# Patient Record
Sex: Male | Born: 1970 | Race: Black or African American | Hispanic: No | Smoking: Current every day smoker
Health system: Southern US, Community
[De-identification: ages and names within clinical notes are randomized; demographics above are authoritative.]

## PROBLEM LIST (undated history)

## (undated) DIAGNOSIS — K219 Gastro-esophageal reflux disease without esophagitis: Secondary | ICD-10-CM

## (undated) DIAGNOSIS — E7800 Pure hypercholesterolemia, unspecified: Secondary | ICD-10-CM

## (undated) DIAGNOSIS — Z7289 Other problems related to lifestyle: Secondary | ICD-10-CM

## (undated) DIAGNOSIS — I6529 Occlusion and stenosis of unspecified carotid artery: Secondary | ICD-10-CM

## (undated) DIAGNOSIS — I10 Essential (primary) hypertension: Secondary | ICD-10-CM

## (undated) DIAGNOSIS — I639 Cerebral infarction, unspecified: Secondary | ICD-10-CM

## (undated) DIAGNOSIS — N289 Disorder of kidney and ureter, unspecified: Secondary | ICD-10-CM

## (undated) DIAGNOSIS — Z789 Other specified health status: Secondary | ICD-10-CM

## (undated) DIAGNOSIS — Z720 Tobacco use: Secondary | ICD-10-CM

## (undated) DIAGNOSIS — R569 Unspecified convulsions: Secondary | ICD-10-CM

## (undated) DIAGNOSIS — F1090 Alcohol use, unspecified, uncomplicated: Secondary | ICD-10-CM

## (undated) HISTORY — DX: Gastro-esophageal reflux disease without esophagitis: K21.9

## (undated) HISTORY — PX: NO PAST SURGERIES: SHX2092

## (undated) HISTORY — DX: Occlusion and stenosis of unspecified carotid artery: I65.29

---

## 2017-06-29 ENCOUNTER — Emergency Department
Admission: EM | Admit: 2017-06-29 | Discharge: 2017-06-29 | Disposition: A | Discharge status: Home / Self Care | Payer: Self-pay | Attending: Emergency Medicine | Admitting: Emergency Medicine

## 2017-06-29 ENCOUNTER — Encounter: Payer: Self-pay | Admitting: Emergency Medicine

## 2017-06-29 ENCOUNTER — Emergency Department: Payer: Self-pay

## 2017-06-29 DIAGNOSIS — R519 Headache, unspecified: Secondary | ICD-10-CM

## 2017-06-29 DIAGNOSIS — R569 Unspecified convulsions: Secondary | ICD-10-CM | POA: Insufficient documentation

## 2017-06-29 DIAGNOSIS — R51 Headache: Secondary | ICD-10-CM | POA: Insufficient documentation

## 2017-06-29 HISTORY — DX: Pure hypercholesterolemia, unspecified: E78.00

## 2017-06-29 HISTORY — DX: Essential (primary) hypertension: I10

## 2017-06-29 HISTORY — DX: Unspecified convulsions: R56.9

## 2017-06-29 HISTORY — DX: Cerebral infarction, unspecified: I63.9

## 2017-06-29 LAB — BASIC METABOLIC PANEL
ANION GAP: 12 (ref 5–15)
BUN: 15 mg/dL (ref 6–20)
CHLORIDE: 102 mmol/L (ref 101–111)
CO2: 23 mmol/L (ref 22–32)
Calcium: 9.7 mg/dL (ref 8.9–10.3)
Creatinine, Ser: 1.34 mg/dL — ABNORMAL HIGH (ref 0.61–1.24)
GFR calc Af Amer: >60 mL/min (ref >60)
GFR calc non Af Amer: >60 mL/min (ref >60)
GLUCOSE: 186 mg/dL — AB (ref 65–99)
POTASSIUM: 3.7 mmol/L (ref 3.5–5.1)
Sodium: 137 mmol/L (ref 135–145)

## 2017-06-29 LAB — URINALYSIS, COMPLETE (UACMP) WITH MICROSCOPIC
BACTERIA UA: NONE SEEN
BILIRUBIN URINE: NEGATIVE
Glucose, UA: NEGATIVE mg/dL
HGB URINE DIPSTICK: NEGATIVE
Ketones, ur: NEGATIVE mg/dL
LEUKOCYTES UA: NEGATIVE
Nitrite: NEGATIVE
PROTEIN: NEGATIVE mg/dL
SPECIFIC GRAVITY, URINE: 1.019 (ref 1.005–1.030)
pH: 5 (ref 5.0–8.0)

## 2017-06-29 LAB — CBC
HEMATOCRIT: 36.7 % — AB (ref 40.0–52.0)
HEMOGLOBIN: 13.2 g/dL (ref 13.0–18.0)
MCH: 33.6 pg (ref 26.0–34.0)
MCHC: 36 g/dL (ref 32.0–36.0)
MCV: 93.3 fL (ref 80.0–100.0)
Platelets: 279 10*3/uL (ref 150–440)
RBC: 3.93 MIL/uL — ABNORMAL LOW (ref 4.40–5.90)
RDW: 14 % (ref 11.5–14.5)
WBC: 8 10*3/uL (ref 3.8–10.6)

## 2017-06-29 LAB — GLUCOSE, CAPILLARY: GLUCOSE-CAPILLARY: 186 mg/dL — AB (ref 65–99)

## 2017-06-29 LAB — TROPONIN I

## 2017-06-29 MED ORDER — SODIUM CHLORIDE 0.9 % IV BOLUS (SEPSIS)
1000.0000 mL | Freq: Once | INTRAVENOUS | Status: AC
  Administered 2017-06-29: 1000 mL via INTRAVENOUS

## 2017-06-29 MED ORDER — ASPIRIN 81 MG PO TABS: 81.0000 mg | ORAL_TABLET | Freq: Every day | ORAL | 0 refills | Status: DC

## 2017-06-29 MED ORDER — ASPIRIN 81 MG PO CHEW
81.0000 mg | CHEWABLE_TABLET | Freq: Once | ORAL | Status: AC
  Administered 2017-06-29: 81 mg via ORAL
  Filled 2017-06-29: qty 1

## 2017-06-29 MED ORDER — ASPIRIN 81 MG PO CHEW: 324.0000 mg | CHEWABLE_TABLET | Freq: Once | ORAL | Status: DC

## 2017-06-29 MED ORDER — ACETAMINOPHEN 500 MG PO TABS
1000.0000 mg | ORAL_TABLET | Freq: Once | ORAL | Status: AC
  Administered 2017-06-29: 1000 mg via ORAL
  Filled 2017-06-29: qty 2

## 2017-06-29 NOTE — ED Triage Notes (Signed)
Pt c/o increasing seizures over last 2 weeks.  having 4-5 per week, normally 1-2.  Seizures since has strokes back in October last year.  C/o intermittent headaches over last 2 weeks as well.  Has numbness to left hand since stroke.  Reports headaches as generalized. Has had syncope right after stroke but not recently. Ambulatory to triage. NAD. C/o severe fatigue and always being tired.

## 2017-06-29 NOTE — Discharge Instructions (Signed)
Increase you Keppra from 500 mg twice a day to 500mg  in the morning and  in the evening. Follow up with Neurology as soon as possible. Above there are two of our Neurologists for follow up. You must be on a baby aspirin daily for stroke prevention. Make sure to take it every day. Return to the ER for new or multiple seizures, or any signs of stroke including facial droop, slurred speech, difficulty finding words, or we kness or numbness of one side of your body.

## 2017-06-29 NOTE — ED Notes (Signed)
Pt in CT.

## 2017-06-29 NOTE — ED Provider Notes (Signed)
[facility] Emergency Department Provider Note  ____________________________________________  Time seen: Approximately 11:46 AM  I have reviewed the triage vital signs and the nursing notes.   HISTORY  Chief Complaint Headache   HPI Gary [name] is a 46 y.o. 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 Patient reports that he was diagnosed with a stroke back in 07/2016 in [place]. He has moved to [place] has not established care here. He reports that he has weekly episodes of partial seizure which he describes as drooling into the left side of the mouth and inability to control his left limbs. He reports 4-5 episodes a week however this has been ongoing since he had the stroke. No increase in the number of seizures. He has never followed with neurology. For the last 2 weeks he has been having headaches which he describes as pressure located diffusely in his head, currently 5 out of 10, constant, nonradiating. He denies new acute neurological symptoms such as facial droop, slurred speech, changes in vision, worsening LUE weakness or new unilateral weakness. Patient is not on aspirin or Plavix for stroke prevention. He does have a history of hypertension, and hyperlipidemia. He endorses compliance with his medications for those conditions.  Past Medical History:  Diagnosis Date  . Hypercholesteremia   . Hypertension   . Seizures (HCC)   . Stroke [facility])     There are no active problems to display for this patient.   History reviewed. No pertinent surgical history.  Prior to Admission medications   Medication Sig Start Date End Date Taking? Authorizing Provider  atenolol (TENORMIN) 25 MG tablet Take 25 mg by mouth daily.   Yes [provider]  levETIRAcetam (KEPPRA) 1000 MG tablet Take 1,000 mg by mouth 2 (two) times daily.   Yes [provider]    lisinopril-hydrochlorothiazide (PRINZIDE,ZESTORETIC) 20-12.5 MG tablet Take 1 tablet by mouth daily.   Yes [provider]  lovastatin (MEVACOR) 20 MG tablet Take 20 mg by mouth at bedtime.   Yes [provider]  aspirin 81 MG tablet Take 1 tablet (81 mg total) by mouth daily. 06/29/17   Nita Sickle, MD    Allergies Patient has no known allergies.  History reviewed. No pertinent family history.  Social History Social History  Substance Use Topics  . Smoking status: Current Some Day Smoker  . Smokeless tobacco: Never Used  . Alcohol use Yes    Review of Systems  Constitutional: Negative for fever. Eyes: Negative for visual changes. ENT: Negative for sore throat. Neck: No neck pain  Cardiovascular: Negative for chest pain. Respiratory: Negative for shortness of breath. Gastrointestinal: Negative for abdominal pain, vomiting or diarrhea. Genitourinary: Negative for dysuria. Musculoskeletal: Negative for back pain. Skin: Negative for rash. Neurological: Negative for weakness or numbness. + HA and seizures Psych: No SI or HI  ____________________________________________   PHYSICAL EXAM:  VITAL SIGNS: ED Triage Vitals  Enc Vitals Group     BP 06/29/17 1040 (!) 155/100     Pulse Rate 06/29/17 1040 98     Resp 06/29/17 1040 16     Temp 06/29/17 1040 98.1 F (36.7 C)     Temp Source 06/29/17 1040 Oral     SpO2 06/29/17 1040 100 %     Weight 06/29/17 1041 223 lb (101.2 kg)     Height 06/29/17 1041  (1.803 m)     Head Circumference --  Peak Flow --      Pain Score 06/29/17 1039 7     Pain Loc --      Pain Edu? --      Excl. in GC? --     Constitutional: Alert and oriented. Well appearing and in no apparent distress. HEENT:      Head: Normocephalic and atraumatic.         Eyes: Conjunctivae are normal. Sclera is non-icteric.       Mouth/Throat: Mucous membranes are moist.       Neck: Supple with no signs of  meningismus. Cardiovascular: Regular rate and rhythm. No murmurs, gallops, or rubs. 2+ symmetrical distal pulses are present in all extremities. No JVD. Respiratory: Normal respiratory effort. Lungs are clear to auscultation bilaterally. No wheezes, crackles, or rhonchi.  Gastrointestinal: Soft, non tender, and non distended with positive bowel sounds. No rebound or guarding. Genitourinary: No CVA tenderness. Musculoskeletal: Nontender with normal range of motion in all extremities. No edema, cyanosis, or erythema of extremities. Neurologic: Normal speech and language. A & O x3, PERRL, EOMI, no nystagmus, CN II-XII intact, motor testing reveals good tone and bulk throughout. There is no evidence of pronator drift or dysmetria. Muscle strength is 5/5 on the RUE, RLE and LLE, 4+/5 on LUE. Sensory examination is intact. Gait is normal. Skin: Skin is warm, dry and intact. No rash noted. Psychiatric: Mood and affect are normal. Speech and behavior are normal.  ____________________________________________   LABS (all labs ordered are listed, but only abnormal results are displayed)  Labs Reviewed  BASIC METABOLIC PANEL - Abnormal; Notable for the following:       Result Value   Glucose, Bld 186 (*)    Creatinine, Ser 1.34 (*)    All other components within normal limits  CBC - Abnormal; Notable for the following:    RBC 3.93 (*)    HCT 36.7 (*)    All other components within normal limits  URINALYSIS, COMPLETE (UACMP) WITH MICROSCOPIC - Abnormal; Notable for the following:    Color, Urine YELLOW (*)    APPearance CLEAR (*)    Squamous Epithelial / LPF 0-5 (*)    All other components within normal limits  GLUCOSE, CAPILLARY - Abnormal; Notable for the following:    Glucose-Capillary 186 (*)    All other components within normal limits  TROPONIN I  LEVETIRACETAM LEVEL  CBG MONITORING, ED   ____________________________________________  EKG  ED ECG REPORT I, Nita Sickle, the  attending physician, personally viewed and interpreted this ECG.  Normal sinus rhythm, rate of 98, normal intervals, normal axis, no ST elevations or depressions, T-wave flattening in inferior lateral leads.no prior for comparison. ____________________________________________  RADIOLOGY  Head CT:  No acute intracranial abnormality.  Old infarct. ____________________________________________   PROCEDURES  Procedure(s) performed: None Procedures Critical Care performed:  None ____________________________________________   INITIAL IMPRESSION / ASSESSMENT AND PLAN / ED COURSE  46 y.o. 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 HA have been increasing in frequency for 2 weeks. Not on blood thinners. Neuro intact other than mild weakness of the LUE which is chronic. No thunderclap HA. Will get head CT to rule out new subacute stroke since sx have been ongoing for 2 weeks. Will check Keppra levels and increase Keppra from  BID to  qAM and  qPM. Will refer to Neurology. Discuss with patient that he must be at least on a daily ASA  for stroke prevention.    _________________________ 1:28 PM on 06/29/2017 -----------------------------------------  patient remains extremely well appearing. Keppra level is pending. Patients can be referred to neurology. Discussed with patient the importance of taking a baby aspirin every day for stroke prevention. Remaining of labs and UA with no acute findings. Recommended he return to the emergency room if patient has new seizures, more frequent seizures, or any signs or symptoms of stroke. I'll increase patient's Keppra per above.  Pertinent labs & imaging results that were available during my care of the patient were reviewed by me and considered in my medical decision making (see chart for details).    ____________________________________________   FINAL CLINICAL  IMPRESSION(S) / ED DIAGNOSES  Final diagnoses:  Acute nonintractable headache, unspecified headache type  Partial seizures (HCC)      NEW MEDICATIONS STARTED DURING THIS VISIT:  New Prescriptions   ASPIRIN 81 MG TABLET    Take 1 tablet (81 mg total) by mouth daily.     Note:  This document was prepared using Dragon voice recognition software and may include unintentional dictation errors.    Don Perking, [place], MD 06/29/17 1330

## 2017-07-02 LAB — LEVETIRACETAM LEVEL: LEVETIRACETAM: NOT DETECTED ug/mL (ref 10.0–40.0)

## 2017-10-22 ENCOUNTER — Ambulatory Visit: Payer: Self-pay

## 2017-10-24 ENCOUNTER — Ambulatory Visit: Payer: Self-pay | Admitting: Family Medicine

## 2017-10-24 VITALS — BP 134/97 | HR 92 | Temp 97.8°F | Wt 238.8 lb

## 2017-10-24 DIAGNOSIS — Z87898 Personal history of other specified conditions: Secondary | ICD-10-CM

## 2017-10-24 DIAGNOSIS — K6289 Other specified diseases of anus and rectum: Secondary | ICD-10-CM

## 2017-10-24 DIAGNOSIS — R42 Dizziness and giddiness: Secondary | ICD-10-CM

## 2017-10-24 DIAGNOSIS — R531 Weakness: Secondary | ICD-10-CM

## 2017-10-24 DIAGNOSIS — R0602 Shortness of breath: Secondary | ICD-10-CM

## 2017-10-24 DIAGNOSIS — R6882 Decreased libido: Secondary | ICD-10-CM

## 2017-10-24 DIAGNOSIS — R51 Headache: Secondary | ICD-10-CM

## 2017-10-24 DIAGNOSIS — E785 Hyperlipidemia, unspecified: Secondary | ICD-10-CM

## 2017-10-24 DIAGNOSIS — R599 Enlarged lymph nodes, unspecified: Secondary | ICD-10-CM

## 2017-10-24 DIAGNOSIS — I10 Essential (primary) hypertension: Secondary | ICD-10-CM

## 2017-10-24 DIAGNOSIS — R55 Syncope and collapse: Secondary | ICD-10-CM

## 2017-10-24 DIAGNOSIS — Z09 Encounter for follow-up examination after completed treatment for conditions other than malignant neoplasm: Secondary | ICD-10-CM

## 2017-10-24 DIAGNOSIS — R519 Headache, unspecified: Secondary | ICD-10-CM

## 2017-10-24 DIAGNOSIS — K219 Gastro-esophageal reflux disease without esophagitis: Secondary | ICD-10-CM

## 2017-10-24 MED ORDER — OMEPRAZOLE 20 MG PO CPDR: 20.0000 mg | DELAYED_RELEASE_CAPSULE | Freq: Every day | ORAL | 0 refills | Status: DC

## 2017-10-24 NOTE — Progress Notes (Signed)
  Patient: Gary [name] Male    DOB: 1970/11/27   47 y.o.   MRN: [id number] Visit Date: 10/24/2017  Today's Provider: [facility]   Chief Complaint  Patient presents with  . Establish Care  . Pain    has stroke symptoms   Subjective:   HPI  Patient is here today to establish care with PCP.   No Known Allergies Previous Medications   ASPIRIN 81 MG TABLET    Take 1 tablet (81 mg total) by mouth daily.   ATENOLOL (TENORMIN) 25 MG TABLET    Take 25 mg by mouth daily.   LEVETIRACETAM (KEPPRA) 1000 MG TABLET    Take 1,000 mg by mouth 2 (two) times daily.   LISINOPRIL-HYDROCHLOROTHIAZIDE (PRINZIDE,ZESTORETIC) 20-12.5 MG TABLET    Take 1 tablet by mouth daily.   LOVASTATIN (MEVACOR) 20 MG TABLET    Take 20 mg by mouth at bedtime.    Review of Systems  Social History   Tobacco Use  . Smoking status: Current Some Day Smoker    Packs/day: 0.10  . Smokeless tobacco: Never Used  Substance Use Topics  . Alcohol use: Yes    Comment: occasionally   Objective:   BP (!) 134/97 (BP Location: Left Arm, Patient Position: Sitting, Cuff Size: Large)   Pulse 92   Temp 97.8 F (36.6 C)   Wt 238 lb 12.8 oz (108.3 kg)   BMI 33.31 kg/m   Physical Exam     Assessment & Plan:   1. History of seizures Stable. Continue Keppra as directed.  - CBC; Future - Comprehensive metabolic panel; Future - TSH - HgB A1c  2. Left-sided weakness Referral to Neurology. - CBC; Future  3. Dizziness Probable from history of stroke. Counseled to ambulate slowly. Neurology referral.  - CBC; Future  4. Shortness of breath - CBC; Future  5. Essential hypertension BP is 134/97 today. Continue Zestoretic and Atenolol as directed.  - CBC; Future  6. Hyperlipidemia, unspecified hyperlipidemia type Continue Mevacor as directed. - Lipid Profile  7. Bilateral headaches Probable secondary to seizures. Continue OTC meds for headache as needed. Referral to Neurology.  8.  Gastroesophageal reflux disease, esophagitis presence not specified Rx for Prilosec to pharmacy.  9. Syncope, unspecified syncope type See # 3.   10. Rectal discomfort Referral to GI. - PS/A  11. Decreased libido Monitor.  12. Swollen lymph nodes Will re-assess at next appointment. Monitor.  13. Follow up Follow up in 2 weeks for OV and to review labs.     [facility]   [facility]

## 2017-10-31 ENCOUNTER — Ambulatory Visit: Payer: Self-pay | Admitting: Ophthalmology

## 2017-11-01 ENCOUNTER — Ambulatory Visit: Payer: Self-pay | Admitting: Pharmacy Technician

## 2017-11-01 ENCOUNTER — Other Ambulatory Visit: Payer: Self-pay

## 2017-11-01 DIAGNOSIS — I152 Hypertension secondary to endocrine disorders: Secondary | ICD-10-CM

## 2017-11-01 DIAGNOSIS — R569 Unspecified convulsions: Secondary | ICD-10-CM

## 2017-11-01 DIAGNOSIS — I10 Essential (primary) hypertension: Secondary | ICD-10-CM

## 2017-11-01 DIAGNOSIS — Z79899 Other long term (current) drug therapy: Secondary | ICD-10-CM

## 2017-11-01 DIAGNOSIS — E1159 Type 2 diabetes mellitus with other circulatory complications: Secondary | ICD-10-CM

## 2017-11-01 MED ORDER — LOVASTATIN 20 MG PO TABS: 20.0000 mg | ORAL_TABLET | Freq: Every day | ORAL | 6 refills | Status: DC

## 2017-11-01 MED ORDER — LEVETIRACETAM 1000 MG PO TABS: 1000.0000 mg | ORAL_TABLET | Freq: Two times a day (BID) | ORAL | 6 refills | Status: DC

## 2017-11-01 MED ORDER — LISINOPRIL-HYDROCHLOROTHIAZIDE 20-12.5 MG PO TABS: 1.0000 | ORAL_TABLET | Freq: Every day | ORAL | 6 refills | Status: DC

## 2017-11-01 MED ORDER — ASPIRIN 81 MG PO TABS: 81.0000 mg | ORAL_TABLET | Freq: Every day | ORAL | 0 refills | Status: DC

## 2017-11-01 MED ORDER — ATENOLOL 25 MG PO TABS: 25.0000 mg | ORAL_TABLET | Freq: Every day | ORAL | 6 refills | Status: DC

## 2017-11-01 NOTE — Progress Notes (Signed)
Completed [facility] application and contract.  Patient agreed to all terms of the [facility] contract.    Patient approved to receive medication assistance at [facility] through 2019, as long as eligibility criteria continues to be met.    Provided patient with Civil engineer, contracting based on his particular needs.    Patient inquiring about [facility] filling medications for Aspirin, Atenolol, Keppra Lovastatin & Lisinopril-HCTZ.  Explained that we needed prescriptions.  Requested prescriptions from [facility].  Referred patient for MTM.  [facility]

## 2017-11-07 ENCOUNTER — Other Ambulatory Visit: Payer: Self-pay

## 2017-11-07 ENCOUNTER — Encounter: Payer: Self-pay | Admitting: Adult Health Nurse Practitioner

## 2017-11-07 ENCOUNTER — Ambulatory Visit: Payer: Medicaid Other | Admitting: Adult Health Nurse Practitioner

## 2017-11-07 VITALS — BP 155/94 | HR 82 | Temp 98.3°F | Wt 240.6 lb

## 2017-11-07 DIAGNOSIS — I10 Essential (primary) hypertension: Secondary | ICD-10-CM

## 2017-11-07 DIAGNOSIS — E1159 Type 2 diabetes mellitus with other circulatory complications: Secondary | ICD-10-CM

## 2017-11-07 DIAGNOSIS — K122 Cellulitis and abscess of mouth: Secondary | ICD-10-CM | POA: Insufficient documentation

## 2017-11-07 DIAGNOSIS — R569 Unspecified convulsions: Secondary | ICD-10-CM

## 2017-11-07 DIAGNOSIS — E785 Hyperlipidemia, unspecified: Secondary | ICD-10-CM | POA: Insufficient documentation

## 2017-11-07 DIAGNOSIS — R42 Dizziness and giddiness: Secondary | ICD-10-CM

## 2017-11-07 DIAGNOSIS — Z87898 Personal history of other specified conditions: Secondary | ICD-10-CM

## 2017-11-07 DIAGNOSIS — R0602 Shortness of breath: Secondary | ICD-10-CM

## 2017-11-07 DIAGNOSIS — R531 Weakness: Secondary | ICD-10-CM

## 2017-11-07 DIAGNOSIS — Z8673 Personal history of transient ischemic attack (TIA), and cerebral infarction without residual deficits: Secondary | ICD-10-CM

## 2017-11-07 MED ORDER — ATENOLOL 50 MG PO TABS: 50.0000 mg | ORAL_TABLET | Freq: Every day | ORAL | 3 refills | Status: DC

## 2017-11-07 MED ORDER — ATORVASTATIN CALCIUM 20 MG PO TABS
20.0000 mg | ORAL_TABLET | Freq: Every day | ORAL | 3 refills | Status: DC
Start: 2017-11-07 — End: 2018-11-19

## 2017-11-07 MED ORDER — OMEPRAZOLE 20 MG PO CPDR: 20.0000 mg | DELAYED_RELEASE_CAPSULE | Freq: Every day | ORAL | 0 refills | Status: DC

## 2017-11-07 MED ORDER — LISINOPRIL-HYDROCHLOROTHIAZIDE 20-12.5 MG PO TABS: 1.0000 | ORAL_TABLET | Freq: Every day | ORAL | 6 refills | Status: DC

## 2017-11-07 MED ORDER — LEVETIRACETAM 1000 MG PO TABS: 1000.0000 mg | ORAL_TABLET | Freq: Two times a day (BID) | ORAL | 6 refills | Status: DC

## 2017-11-07 NOTE — Addendum Note (Signed)
Addended by: Laray Corbit D on: 11/07/2017 06:18 PM   Modules accepted: Orders  

## 2017-11-07 NOTE — Addendum Note (Signed)
Addended by: Dustin FlockMAYNOR, Takeela Peil D on: 11/07/2017 06:18 PM   Modules accepted: Orders

## 2017-11-07 NOTE — Progress Notes (Signed)
  Patient: Gary [name] Male    DOB: Oct 18, 1970   47 y.o.   MRN: [id number] Visit Date: 11/07/2017  Today's Provider: Jacelyn Pieah Doles-Johnson, NP   No chief complaint on file.  Subjective:    HPI  Pt has hx of 3 stokes in Oct 2017. None since 2017.  Has residual numbness to his left hand.  Has chronic pain in his head, legs and back- unable to maintain one position for long periods of time.  Spends most of his time lying down.  No recent stroke symptoms.   Pt states that he has small seizures- last one a month ago.  Pt states that during his seizures he loses control of his mouth and L hand- feels as if he is going to pass out- he has passed out before.     No Known Allergies Previous Medications   ASPIRIN 81 MG TABLET    Take 1 tablet (81 mg total) by mouth daily.   ATENOLOL (TENORMIN) 25 MG TABLET    Take 1 tablet (25 mg total) by mouth daily.   LEVETIRACETAM (KEPPRA) 1000 MG TABLET    Take 1 tablet (1,000 mg total) by mouth 2 (two) times daily.   LISINOPRIL-HYDROCHLOROTHIAZIDE (PRINZIDE,ZESTORETIC) 20-12.5 MG TABLET    Take 1 tablet by mouth daily.   OMEPRAZOLE (PRILOSEC) 20 MG CAPSULE    Take 1 capsule (20 mg total) by mouth daily.    Review of Systems  All other systems reviewed and are negative.   Social History   Tobacco Use  . Smoking status: Current Some Day Smoker    Packs/day: 0.10  . Smokeless tobacco: Never Used  Substance Use Topics  . Alcohol use: Yes    Comment: occasionally   Objective:   BP (!) 155/94   Pulse 82   Temp 98.3 F (36.8 C)   Wt 240 lb 9.6 oz (109.1 kg)   BMI 33.56 kg/m   Physical Exam  Constitutional: He is oriented to person, place, and time. He appears well-developed and well-nourished.  HENT:  Head: Normocephalic and atraumatic.  Eyes: Pupils are equal, round, and reactive to light.  Neck: Normal range of motion. Neck supple.  Cardiovascular: Normal rate, regular rhythm and normal heart sounds.  Pulmonary/Chest: Effort normal  and breath sounds normal.  Abdominal: Soft. Bowel sounds are normal.  Lymphadenopathy:       Head (right side): Submandibular adenopathy present.  Neurological: He is alert and oriented to person, place, and time.  Skin: Skin is warm and dry.  Vitals reviewed.       Assessment & Plan:        Seizures/Hx CVA:  Pending Neurology referral. [facility].  Continue Keppra for seizures.  Reviewed s/s stroke.   HTN:  Not controlled.  Goal BP <140/90.  Increase atenolol to 50mg  daily.  Encourage low salt diet and exercise.  FU in 3 weeks for BP check.   Submandibular abcess:  Order ultrasound.   Plan to speak with Carollee HerterShannon in regards to ED.  May be able to develop a natural plan.   Information given on neck exercises.   Jacelyn Pieah Doles-Johnson, NP   [facility]

## 2017-11-07 NOTE — Patient Instructions (Signed)
Neck Exercises Neck exercises can be important for many reasons:  They can help you to improve and maintain flexibility in your neck. This can be especially important as you age.  They can help to make your neck stronger. This can make movement easier.  They can reduce or prevent neck pain.  They may help your upper back.  Ask your health care provider which neck exercises would be best for you. Exercises Neck Press Repeat this exercise 10 times. Do it first thing in the morning and right before bed or as told by your health care provider. 1. Lie on your back on a firm bed or on the floor with a pillow under your head. 2. Use your neck muscles to push your head down on the pillow and straighten your spine. 3. Hold the position as well as you can. Keep your head facing up and your chin tucked. 4. Slowly count to 5 while holding this position. 5. Relax for a few seconds. Then repeat.  Isometric Strengthening Do a full set of these exercises 2 times a day or as told by your health care provider. 1. Sit in a supportive chair and place your hand on your forehead. 2. Push forward with your head and neck while pushing back with your hand. Hold for 10 seconds. 3. Relax. Then repeat the exercise 3 times. 4. Next, do thesequence again, this time putting your hand against the back of your head. Use your head and neck to push backward against the hand pressure. 5. Finally, do the same exercise on either side of your head, pushing sideways against the pressure of your hand.  Prone Head Lifts Repeat this exercise 5 times. Do this 2 times a day or as told by your health care provider. 1. Lie face-down, resting on your elbows so that your chest and upper back are raised. 2. Start with your head facing downward, near your chest. Position your chin either on or near your chest. 3. Slowly lift your head upward. Lift until you are looking straight ahead. Then continue lifting your head as far back as  you can stretch. 4. Hold your head up for 5 seconds. Then slowly lower it to your starting position.  Supine Head Lifts Repeat this exercise 8-10 times. Do this 2 times a day or as told by your health care provider. 1. Lie on your back, bending your knees to point to the ceiling and keeping your feet flat on the floor. 2. Lift your head slowly off the floor, raising your chin toward your chest. 3. Hold for 5 seconds. 4. Relax and repeat.  Scapular Retraction Repeat this exercise 5 times. Do this 2 times a day or as told by your health care provider. 1. Stand with your arms at your sides. Look straight ahead. 2. Slowly pull both shoulders backward and downward until you feel a stretch between your shoulder blades in your upper back. 3. Hold for 10-30 seconds. 4. Relax and repeat.  Contact a health care provider if:  Your neck pain or discomfort gets much worse when you do an exercise.  Your neck pain or discomfort does not improve within 2 hours after you exercise. If you have any of these problems, stop exercising right away. Do not do the exercises again unless your health care provider says that you can. Get help right away if:  You develop sudden, severe neck pain. If this happens, stop exercising right away. Do not do the exercises again unless your   health care provider says that you can. Exercises Neck Stretch  Repeat this exercise 3-5 times. 1. Do this exercise while standing or while sitting in a chair. 2. Place your feet flat on the floor, shoulder-width apart. 3. Slowly turn your head to the right. Turn it all the way to the right so you can look over your right shoulder. Do not tilt or tip your head. 4. Hold this position for 10-30 seconds. 5. Slowly turn your head to the left, to look over your left shoulder. 6. Hold this position for 10-30 seconds.  Neck Retraction Repeat this exercise 8-10 times. Do this 3-4 times a day or as told by your health care  provider. 1. Do this exercise while standing or while sitting in a sturdy chair. 2. Look straight ahead. Do not bend your neck. 3. Use your fingers to push your chin backward. Do not bend your neck for this movement. Continue to face straight ahead. If you are doing the exercise properly, you will feel a slight sensation in your throat and a stretch at the back of your neck. 4. Hold the stretch for 1-2 seconds. Relax and repeat.  This information is not intended to replace advice given to you by your health care provider. Make sure you discuss any questions you have with your health care provider. Document Released: 09/05/2015 Document Revised: 03/01/2016 Document Reviewed: 04/04/2015 Elsevier Interactive Patient Education  2018 [employer]vier Inc[employer]  

## 2017-11-10 LAB — COMPREHENSIVE METABOLIC PANEL
ALT: 49 IU/L — AB (ref 0–44)
AST: 29 IU/L (ref 0–40)
Albumin/Globulin Ratio: 1.4 (ref 1.2–2.2)
Albumin: 4.6 g/dL (ref 3.5–5.5)
Alkaline Phosphatase: 97 IU/L (ref 39–117)
BILIRUBIN TOTAL: 0.2 mg/dL (ref 0.0–1.2)
BUN/Creatinine Ratio: 15 (ref 9–20)
BUN: 21 mg/dL (ref 6–24)
CALCIUM: 10 mg/dL (ref 8.7–10.2)
CHLORIDE: 100 mmol/L (ref 96–106)
CO2: 25 mmol/L (ref 20–29)
Creatinine, Ser: 1.38 mg/dL — ABNORMAL HIGH (ref 0.76–1.27)
GFR calc Af Amer: 70 mL/min/{1.73_m2} (ref >59)
GFR calc non Af Amer: 61 mL/min/{1.73_m2} (ref >59)
GLUCOSE: 116 mg/dL — AB (ref 65–99)
Globulin, Total: 3.3 g/dL (ref 1.5–4.5)
Potassium: 4.5 mmol/L (ref 3.5–5.2)
Sodium: 142 mmol/L (ref 134–144)
TOTAL PROTEIN: 7.9 g/dL (ref 6.0–8.5)

## 2017-11-10 LAB — CBC WITH DIFFERENTIAL
Basophils Absolute: 0 10*3/uL (ref 0.0–0.2)
Basos: 0 %
EOS (ABSOLUTE): 0.1 10*3/uL (ref 0.0–0.4)
EOS: 1 %
HEMATOCRIT: 41.6 % (ref 37.5–51.0)
HEMOGLOBIN: 13.5 g/dL (ref 13.0–17.7)
IMMATURE GRANULOCYTES: 0 %
Immature Grans (Abs): 0 10*3/uL (ref 0.0–0.1)
LYMPHS ABS: 4.3 10*3/uL — AB (ref 0.7–3.1)
Lymphs: 51 %
MCH: 31 pg (ref 26.6–33.0)
MCHC: 32.5 g/dL (ref 31.5–35.7)
MCV: 96 fL (ref 79–97)
MONOCYTES: 9 %
Monocytes Absolute: 0.8 10*3/uL (ref 0.1–0.9)
Neutrophils Absolute: 3.2 10*3/uL (ref 1.4–7.0)
Neutrophils: 39 %
RBC: 4.35 x10E6/uL (ref 4.14–5.80)
RDW: 14 % (ref 12.3–15.4)
WBC: 8.4 10*3/uL (ref 3.4–10.8)

## 2017-11-10 LAB — LEVETIRACETAM LEVEL: LEVETIRACETAM: 2.5 ug/mL — AB (ref 10.0–40.0)

## 2017-12-03 NOTE — Progress Notes (Signed)
Will review results with patient at next OV.

## 2017-12-03 NOTE — Progress Notes (Signed)
Will review with patient on next appointment on 2/272019

## 2017-12-03 NOTE — Progress Notes (Signed)
To reassess at next OV

## 2017-12-04 ENCOUNTER — Ambulatory Visit: Payer: Medicaid Other | Admitting: Internal Medicine

## 2017-12-04 ENCOUNTER — Telehealth: Payer: Self-pay

## 2017-12-04 ENCOUNTER — Encounter: Payer: Self-pay | Admitting: Internal Medicine

## 2017-12-04 VITALS — BP 115/79 | HR 90 | Temp 98.6°F | Wt 239.4 lb

## 2017-12-04 DIAGNOSIS — Z8673 Personal history of transient ischemic attack (TIA), and cerebral infarction without residual deficits: Secondary | ICD-10-CM

## 2017-12-04 NOTE — Progress Notes (Signed)
   Subjective:    Patient ID: Gary [name], male    DOB: 07-10-1971, 47 y.o.   MRN: [id number]  HPI  Pt is here for a f/u on labs. And responce to new BP Rx: atenolol  Pt's last stroke was in October 2017 due to a blood clot in his brain (no bleeding found).  Pt was having abdominal and lower back pain. He thinks its related to his medication.   Pt is still having erectile dysfunction that happened after had a stroke.  Allergies as of 12/04/2017   No Known Allergies     Medication List        Accurate as of 12/04/17 11:34 AM. Always use your most recent med list.          aspirin 81 MG tablet Take 1 tablet (81 mg total) by mouth daily.   atenolol 50 MG tablet Commonly known as:  TENORMIN Take 1 tablet (50 mg total) by mouth daily.   atorvastatin 20 MG tablet Commonly known as:  LIPITOR Take 1 tablet (20 mg total) by mouth daily.   levETIRAcetam 1000 MG tablet Commonly known as:  KEPPRA Take 1 tablet (1,000 mg total) by mouth 2 (two) times daily.   lisinopril-hydrochlorothiazide 20-12.5 MG tablet Commonly known as:  PRINZIDE,ZESTORETIC Take 1 tablet by mouth daily.   omeprazole 20 MG capsule Commonly known as:  PRILOSEC Take 1 capsule (20 mg total) by mouth daily.      Patient Active Problem List   Diagnosis Date Noted  . Hypertension 11/07/2017  . Hyperlipidemia 11/07/2017  . Seizures (HCC) 11/07/2017  . History of stroke 11/07/2017  . Submandibular abscess 11/07/2017     Review of Systems     Objective:   Physical Exam  Constitutional: He is oriented to person, place, and time.  Cardiovascular: Normal rate, regular rhythm and normal heart sounds.  Pulmonary/Chest: Effort normal and breath sounds normal.  Neurological: He is alert and oriented to person, place, and time.   BP 115/79   Pulse 90   Temp 98.6 F (37 C)   Wt 239 lb 6.4 oz (108.6 kg)   BMI 33.39 kg/m      Assessment & Plan:   Pt has a neurology appointment in April. An  ultrasound of his neck was ordered in Jan 2019.   F/u in 3 months with labs a week prior.  Refer pt for a consultation with urologist for erectile dysfunction.

## 2017-12-05 LAB — LIPID PANEL
Chol/HDL Ratio: 7.4 ratio — ABNORMAL HIGH (ref 0.0–5.0)
Cholesterol, Total: 215 mg/dL — ABNORMAL HIGH (ref 100–199)
HDL: 29 mg/dL — AB (ref >39)
LDL Calculated: 146 mg/dL — ABNORMAL HIGH (ref 0–99)
Triglycerides: 198 mg/dL — ABNORMAL HIGH (ref 0–149)
VLDL Cholesterol Cal: 40 mg/dL (ref 5–40)

## 2017-12-05 LAB — TSH: TSH: 0.532 u[IU]/mL (ref 0.450–4.500)

## 2017-12-05 LAB — HEMOGLOBIN A1C
ESTIMATED AVERAGE GLUCOSE: 126 mg/dL
HEMOGLOBIN A1C: 6 % — AB (ref 4.8–5.6)

## 2017-12-05 NOTE — Telephone Encounter (Signed)
Called to check on Gary [name] and got it scheduled for patient.

## 2017-12-10 ENCOUNTER — Ambulatory Visit: Payer: Self-pay

## 2017-12-11 ENCOUNTER — Ambulatory Visit
Admission: RE | Admit: 2017-12-11 | Discharge: 2017-12-11 | Disposition: A | Discharge status: Home / Self Care | Payer: Self-pay | Source: Ambulatory Visit | Attending: Adult Health Nurse Practitioner | Admitting: Adult Health Nurse Practitioner

## 2017-12-11 DIAGNOSIS — R590 Localized enlarged lymph nodes: Secondary | ICD-10-CM | POA: Insufficient documentation

## 2017-12-11 DIAGNOSIS — K122 Cellulitis and abscess of mouth: Secondary | ICD-10-CM | POA: Insufficient documentation

## 2017-12-12 ENCOUNTER — Telehealth: Payer: Self-pay

## 2017-12-12 NOTE — Telephone Encounter (Signed)
-----   Message from Lewie LoronMagadalene S Tukov, NP sent at 12/12/2017 10:59 AM EST ----- Please call patient and let him know his ultrasound was negative for an abscess.

## 2017-12-12 NOTE — Telephone Encounter (Signed)
Gave pt negative results of U/S. He will call back with any changes and f/u at next appt

## 2017-12-24 ENCOUNTER — Other Ambulatory Visit: Payer: Self-pay | Admitting: Adult Health Nurse Practitioner

## 2018-01-06 ENCOUNTER — Ambulatory Visit (INDEPENDENT_AMBULATORY_CARE_PROVIDER_SITE_OTHER): Payer: Self-pay | Admitting: Diagnostic Neuroimaging

## 2018-01-06 ENCOUNTER — Encounter: Payer: Self-pay | Admitting: Diagnostic Neuroimaging

## 2018-01-06 DIAGNOSIS — R569 Unspecified convulsions: Secondary | ICD-10-CM

## 2018-01-06 MED ORDER — LEVETIRACETAM 1000 MG PO TABS: 1000.0000 mg | ORAL_TABLET | Freq: Two times a day (BID) | ORAL | 12 refills | Status: DC

## 2018-01-06 NOTE — Progress Notes (Signed)
[facility]  PATIENT: Gary [name] DOB: March 22, 1971  REFERRING CLINICIAN: Doles-Johnson HISTORY FROM: patient  REASON FOR VISIT: new consult    HISTORICAL  CHIEF COMPLAINT:  Chief Complaint  Patient presents with  . 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 Referral    Doles-Johnson, Teah, NP  . Room 7    He is here with his friend     HISTORY OF PRESENT ILLNESS:   47 year old male here for evaluation of stroke, seizure, headache, fatigue.  In 2017 patient developed left-sided numbness and headache, went to the hospital and was diagnosed with stroke.  One week later he returned to the hospital after passing out, drooling, left-sided weakness and was diagnosed with seizure.  He restarted antiseizure medication.  Last seizure event was approximately 2-3 weeks ago.  Patient also having intermittent lightheadedness and dizziness.  Patient also having depression, decreased energy, fatigue.  Patient is currently applying for Social Security disability.  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  He does not feel like he could do his prior job.   REVIEW OF SYSTEMS: Full 14 system review of systems performed and negative with exception of: Sleepiness memory loss confusion headache numbness weakness dizziness passing out not enough sleep joint pain aching muscles increased or shortness of breath cough blurred vision large lymph nodes disinterest activities decreased energy.   ALLERGIES: No Known Allergies  HOME MEDICATIONS: Outpatient Medications Prior to Visit  Medication Sig Dispense Refill  . aspirin 81 MG tablet Take 1 tablet (81 mg total) by mouth daily. 30 tablet 0  . atenolol (TENORMIN) 50 MG tablet Take 1 tablet (50 mg total) by mouth daily. 30 tablet 3  . atorvastatin (LIPITOR) 20 MG tablet Take 1 tablet (20 mg total) by mouth daily. 30 tablet 3  . levETIRAcetam  (KEPPRA) 1000 MG tablet Take 1 tablet (1,000 mg total) by mouth 2 (two) times daily. 60 tablet 6  . lisinopril-hydrochlorothiazide (PRINZIDE,ZESTORETIC) 20-12.5 MG tablet Take 1 tablet by mouth daily. 30 tablet 6  . omeprazole (PRILOSEC) 20 MG capsule TAKE ONE CAPSULE BY MOUTH EVERY DAY 30 capsule 0   No facility-administered medications prior to visit.     PAST MEDICAL HISTORY: Past Medical History:  Diagnosis Date  . Hypercholesteremia   . Hypertension   . Seizures (HCC)   . Stroke [facility])     PAST SURGICAL HISTORY: History reviewed. No pertinent surgical history.  FAMILY HISTORY: History reviewed. No pertinent family history.  SOCIAL HISTORY:  Social History   Socioeconomic History  . Marital status: Divorced    Spouse name: Not on file  . Number of children: 4  . Years of education: Not on file  . Highest education level: High school graduate  Occupational History  . Not on file  Social Needs  . Financial resource strain: Not on file  . Food insecurity:    Worry: Not on file    Inability: Not on file  . Transportation needs:    Medical: Not on file    Non-medical: Not on file  Tobacco Use  . Smoking status: Current Some Day Smoker    Packs/day: 0.10  . Smokeless tobacco: Never Used  Substance and Sexual Activity  . Alcohol use: Yes    Comment: occasionally  . Drug use: No  . Sexual activity: Yes    Birth control/protection: None  Lifestyle  . Physical activity:  Days per week: Not on file    Minutes per session: Not on file  . Stress: Not on file  Relationships  . Social connections:    Talks on phone: Not on file    Gets together: Not on file    Attends religious service: Not on file    Active member of club or organization: Not on file    Attends meetings of clubs or organizations: Not on file    Relationship status: Not on file  . Intimate partner violence:    Fear of current or ex partner: Not on file    Emotionally abused: Not on file     Physically abused: Not on file    Forced sexual activity: Not on file  Other Topics Concern  . Not on file  Social History Narrative   Lives at home with his aunt   Right handed   Drinks no caffeine     PHYSICAL EXAM  GENERAL EXAM/CONSTITUTIONAL: Vitals:  Vitals:   01/06/18 0804  BP: (!) 145/90  Pulse: 76  Weight: 237 lb (107.5 kg)  Height: 5\' 10"  (1.778 m)     Body mass index is 34.01 kg/m.  No exam data present  Patient is in no distress; well developed, nourished and groomed; neck is supple  CARDIOVASCULAR:  Examination of carotid arteries is normal; no carotid bruits  Regular rate and rhythm, no murmurs  Examination of peripheral vascular system by observation and palpation is normal  EYES:  Ophthalmoscopic exam of optic discs and posterior segments is normal; no papilledema or hemorrhages  MUSCULOSKELETAL:  Gait, strength, tone, movements noted in Neurologic exam below  NEUROLOGIC: MENTAL STATUS:  No flowsheet data found.  awake, alert, oriented to person, place and time  recent and remote memory intact  normal attention and concentration  language fluent, comprehension intact, naming intact,   fund of knowledge appropriate  CRANIAL NERVE:   2nd - no papilledema on fundoscopic exam  2nd, 3rd, 4th, 6th - pupils equal and reactive to light, visual fields full to confrontation, extraocular muscles intact, no nystagmus  5th - facial sensation symmetric  7th - facial strength symmetric  8th - hearing intact  9th - palate elevates symmetrically, uvula midline  11th - shoulder shrug symmetric  12th - tongue protrusion midline  MOTOR:   normal bulk and tone, full strength in the BUE, BLE; SLIGHT GIVE WAY STRENGTH IN LEFT ARM AND LEFT LEG  SENSORY:   normal and symmetric to light touch; DECR IN LEFT ARM AND LEG  COORDINATION:   finger-nose-finger, fine finger movements normal  REFLEXES:   deep tendon reflexes TRACE and  symmetric  GAIT/STATION:   narrow based gait; romberg is negative    DIAGNOSTIC DATA (LABS, IMAGING, TESTING) - I reviewed patient records, labs, notes, testing and imaging myself where available.  Lab Results  Component Value Date   WBC 8.4 11/07/2017   HGB 13.5 11/07/2017   HCT 41.6 11/07/2017   MCV 96 11/07/2017   PLT 279 06/29/2017      Component Value Date/Time   NA 142 11/07/2017 1826   K 4.5 11/07/2017 1826   CL 100 11/07/2017 1826   CO2 25 11/07/2017 1826   GLUCOSE 116 (H) 11/07/2017 1826   GLUCOSE 186 (H) 06/29/2017 1045   BUN 21 11/07/2017 1826   CREATININE 1.38 (H) 11/07/2017 1826   CALCIUM 10.0 11/07/2017 1826   PROT 7.9 11/07/2017 1826   ALBUMIN 4.6 11/07/2017 1826   AST 29 11/07/2017  1826   ALT 49 (H) 11/07/2017 1826   ALKPHOS 97 11/07/2017 1826   BILITOT 0.2 11/07/2017 1826   GFRNONAA 61 11/07/2017 1826   GFRAA 70 11/07/2017 1826   Lab Results  Component Value Date   CHOL 215 (H) 12/04/2017   HDL 29 (L) 12/04/2017   LDLCALC 146 (H) 12/04/2017   TRIG 198 (H) 12/04/2017   CHOLHDL 7.4 (H) 12/04/2017   Lab Results  Component Value Date   HGBA1C 6.0 (H) 12/04/2017   No results found for: [id number] Lab Results  Component Value Date   TSH 0.532 12/04/2017    CT head  -      ASSESSMENT AND PLAN  47 y.o. year old male here with hypertension, hypercholesteremia, stroke in 2017, seizure disorder, here for evaluation.   Dx:  1. Seizures (HCC)      PLAN:  STROKE PREVENTION - continue aspirin 81mg  daily - continue BP control (goal SBP <= 120) - continue statin (goal LDL < 70) - nutrition and exercise reviewed  SEIZURE PREVENTION (partial and generalized seizures; last event March 2019) - increase levetiracetam to 1000mg  twice a day  - According to [facility] law, you can not drive unless you are seizure / syncope free for at least 6 months and under physician's care.   - Please maintain precautions. Do not participate in activities  where a loss of awareness could harm you or someone else. No swimming alone, no tub bathing, no hot tubs, no driving, no operating motorized vehicles (cars, ATVs, motocycles, etc), lawnmowers, power tools or firearms. No standing at heights, such as rooftops, ladders or stairs. Avoid hot objects such as stoves, heaters, open fires. Wear a helmet when riding a bicycle, scooter, skateboard, etc. and avoid areas of traffic. Set your water heater to 120 degrees or less.  LIGHTHEADEDNESS / DIZZINESS / HEADACHES - monitor BP control  WITNESSED APNEA / HYPERTENSION - check sleep study (rule out OSA; witness apnea; interrupted sleep)  DEPRESSION / ANXIETY / INSOMNIA / FATIGUE - refer to psychology  ERECTILE DYSFUNCTION - optimize mood / depression treatment - may consider trial of ED medication per PCP   Orders Placed This Encounter  Procedures  . Ambulatory referral to Sleep Studies  . Ambulatory referral to Psychology   Meds ordered this encounter  Medications  . levETIRAcetam (KEPPRA) 1000 MG tablet    Sig: Take 1 tablet (1,000 mg total) by mouth 2 (two) times daily.    Dispense:  60 tablet    Refill:  12   Return in about 6 months (around 07/08/2018).    Suanne Marker, MD 01/06/2018, 8:52 AM Certified in Neurology, Neurophysiology and Neuroimaging  [facility] [address] [place], [place] [address] [phone] a

## 2018-01-06 NOTE — Patient Instructions (Signed)
STROKE PREVENTION - continue aspirin 81mg  daily - continue BP control (goal SBP <= 120) - continue statin (goal LDL < 70) - improve nutrition and exercise  SEIZURE PREVENTION (last event March 2019) - increase levetiracetam to 1000mg  twice a day  - According to [facility] law, you can not drive unless you are seizure / syncope free for at least 6 months and under physician's care.   - Please maintain precautions. Do not participate in activities where a loss of awareness could harm you or someone else. No swimming alone, no tub bathing, no hot tubs, no driving, no operating motorized vehicles (cars, ATVs, motocycles, etc), lawnmowers, power tools or firearms. No standing at heights, such as rooftops, ladders or stairs. Avoid hot objects such as stoves, heaters, open fires. Wear a helmet when riding a bicycle, scooter, skateboard, etc. and avoid areas of traffic. Set your water heater to 120 degrees or less.  HEADACHE MANAGEMENT  - improve BP control - check sleep study  ANXIETY / INSOMNIA - refer to psychology

## 2018-01-22 ENCOUNTER — Other Ambulatory Visit: Payer: Self-pay | Admitting: Adult Health Nurse Practitioner

## 2018-01-23 ENCOUNTER — Telehealth: Payer: Self-pay

## 2018-01-23 NOTE — Telephone Encounter (Signed)
Patient requesting two sets of his records.  When called to say that they are ready, patient mentioned he was told by a tall lady at [facility] that he could have someone else come pick them up for him.  Informed patient that we are not able to do that.  I apologized for the miscommunication.  Patient expressed understanding and that he will come by on 01/28/18 to pick up records.  He was instructed to bring a copy of his photo ID.  While speaking with him, informed him that we could also release to his My Chart so he has a digital copy to print as needed.  He consented to releasing to My Chart in addition to receiving the hardcopy records.  Records to be released to My Chart 01/23/18, hard copies left upfront for patient to pick up at his convenience.

## 2018-02-03 ENCOUNTER — Telehealth: Payer: Self-pay | Admitting: Diagnostic Neuroimaging

## 2018-02-03 NOTE — Telephone Encounter (Signed)
Pt medical records mailed to home address.

## 2018-02-03 NOTE — Telephone Encounter (Signed)
Pt has called stating he has tried on multiple occasions to reach medical records for his appointment on 04-01.  Pt states he needs the records for social security.  Please call

## 2018-02-21 ENCOUNTER — Other Ambulatory Visit: Payer: Self-pay | Admitting: Adult Health Nurse Practitioner

## 2018-02-26 ENCOUNTER — Other Ambulatory Visit: Payer: Medicaid Other

## 2018-02-26 DIAGNOSIS — Z8673 Personal history of transient ischemic attack (TIA), and cerebral infarction without residual deficits: Secondary | ICD-10-CM

## 2018-02-26 LAB — POCT INR: INR: 0.8 — AB (ref 2.0–3.0)

## 2018-02-26 NOTE — Progress Notes (Unsigned)
INR was performed on pt. Pt did not need this test due to pt not being on a blood thinner. Spoke with Dr. Candelaria Stagers he did say the INR was in normal limits for pt.

## 2018-02-27 ENCOUNTER — Institutional Professional Consult (permissible substitution): Payer: Self-pay | Admitting: Neurology

## 2018-02-27 ENCOUNTER — Other Ambulatory Visit: Payer: Medicaid Other

## 2018-03-05 ENCOUNTER — Ambulatory Visit: Payer: Medicaid Other | Admitting: Internal Medicine

## 2018-03-06 LAB — COMPREHENSIVE METABOLIC PANEL
ALK PHOS: 83 IU/L (ref 39–117)
ALT: 36 IU/L (ref 0–44)
AST: 34 IU/L (ref 0–40)
Albumin/Globulin Ratio: 1.7 (ref 1.2–2.2)
Albumin: 4.5 g/dL (ref 3.5–5.5)
BILIRUBIN TOTAL: 0.2 mg/dL (ref 0.0–1.2)
BUN / CREAT RATIO: 14 (ref 9–20)
BUN: 16 mg/dL (ref 6–24)
CO2: 20 mmol/L (ref 20–29)
Calcium: 9.7 mg/dL (ref 8.7–10.2)
Chloride: 105 mmol/L (ref 96–106)
Creatinine, Ser: 1.17 mg/dL (ref 0.76–1.27)
GFR calc Af Amer: 86 mL/min/{1.73_m2} (ref >59)
GFR calc non Af Amer: 74 mL/min/{1.73_m2} (ref >59)
GLOBULIN, TOTAL: 2.7 g/dL (ref 1.5–4.5)
Glucose: 119 mg/dL — ABNORMAL HIGH (ref 65–99)
Potassium: 4.2 mmol/L (ref 3.5–5.2)
SODIUM: 142 mmol/L (ref 134–144)
Total Protein: 7.2 g/dL (ref 6.0–8.5)

## 2018-03-06 LAB — LIPID PANEL
CHOL/HDL RATIO: 5.9 ratio — AB (ref 0.0–5.0)
Cholesterol, Total: 201 mg/dL — ABNORMAL HIGH (ref 100–199)
HDL: 34 mg/dL — AB (ref >39)
LDL Calculated: 130 mg/dL — ABNORMAL HIGH (ref 0–99)
Triglycerides: 183 mg/dL — ABNORMAL HIGH (ref 0–149)
VLDL Cholesterol Cal: 37 mg/dL (ref 5–40)

## 2018-03-06 LAB — HEMOGLOBIN A1C
Est. average glucose Bld gHb Est-mCnc: 105 mg/dL
HEMOGLOBIN A1C: 5.3 % (ref 4.8–5.6)

## 2018-03-06 LAB — LEVETIRACETAM LEVEL: LEVETIRACETAM: 3.3 ug/mL — AB (ref 10.0–40.0)

## 2018-03-12 ENCOUNTER — Ambulatory Visit: Payer: Medicaid Other | Admitting: Internal Medicine

## 2018-03-12 ENCOUNTER — Encounter: Payer: Self-pay | Admitting: Internal Medicine

## 2018-03-12 VITALS — BP 138/92 | HR 77 | Temp 98.2°F | Wt 226.6 lb

## 2018-03-12 DIAGNOSIS — R569 Unspecified convulsions: Secondary | ICD-10-CM

## 2018-03-12 NOTE — Progress Notes (Signed)
   Subjective:    Patient ID: Gary [name], male    DOB: December 07, 1970, 47 y.o.   MRN: [id number]  HPI   Pt reports not experiencing any seizures recently. Neurologist increased seizure meds to 2000 mg. Will return to neurologist this month for sleep study consultation.   Pt reports occasional headaches manifesting in R backside of head.  Pt also presents with ED. Reports experiencing short lasting erections/lack of labedo and wants to discuss medication options.    Patient Active Problem List   Diagnosis Date Noted  . Hypertension 11/07/2017  . Hyperlipidemia 11/07/2017  . Seizures (HCC) 11/07/2017  . History of stroke 11/07/2017  . 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 Always use your most recent med list.          aspirin 81 MG tablet Take 1 tablet (81 mg total) by mouth daily.   atenolol 50 MG tablet Commonly known as:  TENORMIN Take 1 tablet (50 mg total) by mouth daily.   atorvastatin 20 MG tablet Commonly known as:  LIPITOR Take 1 tablet (20 mg total) by mouth daily.   levETIRAcetam 1000 MG tablet Commonly known as:  KEPPRA Take 1 tablet (1,000 mg total) by mouth 2 (two) times daily.   lisinopril-hydrochlorothiazide 20-12.5 MG tablet Commonly known as:  PRINZIDE,ZESTORETIC Take 1 tablet by mouth daily.   omeprazole 20 MG capsule Commonly known as:  PRILOSEC TAKE ONE CAPSULE BY MOUTH EVERY DAY         Review of Systems     Objective:   Physical Exam  Constitutional: He is oriented to person, place, and time.  Cardiovascular: Normal rate, regular rhythm and normal heart sounds.  Pulmonary/Chest: Effort normal and breath sounds normal.  Neurological: He is alert and oriented to person, place, and time.   BP (!) 138/92   Pulse 77   Temp 98.2 F (36.8 C) (Oral)   Wt 226 lb 9.6 oz (102.8 kg)   BMI 32.51 kg/m           Assessment &  Plan:  Follow up in 3 mo with labs a week prior  Pt will follow-up with Medication Management for ED medication options  Pt will follow up with neurologist this month for sleep study consultation  Seizure medication is not therapeutic but symptoms have greatly decreased in terms of seizure, plan not to make any adjustments in his anti-seizure meds at this time

## 2018-03-20 ENCOUNTER — Other Ambulatory Visit: Payer: Self-pay

## 2018-03-20 MED ORDER — OMEPRAZOLE 20 MG PO CPDR: 20.0000 mg | DELAYED_RELEASE_CAPSULE | Freq: Every day | ORAL | 0 refills | Status: DC

## 2018-04-21 ENCOUNTER — Institutional Professional Consult (permissible substitution): Payer: Self-pay | Admitting: Neurology

## 2018-04-21 ENCOUNTER — Telehealth: Payer: Self-pay

## 2018-04-21 NOTE — Telephone Encounter (Signed)
Pt did not show for their appt with Dr. Athar today.  

## 2018-04-22 ENCOUNTER — Other Ambulatory Visit: Payer: Self-pay | Admitting: Adult Health Nurse Practitioner

## 2018-04-22 ENCOUNTER — Encounter: Payer: Self-pay | Admitting: Neurology

## 2018-05-26 ENCOUNTER — Other Ambulatory Visit: Payer: Self-pay | Admitting: Adult Health Nurse Practitioner

## 2018-06-04 ENCOUNTER — Other Ambulatory Visit: Payer: Medicaid Other

## 2018-06-11 ENCOUNTER — Ambulatory Visit: Payer: Medicaid Other | Admitting: Internal Medicine

## 2018-06-17 ENCOUNTER — Ambulatory Visit: Payer: Medicaid Other | Admitting: Psychiatry

## 2018-06-26 ENCOUNTER — Other Ambulatory Visit: Payer: Self-pay | Admitting: Internal Medicine

## 2018-06-26 ENCOUNTER — Other Ambulatory Visit: Payer: Self-pay | Admitting: Adult Health Nurse Practitioner

## 2018-06-26 DIAGNOSIS — I10 Essential (primary) hypertension: Principal | ICD-10-CM

## 2018-06-26 DIAGNOSIS — E1159 Type 2 diabetes mellitus with other circulatory complications: Secondary | ICD-10-CM

## 2018-07-15 ENCOUNTER — Ambulatory Visit: Payer: Self-pay | Admitting: Diagnostic Neuroimaging

## 2018-07-16 ENCOUNTER — Ambulatory Visit: Payer: Self-pay | Admitting: Internal Medicine

## 2018-08-06 ENCOUNTER — Encounter: Payer: Self-pay | Admitting: Internal Medicine

## 2018-08-06 ENCOUNTER — Ambulatory Visit: Payer: Self-pay | Admitting: Internal Medicine

## 2018-08-06 VITALS — BP 176/94 | HR 72 | Temp 98.6°F | Ht 71.0 in | Wt 218.3 lb

## 2018-08-06 DIAGNOSIS — E785 Hyperlipidemia, unspecified: Secondary | ICD-10-CM

## 2018-08-06 DIAGNOSIS — R569 Unspecified convulsions: Secondary | ICD-10-CM

## 2018-08-06 DIAGNOSIS — K219 Gastro-esophageal reflux disease without esophagitis: Secondary | ICD-10-CM

## 2018-08-06 DIAGNOSIS — I10 Essential (primary) hypertension: Secondary | ICD-10-CM

## 2018-08-06 DIAGNOSIS — R200 Anesthesia of skin: Secondary | ICD-10-CM

## 2018-08-06 MED ORDER — LOSARTAN POTASSIUM-HCTZ 100-12.5 MG PO TABS: 1.0000 | ORAL_TABLET | Freq: Every day | ORAL | 3 refills | Status: DC

## 2018-08-06 MED ORDER — OMEPRAZOLE 20 MG PO CPDR: 20.0000 mg | DELAYED_RELEASE_CAPSULE | Freq: Every day | ORAL | 3 refills | Status: DC

## 2018-08-06 NOTE — Progress Notes (Signed)
Subjective:    Patient ID: Gary [name], male    DOB: 1971-05-19, 47 y.o.   MRN: [id number]  HPI   Chief Complaint  Patient presents with  . Follow-up    No new medical complaints   Pt reports that he has not had a seizure since April 2019.  Pt is experiencing persistent numbness of left hand.   Pt also reports a persistent cough that he uses candy to suppress.    Review of Systems   Patient Active Problem List   Diagnosis Date Noted  . Hypertension 11/07/2017  . Hyperlipidemia 11/07/2017  . Seizures ([place]) 11/07/2017  . History of stroke 11/07/2017  . 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 Always use your most recent med list.          aspirin 81 MG tablet Take 1 tablet (81 mg total) by mouth daily.   atenolol 50 MG tablet Commonly known as:  TENORMIN Take 1 tablet (50 mg total) by mouth daily.   atenolol 25 MG tablet Commonly known as:  TENORMIN TAKE ONE TABLET BY MOUTH EVERY DAY   atorvastatin 20 MG tablet Commonly known as:  LIPITOR Take 1 tablet (20 mg total) by mouth daily.   levETIRAcetam 1000 MG tablet Commonly known as:  KEPPRA Take 1 tablet (1,000 mg total) by mouth 2 (two) times daily.   lisinopril-hydrochlorothiazide 20-12.5 MG tablet Commonly known as:  PRINZIDE,ZESTORETIC Take 1 tablet by mouth daily.   omeprazole 20 MG capsule Commonly known as:  PRILOSEC TAKE ONE CAPSULE BY MOUTH EVERY DAY          Objective:   Physical Exam  Constitutional: He is oriented to person, place, and time.  Cardiovascular: Normal rate, regular rhythm and normal heart sounds.  Pulmonary/Chest: Effort normal and breath sounds normal.  Neurological: He is alert and oriented to person, place, and time.   BP (!) 176/94   Pulse 72   Temp 98.6 F (37 C) (Oral)   Ht _0  (1.803 m)   Wt 218 lb 4.8 oz (99 kg)   BMI 30.45 kg/m      Assessment  & Plan:   1. Gastroesophageal reflux disease, esophagitis presence not specified Refill Today:  - omeprazole (PRILOSEC) 20 MG capsule; Take 1 capsule (20 mg total) by mouth daily.  Dispense: 30 capsule; Refill: 3  2. Seizures ([place]) Pt has not had a seizure since April 2019.  Check Today:  - Comprehensive metabolic panel  - CBC - Levetiracetam level  Check in 3 Months:  - Comprehensive metabolic panel; Future - Levetiracetam level; Future  3. Hyperlipidemia, unspecified hyperlipidemia type Check in 3 Months:  - Lipid panel; Future - Hemoglobin A1c; Future  4. Essential hypertension Pt's diastolic BP remains elevated. Pt advised to to switch to low-sodium diet. Has persistent cough, question was raised as to whether it was in relation to one of his BP medications. Replace Prinzide 20-12.5 MG with Hyzaar 100-12.5 MG which is comparable.   Order Today: - losartan-hydrochlorothiazide (HYZAAR) 100-12.5 MG tablet; Take 1 tablet by mouth daily.  Dispense: 90 tablet; Refill: 3   Check in 3 Months:  - Comprehensive metabolic panel; Future - CBC; Future - Urinalysis; Future - TSH; Future  5. Numbness of left hand Persistent and related to hx of stroke.     Return in 3 months  with labs a week prior (Met C, CBC, Lipid, A1C, Keppra Level, UA, TSH)

## 2018-08-08 LAB — COMPREHENSIVE METABOLIC PANEL
A/G RATIO: 1.7 (ref 1.2–2.2)
ALK PHOS: 74 IU/L (ref 39–117)
ALT: 28 IU/L (ref 0–44)
AST: 29 IU/L (ref 0–40)
Albumin: 4.5 g/dL (ref 3.5–5.5)
BUN/Creatinine Ratio: 15 (ref 9–20)
BUN: 18 mg/dL (ref 6–24)
Bilirubin Total: 0.3 mg/dL (ref 0.0–1.2)
CALCIUM: 9.5 mg/dL (ref 8.7–10.2)
CHLORIDE: 103 mmol/L (ref 96–106)
CO2: 22 mmol/L (ref 20–29)
Creatinine, Ser: 1.22 mg/dL (ref 0.76–1.27)
GFR calc Af Amer: 82 mL/min/{1.73_m2} (ref >59)
GFR calc non Af Amer: 71 mL/min/{1.73_m2} (ref >59)
GLOBULIN, TOTAL: 2.7 g/dL (ref 1.5–4.5)
GLUCOSE: 115 mg/dL — AB (ref 65–99)
POTASSIUM: 4.9 mmol/L (ref 3.5–5.2)
SODIUM: 140 mmol/L (ref 134–144)
TOTAL PROTEIN: 7.2 g/dL (ref 6.0–8.5)

## 2018-08-08 LAB — CBC
HEMATOCRIT: 39.9 % (ref 37.5–51.0)
Hemoglobin: 13.2 g/dL (ref 13.0–17.7)
MCH: 32.1 pg (ref 26.6–33.0)
MCHC: 33.1 g/dL (ref 31.5–35.7)
MCV: 97 fL (ref 79–97)
Platelets: 253 10*3/uL (ref 150–450)
RBC: 4.11 x10E6/uL — AB (ref 4.14–5.80)
RDW: 12.5 % (ref 12.3–15.4)
WBC: 6.1 10*3/uL (ref 3.4–10.8)

## 2018-08-08 LAB — LEVETIRACETAM LEVEL: Levetiracetam Lvl: <1 ug/mL — ABNORMAL LOW (ref 10.0–40.0)

## 2018-09-16 ENCOUNTER — Telehealth: Payer: Self-pay | Admitting: *Deleted

## 2018-09-16 ENCOUNTER — Ambulatory Visit: Payer: Self-pay | Admitting: Diagnostic Neuroimaging

## 2018-09-16 NOTE — Telephone Encounter (Signed)
Patient was no show for follow up today. 

## 2018-09-17 ENCOUNTER — Encounter: Payer: Self-pay | Admitting: Diagnostic Neuroimaging

## 2018-10-29 ENCOUNTER — Other Ambulatory Visit: Payer: Self-pay

## 2018-10-30 ENCOUNTER — Ambulatory Visit: Payer: Self-pay | Admitting: Ophthalmology

## 2018-11-05 ENCOUNTER — Encounter: Payer: Self-pay | Admitting: Internal Medicine

## 2018-11-05 ENCOUNTER — Other Ambulatory Visit: Payer: Self-pay

## 2018-11-05 ENCOUNTER — Ambulatory Visit: Payer: Self-pay | Admitting: Internal Medicine

## 2018-11-05 VITALS — BP 185/99 | HR 68 | Temp 98.4°F | Ht 71.0 in | Wt 221.8 lb

## 2018-11-05 DIAGNOSIS — I10 Essential (primary) hypertension: Secondary | ICD-10-CM

## 2018-11-05 DIAGNOSIS — E785 Hyperlipidemia, unspecified: Secondary | ICD-10-CM

## 2018-11-05 DIAGNOSIS — R29898 Other symptoms and signs involving the musculoskeletal system: Secondary | ICD-10-CM

## 2018-11-05 DIAGNOSIS — R569 Unspecified convulsions: Secondary | ICD-10-CM

## 2018-11-05 NOTE — Progress Notes (Signed)
  Subjective:    Patient ID: Gary [name], male    DOB: October 05, 1971, 48 y.o.   MRN: [id number]  HPI   Patient is a 48 year old male who presents for follow up appointment. Pt reports that his persistent cough has dramatically improved. Pt does not report any new seizures. Pt concerned about temporary moment of weakness in legs and blurry vision.   Chief Complaint  Patient presents with  . Follow-up    Concerned about weakness in both legs and blurry vision    Review of Systems Patient Active Problem List   Diagnosis Date Noted  . Hypertension 11/07/2017  . Hyperlipidemia 11/07/2017  . Seizures ([place]) 11/07/2017  . History of stroke 11/07/2017  . 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 Always use your most recent med list.        aspirin 81 MG tablet Take 1 tablet (81 mg total) by mouth daily.   atenolol 50 MG tablet Commonly known as:  TENORMIN Take 1 tablet (50 mg total) by mouth daily.   atenolol 25 MG tablet Commonly known as:  TENORMIN TAKE ONE TABLET BY MOUTH EVERY DAY   atorvastatin 20 MG tablet Commonly known as:  LIPITOR Take 1 tablet (20 mg total) by mouth daily.   levETIRAcetam 1000 MG tablet Commonly known as:  KEPPRA Take 1 tablet (1,000 mg total) by mouth 2 (two) times daily.   losartan-hydrochlorothiazide 100-12.5 MG tablet Commonly known as:  HYZAAR Take 1 tablet by mouth daily.   omeprazole 20 MG capsule Commonly known as:  PRILOSEC Take 1 capsule (20 mg total) by mouth daily.          Objective:   Physical Exam Constitutional:      Appearance: Normal appearance.  Cardiovascular:     Rate and Rhythm: Normal rate and regular rhythm.  Pulmonary:     Effort: Pulmonary effort is normal.     Breath sounds: Normal breath sounds.  Neurological:     Mental Status: He is alert.  Psychiatric:        Mood and Affect: Mood  normal.        Behavior: Behavior normal.     BP (!) 185/99   Pulse 68   Temp 98.4 F (36.9 C) (Oral)   Ht _0  (1.803 m)   Wt 221 lb 12.8 oz (100.6 kg)   BMI 30.93 kg/m   BP Recorded in Exam Room at 11:10 AM is 160/100   Reflexes are present but diminished, they are symmetrical.      Assessment & Plan:   1. Essential hypertension Uncontrolled. Need lab studies to better evaluate next steps.   2. Complaints of leg weakness Normal physical exam. Strength and reflexes normal. Need lab studies to better evaluate potential causes for temporary moments of leg weakness.   Pt also concerned about blurry vision but had benign eye exam on 10/30/2018 with Dr. Jefferson Fuel.   Labs Today: CBC, Met C, A1C, Keppra Level, TSH, Urinalysis, Lipid Panel.  RTE in 2 weeks for follow up of labs and symptoms.

## 2018-11-08 LAB — URINALYSIS
Bilirubin, UA: NEGATIVE
Glucose, UA: NEGATIVE
Ketones, UA: NEGATIVE
LEUKOCYTES UA: NEGATIVE
Nitrite, UA: NEGATIVE
Protein, UA: NEGATIVE
RBC, UA: NEGATIVE
Specific Gravity, UA: 1.023 (ref 1.005–1.030)
UUROB: 1 mg/dL (ref 0.2–1.0)
pH, UA: 5.5 (ref 5.0–7.5)

## 2018-11-08 LAB — CBC
HEMATOCRIT: 37.2 % — AB (ref 37.5–51.0)
Hemoglobin: 12.8 g/dL — ABNORMAL LOW (ref 13.0–17.7)
MCH: 32.7 pg (ref 26.6–33.0)
MCHC: 34.4 g/dL (ref 31.5–35.7)
MCV: 95 fL (ref 79–97)
Platelets: 249 10*3/uL (ref 150–450)
RBC: 3.91 x10E6/uL — ABNORMAL LOW (ref 4.14–5.80)
RDW: 11.9 % (ref 11.6–15.4)
WBC: 6.7 10*3/uL (ref 3.4–10.8)

## 2018-11-08 LAB — COMPREHENSIVE METABOLIC PANEL
ALT: 42 IU/L (ref 0–44)
AST: 42 IU/L — ABNORMAL HIGH (ref 0–40)
Albumin/Globulin Ratio: 1.4 (ref 1.2–2.2)
Albumin: 4.4 g/dL (ref 4.0–5.0)
Alkaline Phosphatase: 87 IU/L (ref 39–117)
BUN/Creatinine Ratio: 13 (ref 9–20)
BUN: 17 mg/dL (ref 6–24)
Bilirubin Total: 0.2 mg/dL (ref 0.0–1.2)
CO2: 23 mmol/L (ref 20–29)
CREATININE: 1.27 mg/dL (ref 0.76–1.27)
Calcium: 9.8 mg/dL (ref 8.7–10.2)
Chloride: 101 mmol/L (ref 96–106)
GFR calc non Af Amer: 67 mL/min/{1.73_m2} (ref >59)
GFR, EST AFRICAN AMERICAN: 77 mL/min/{1.73_m2} (ref >59)
GLUCOSE: 101 mg/dL — AB (ref 65–99)
Globulin, Total: 3.2 g/dL (ref 1.5–4.5)
Potassium: 5 mmol/L (ref 3.5–5.2)
Sodium: 139 mmol/L (ref 134–144)
TOTAL PROTEIN: 7.6 g/dL (ref 6.0–8.5)

## 2018-11-08 LAB — LIPID PANEL
CHOL/HDL RATIO: 5.7 ratio — AB (ref 0.0–5.0)
Cholesterol, Total: 269 mg/dL — ABNORMAL HIGH (ref 100–199)
HDL: 47 mg/dL (ref >39)
LDL CALC: 199 mg/dL — AB (ref 0–99)
Triglycerides: 113 mg/dL (ref 0–149)
VLDL CHOLESTEROL CAL: 23 mg/dL (ref 5–40)

## 2018-11-08 LAB — TSH: TSH: 0.652 u[IU]/mL (ref 0.450–4.500)

## 2018-11-08 LAB — HEMOGLOBIN A1C
Est. average glucose Bld gHb Est-mCnc: 103 mg/dL
Hgb A1c MFr Bld: 5.2 % (ref 4.8–5.6)

## 2018-11-08 LAB — LEVETIRACETAM LEVEL

## 2018-11-19 ENCOUNTER — Ambulatory Visit: Payer: Self-pay | Admitting: Internal Medicine

## 2018-11-19 ENCOUNTER — Encounter: Payer: Self-pay | Admitting: Internal Medicine

## 2018-11-19 VITALS — BP 181/114 | HR 66 | Temp 98.1°F | Ht 71.0 in | Wt 222.1 lb

## 2018-11-19 DIAGNOSIS — I10 Essential (primary) hypertension: Secondary | ICD-10-CM

## 2018-11-19 DIAGNOSIS — R29898 Other symptoms and signs involving the musculoskeletal system: Secondary | ICD-10-CM

## 2018-11-19 MED ORDER — HYDROCHLOROTHIAZIDE 25 MG PO TABS: 25.0000 mg | ORAL_TABLET | Freq: Every day | ORAL | 3 refills | Status: DC

## 2018-11-19 MED ORDER — AMLODIPINE BESYLATE 5 MG PO TABS: 5.0000 mg | ORAL_TABLET | Freq: Every day | ORAL | 0 refills | Status: DC

## 2018-11-19 MED ORDER — ATENOLOL 100 MG PO TABS: 100.0000 mg | ORAL_TABLET | Freq: Every day | ORAL | 3 refills | Status: DC

## 2018-11-19 NOTE — Progress Notes (Signed)
   Subjective:    Patient ID: Gary [name], male    DOB: 1970/11/11, 48 y.o.   MRN: [id number]  HPI  Patient is a 48 year old male who presents for a follow up about hypertension. Pt denies any new seizures and denies being out of any medications since last visit.    Chief Complaint  Patient presents with  . Follow-up    continued weakness in legs     Review of Systems  Patient Active Problem List   Diagnosis Date Noted  . Hypertension 11/07/2017  . Hyperlipidemia 11/07/2017  . Seizures (HCC) 11/07/2017  . History of stroke 11/07/2017  . 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 Always use your most recent med list.        aspirin 81 MG tablet Take 1 tablet (81 mg total) by mouth daily.   atenolol 50 MG tablet Commonly known as:  TENORMIN Take 1 tablet (50 mg total) by mouth daily.   atenolol 25 MG tablet Commonly known as:  TENORMIN TAKE ONE TABLET BY MOUTH EVERY DAY   atorvastatin 20 MG tablet Commonly known as:  LIPITOR Take 1 tablet (20 mg total) by mouth daily.   levETIRAcetam 1000 MG tablet Commonly known as:  KEPPRA Take 1 tablet (1,000 mg total) by mouth 2 (two) times daily.   losartan-hydrochlorothiazide 100-12.5 MG tablet Commonly known as:  HYZAAR Take 1 tablet by mouth daily.   omeprazole 20 MG capsule Commonly known as:  PRILOSEC Take 1 capsule (20 mg total) by mouth daily.          Objective:   Physical Exam Vitals signs reviewed.  Constitutional:      Appearance: Normal appearance.  Cardiovascular:     Rate and Rhythm: Normal rate and regular rhythm.  Neurological:     Mental Status: He is alert and oriented to person, place, and time.    BP (!) 181/114 (BP Location: Left Arm, Patient Position: Sitting, Cuff Size: Normal)   Pulse 66   Temp 98.1 F (36.7 C) (Oral)   Ht 5\' 11"  (1.803 m)   Wt 222 lb 1.6 oz (100.7  kg)   BMI 30.98 kg/m   BP in exam room @ 9:27AM - 160/112     Assessment & Plan:   1. Essential hypertension Remains uncontrolled. Changes in therapy noted below. Will recheck in 3 weeks to see if change in medication therapy is effective.   -Increase HCTZ from 12.5mg  to 25 mg daily.  -Increase Atenolol from 75mg  to 100mg  daily.  -Start Amlodipine 5mg  daily.   Ordered Today:   - hydrochlorothiazide (HYDRODIURIL) 25 MG tablet; Take 1 tablet (25 mg total) by mouth daily.  Dispense: 90 tablet; Refill: 3 - amLODipine (NORVASC) 5 MG tablet; Take 1 tablet (5 mg total) by mouth daily.  Dispense: 90 tablet; Refill: 0 - atenolol (TENORMIN) 100 MG tablet; Take 1 tablet (100 mg total) by mouth daily.  Dispense: 90 tablet; Refill: 3  2. Complaints of leg weakness Still experiencing leg weakness. Return to follow up with [facility] in regards to seizure disorder, previous stroke, and persistent leg weakness. Pt reapplying for [facility].   Check in 2 weeks:  - B12; Future - Comprehensive metabolic panel; Future - Folate; Future - Sedimentation rate; Future   RTE in 3 weeks

## 2018-12-03 ENCOUNTER — Other Ambulatory Visit: Payer: Self-pay

## 2018-12-03 DIAGNOSIS — R29898 Other symptoms and signs involving the musculoskeletal system: Secondary | ICD-10-CM

## 2018-12-04 LAB — SEDIMENTATION RATE: Sed Rate: 29 mm/hr — ABNORMAL HIGH (ref 0–15)

## 2018-12-04 LAB — COMPREHENSIVE METABOLIC PANEL
ALT: 47 IU/L — ABNORMAL HIGH (ref 0–44)
AST: 65 IU/L — ABNORMAL HIGH (ref 0–40)
Albumin/Globulin Ratio: 1.5 (ref 1.2–2.2)
Albumin: 4.6 g/dL (ref 4.0–5.0)
Alkaline Phosphatase: 89 IU/L (ref 39–117)
BUN/Creatinine Ratio: 18 (ref 9–20)
BUN: 23 mg/dL (ref 6–24)
Bilirubin Total: 0.3 mg/dL (ref 0.0–1.2)
CO2: 22 mmol/L (ref 20–29)
Calcium: 10 mg/dL (ref 8.7–10.2)
Chloride: 101 mmol/L (ref 96–106)
Creatinine, Ser: 1.26 mg/dL (ref 0.76–1.27)
GFR calc non Af Amer: 67 mL/min/{1.73_m2} (ref >59)
GFR, EST AFRICAN AMERICAN: 78 mL/min/{1.73_m2} (ref >59)
GLOBULIN, TOTAL: 3 g/dL (ref 1.5–4.5)
Glucose: 102 mg/dL — ABNORMAL HIGH (ref 65–99)
Potassium: 5.2 mmol/L (ref 3.5–5.2)
Sodium: 140 mmol/L (ref 134–144)
Total Protein: 7.6 g/dL (ref 6.0–8.5)

## 2018-12-04 LAB — FOLATE: Folate: 5.2 ng/mL (ref >3.0)

## 2018-12-04 LAB — VITAMIN B12: Vitamin B-12: 537 pg/mL (ref 232–1245)

## 2018-12-10 ENCOUNTER — Ambulatory Visit: Payer: Self-pay | Admitting: Internal Medicine

## 2018-12-10 ENCOUNTER — Encounter: Payer: Self-pay | Admitting: Internal Medicine

## 2018-12-10 VITALS — BP 192/104 | HR 67 | Temp 98.0°F | Ht 70.0 in | Wt 220.5 lb

## 2018-12-10 DIAGNOSIS — I10 Essential (primary) hypertension: Secondary | ICD-10-CM

## 2018-12-10 DIAGNOSIS — M25551 Pain in right hip: Secondary | ICD-10-CM

## 2018-12-10 MED ORDER — AMLODIPINE BESYLATE 10 MG PO TABS: 10.0000 mg | ORAL_TABLET | Freq: Every day | ORAL | 0 refills | Status: DC

## 2018-12-10 NOTE — Patient Instructions (Addendum)
If on 5 mg Amlodipine then take two tablets a day at the same time.  Hydrochlorothiazide should take 25 mg a day total, so if on 12.5 mg tablet take two tablets a day at the same time.  Maintain low-salt diet.

## 2018-12-10 NOTE — Progress Notes (Signed)
   Subjective:    Patient ID: Gary [name], male    DOB: [birth year], 48 y.o.   MRN: [id number]  HPI  Patient presents for follow up with hypertension. Patient BP has not significantly improved, patient reports that he is taking BP medications as prescribed. Patient reports new pain R hip. He states that when he gets out of vehicle it feels like it is going to "pop out."   Chief Complaint  Patient presents with  . Follow-up    Pt concerned about pain in R hip on exertion    Review of Systems Patient Active Problem List   Diagnosis Date Noted  . Hypertension 11/07/2017  . Hyperlipidemia 11/07/2017  . Seizures (HCC) 11/07/2017  . History of stroke 11/07/2017  . Submandibular abscess 11/07/2017   Allergies as of 12/10/2018   No Known Allergies     Medication List       Accurate as of December 10, 2018 10:13 AM. Always use your most recent med list.        amLODipine 5 MG tablet Commonly known as:  NORVASC Take 1 tablet (5 mg total) by mouth daily.   aspirin 81 MG tablet Take 1 tablet (81 mg total) by mouth daily.   atenolol 100 MG tablet Commonly known as:  TENORMIN Take 1 tablet (100 mg total) by mouth daily.   hydrochlorothiazide 25 MG tablet Commonly known as:  HYDRODIURIL Take 1 tablet (25 mg total) by mouth daily.   levETIRAcetam 1000 MG tablet Commonly known as:  KEPPRA Take 1 tablet (1,000 mg total) by mouth 2 (two) times daily.   losartan 100 MG tablet Commonly known as:  COZAAR Take 100 mg by mouth daily.   omeprazole 20 MG capsule Commonly known as:  PRILOSEC Take 1 capsule (20 mg total) by mouth daily.         Objective:   Physical Exam  BP (!) 192/104 (BP Location: Left Arm, Patient Position: Sitting)   Pulse 67   Temp 98 F (36.7 C) (Oral)   Ht 5\' 10"  (1.778 m)   Wt 220 lb 8 oz (100 kg)   BMI 31.64 kg/m   BP in exam room @ 10:15AM 180/102     Assessment & Plan:   1. Pain in joint of right hip Patient experiencing new pain in right  hip upon exertion. Etiology unknown.   Order Today:  - DG Hip Unilat W OR W/O Pelvis 1V Right; Future  2. Essential hypertension Still not at target.  Increase Norvasc to 10 mg daily.  Order Today:  - amLODipine (NORVASC) 10 MG tablet; Take 1 tablet (10 mg total) by mouth daily.  Dispense: 30 tablet; Refill: 0   RTE in 2 weeks for BP recheck and to evaluate any necessary change in BP medication therapy.

## 2018-12-24 ENCOUNTER — Ambulatory Visit: Payer: Self-pay | Admitting: Internal Medicine

## 2018-12-31 ENCOUNTER — Ambulatory Visit: Payer: Self-pay | Admitting: Internal Medicine

## 2019-01-01 ENCOUNTER — Other Ambulatory Visit: Payer: Self-pay | Admitting: Internal Medicine

## 2019-01-01 DIAGNOSIS — K219 Gastro-esophageal reflux disease without esophagitis: Secondary | ICD-10-CM

## 2019-01-14 ENCOUNTER — Ambulatory Visit: Payer: Self-pay | Admitting: Internal Medicine

## 2019-01-14 DIAGNOSIS — M25522 Pain in left elbow: Secondary | ICD-10-CM

## 2019-01-14 DIAGNOSIS — I10 Essential (primary) hypertension: Secondary | ICD-10-CM

## 2019-01-14 NOTE — Progress Notes (Signed)
   Subjective:    Patient ID: Gary [name], male    DOB: Mar 05, 1971, 48 y.o.   MRN: [id number]  HPI  Patient is a 48 year old male who was contacted by phone. Patient is having a lot of pain and weakness in left elbow, the same side his stroke affected. Pt takes ibuprofen in afternoon to relieve pain. Pt did not express any new concerns, he stated he tried to go to [employer] to check his BP but machine was nonfunctional.      Review of Systems  Patient Active Problem List   Diagnosis Date Noted  . Hypertension 11/07/2017  . Hyperlipidemia 11/07/2017  . Seizures (HCC) 11/07/2017  . History of stroke 11/07/2017  . Submandibular abscess 11/07/2017   Allergies as of 01/14/2019   No Known Allergies     Medication List       Accurate as of January 14, 2019  9:40 AM. Always use your most recent med list.        amLODipine 10 MG tablet Commonly known as:  NORVASC Take 1 tablet (10 mg total) by mouth daily.   aspirin 81 MG tablet Take 1 tablet (81 mg total) by mouth daily.   atenolol 100 MG tablet Commonly known as:  TENORMIN Take 1 tablet (100 mg total) by mouth daily.   hydrochlorothiazide 25 MG tablet Commonly known as:  HYDRODIURIL Take 1 tablet (25 mg total) by mouth daily.   levETIRAcetam 1000 MG tablet Commonly known as:  KEPPRA Take 1 tablet (1,000 mg total) by mouth 2 (two) times daily.   losartan 100 MG tablet Commonly known as:  COZAAR Take 100 mg by mouth daily.   omeprazole 20 MG capsule Commonly known as:  PRILOSEC TAKE ONE CAPSULE BY MOUTH EVERY DAY          Objective:   Physical Exam  Telephone Encounter      Assessment & Plan:   1. Essential hypertension Unable to assess.   Patient will come to [facility] on 01/15/19 at 11:30 AM to pick up electronic BP cuff. Pt advised to check his BP twice a day, once in the morning and once in the evening, and keep a log.   Check in 1 Month:  - Aldosterone; Future - Renin; Future - Comprehensive metabolic  panel; Future  2. Pain in left elbow Chronic pain and weakness that worsens in the evening. Uses ibuprofen for relief. Reccommended switching to acetaminophen.   RTE in 2 weeks to review BP log and schedule lab appointment in May.

## 2019-01-28 ENCOUNTER — Ambulatory Visit: Payer: Self-pay | Admitting: Internal Medicine

## 2019-02-25 ENCOUNTER — Other Ambulatory Visit: Payer: Self-pay | Admitting: Internal Medicine

## 2019-02-25 DIAGNOSIS — K219 Gastro-esophageal reflux disease without esophagitis: Secondary | ICD-10-CM

## 2019-03-05 ENCOUNTER — Other Ambulatory Visit: Payer: Self-pay

## 2019-03-11 ENCOUNTER — Other Ambulatory Visit: Payer: Self-pay

## 2019-03-12 ENCOUNTER — Other Ambulatory Visit: Payer: Self-pay

## 2019-03-12 DIAGNOSIS — I10 Essential (primary) hypertension: Secondary | ICD-10-CM

## 2019-03-18 ENCOUNTER — Ambulatory Visit: Payer: Self-pay | Admitting: Internal Medicine

## 2019-03-20 LAB — ALDOSTERONE: ALDOSTERONE: 2.5 ng/dL (ref 0.0–30.0)

## 2019-03-20 LAB — RENIN: Renin: 0.634 ng/mL/hr (ref 0.167–5.380)

## 2019-03-20 LAB — COMPREHENSIVE METABOLIC PANEL
Alkaline Phosphatase: 76 IU/L (ref 39–117)
Bilirubin Total: 0.4 mg/dL (ref 0.0–1.2)

## 2019-09-30 ENCOUNTER — Telehealth: Payer: Self-pay | Admitting: Pharmacy Technician

## 2019-09-30 NOTE — Telephone Encounter (Signed)
Patient failed to provide requested 2020 financial documentation.  No additional medication assistance will be provided by [facility] without the required proof of income documentation.  Patient notified by letter.  Aiva Miskell J. Carrington Mullenax Care Manager [facility] 

## 2019-11-05 ENCOUNTER — Ambulatory Visit: Payer: Self-pay | Admitting: Ophthalmology

## 2019-12-23 LAB — COMPREHENSIVE METABOLIC PANEL
ALT: 48 IU/L — ABNORMAL HIGH (ref 0–44)
AST: 50 IU/L — ABNORMAL HIGH (ref 0–40)
Albumin/Globulin Ratio: 1.6 (ref 1.2–2.2)
Albumin: 4.7 g/dL (ref 4.0–5.0)
BUN/Creatinine Ratio: 19 (ref 9–20)
BUN: 24 mg/dL (ref 6–24)
CO2: 23 mmol/L (ref 20–29)
Calcium: 9.8 mg/dL (ref 8.7–10.2)
Chloride: 103 mmol/L (ref 96–106)
Creatinine, Ser: 1.26 mg/dL (ref 0.76–1.27)
GFR calc Af Amer: 78 mL/min/{1.73_m2} (ref >59)
GFR calc non Af Amer: 67 mL/min/{1.73_m2} (ref >59)
Globulin, Total: 2.9 g/dL (ref 1.5–4.5)
Glucose: 90 mg/dL (ref 65–99)
Potassium: 4.2 mmol/L (ref 3.5–5.2)
Sodium: 139 mmol/L (ref 134–144)
Total Protein: 7.6 g/dL (ref 6.0–8.5)

## 2020-03-08 ENCOUNTER — Ambulatory Visit: Payer: Self-pay

## 2020-12-29 ENCOUNTER — Observation Stay
Admission: EM | Admit: 2020-12-29 | Discharge: 2020-12-30 | Disposition: A | Discharge status: Home / Self Care | Payer: Self-pay | Attending: Internal Medicine | Admitting: Internal Medicine

## 2020-12-29 ENCOUNTER — Emergency Department: Payer: Self-pay

## 2020-12-29 ENCOUNTER — Other Ambulatory Visit: Payer: Self-pay

## 2020-12-29 DIAGNOSIS — Y900 Blood alcohol level of less than 20 mg/100 ml: Secondary | ICD-10-CM | POA: Insufficient documentation

## 2020-12-29 DIAGNOSIS — R7989 Other specified abnormal findings of blood chemistry: Secondary | ICD-10-CM | POA: Diagnosis present

## 2020-12-29 DIAGNOSIS — N182 Chronic kidney disease, stage 2 (mild): Secondary | ICD-10-CM | POA: Diagnosis present

## 2020-12-29 DIAGNOSIS — R569 Unspecified convulsions: Secondary | ICD-10-CM

## 2020-12-29 DIAGNOSIS — Z7289 Other problems related to lifestyle: Secondary | ICD-10-CM

## 2020-12-29 DIAGNOSIS — I129 Hypertensive chronic kidney disease with stage 1 through stage 4 chronic kidney disease, or unspecified chronic kidney disease: Secondary | ICD-10-CM | POA: Insufficient documentation

## 2020-12-29 DIAGNOSIS — Z20822 Contact with and (suspected) exposure to covid-19: Secondary | ICD-10-CM | POA: Insufficient documentation

## 2020-12-29 DIAGNOSIS — Z8673 Personal history of transient ischemic attack (TIA), and cerebral infarction without residual deficits: Secondary | ICD-10-CM | POA: Insufficient documentation

## 2020-12-29 DIAGNOSIS — F1010 Alcohol abuse, uncomplicated: Secondary | ICD-10-CM | POA: Diagnosis present

## 2020-12-29 DIAGNOSIS — G459 Transient cerebral ischemic attack, unspecified: Secondary | ICD-10-CM

## 2020-12-29 DIAGNOSIS — Z79899 Other long term (current) drug therapy: Secondary | ICD-10-CM | POA: Insufficient documentation

## 2020-12-29 DIAGNOSIS — I69398 Other sequelae of cerebral infarction: Secondary | ICD-10-CM

## 2020-12-29 DIAGNOSIS — I10 Essential (primary) hypertension: Secondary | ICD-10-CM | POA: Diagnosis present

## 2020-12-29 DIAGNOSIS — Z720 Tobacco use: Secondary | ICD-10-CM | POA: Diagnosis present

## 2020-12-29 DIAGNOSIS — E785 Hyperlipidemia, unspecified: Secondary | ICD-10-CM | POA: Diagnosis present

## 2020-12-29 DIAGNOSIS — F17210 Nicotine dependence, cigarettes, uncomplicated: Secondary | ICD-10-CM | POA: Insufficient documentation

## 2020-12-29 DIAGNOSIS — G40909 Epilepsy, unspecified, not intractable, without status epilepticus: Secondary | ICD-10-CM

## 2020-12-29 DIAGNOSIS — N179 Acute kidney failure, unspecified: Secondary | ICD-10-CM | POA: Insufficient documentation

## 2020-12-29 DIAGNOSIS — I639 Cerebral infarction, unspecified: Principal | ICD-10-CM | POA: Diagnosis present

## 2020-12-29 DIAGNOSIS — Z7982 Long term (current) use of aspirin: Secondary | ICD-10-CM | POA: Insufficient documentation

## 2020-12-29 DIAGNOSIS — R531 Weakness: Secondary | ICD-10-CM

## 2020-12-29 DIAGNOSIS — R778 Other specified abnormalities of plasma proteins: Secondary | ICD-10-CM | POA: Insufficient documentation

## 2020-12-29 HISTORY — DX: Alcohol use, unspecified, uncomplicated: F10.90

## 2020-12-29 HISTORY — DX: Tobacco use: Z72.0

## 2020-12-29 HISTORY — DX: Other problems related to lifestyle: Z72.89

## 2020-12-29 HISTORY — DX: Other specified health status: Z78.9

## 2020-12-29 LAB — LIPID PANEL
Cholesterol: 272 mg/dL — ABNORMAL HIGH (ref 0–200)
HDL: 43 mg/dL (ref >40)
LDL Cholesterol: 191 mg/dL — ABNORMAL HIGH (ref 0–99)
Total CHOL/HDL Ratio: 6.3 RATIO
Triglycerides: 189 mg/dL — ABNORMAL HIGH (ref <150)
VLDL: 38 mg/dL (ref 0–40)

## 2020-12-29 LAB — COMPREHENSIVE METABOLIC PANEL
ALT: 58 U/L — ABNORMAL HIGH (ref 0–44)
AST: 48 U/L — ABNORMAL HIGH (ref 15–41)
Albumin: 4.3 g/dL (ref 3.5–5.0)
Alkaline Phosphatase: 76 U/L (ref 38–126)
Anion gap: 9 (ref 5–15)
BUN: 25 mg/dL — ABNORMAL HIGH (ref 6–20)
CO2: 22 mmol/L (ref 22–32)
Calcium: 9.4 mg/dL (ref 8.9–10.3)
Chloride: 106 mmol/L (ref 98–111)
Creatinine, Ser: 1.64 mg/dL — ABNORMAL HIGH (ref 0.61–1.24)
GFR, Estimated: 51 mL/min — ABNORMAL LOW (ref >60)
Glucose, Bld: 109 mg/dL — ABNORMAL HIGH (ref 70–99)
Potassium: 4.2 mmol/L (ref 3.5–5.1)
Sodium: 137 mmol/L (ref 135–145)
Total Bilirubin: 0.7 mg/dL (ref 0.3–1.2)
Total Protein: 8 g/dL (ref 6.5–8.1)

## 2020-12-29 LAB — CBC WITH DIFFERENTIAL/PLATELET
Abs Immature Granulocytes: 0.02 10*3/uL (ref 0.00–0.07)
Basophils Absolute: 0 10*3/uL (ref 0.0–0.1)
Basophils Relative: 0 %
Eosinophils Absolute: 0 10*3/uL (ref 0.0–0.5)
Eosinophils Relative: 0 %
HCT: 40.4 % (ref 39.0–52.0)
Hemoglobin: 13.6 g/dL (ref 13.0–17.0)
Immature Granulocytes: 0 %
Lymphocytes Relative: 38 %
Lymphs Abs: 3 10*3/uL (ref 0.7–4.0)
MCH: 32.5 pg (ref 26.0–34.0)
MCHC: 33.7 g/dL (ref 30.0–36.0)
MCV: 96.7 fL (ref 80.0–100.0)
Monocytes Absolute: 0.8 10*3/uL (ref 0.1–1.0)
Monocytes Relative: 10 %
Neutro Abs: 4 10*3/uL (ref 1.7–7.7)
Neutrophils Relative %: 52 %
Platelets: 231 10*3/uL (ref 150–400)
RBC: 4.18 MIL/uL — ABNORMAL LOW (ref 4.22–5.81)
RDW: 13 % (ref 11.5–15.5)
WBC: 7.8 10*3/uL (ref 4.0–10.5)
nRBC: 0 % (ref 0.0–0.2)

## 2020-12-29 LAB — TROPONIN I (HIGH SENSITIVITY)
Troponin I (High Sensitivity): 22 ng/L — ABNORMAL HIGH (ref <18)
Troponin I (High Sensitivity): 27 ng/L — ABNORMAL HIGH (ref <18)
Troponin I (High Sensitivity): 30 ng/L — ABNORMAL HIGH (ref <18)
Troponin I (High Sensitivity): 30 ng/L — ABNORMAL HIGH (ref <18)
Troponin I (High Sensitivity): 31 ng/L — ABNORMAL HIGH (ref <18)

## 2020-12-29 LAB — CBC
HCT: 37.6 % — ABNORMAL LOW (ref 39.0–52.0)
Hemoglobin: 12.7 g/dL — ABNORMAL LOW (ref 13.0–17.0)
MCH: 32.6 pg (ref 26.0–34.0)
MCHC: 33.8 g/dL (ref 30.0–36.0)
MCV: 96.7 fL (ref 80.0–100.0)
Platelets: 226 10*3/uL (ref 150–400)
RBC: 3.89 MIL/uL — ABNORMAL LOW (ref 4.22–5.81)
RDW: 13.1 % (ref 11.5–15.5)
WBC: 10.1 10*3/uL (ref 4.0–10.5)
nRBC: 0 % (ref 0.0–0.2)

## 2020-12-29 LAB — URINE DRUG SCREEN, QUALITATIVE (ARMC ONLY)
Amphetamines, Ur Screen: NOT DETECTED
Barbiturates, Ur Screen: NOT DETECTED
Benzodiazepine, Ur Scrn: NOT DETECTED
Cannabinoid 50 Ng, Ur ~~LOC~~: NOT DETECTED
Cocaine Metabolite,Ur ~~LOC~~: NOT DETECTED
MDMA (Ecstasy)Ur Screen: NOT DETECTED
Methadone Scn, Ur: NOT DETECTED
Opiate, Ur Screen: NOT DETECTED
Phencyclidine (PCP) Ur S: NOT DETECTED
Tricyclic, Ur Screen: NOT DETECTED

## 2020-12-29 LAB — BASIC METABOLIC PANEL
Anion gap: 8 (ref 5–15)
BUN: 25 mg/dL — ABNORMAL HIGH (ref 6–20)
CO2: 22 mmol/L (ref 22–32)
Calcium: 9 mg/dL (ref 8.9–10.3)
Chloride: 105 mmol/L (ref 98–111)
Creatinine, Ser: 1.32 mg/dL — ABNORMAL HIGH (ref 0.61–1.24)
GFR, Estimated: >60 mL/min (ref >60)
Glucose, Bld: 104 mg/dL — ABNORMAL HIGH (ref 70–99)
Potassium: 4.2 mmol/L (ref 3.5–5.1)
Sodium: 135 mmol/L (ref 135–145)

## 2020-12-29 LAB — TSH: TSH: 0.474 u[IU]/mL (ref 0.350–4.500)

## 2020-12-29 LAB — HEMOGLOBIN A1C
Hgb A1c MFr Bld: 5.5 % (ref 4.8–5.6)
Mean Plasma Glucose: 111.15 mg/dL

## 2020-12-29 LAB — MAGNESIUM: Magnesium: 2.3 mg/dL (ref 1.7–2.4)

## 2020-12-29 LAB — T4, FREE: Free T4: 0.82 ng/dL (ref 0.61–1.12)

## 2020-12-29 LAB — VITAMIN B12: Vitamin B-12: 447 pg/mL (ref 180–914)

## 2020-12-29 LAB — ETHANOL: Alcohol, Ethyl (B): <10 mg/dL (ref <10)

## 2020-12-29 MED ORDER — CLOPIDOGREL BISULFATE 75 MG PO TABS: 75.0000 mg | ORAL_TABLET | Freq: Every day | ORAL | Status: DC

## 2020-12-29 MED ORDER — ACETAMINOPHEN 650 MG RE SUPP: 650.0000 mg | RECTAL | Status: DC | PRN

## 2020-12-29 MED ORDER — ASPIRIN EC 81 MG PO TBEC
81.0000 mg | DELAYED_RELEASE_TABLET | Freq: Every day | ORAL | Status: DC
  Administered 2020-12-30: 09:00:00 81 mg via ORAL
  Filled 2020-12-29: qty 1

## 2020-12-29 MED ORDER — LORAZEPAM 2 MG/ML IJ SOLN: 0.0000 mg | Freq: Four times a day (QID) | INTRAMUSCULAR | Status: DC

## 2020-12-29 MED ORDER — NICOTINE 21 MG/24HR TD PT24
21.0000 mg | MEDICATED_PATCH | Freq: Every day | TRANSDERMAL | Status: DC
  Filled 2020-12-29: qty 1

## 2020-12-29 MED ORDER — THIAMINE HCL 100 MG/ML IJ SOLN: 100.0000 mg | Freq: Every day | INTRAMUSCULAR | Status: DC

## 2020-12-29 MED ORDER — HYDRALAZINE HCL 20 MG/ML IJ SOLN: 5.0000 mg | INTRAMUSCULAR | Status: DC | PRN

## 2020-12-29 MED ORDER — VITAMIN B-12 1000 MCG PO TABS
1000.0000 ug | ORAL_TABLET | Freq: Every day | ORAL | Status: DC
  Administered 2020-12-30: 1000 ug via ORAL
  Filled 2020-12-29: qty 1

## 2020-12-29 MED ORDER — STROKE: EARLY STAGES OF RECOVERY BOOK: Freq: Once | Status: AC

## 2020-12-29 MED ORDER — CLOPIDOGREL BISULFATE 75 MG PO TABS
300.0000 mg | ORAL_TABLET | Freq: Once | ORAL | Status: AC
  Administered 2020-12-29: 300 mg via ORAL
  Filled 2020-12-29: qty 4

## 2020-12-29 MED ORDER — ENOXAPARIN SODIUM 60 MG/0.6ML ~~LOC~~ SOLN
0.5000 mg/kg | SUBCUTANEOUS | Status: DC
  Administered 2020-12-29: 21:00:00 52.5 mg via SUBCUTANEOUS
  Filled 2020-12-29: qty 0.6

## 2020-12-29 MED ORDER — SENNOSIDES-DOCUSATE SODIUM 8.6-50 MG PO TABS: 1.0000 | ORAL_TABLET | Freq: Every evening | ORAL | Status: DC | PRN

## 2020-12-29 MED ORDER — ACETAMINOPHEN 160 MG/5ML PO SOLN
650.0000 mg | ORAL | Status: DC | PRN
  Filled 2020-12-29: qty 20.3

## 2020-12-29 MED ORDER — PANTOPRAZOLE SODIUM 40 MG PO TBEC
40.0000 mg | DELAYED_RELEASE_TABLET | Freq: Every day | ORAL | Status: DC
  Administered 2020-12-29 – 2020-12-30 (×2): 40 mg via ORAL
  Filled 2020-12-29 (×2): qty 1

## 2020-12-29 MED ORDER — SODIUM CHLORIDE 0.9 % IV BOLUS
1000.0000 mL | Freq: Once | INTRAVENOUS | Status: AC
  Administered 2020-12-29: 1000 mL via INTRAVENOUS

## 2020-12-29 MED ORDER — LORAZEPAM 2 MG/ML IJ SOLN: 0.0000 mg | Freq: Two times a day (BID) | INTRAMUSCULAR | Status: DC

## 2020-12-29 MED ORDER — THIAMINE HCL 100 MG PO TABS: 100.0000 mg | ORAL_TABLET | Freq: Every day | ORAL | Status: DC

## 2020-12-29 MED ORDER — LORAZEPAM 2 MG/ML IJ SOLN: 1.0000 mg | INTRAMUSCULAR | Status: DC | PRN

## 2020-12-29 MED ORDER — ADULT MULTIVITAMIN W/MINERALS CH: 1.0000 | ORAL_TABLET | Freq: Every day | ORAL | Status: DC

## 2020-12-29 MED ORDER — FOLIC ACID 1 MG PO TABS
1.0000 mg | ORAL_TABLET | Freq: Every day | ORAL | Status: DC
  Administered 2020-12-29 – 2020-12-30 (×2): 1 mg via ORAL
  Filled 2020-12-29 (×2): qty 1

## 2020-12-29 MED ORDER — IOHEXOL 350 MG/ML SOLN
75.0000 mL | Freq: Once | INTRAVENOUS | Status: AC | PRN
  Administered 2020-12-29: 75 mL via INTRAVENOUS

## 2020-12-29 MED ORDER — THIAMINE HCL 100 MG PO TABS
100.0000 mg | ORAL_TABLET | Freq: Every day | ORAL | Status: DC
  Administered 2020-12-30: 100 mg via ORAL
  Filled 2020-12-29: qty 1

## 2020-12-29 MED ORDER — ADULT MULTIVITAMIN W/MINERALS CH
1.0000 | ORAL_TABLET | Freq: Every day | ORAL | Status: DC
  Administered 2020-12-30: 09:00:00 1 via ORAL
  Filled 2020-12-29: qty 1

## 2020-12-29 MED ORDER — ASPIRIN EC 325 MG PO TBEC
325.0000 mg | DELAYED_RELEASE_TABLET | Freq: Once | ORAL | Status: AC
  Administered 2020-12-29: 17:00:00 325 mg via ORAL
  Filled 2020-12-29: qty 1

## 2020-12-29 MED ORDER — LEVETIRACETAM 500 MG PO TABS
500.0000 mg | ORAL_TABLET | Freq: Two times a day (BID) | ORAL | Status: DC
  Administered 2020-12-30: 500 mg via ORAL
  Filled 2020-12-29 (×3): qty 1

## 2020-12-29 MED ORDER — AMLODIPINE BESYLATE 10 MG PO TABS
10.0000 mg | ORAL_TABLET | Freq: Every day | ORAL | Status: DC
  Administered 2020-12-29 – 2020-12-30 (×2): 10 mg via ORAL
  Filled 2020-12-29 (×2): qty 1

## 2020-12-29 MED ORDER — ENOXAPARIN SODIUM 40 MG/0.4ML ~~LOC~~ SOLN: 40.0000 mg | SUBCUTANEOUS | Status: DC

## 2020-12-29 MED ORDER — THIAMINE HCL 100 MG/ML IJ SOLN
500.0000 mg | Freq: Once | INTRAVENOUS | Status: AC
  Administered 2020-12-29: 500 mg via INTRAVENOUS
  Filled 2020-12-29: qty 5

## 2020-12-29 MED ORDER — ATORVASTATIN CALCIUM 20 MG PO TABS
40.0000 mg | ORAL_TABLET | Freq: Every day | ORAL | Status: DC
  Administered 2020-12-29: 21:00:00 40 mg via ORAL
  Filled 2020-12-29: qty 2

## 2020-12-29 MED ORDER — LORAZEPAM 1 MG PO TABS: 1.0000 mg | ORAL_TABLET | ORAL | Status: DC | PRN

## 2020-12-29 MED ORDER — ACETAMINOPHEN 325 MG PO TABS: 650.0000 mg | ORAL_TABLET | ORAL | Status: DC | PRN

## 2020-12-29 MED ORDER — CLOPIDOGREL BISULFATE 75 MG PO TABS
75.0000 mg | ORAL_TABLET | Freq: Every day | ORAL | Status: DC
  Administered 2020-12-30: 75 mg via ORAL
  Filled 2020-12-29: qty 1

## 2020-12-29 NOTE — ED Provider Notes (Signed)
[facility]  [facility] Emergency Department Provider Note  ____________________________________________   Event Date/Time   First MD Initiated Contact with Patient 12/29/20 1124     (approximate)  I have reviewed the triage vital signs and the nursing notes.   HISTORY  Chief Complaint Weakness    HPI Cortavious Nix is a 50 y.o. male with prior stroke and seizures who comes in for concern for weakness.  Patient states is having intermittent episodes of weakness.  He states that he will develop these episodes where he feels like his left side of his face is drooping he will feel mostly weak throughout his body usually have some slurred speech.  These episodes occur intermittently, unclear what brings it on, get better on their own.  States that he did have a fall on Tuesday secondary to his whole body going week.  He does not think he hit his head.  Patient states that he is been noncompliant with his seizure medication as well as his blood pressure medication.  He is not on any blood thinners.  Never loses consciousness.          Past Medical History:  Diagnosis Date  . Hypercholesteremia   . Hypertension   . Seizures (HCC)   . Stroke [facility])     Patient Active Problem List   Diagnosis Date Noted  . Hypertension 11/07/2017  . Hyperlipidemia 11/07/2017  . Seizures (HCC) 11/07/2017  . History of stroke 11/07/2017  . Submandibular abscess 11/07/2017    No past surgical history on file.  Prior to Admission medications   Medication Sig Start Date End Date Taking? Authorizing Provider  amLODipine (NORVASC) 10 MG tablet Take 1 tablet (10 mg total) by mouth daily. 12/10/18   Virl Axe, MD  aspirin 81 MG tablet Take 1 tablet (81 mg total) by mouth daily. 11/01/17   Kallie Locks, FNP  atenolol (TENORMIN) 100 MG tablet Take 1 tablet (100 mg total) by mouth daily. 11/19/18   Virl Axe, MD  hydrochlorothiazide (HYDRODIURIL) 25 MG tablet Take 1 tablet (25  mg total) by mouth daily. 11/19/18   Virl Axe, MD  levETIRAcetam (KEPPRA) 1000 MG tablet Take 1 tablet (1,000 mg total) by mouth 2 (two) times daily. 01/06/18   Penumalli, Glenford Bayley, MD  losartan (COZAAR) 100 MG tablet Take 100 mg by mouth daily.    [provider]  omeprazole (PRILOSEC) 20 MG capsule TAKE ONE CAPSULE BY MOUTH EVERY DAY 02/25/19   Virl Axe, MD    Allergies Patient has no known allergies.  No family history on file.  Social History Social History   Tobacco Use  . Smoking status: Current Some Day Smoker    Packs/day: 0.33  . Smokeless tobacco: Never Used  Vaping Use  . Vaping Use: Never used  Substance Use Topics  . Alcohol use: Yes    Comment: occasionally  . Drug use: No      Review of Systems Constitutional: No fever/chills Eyes: No visual changes. ENT: No sore throat. Cardiovascular: Denies chest pain. Respiratory: Denies shortness of breath. Gastrointestinal: No abdominal pain.  No nausea, no vomiting.  No diarrhea.  No constipation. Genitourinary: Negative for dysuria. Musculoskeletal: Negative for back pain. Skin: Negative for rash. Neurological: Global weakness, left-sided facial droop All other ROS negative ____________________________________________   PHYSICAL EXAM:  VITAL SIGNS: ED Triage Vitals  Enc Vitals Group     BP 12/29/20 1121 (!) 169/104     Pulse Rate  12/29/20 1121 (!) 101     Resp --      Temp 12/29/20 1121 99 F (37.2 C)     Temp Source 12/29/20 1121 Oral     SpO2 12/29/20 1121 99 %     Weight --      Height 12/29/20 1124 5\' 11"  (1.803 m)     Head Circumference --      Peak Flow --      Pain Score 12/29/20 1124 0     Pain Loc --      Pain Edu? --      Excl. in GC? --     Constitutional: Alert and oriented. Well appearing and in no acute distress. Eyes: Conjunctivae are normal. EOMI. Head: Atraumatic. Nose: No congestion/rhinnorhea. Mouth/Throat: Mucous membranes are moist.   Neck: No stridor.  Trachea Midline. FROM Cardiovascular: Normal rate, regular rhythm. Grossly normal heart sounds.  Good peripheral circulation. Respiratory: Normal respiratory effort.  No retractions. Lungs CTAB. Gastrointestinal: Soft and nontender. No distention. No abdominal bruits.  Musculoskeletal: No lower extremity tenderness nor edema.  No joint effusions. Neurologic:  Normal speech and language. No gross focal neurologic deficits are appreciated.  Cranial 2 through 12 are intact.  Equal strength in arms and legs.  NIH stroke scale of 0 Skin:  Skin is warm, dry and intact. No rash noted. Psychiatric: Mood and affect are normal. Speech and behavior are normal. GU: Deferred   ____________________________________________   LABS (all labs ordered are listed, but only abnormal results are displayed)  Labs Reviewed  CBC WITH DIFFERENTIAL/PLATELET - Abnormal; Notable for the following components:      Result Value   RBC 4.18 (*)    All other components within normal limits  COMPREHENSIVE METABOLIC PANEL - Abnormal; Notable for the following components:   Glucose, Bld 109 (*)    BUN 25 (*)    Creatinine, Ser 1.64 (*)    AST 48 (*)    ALT 58 (*)    GFR, Estimated 51 (*)    All other components within normal limits  TROPONIN I (HIGH SENSITIVITY) - Abnormal; Notable for the following components:   Troponin I (High Sensitivity) 22 (*)    All other components within normal limits  MAGNESIUM  TSH  T4, FREE   ____________________________________________   ED ECG REPORT I, 12/31/20, the attending physician, personally viewed and interpreted this ECG.  Normal sinus rate of 83, no ST elevation but does have T wave version 2 3 aVF V5 and V6 ____________________________________________  RADIOLOGY   Official radiology report(s): CT Head Wo Contrast  Result Date: 12/29/2020 CLINICAL DATA:  Head trauma, abnormal mental status. Neck trauma, dangerous injury mechanism. Additional history provided:  Bilateral leg weakness starting last week with multiple falls, history of strokes in 2017, past history of seizures. EXAM: CT HEAD WITHOUT CONTRAST CT CERVICAL SPINE WITHOUT CONTRAST TECHNIQUE: Multidetector CT imaging of the head and cervical spine was performed following the standard protocol without intravenous contrast. Multiplanar CT image reconstructions of the cervical spine were also generated. COMPARISON:  Head CT 06/29/2017. FINDINGS: CT HEAD FINDINGS Brain: Redemonstrated chronic cortical/subcortical right parietooccipital lobe infarct. Background cerebral volume is normal. There is no acute intracranial hemorrhage. No acute demarcated cortical infarct. No extra-axial fluid collection. No evidence of intracranial mass. No midline shift. Partially empty sella turcica. Vascular: No hyperdense vessel.  Atherosclerotic calcifications. Skull: Normal. Negative for fracture or focal lesion. Sinuses/Orbits: Visualized orbits show no acute finding. Near complete opacification of  the left frontal sinus and left anterior ethmoid air cells. Complete opacification of the imaged left maxillary sinus. Frothy secretions and tiny mucous retention cyst within the right sphenoid sinus. CT CERVICAL SPINE FINDINGS Alignment: Reversal of the expected cervical lordosis. Trace C2-C3 and C3-C4 grade 1 anterolisthesis. Skull base and vertebrae: The basion-dental and atlanto-dental intervals are maintained.No evidence of acute fracture to the cervical spine. Soft tissues and spinal canal: No prevertebral fluid or swelling. No visible canal hematoma. Disc levels: Cervical spondylosis. Levels of mild disc space narrowing greatest at C4-C5 and C5-C6. Shallow multilevel disc bulges. Minimal uncovertebral hypertrophy at C4-C5 and C5-C6. No appreciable significant spinal canal stenosis. No significant bony neural foraminal narrowing. Ventral osteophytes at C4-C5 and C5-C6. Facet joint ankylosis on the right at C2-C3. Upper chest: No  consolidation within the imaged lung apices. No visible pneumothorax. IMPRESSION: CT head: 1. No evidence of acute intracranial abnormality. 2. 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 3. Paranasal sinus disease. Most notably, there is near complete opacification of the left frontal sinus and anterior left ethmoid air cells, as well as complete opacification of the imaged left maxillary sinus (left ostiomeatal unit obstructive pattern). Right sphenoid sinusitis is also notable. CT cervical spine: 1. No evidence of acute fracture to the cervical spine. 2. Nonspecific reversal of the expected cervical lordosis. 3. Mild C2-C3 and C3-C4 grade 1 anterolisthesis. 4. Cervical spondylosis as described. 5. Right-sided facet joint ankylosis at C2-C3. Electronically Signed   By: Jackey Loge DO   On: 12/29/2020 12:29   CT Cervical Spine Wo Contrast  Result Date: 12/29/2020 CLINICAL DATA:  Head trauma, abnormal mental status. Neck trauma, dangerous injury mechanism. Additional history provided: Bilateral leg weakness starting last week with multiple falls, history of strokes in 2017, past history of seizures. EXAM: CT HEAD WITHOUT CONTRAST CT CERVICAL SPINE WITHOUT CONTRAST TECHNIQUE: Multidetector CT imaging of the head and cervical spine was performed following the standard protocol without intravenous contrast. Multiplanar CT image reconstructions of the cervical spine were also generated. COMPARISON:  Head CT 06/29/2017. FINDINGS: CT HEAD FINDINGS Brain: Redemonstrated chronic cortical/subcortical right parietooccipital lobe infarct. Background cerebral volume is normal. There is no acute intracranial hemorrhage. No acute demarcated cortical infarct. No extra-axial fluid collection. No evidence of intracranial mass. No midline shift. Partially empty sella turcica. Vascular: No hyperdense vessel.  Atherosclerotic calcifications. Skull: Normal. Negative for fracture or focal lesion.  Sinuses/Orbits: Visualized orbits show no acute finding. Near complete opacification of the left frontal sinus and left anterior ethmoid air cells. Complete opacification of the imaged left maxillary sinus. Frothy secretions and tiny mucous retention cyst within the right sphenoid sinus. CT CERVICAL SPINE FINDINGS Alignment: Reversal of the expected cervical lordosis. Trace C2-C3 and C3-C4 grade 1 anterolisthesis. Skull base and vertebrae: The basion-dental and atlanto-dental intervals are maintained.No evidence of acute fracture to the cervical spine. Soft tissues and spinal canal: No prevertebral fluid or swelling. No visible canal hematoma. Disc levels: Cervical spondylosis. Levels of mild disc space narrowing greatest at C4-C5 and C5-C6. Shallow multilevel disc bulges. Minimal uncovertebral hypertrophy at C4-C5 and C5-C6. No appreciable significant spinal canal stenosis. No significant bony neural foraminal narrowing. Ventral osteophytes at C4-C5 and C5-C6. Facet joint ankylosis on the right at C2-C3. Upper chest: No consolidation within the imaged lung apices. No visible pneumothorax. IMPRESSION: CT head: 1. No evidence of acute intracranial abnormality. 2. 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 3. Paranasal sinus disease. Most notably, there is near complete opacification of the  left frontal sinus and anterior left ethmoid air cells, as well as complete opacification of the imaged left maxillary sinus (left ostiomeatal unit obstructive pattern). Right sphenoid sinusitis is also notable. CT cervical spine: 1. No evidence of acute fracture to the cervical spine. 2. Nonspecific reversal of the expected cervical lordosis. 3. Mild C2-C3 and C3-C4 grade 1 anterolisthesis. 4. Cervical spondylosis as described. 5. Right-sided facet joint ankylosis at C2-C3. Electronically Signed   By: Jackey LogeKyle  Golden DO   On: 12/29/2020 12:29     ____________________________________________   PROCEDURES  Procedure(s) performed (including Critical Care):  .1-3 Lead EKG Interpretation Performed by: Concha SeFunke, Javonna Balli E, MD Authorized by: Concha SeFunke, Angalena Cousineau E, MD     Interpretation: normal     ECG rate:  90s    ECG rate assessment: normal     Rhythm: sinus rhythm     Ectopy: none     Conduction: normal       ____________________________________________   INITIAL IMPRESSION / ASSESSMENT AND PLAN / ED COURSE  Bertis RuddyCharles Pugsley was evaluated in Emergency Department on 12/29/2020 for the symptoms described in the history of present illness. He was evaluated in the context of the global COVID-19 pandemic, which necessitated consideration that the patient might be at risk for infection with the SARS-CoV-2 virus that causes COVID-19. Institutional protocols and algorithms that pertain to the evaluation of patients at risk for COVID-19 are in a state of rapid change based on information released by regulatory bodies including the CDC and federal and state organizations. These policies and algorithms were followed during the patient's care in the ED.    Patient is a 50 year old with intermittent neurological symptoms that could be related to a stroke, seizure although seems less likely given no loss of consciousness.  Patient does report drinking alcohol as well so could be related to that.  Patient was not initially hypertensive but then became hypertensive so could be related to that as well.  Labs were ordered to evaluate for Electra abnormalities, AKI.  Given he did have a fall I will get a CT head evaluate for intercranial hemorrhage and CT cervical to evaluate for cervical fracture.  CT imaging was negative and labs are reassuring.  Slight elevation in his kidney functions I gave him some fluids.  Troponin slightly elevated but no chest pain I suspect is more likely just demand from elevated kidney function.  LFTs are slightly elevated but  patient does report drinking alcohol daily denies a history of withdrawal.  D/w Dr Dorothyann Pengbhaget from neurology.  Recommends admission for MRI.  See her note for more details.  We will discuss the hospital team for admission       ____________________________________________   FINAL CLINICAL IMPRESSION(S) / ED DIAGNOSES   Final diagnoses:  TIA (transient ischemic attack)  Weakness  AKI (acute kidney injury) (HCC)      MEDICATIONS GIVEN DURING THIS VISIT:  Medications  thiamine 500mg  in normal saline (50ml) IVPB (has no administration in time range)  vitamin B-12 (CYANOCOBALAMIN) tablet 1,000 mcg (has no administration in time range)  multivitamin with minerals tablet 1 tablet (has no administration in time range)  LORazepam (ATIVAN) injection 1 mg (has no administration in time range)  sodium chloride 0.9 % bolus 1,000 mL (0 mLs Intravenous Stopped 12/29/20 1346)  iohexol (OMNIPAQUE) 350 MG/ML injection 75 mL (75 mLs Intravenous Contrast Given 12/29/20 1433)     ED Discharge Orders    None       Note:  This  document was prepared using Conservation officer, historic buildings and may include unintentional dictation errors.   Concha Se, MD 12/29/20 [phone]

## 2020-12-29 NOTE — Progress Notes (Signed)
eeg done °

## 2020-12-29 NOTE — ED Triage Notes (Signed)
Pt states he fell Tuesday because of weakness in the legs; claims it starts in the head and gets confused and begins to get weak; hx of 3 strokes; hx of seizures says he usually passes out;

## 2020-12-29 NOTE — H&P (Signed)
History and Physical    Gary RuddyCharles [name] ZOX:[id number] DOB: Apr 29, 1971 DOA: 12/29/2020  Referring MD/NP/PA:   PCP: Patient, No Pcp Per   Patient coming from:  The patient is coming from home.  At baseline, pt is independent for most of ADL.        Chief Complaint: Weakness, left facial droop, slurred speech  HPI: Gary RuddyCharles Perot is a 50 y.o. male with medical history significant of hypertension, hyperlipidemia, stroke, GERD, seizure (self stopped Keppra 3 years ago, did not have seizure in past 3 years), alcohol abuse, tobacco abuse, CKD-2, who presents with weakness, left facial droop and slurred speech.  Patient states that she has weakness in all extremities intermittently for about 1 week.  He also has numbness in all extremities during the same period of time.  Today he developed left facial droop and slurred speech which has resolved at arrival to ED.  Patient was reportedly to have had confusion which has resolved.  Currently patient is alert, oriented x3.  No difficult swallowing or vision loss.  Patient has mild cough, no shortness breath, chest pain, fever or chills.  Denies nausea, vomiting, diarrhea, abdominal pain, symptoms of UTI.  ED Course: pt was found to have WBC 7.8, troponin level 22, 27, alcohol level less than 10, pending COVID-19 PCR, worsening renal function, temperature 99, blood pressure 206/128, heart rate 101, 99, RR 25, oxygen saturation 99% on room air.  Patient is placed on MedSurg bed for observation. Dr. Iver NestleBhagat of neuro is consulted.  MRI of brain showed: Approximately 5 punctate areas of acute infarct in the left frontal parietal cortex compatible small emboli. Chronic infarct right occipital lobe.   CT head: 1. No evidence of acute intracranial abnormality. 2. 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 3. Paranasal sinus disease. Most notably, there is near complete opacification of the left frontal sinus and  anterior left ethmoid air cells, as well as complete opacification of the imaged left maxillary sinus (left ostiomeatal unit obstructive pattern). Right sphenoid sinusitis is also notable.  CT cervical spine: 1. No evidence of acute fracture to the cervical spine. 2. Nonspecific reversal of the expected cervical lordosis. 3. Mild C2-C3 and C3-C4 grade 1 anterolisthesis. 4. Cervical spondylosis as described. 5. Right-sided facet joint ankylosis at C2-C3.  CTA neck: 1. The common and internal carotid arteries are patent within the neck. Soft and calcified plaque within the mid to distal cervical right ICA results in less than 50% ICA stenosis. 2. Vertebral arteries patent within the neck without stenosis.  CTA head: 1. No intracranial large vessel occlusion. 2. Intracranial atherosclerotic disease with multifocal stenoses, most notably as follows. 3. Moderate stenosis of the M1 left middle cerebral artery. 4. Apparent moderate stenosis within the cavernous right ICA. 5. Apparent mild/moderate stenosis within the cavernous left ICA  Review of Systems:   General: no fevers, chills, no body weight gain, has fatigue HEENT: no blurry vision, hearing changes or sore throat Respiratory: no dyspnea, has coughing, no wheezing CV: no chest pain, no palpitations GI: no nausea, vomiting, abdominal pain, diarrhea, constipation GU: no dysuria, burning on urination, increased urinary frequency, hematuria  Ext: no leg edema Neuro: Has weakness and numbness in all extremities.  Has left facial droop and slurred speech Skin: no rash, no skin tear. MSK: No muscle spasm, no deformity, no limitation of range of movement in spin Heme: No easy bruising.  Travel history: No recent long distant travel.  Allergy: No Known Allergies  Past Medical  History:  Diagnosis Date  . Alcohol use   . Hypercholesteremia   . Hypertension   . Seizures (HCC)   . Stroke (HCC)   . Tobacco abuse      History reviewed. No pertinent surgical history.  Social History:  reports that he has been smoking. He has been smoking about 0.33 packs per day. He has never used smokeless tobacco. He reports current alcohol use. He reports that he does not use drugs.  Family History:  Family History  Problem Relation Age of Onset  . Hypertension Mother      Prior to Admission medications   Medication Sig Start Date End Date Taking? Authorizing Provider  amLODipine (NORVASC) 10 MG tablet Take 1 tablet (10 mg total) by mouth daily. 12/10/18   Virl Axe, MD  aspirin 81 MG tablet Take 1 tablet (81 mg total) by mouth daily. 11/01/17   Kallie Locks, FNP  atenolol (TENORMIN) 100 MG tablet Take 1 tablet (100 mg total) by mouth daily. 11/19/18   Virl Axe, MD  hydrochlorothiazide (HYDRODIURIL) 25 MG tablet Take 1 tablet (25 mg total) by mouth daily. 11/19/18   Virl Axe, MD  levETIRAcetam (KEPPRA) 1000 MG tablet Take 1 tablet (1,000 mg total) by mouth 2 (two) times daily. 01/06/18   Penumalli, Glenford Bayley, MD  losartan (COZAAR) 100 MG tablet Take 100 mg by mouth daily.    [provider]  omeprazole (PRILOSEC) 20 MG capsule TAKE ONE CAPSULE BY MOUTH EVERY DAY 02/25/19   Virl Axe, MD    Physical Exam: Vitals:   12/29/20 1415 12/29/20 1515 12/29/20 1538 12/29/20 1820  BP: (!) 206/128 (!) 191/116 (!) 212/111 (!) 182/109  Pulse: 99 70 70 85  Resp: (!) 25 17 18  (!) 22  Temp:   98.9 F (37.2 C) 99.3 F (37.4 C)  TempSrc:   Oral   SpO2: 100% 100% 100% 100%  Weight:      Height:       General: Not in acute distress HEENT:       Eyes: PERRL, EOMI, no scleral icterus.       ENT: No discharge from the ears and nose, no pharynx injection, no tonsillar enlargement.        Neck: No JVD, no bruit, no mass felt. Heme: No neck lymph node enlargement. Cardiac: S1/S2, RRR, No murmurs, No gallops or rubs. Respiratory: No rales, wheezing, rhonchi or rubs. GI: Soft, nondistended,  nontender, no rebound pain, no organomegaly, BS present. GU: No hematuria Ext: No pitting leg edema bilaterally. 1+DP/PT pulse bilaterally. Musculoskeletal: No joint deformities, No joint redness or warmth, no limitation of ROM in spin. Skin: No rashes.  Neuro: Alert, oriented X3, cranial nerves II-XII grossly intact, moves all extremities normally. Muscle strength 5/5 in all extremities, sensation to light touch intact.  Psych: Patient is not psychotic, no suicidal or hemocidal ideation.  Labs on Admission: I have personally reviewed following labs and imaging studies  CBC: Recent Labs  Lab 12/29/20 1143  WBC 7.8  NEUTROABS 4.0  HGB 13.6  HCT 40.4  MCV 96.7  PLT 231   Basic Metabolic Panel: Recent Labs  Lab 12/29/20 1143  NA 137  K 4.2  CL 106  CO2 22  GLUCOSE 109*  BUN 25*  CREATININE 1.64*  CALCIUM 9.4  MG 2.3   GFR: Estimated Creatinine Clearance: 66.7 mL/min (A) (by C-G formula based on SCr of 1.64 mg/dL (H)). Liver Function Tests: Recent Labs  Lab  12/29/20 1143  AST 48*  ALT 58*  ALKPHOS 76  BILITOT 0.7  PROT 8.0  ALBUMIN 4.3   No results for input(s): LIPASE, AMYLASE in the last 168 hours. No results for input(s): AMMONIA in the last 168 hours. Coagulation Profile: No results for input(s): INR, PROTIME in the last 168 hours. Cardiac Enzymes: No results for input(s): CKTOTAL, CKMB, CKMBINDEX, TROPONINI in the last 168 hours. BNP (last 3 results) No results for input(s): PROBNP in the last 8760 hours. HbA1C: No results for input(s): HGBA1C in the last 72 hours. CBG: No results for input(s): GLUCAP in the last 168 hours. Lipid Profile: No results for input(s): CHOL, HDL, LDLCALC, TRIG, CHOLHDL, LDLDIRECT in the last 72 hours. Thyroid Function Tests: Recent Labs    12/29/20 1143  TSH 0.474  FREET4 0.82   Anemia Panel: No results for input(s): VITAMINB12, FOLATE, FERRITIN, TIBC, IRON, RETICCTPCT in the last 72 hours. Urine analysis:     Component Value Date/Time   COLORURINE YELLOW (A) 06/29/2017 1045   APPEARANCEUR Turbid (A) 11/05/2018 0000   LABSPEC 1.019 06/29/2017 1045   PHURINE 5.0 06/29/2017 1045   GLUCOSEU Negative 11/05/2018 0000   HGBUR NEGATIVE 06/29/2017 1045   BILIRUBINUR Negative 11/05/2018 0000   KETONESUR NEGATIVE 06/29/2017 1045   PROTEINUR Negative 11/05/2018 0000   PROTEINUR NEGATIVE 06/29/2017 1045   NITRITE Negative 11/05/2018 0000   NITRITE NEGATIVE 06/29/2017 1045   LEUKOCYTESUR Negative 11/05/2018 0000   Sepsis Labs: @LABRCNTIP (procalcitonin:4,lacticidven:4) )No results found for this or any previous visit (from the past 240 hour(s)).   Radiological Exams on Admission: CT ANGIO HEAD W OR WO CONTRAST  Result Date: 12/29/2020 CLINICAL DATA:  Transient ischemic attack (TIA). Additional history provided: Patient reports fall on Tuesday due to weakness in legs, history of 3 prior strokes, history of seizure. EXAM: CT ANGIOGRAPHY HEAD AND NECK TECHNIQUE: Multidetector CT imaging of the head and neck was performed using the standard protocol during bolus administration of intravenous contrast. Multiplanar CT image reconstructions and MIPs were obtained to evaluate the vascular anatomy. Carotid stenosis measurements (when applicable) are obtained utilizing NASCET criteria, using the distal internal carotid diameter as the denominator. CONTRAST:  60mL OMNIPAQUE IOHEXOL 350 MG/ML SOLN COMPARISON:  Head CT 12/29/2020. FINDINGS: CTA NECK FINDINGS Aortic arch: Common origin of the innominate and left common carotid arteries. The visualized aortic arch is normal in caliber. No hemodynamically significant innominate or proximal subclavian artery stenosis. Right carotid system: CCA and ICA patent within the neck. Soft calcified plaque within the mid to distal CCA results in less than 50% ICA stenosis. Left carotid system: CCA and ICA patent within the neck without stenosis. No significant atherosclerotic disease.  Vertebral arteries: Patent within the neck without stenosis. Left vertebral artery dominant. Skeleton: Redemonstrated trace C2-C3 and C3-C4 grade 1 anterolisthesis. Nonspecific reversal of the expected cervical lordosis. Cervical spondylosis. Facet joint ankylosis on the right at C2-C3. No acute bony abnormality or aggressive osseous lesion. Other neck: No neck mass or cervical lymphadenopathy. Thyroid unremarkable. Upper chest: No consolidation within the imaged lung apices. Review of the MIP images confirms the above findings CTA HEAD FINDINGS Anterior circulation: The intracranial internal carotid arteries are patent. Atherosclerotic plaque within both vessels. Apparent moderate stenosis within the cavernous right ICA. Apparent mild/moderate stenosis within the cavernous left ICA. The M1 middle cerebral arteries are patent. Moderate stenosis of the M1 left middle cerebral artery. Atherosclerotic irregularity of the M2 and more distal MCA branch vessels bilaterally. No M2 proximal branch occlusion  or high-grade proximal stenosis is identified. The A1 right anterior cerebral artery is hypoplastic or absent. The anterior cerebral arteries are otherwise patent without significant stenosis. No intracranial aneurysm is identified. Posterior circulation: The intracranial left vertebral artery is developmentally diminutive, but patent. The dominant intracranial left vertebral artery is patent without stenosis. The basilar artery is patent. The posterior cerebral arteries are patent. Predominantly fetal origin right posterior cerebral artery. A left posterior communicating artery is also present. Venous sinuses: Within the limitations of contrast timing, no convincing thrombus. Anatomic variants: As described Review of the MIP images confirms the above findings IMPRESSION: CTA neck: 1. The common and internal carotid arteries are patent within the neck. Soft and calcified plaque within the mid to distal cervical right ICA  results in less than 50% ICA stenosis. 2. Vertebral arteries patent within the neck without stenosis. CTA head: 1. No intracranial large vessel occlusion. 2. Intracranial atherosclerotic disease with multifocal stenoses, most notably as follows. 3. Moderate stenosis of the M1 left middle cerebral artery. 4. Apparent moderate stenosis within the cavernous right ICA. 5. Apparent mild/moderate stenosis within the cavernous left ICA. Electronically Signed   By: Jackey Loge DO   On: 12/29/2020 15:14   CT Head Wo Contrast  Result Date: 12/29/2020 CLINICAL DATA:  Head trauma, abnormal mental status. Neck trauma, dangerous injury mechanism. Additional history provided: Bilateral leg weakness starting last week with multiple falls, history of strokes in 2017, past history of seizures. EXAM: CT HEAD WITHOUT CONTRAST CT CERVICAL SPINE WITHOUT CONTRAST TECHNIQUE: Multidetector CT imaging of the head and cervical spine was performed following the standard protocol without intravenous contrast. Multiplanar CT image reconstructions of the cervical spine were also generated. COMPARISON:  Head CT 06/29/2017. FINDINGS: CT HEAD FINDINGS Brain: Redemonstrated chronic cortical/subcortical right parietooccipital lobe infarct. Background cerebral volume is normal. There is no acute intracranial hemorrhage. No acute demarcated cortical infarct. No extra-axial fluid collection. No evidence of intracranial mass. No midline shift. Partially empty sella turcica. Vascular: No hyperdense vessel.  Atherosclerotic calcifications. Skull: Normal. Negative for fracture or focal lesion. Sinuses/Orbits: Visualized orbits show no acute finding. Near complete opacification of the left frontal sinus and left anterior ethmoid air cells. Complete opacification of the imaged left maxillary sinus. Frothy secretions and tiny mucous retention cyst within the right sphenoid sinus. CT CERVICAL SPINE FINDINGS Alignment: Reversal of the expected cervical  lordosis. Trace C2-C3 and C3-C4 grade 1 anterolisthesis. Skull base and vertebrae: The basion-dental and atlanto-dental intervals are maintained.No evidence of acute fracture to the cervical spine. Soft tissues and spinal canal: No prevertebral fluid or swelling. No visible canal hematoma. Disc levels: Cervical spondylosis. Levels of mild disc space narrowing greatest at C4-C5 and C5-C6. Shallow multilevel disc bulges. Minimal uncovertebral hypertrophy at C4-C5 and C5-C6. No appreciable significant spinal canal stenosis. No significant bony neural foraminal narrowing. Ventral osteophytes at C4-C5 and C5-C6. Facet joint ankylosis on the right at C2-C3. Upper chest: No consolidation within the imaged lung apices. No visible pneumothorax. IMPRESSION: CT head: 1. No evidence of acute intracranial abnormality. 2. 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 3. Paranasal sinus disease. Most notably, there is near complete opacification of the left frontal sinus and anterior left ethmoid air cells, as well as complete opacification of the imaged left maxillary sinus (left ostiomeatal unit obstructive pattern). Right sphenoid sinusitis is also notable. CT cervical spine: 1. No evidence of acute fracture to the cervical spine. 2. Nonspecific reversal of the expected cervical lordosis. 3. Mild C2-C3 and  C3-C4 grade 1 anterolisthesis. 4. Cervical spondylosis as described. 5. Right-sided facet joint ankylosis at C2-C3. Electronically Signed   By: Jackey Loge DO   On: 12/29/2020 12:29   CT ANGIO NECK W OR WO CONTRAST  Result Date: 12/29/2020 CLINICAL DATA:  Transient ischemic attack (TIA). Additional history provided: Patient reports fall on Tuesday due to weakness in legs, history of 3 prior strokes, history of seizure. EXAM: CT ANGIOGRAPHY HEAD AND NECK TECHNIQUE: Multidetector CT imaging of the head and neck was performed using the standard protocol during bolus administration of  intravenous contrast. Multiplanar CT image reconstructions and MIPs were obtained to evaluate the vascular anatomy. Carotid stenosis measurements (when applicable) are obtained utilizing NASCET criteria, using the distal internal carotid diameter as the denominator. CONTRAST:  75mL OMNIPAQUE IOHEXOL 350 MG/ML SOLN COMPARISON:  Head CT 12/29/2020. FINDINGS: CTA NECK FINDINGS Aortic arch: Common origin of the innominate and left common carotid arteries. The visualized aortic arch is normal in caliber. No hemodynamically significant innominate or proximal subclavian artery stenosis. Right carotid system: CCA and ICA patent within the neck. Soft calcified plaque within the mid to distal CCA results in less than 50% ICA stenosis. Left carotid system: CCA and ICA patent within the neck without stenosis. No significant atherosclerotic disease. Vertebral arteries: Patent within the neck without stenosis. Left vertebral artery dominant. Skeleton: Redemonstrated trace C2-C3 and C3-C4 grade 1 anterolisthesis. Nonspecific reversal of the expected cervical lordosis. Cervical spondylosis. Facet joint ankylosis on the right at C2-C3. No acute bony abnormality or aggressive osseous lesion. Other neck: No neck mass or cervical lymphadenopathy. Thyroid unremarkable. Upper chest: No consolidation within the imaged lung apices. Review of the MIP images confirms the above findings CTA HEAD FINDINGS Anterior circulation: The intracranial internal carotid arteries are patent. Atherosclerotic plaque within both vessels. Apparent moderate stenosis within the cavernous right ICA. Apparent mild/moderate stenosis within the cavernous left ICA. The M1 middle cerebral arteries are patent. Moderate stenosis of the M1 left middle cerebral artery. Atherosclerotic irregularity of the M2 and more distal MCA branch vessels bilaterally. No M2 proximal branch occlusion or high-grade proximal stenosis is identified. The A1 right anterior cerebral artery  is hypoplastic or absent. The anterior cerebral arteries are otherwise patent without significant stenosis. No intracranial aneurysm is identified. Posterior circulation: The intracranial left vertebral artery is developmentally diminutive, but patent. The dominant intracranial left vertebral artery is patent without stenosis. The basilar artery is patent. The posterior cerebral arteries are patent. Predominantly fetal origin right posterior cerebral artery. A left posterior communicating artery is also present. Venous sinuses: Within the limitations of contrast timing, no convincing thrombus. Anatomic variants: As described Review of the MIP images confirms the above findings IMPRESSION: CTA neck: 1. The common and internal carotid arteries are patent within the neck. Soft and calcified plaque within the mid to distal cervical right ICA results in less than 50% ICA stenosis. 2. Vertebral arteries patent within the neck without stenosis. CTA head: 1. No intracranial large vessel occlusion. 2. Intracranial atherosclerotic disease with multifocal stenoses, most notably as follows. 3. Moderate stenosis of the M1 left middle cerebral artery. 4. Apparent moderate stenosis within the cavernous right ICA. 5. Apparent mild/moderate stenosis within the cavernous left ICA. Electronically Signed   By: Jackey Loge DO   On: 12/29/2020 15:14   CT Cervical Spine Wo Contrast  Result Date: 12/29/2020 CLINICAL DATA:  Head trauma, abnormal mental status. Neck trauma, dangerous injury mechanism. Additional history provided: Bilateral leg weakness starting last week  with multiple falls, history of strokes in 2017, past history of seizures. EXAM: CT HEAD WITHOUT CONTRAST CT CERVICAL SPINE WITHOUT CONTRAST TECHNIQUE: Multidetector CT imaging of the head and cervical spine was performed following the standard protocol without intravenous contrast. Multiplanar CT image reconstructions of the cervical spine were also generated.  COMPARISON:  Head CT 06/29/2017. FINDINGS: CT HEAD FINDINGS Brain: Redemonstrated chronic cortical/subcortical right parietooccipital lobe infarct. Background cerebral volume is normal. There is no acute intracranial hemorrhage. No acute demarcated cortical infarct. No extra-axial fluid collection. No evidence of intracranial mass. No midline shift. Partially empty sella turcica. Vascular: No hyperdense vessel.  Atherosclerotic calcifications. Skull: Normal. Negative for fracture or focal lesion. Sinuses/Orbits: Visualized orbits show no acute finding. Near complete opacification of the left frontal sinus and left anterior ethmoid air cells. Complete opacification of the imaged left maxillary sinus. Frothy secretions and tiny mucous retention cyst within the right sphenoid sinus. CT CERVICAL SPINE FINDINGS Alignment: Reversal of the expected cervical lordosis. Trace C2-C3 and C3-C4 grade 1 anterolisthesis. Skull base and vertebrae: The basion-dental and atlanto-dental intervals are maintained.No evidence of acute fracture to the cervical spine. Soft tissues and spinal canal: No prevertebral fluid or swelling. No visible canal hematoma. Disc levels: Cervical spondylosis. Levels of mild disc space narrowing greatest at C4-C5 and C5-C6. Shallow multilevel disc bulges. Minimal uncovertebral hypertrophy at C4-C5 and C5-C6. No appreciable significant spinal canal stenosis. No significant bony neural foraminal narrowing. Ventral osteophytes at C4-C5 and C5-C6. Facet joint ankylosis on the right at C2-C3. Upper chest: No consolidation within the imaged lung apices. No visible pneumothorax. IMPRESSION: CT head: 1. No evidence of acute intracranial abnormality. 2. 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 3. Paranasal sinus disease. Most notably, there is near complete opacification of the left frontal sinus and anterior left ethmoid air cells, as well as complete opacification of the  imaged left maxillary sinus (left ostiomeatal unit obstructive pattern). Right sphenoid sinusitis is also notable. CT cervical spine: 1. No evidence of acute fracture to the cervical spine. 2. Nonspecific reversal of the expected cervical lordosis. 3. Mild C2-C3 and C3-C4 grade 1 anterolisthesis. 4. Cervical spondylosis as described. 5. Right-sided facet joint ankylosis at C2-C3. Electronically Signed   By: Jackey Loge DO   On: 12/29/2020 12:29   MR BRAIN WO CONTRAST  Result Date: 12/29/2020 CLINICAL DATA:  Acute neuro deficit.  Fall, weakness. EXAM: MRI HEAD WITHOUT CONTRAST TECHNIQUE: Multiplanar, multiecho pulse sequences of the brain and surrounding structures were obtained without intravenous contrast. COMPARISON:  CT head 12/29/2020 FINDINGS: Brain: Multiple small areas of acute infarct in the left frontal and parietal cortex compatible with small embolic infarcts. Chronic infarct right occipital lobe. Few small white matter hyperintensities appear chronic. Negative for hemorrhage or mass. Ventricle size normal. Vascular: Normal arterial flow voids Skull and upper cervical spine: Negative Sinuses/Orbits: Complete opacification left maxillary sinus and left ethmoid and frontal sinuses. Negative orbit Other: None IMPRESSION: Approximately 5 punctate areas of acute infarct in the left frontal parietal cortex compatible small emboli. Chronic infarct right occipital lobe. Electronically Signed   By: Marlan Palau M.D.   On: 12/29/2020 15:07     EKG: I have personally reviewed.  Sinus rhythm, QTC 402, T wave inversion in the inferior leads and V5-V6  Assessment/Plan Principal Problem:   Stroke [facility]) Active Problems:   Hypertension   Hyperlipidemia   Seizures (HCC)   Elevated troponin   Alcohol abuse   Tobacco abuse   Acute renal failure superimposed  on stage 2 chronic kidney disease (HCC)   Stroke [facility]): MRI of brain showed an approximately 5 punctate areas of acute infarct in the left  frontal parietal cortex compatible small emboli. CTA has no LVO. Dr. Iver Nestle of neuro is consulted.   - Placed on MedSurg bed for observation - started Plavix and add ASA  - start lipitor 40 mg daily - fasting lipid panel and HbA1c  - 2D transthoracic echocardiography  - swallowing screen. If fails, will get SLP - Check UDS  - PT/OT consult  Hypertension: Bp 206/128. Will gradually lower blood pressure -IV hydralazine as needed for SBP>200 or dBP> 110 -continue home amlodipine -Hold atenolol and HCTZ, Cozaar  Hyperlipidemia -Lipitor  Seizure: Patient self stopped Keppra 3 years ago, did not have seizure in the past 3 years. -Seizure precaution -When necessary Ativan for seizure -Per neuro, will need to restart Keppra after complete EEG (not ordered yet)  Elevated troponin: No chest pain, likely due to demand ischemia.  Troponin 22, 27. -Trend troponin -A1c, FLP, UDS -Patient is on aspirin and Plavix, Lipitor  Tobacco abuse and Alcohol abuse: -Did counseling about importance of quitting smoking and drinking alcohol -Nicotine patch -CIWA protocol -pt was given 500 mg of thiamine, vitamin B12 1000 mg per neurology  Acute renal failure superimposed on stage 2 chronic kidney disease (HCC): Baseline creatinine 1.27, his creatinine is 1.64, BUN 25.  Likely due to dehydration. -Hold Zocor and HCTZ -Patient received 1 L normal saline   DVT ppx: SQ Lovenox Code Status: Full code Family Communication: not done, no family member is at bed side.     Disposition Plan:  Anticipate discharge back to previous environment Consults called:  Dr. Iver Nestle of neuro Admission status and Level of care: Med-Surg:     for obs    Status is: Observation  The patient remains OBS appropriate and will d/c before 2 midnights.  Dispo: The patient is from: Home              Anticipated d/c is to: Home              Patient currently is not medically stable to d/c.   Difficult to place patient  No         Date of Service 12/29/2020    Lorretta Harp [facility]   If 7PM-7AM, please contact night-coverage [email or web address] 12/29/2020, 6:38 PM

## 2020-12-29 NOTE — Consult Note (Addendum)
Neurology Consultation Reason for Consult: Bilateral leg weakness Requesting Physician: Artis Delay   CC:   History is obtained from: Patient and chart review  HPI: Gary [name] is a 50 y.o. male with a past medical history significant for stroke (right parietal, mild left-sided treated/weakness residual), high cholesterol, hypertension, seizures  Regarding his seizure history, he was evaluated by Dr. Pneumology in April 2019.  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  Neurological examination at the time was noted for slight giveaway weakness in the left arm and left leg as well as decreased sensation in the left arm and left leg, with hypoactive reflexes throughout.  At the time he was also having intermittent dizziness and lightheadedness.  His levetiracetam was increased to 1000 mg twice a day due to recent breakthrough seizure and there is plan for sleep study to rule out obstructive sleep apnea  Today he is presenting with episodic drooping of the left face as well as generalized weakness and slurred speech that happened without a clear trigger.  He reports his past seizures presented with no warning and loss of consciousness.  He reports that his stroke work-up, EEG and MRI were completed in [place].  He was initially on disability after his stroke/seizures and then started working about 3 years ago at which time he also self discontinued his medications.  He is amenable to restarting medications.  Regarding recent events, he states that the majority of them have happened while he was standing and walking but 1 did occur while he was sitting.  Happened 3 times in the last week where he had an sudden onset of disorientation/confusion and then generalized weakness in his entire body.  He has to hold steady to what ever is nearby him until the sensation passes which takes about 5 to 10 minutes.  During 1 of these events when he attempted to walk  away from the car he was holding onto he had a fall.  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  He decided to come into the ED for evaluation today and had another event in triage which was associated with some left-sided facial droop and slurred speech.  LKW: Last week  tPA given?: No, out of the window  ROS: All other review of systems was negative except as noted in the HPI.   Past Medical History:  Diagnosis Date  . Hypercholesteremia   . Hypertension   . Seizures (HCC)   . Stroke [facility])    History reviewed. No pertinent surgical history.  He reports a family history of stroke  Social History:  reports that he has been smoking. He has been smoking about 0.33 packs per day. He has never used smokeless tobacco. He reports current alcohol use. He reports that he does not use drugs.  Exam: Current vital signs: BP (!) 183/103   Pulse 86   Temp 99 F (37.2 C) (Oral)   Resp 13   Ht 5\' 11"  (1.803 m)   Wt 103.4 kg   SpO2 99%   BMI 31.80 kg/m  Vital signs in last 24 hours: Temp:  [99 F (37.2 C)] 99 F (37.2 C) (03/24 1121) Pulse Rate:  [86-101] 86 (03/24 1255) Resp:  [13-20] 13 (03/24 1255) BP: (169-183)/(103-118) 183/103 (03/24 1255) SpO2:  [99 %-100 %] 99 % (03/24 1255) Weight:  [103.4 kg] 103.4 kg (03/24 1126)   Physical Exam  Constitutional: Appears well-developed and well-nourished.  Psych: Affect appropriate to situation, calm and cooperative, slightly flat Eyes: No scleral injection HENT: No oropharyngeal obstruction.  MSK: no joint deformities.  Cardiovascular: Normal rate and regular rhythm.  Respiratory: Effort normal, non-labored breathing GI: Soft.  No distension. There is no tenderness.  Skin: Warm dry and intact visible skin  Neuro: Mental Status: Patient is awake, alert, oriented to person, place, month, year, and situation. Patient is able to give a clear and coherent history. No signs of aphasia or  neglect Cranial Nerves: II: Visual Fields are full. Pupils are equal, round, and reactive to light.   III,IV, VI: EOMI without ptosis or diploplia. Smooth pursuits V: Facial sensation is symmetric to temperature VII: Facial movement is notable for mild L NLF at rest (baseline per patient) VIII: hearing is intact to voice X: Uvula elevates symmetrically XI: Shoulder shrug is symmetric. XII: tongue is midline without atrophy or fasciculations.  Motor: Tone is normal. Bulk is normal. 5/5 strength was present in all four extremities except 4/5 left hip flexor weakness  Sensory: Sensation is symmetric to light touch and temperature in the arms and legs. Deep Tendon Reflexes: 2+ and symmetric in the biceps and patellae (a little easier to elicit in LE than UE).  Cerebellar: FNF and HKS are intact bilaterally Gait:  Unsteady tandem gait, but able to rise on heels and toes  NIHSS total 0  I have reviewed labs in epic and the results pertinent to this consultation are: Cr 1.64 from 1.26 in 03/2019 BUN 25 from 24 prior  AST / ALT mild chronic elevation TSH and T4 WNL  Ethanol < 10 Trop 22 -> 27  I have reviewed the images obtained: Head CT w/ chronic R parietal stroke, no acute process   CTA with scattered atherosclerosis, no LVO, full radiology read pending MRI brain with scattered punctate strokes in the left posterior parietoccipital area  Impression: This is a 50 year old male with past medical history significant for prior embolic stroke of unknown certain etiology (right parietal lesion, minimal residual deficit of numbness of the left thumb), post stroke epilepsy, nonadherence to medications, ongoing tobacco use, and ongoing ethanol use presenting with transient episodes of whole body weakness.  Vascular localization based on symptoms would have to be posterior circulation/basilar artery, though imaging reveals more atherosclerotic disease of the anterior circulation in the posterior  circulation.  Scattered strokes on MRI brain in the left MCA/PCA territory.  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  However the remainder of his stroke work-up is still pending (echocardiogram, labs) and medications may need to be adjusted further based on this work-up  - Hypertensive emergency (neurological symptoms, mild troponin elevation)  - History of seizures, not on medications - History of stroke, embolic of unclear source   Recommendations:   # Transient Left facial droop, TIA vs. recrudesence due to hypertension vs. Focal seizure  > Found to have acute embolic appearing strokes on MRI (atheroemoblic vs cardioembolic) - CTA completed on my recommendations as above - MRI brain w/o completed on my recommendations as above - Telemetery  - A1c and lipids - ECHO w/ bubble study  - ASA 325 followed by 81 mg daily - Plavix 300 mg followed by 75 mg daily for 90 days given intracerebral atherosclerotic disease - Start atorvastatin 40 to 80 mg nightly if LDL is above goal of less than  70 - BP goal, gradually normalize over 3-5 days with goal of normotension - Frequent neurochecks per protocol - Neurology will follow up results of echocardiogram and LDL  # Seizure history, self-discontinued meds  - Routine EEG - keppra 500 mg BID starting after EEG completed   # Heavy alcohol use - B6, B12, B1 levels; primary team or primary care physician to follow-up results and supplement as needed - Multivitamin and empiric thiamine (500 mcg once) and b12 (1000 mcg p.o. daily) pending levels  - CIWAS protocol per primary team   Brooke Dare MD-PhD [facility] [phone] [facility] coverage for [facility] is from 8 AM to 4 AM in-house and 4 PM to 8 PM by telephone/video. 8 PM to 8 AM emergent questions or overnight urgent questions should be addressed to Teleneurology  On-call or [facility] neurohospitalist; contact information can be found on AMION

## 2020-12-29 NOTE — Procedures (Signed)
This is a routine inpatient EEG performed on: December 29, 2020  Clinical indication: Seizure disorder spell of slurred speech and weakness with altered mental status   History: The patient is a 50 year old man with a history of hypertension, hyperlipidemia, prior stroke, seizure disorder off antiepileptics who presented with confusion and left-sided weakness.  Introduction: This is a routine inpatient EEG performed using the standard 10-20 system of electrode placement with 21 channels of EEG and a single channel of EKG monitoring.  Approximately  27:49   minutes of EEG captured.  Photic stimulation but not hyperventilation was performed.   Description of the record:  The awake background is continuous and symmetric characterized by a well formed 10-11 hertz posterior dominant rhythm.  Brief stage II sleep architecture seen with bisynchronous sleep spindles.  No seizures, epileptiform discharges, or evolving ictal patterns seen.   Photic stimulation did not produce any abnormal activation.  The single-channel EKG shows a heart rate in the 80 BPM  range   Impression: Normal awake and asleep routine EEG. No seizures, epileptiform discharges, or evolving ictal patterns seen

## 2020-12-29 NOTE — Progress Notes (Signed)
PHARMACIST - PHYSICIAN COMMUNICATION  CONCERNING:  Enoxaparin (Lovenox) for DVT Prophylaxis    RECOMMENDATION: Patient was prescribed enoxaprin 40mg  q24 hours for VTE prophylaxis.   Filed Weights   12/29/20 1126  Weight: 103.4 kg (228 lb)    Body mass index is 31.8 kg/m.  Estimated Creatinine Clearance: 66.7 mL/min (A) (by C-G formula based on SCr of 1.64 mg/dL (H)).   Based on [facility] policy patient is candidate for enoxaparin 0.5mg /kg TBW SQ every 24 hours based on BMI being >30.  DESCRIPTION: Pharmacy has adjusted enoxaparin dose per [facility] policy.  Patient is now receiving enoxaparin 52.5 mg every 24 hours    [facility], PharmD Clinical Pharmacist  12/29/2020 4:23 PM

## 2020-12-30 ENCOUNTER — Observation Stay (HOSPITAL_BASED_OUTPATIENT_CLINIC_OR_DEPARTMENT_OTHER)
Admit: 2020-12-30 | Discharge: 2020-12-30 | Disposition: A | Discharge status: Home / Self Care | Payer: Self-pay | Attending: Internal Medicine | Admitting: Internal Medicine

## 2020-12-30 ENCOUNTER — Other Ambulatory Visit: Payer: Self-pay | Admitting: Internal Medicine

## 2020-12-30 DIAGNOSIS — N179 Acute kidney failure, unspecified: Secondary | ICD-10-CM

## 2020-12-30 DIAGNOSIS — K219 Gastro-esophageal reflux disease without esophagitis: Secondary | ICD-10-CM

## 2020-12-30 DIAGNOSIS — I6389 Other cerebral infarction: Secondary | ICD-10-CM

## 2020-12-30 DIAGNOSIS — N182 Chronic kidney disease, stage 2 (mild): Secondary | ICD-10-CM

## 2020-12-30 DIAGNOSIS — I63412 Cerebral infarction due to embolism of left middle cerebral artery: Secondary | ICD-10-CM

## 2020-12-30 LAB — HIV ANTIBODY (ROUTINE TESTING W REFLEX): HIV Screen 4th Generation wRfx: NONREACTIVE

## 2020-12-30 LAB — ECHOCARDIOGRAM COMPLETE BUBBLE STUDY
AR max vel: 3.15 cm2
AV Area VTI: 4.19 cm2
AV Area mean vel: 4.08 cm2
AV Mean grad: 1 mmHg
AV Peak grad: 2.9 mmHg
Ao pk vel: 0.85 m/s
Area-P 1/2: 4.15 cm2
S' Lateral: 2.26 cm

## 2020-12-30 LAB — SARS CORONAVIRUS 2 (TAT 6-24 HRS): SARS Coronavirus 2: NEGATIVE

## 2020-12-30 MED ORDER — ATORVASTATIN CALCIUM 80 MG PO TABS
80.0000 mg | ORAL_TABLET | Freq: Every day | ORAL | Status: DC
  Filled 2020-12-30: qty 1

## 2020-12-30 MED ORDER — LEVETIRACETAM 500 MG PO TABS: 500.0000 mg | ORAL_TABLET | Freq: Two times a day (BID) | ORAL | 0 refills | Status: DC

## 2020-12-30 MED ORDER — LOSARTAN POTASSIUM 100 MG PO TABS: 100.0000 mg | ORAL_TABLET | Freq: Every day | ORAL | 0 refills | Status: DC

## 2020-12-30 MED ORDER — ATENOLOL 100 MG PO TABS: 100.0000 mg | ORAL_TABLET | Freq: Every day | ORAL | 0 refills | Status: DC

## 2020-12-30 MED ORDER — AMLODIPINE BESYLATE 10 MG PO TABS: 10.0000 mg | ORAL_TABLET | Freq: Every day | ORAL | 0 refills | Status: DC

## 2020-12-30 MED ORDER — HYDROCHLOROTHIAZIDE 25 MG PO TABS: 25.0000 mg | ORAL_TABLET | Freq: Every day | ORAL | 0 refills | Status: DC

## 2020-12-30 MED ORDER — CLOPIDOGREL BISULFATE 75 MG PO TABS: 75.0000 mg | ORAL_TABLET | Freq: Every day | ORAL | 0 refills | Status: DC

## 2020-12-30 MED ORDER — ATORVASTATIN CALCIUM 80 MG PO TABS: 80.0000 mg | ORAL_TABLET | Freq: Every day | ORAL | 0 refills | Status: DC

## 2020-12-30 NOTE — Progress Notes (Signed)
SLP Cancellation Note  Patient Details Name: Shalon Councilman MRN: [id number] DOB: [birth year]   Cancelled treatment:       Reason Eval/Treat Not Completed: SLP screened, no needs identified, will sign off (chart reviewed; consulted NSG then met w/ pt). Pt denied any difficulty swallowing and is currently on a regular diet; tolerates swallowing pills w/ water per NSG. Pt conversed at conversational level w/out deficits noted; pt and NSG denied any speech-language deficits. Pt up and walking around room and given a menu to order Lunch.  No further skilled ST services indicated as pt appears at his baseline. Pt agreed. NSG to reconsult if any change in status.      Orinda Kenner, MS, CCC-SLP Speech Language Pathologist Rehab [facility] [phone] [facility] 12/30/2020, 10:49 AM

## 2020-12-30 NOTE — Progress Notes (Signed)
Subjective: Feels back to normal  Exam: Vitals:   12/30/20 0832 12/30/20 1154  BP: (!) 170/108 (!) 169/103  Pulse: 82 84  Resp:    Temp:  98.3 F (36.8 C)  SpO2:     Gen: In bed, NAD Resp: non-labored breathing, no acute distress Abd: soft, nt  Neuro: MS: Awake, alert, interactive and appropriate CN: Pupils equal round and reactive to light, extraocular movements intact Motor: 5/5 throughout Sensory: Intact light touch  Pertinent Labs: Creatinine 1.3  Impression: 50 year old male with acute embolic appearing strokes.  I suspect artery to artery embolus from his left MCA stenosis.  Less likely would be hypotensive event with ischemia in that area due to being in the posterior watershed region.  The bilateral tingling that he describes as part of his event is unusual, and I am not sure if that would definitely be ascribed to cerebral ischemia, but may be anxiety coupled with his very real ischemic symptoms.  In any case, current management would be dual antiplatelet therapy, high intensity statin, smoking cessation.  Also, I feel that he is at high risk of recurrent seizures in the future and therefore I agree with continuing his antiepileptic.  Recommendations: 1) follow-up echo, telemetry 2) aspirin 81 mg daily plus Plavix 75 mg daily x90 days followed by monotherapy 3) high intensity statin such as atorvastatin 80 mg nightly 4) Keppra 500 mg twice daily 5) follow-up with outpatient neurology  Ritta Slot, MD [facility] [phone]  If 7pm- 7am, please page neurology on call as listed in AMION.

## 2020-12-30 NOTE — Discharge Summary (Signed)
Physician Discharge Summary  Gary [name] EYC:[id number] DOB: Jul 13, 1971 DOA: 12/29/2020  PCP: Patient, No Pcp Per  Admit date: 12/29/2020 Discharge date: 12/30/2020  Admitted From: home Disposition: home  Recommendations for Outpatient Follow-up:  1. Follow up with PCP in 1-2 weeks 2. F/u w/ neuro, Dr. Malvin Johns, in 1 week   Home Health: no  Equipment/Devices:   Discharge Condition: stable  CODE STATUS: full  Diet recommendation: Heart Healthy  Brief/Interim Summary: HPI was taken from Dr. Clyde Lundborg: Gary [name] is a 50 y.o. male with medical history significant of hypertension, hyperlipidemia, stroke, GERD, seizure (self stopped Keppra 3 years ago, did not have seizure in past 3 years), alcohol abuse, tobacco abuse, CKD-2, who presents with weakness, left facial droop and slurred speech.  Patient states that she has weakness in all extremities intermittently for about 1 week.  He also has numbness in all extremities during the same period of time.  Today he developed left facial droop and slurred speech which has resolved at arrival to ED.  Patient was reportedly to have had confusion which has resolved.  Currently patient is alert, oriented x3.  No difficult swallowing or vision loss.  Patient has mild cough, no shortness breath, chest pain, fever or chills.  Denies nausea, vomiting, diarrhea, abdominal pain, symptoms of UTI.  ED Course: pt was found to have WBC 7.8, troponin level 22, 27, alcohol level less than 10, pending COVID-19 PCR, worsening renal function, temperature 99, blood pressure 206/128, heart rate 101, 99, RR 25, oxygen saturation 99% on room air.  Patient is placed on MedSurg bed for observation. Dr. Iver Nestle of neuro is consulted.  MRI of brain showed: Approximately 5 punctate areas of acute infarct in the left frontal parietal cortex compatible small emboli. Chronic infarct right occipital lobe.   CT head: 1. No evidence of acute intracranial abnormality. 2.  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 3. Paranasal sinus disease. Most notably, there is near complete opacification of the left frontal sinus and anterior left ethmoid air cells, as well as complete opacification of the imaged left maxillary sinus (left ostiomeatal unit obstructive pattern). Right sphenoid sinusitis is also notable.  CT cervical spine: 1. No evidence of acute fracture to the cervical spine. 2. Nonspecific reversal of the expected cervical lordosis. 3. Mild C2-C3 and C3-C4 grade 1 anterolisthesis. 4. Cervical spondylosis as described. 5. Right-sided facet joint ankylosis at C2-C3.  CTA neck: 1. The common and internal carotid arteries are patent within the neck. Soft and calcified plaque within the mid to distal cervical right ICA results in less than 50% ICA stenosis. 2. Vertebral arteries patent within the neck without stenosis.  CTA head: 1. No intracranial large vessel occlusion. 2. Intracranial atherosclerotic disease with multifocal stenoses, most notably as follows. 3. Moderate stenosis of the M1 left middle cerebral artery. 4. Apparent moderate stenosis within the cavernous right ICA. 5. Apparent mild/moderate stenosis within the cavernous left ICA   Hospital Course from Dr. Mayford Knife 12/30/20: Pt was found to have CVA on admission. MRI of brain showed punctate areas of acute infarct in left frontal parietal cortex. Echo showed EF is 55-60%, grade I diastolic dysfunction & no evidence of any interatrial shunt. Pt was started on high intensity statin, aspirin & plavix. Pt will continue on aspirin and plavix x 90 days as per neuro. No hx of a. fib or a. flutter. Of note, pt has hx of cigarette smoking and alcohol abuse. Pt received cessation counseling of both of these. For  information, please see other progress/consult notes  Discharge Diagnoses:  Principal Problem:   Stroke [facility]) Active Problems:    Hypertension   Hyperlipidemia   Seizures (HCC)   Elevated troponin   Alcohol abuse   Tobacco abuse   Acute renal failure superimposed on stage 2 chronic kidney disease (HCC)    CVA: MRI of brain showed an approximately 5 punctate areas of acute infarct in the left frontal parietal cortex compatible small emboli. CTA head shows no large vessel occulusion. Continue on statin and started on plavix, aspirin. Echo ordered.   HTN: allow permissive HTN. Continue to hold atenolol, HCTZ & losartan & restart at d/c  HLD: continue on statin   Seizure: self stopped Keppra 3 years ago, did not have seizure in the past 3 years. Ativan prn   Elevated troponin: likely secondary to demand ischemia.   Tobacco abuse: nicotine patch to prevent w/drawal. Smoking cessation counseling   Alcohol abuse: continue on CIWA protocol. Alcohol cessation counseling   AKI on CKDII: baseline Cr 1.27. Cr is trending down from day prior   Obesity: BMI 31.8. Complicates overall care and prognosis   Discharge Instructions  Discharge Instructions    Diet - low sodium heart healthy   Complete by: As directed    Discharge instructions   Complete by: As directed    F/u w/ neuro, Dr. Malvin Johns, in 1 week. F/u w/ PCP in 1-2 weeks   Increase activity slowly   Complete by: As directed      Allergies as of 12/30/2020   No Known Allergies     Medication List    TAKE these medications   amLODipine 10 MG tablet Commonly known as: NORVASC Take 1 tablet (10 mg total) by mouth daily.   aspirin 81 MG tablet Take 1 tablet (81 mg total) by mouth daily.   atenolol 100 MG tablet Commonly known as: TENORMIN Take 1 tablet (100 mg total) by mouth daily.   atorvastatin 80 MG tablet Commonly known as: Lipitor Take 1 tablet (80 mg total) by mouth at bedtime.   clopidogrel 75 MG tablet Commonly known as: PLAVIX Take 1 tablet (75 mg total) by mouth daily. Start taking on: December 31, 2020   hydrochlorothiazide 25 MG  tablet Commonly known as: HYDRODIURIL Take 1 tablet (25 mg total) by mouth daily.   levETIRAcetam 1000 MG tablet Commonly known as: KEPPRA Take 1 tablet (1,000 mg total) by mouth 2 (two) times daily.   losartan 100 MG tablet Commonly known as: COZAAR Take 100 mg by mouth daily.   omeprazole 20 MG capsule Commonly known as: PRILOSEC TAKE ONE CAPSULE BY MOUTH EVERY DAY       No Known Allergies  Consultations:  Neuro, Dr. Amada Jupiter    Procedures/Studies: CT ANGIO HEAD W OR WO CONTRAST  Result Date: 12/29/2020 CLINICAL DATA:  Transient ischemic attack (TIA). Additional history provided: Patient reports fall on Tuesday due to weakness in legs, history of 3 prior strokes, history of seizure. EXAM: CT ANGIOGRAPHY HEAD AND NECK TECHNIQUE: Multidetector CT imaging of the head and neck was performed using the standard protocol during bolus administration of intravenous contrast. Multiplanar CT image reconstructions and MIPs were obtained to evaluate the vascular anatomy. Carotid stenosis measurements (when applicable) are obtained utilizing NASCET criteria, using the distal internal carotid diameter as the denominator. CONTRAST:  81mL OMNIPAQUE IOHEXOL 350 MG/ML SOLN COMPARISON:  Head CT 12/29/2020. FINDINGS: CTA NECK FINDINGS Aortic arch: Common origin of the innominate and left common carotid  arteries. The visualized aortic arch is normal in caliber. No hemodynamically significant innominate or proximal subclavian artery stenosis. Right carotid system: CCA and ICA patent within the neck. Soft calcified plaque within the mid to distal CCA results in less than 50% ICA stenosis. Left carotid system: CCA and ICA patent within the neck without stenosis. No significant atherosclerotic disease. Vertebral arteries: Patent within the neck without stenosis. Left vertebral artery dominant. Skeleton: Redemonstrated trace C2-C3 and C3-C4 grade 1 anterolisthesis. Nonspecific reversal of the expected  cervical lordosis. Cervical spondylosis. Facet joint ankylosis on the right at C2-C3. No acute bony abnormality or aggressive osseous lesion. Other neck: No neck mass or cervical lymphadenopathy. Thyroid unremarkable. Upper chest: No consolidation within the imaged lung apices. Review of the MIP images confirms the above findings CTA HEAD FINDINGS Anterior circulation: The intracranial internal carotid arteries are patent. Atherosclerotic plaque within both vessels. Apparent moderate stenosis within the cavernous right ICA. Apparent mild/moderate stenosis within the cavernous left ICA. The M1 middle cerebral arteries are patent. Moderate stenosis of the M1 left middle cerebral artery. Atherosclerotic irregularity of the M2 and more distal MCA branch vessels bilaterally. No M2 proximal branch occlusion or high-grade proximal stenosis is identified. The A1 right anterior cerebral artery is hypoplastic or absent. The anterior cerebral arteries are otherwise patent without significant stenosis. No intracranial aneurysm is identified. Posterior circulation: The intracranial left vertebral artery is developmentally diminutive, but patent. The dominant intracranial left vertebral artery is patent without stenosis. The basilar artery is patent. The posterior cerebral arteries are patent. Predominantly fetal origin right posterior cerebral artery. A left posterior communicating artery is also present. Venous sinuses: Within the limitations of contrast timing, no convincing thrombus. Anatomic variants: As described Review of the MIP images confirms the above findings IMPRESSION: CTA neck: 1. The common and internal carotid arteries are patent within the neck. Soft and calcified plaque within the mid to distal cervical right ICA results in less than 50% ICA stenosis. 2. Vertebral arteries patent within the neck without stenosis. CTA head: 1. No intracranial large vessel occlusion. 2. Intracranial atherosclerotic disease with  multifocal stenoses, most notably as follows. 3. Moderate stenosis of the M1 left middle cerebral artery. 4. Apparent moderate stenosis within the cavernous right ICA. 5. Apparent mild/moderate stenosis within the cavernous left ICA. Electronically Signed   By: Jackey Loge DO   On: 12/29/2020 15:14   CT Head Wo Contrast  Result Date: 12/29/2020 CLINICAL DATA:  Head trauma, abnormal mental status. Neck trauma, dangerous injury mechanism. Additional history provided: Bilateral leg weakness starting last week with multiple falls, history of strokes in 2017, past history of seizures. EXAM: CT HEAD WITHOUT CONTRAST CT CERVICAL SPINE WITHOUT CONTRAST TECHNIQUE: Multidetector CT imaging of the head and cervical spine was performed following the standard protocol without intravenous contrast. Multiplanar CT image reconstructions of the cervical spine were also generated. COMPARISON:  Head CT 06/29/2017. FINDINGS: CT HEAD FINDINGS Brain: Redemonstrated chronic cortical/subcortical right parietooccipital lobe infarct. Background cerebral volume is normal. There is no acute intracranial hemorrhage. No acute demarcated cortical infarct. No extra-axial fluid collection. No evidence of intracranial mass. No midline shift. Partially empty sella turcica. Vascular: No hyperdense vessel.  Atherosclerotic calcifications. Skull: Normal. Negative for fracture or focal lesion. Sinuses/Orbits: Visualized orbits show no acute finding. Near complete opacification of the left frontal sinus and left anterior ethmoid air cells. Complete opacification of the imaged left maxillary sinus. Frothy secretions and tiny mucous retention cyst within the right sphenoid sinus. CT CERVICAL  SPINE FINDINGS Alignment: Reversal of the expected cervical lordosis. Trace C2-C3 and C3-C4 grade 1 anterolisthesis. Skull base and vertebrae: The basion-dental and atlanto-dental intervals are maintained.No evidence of acute fracture to the cervical spine. Soft  tissues and spinal canal: No prevertebral fluid or swelling. No visible canal hematoma. Disc levels: Cervical spondylosis. Levels of mild disc space narrowing greatest at C4-C5 and C5-C6. Shallow multilevel disc bulges. Minimal uncovertebral hypertrophy at C4-C5 and C5-C6. No appreciable significant spinal canal stenosis. No significant bony neural foraminal narrowing. Ventral osteophytes at C4-C5 and C5-C6. Facet joint ankylosis on the right at C2-C3. Upper chest: No consolidation within the imaged lung apices. No visible pneumothorax. IMPRESSION: CT head: 1. No evidence of acute intracranial abnormality. 2. 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 3. Paranasal sinus disease. Most notably, there is near complete opacification of the left frontal sinus and anterior left ethmoid air cells, as well as complete opacification of the imaged left maxillary sinus (left ostiomeatal unit obstructive pattern). Right sphenoid sinusitis is also notable. CT cervical spine: 1. No evidence of acute fracture to the cervical spine. 2. Nonspecific reversal of the expected cervical lordosis. 3. Mild C2-C3 and C3-C4 grade 1 anterolisthesis. 4. Cervical spondylosis as described. 5. Right-sided facet joint ankylosis at C2-C3. Electronically Signed   By: Jackey Loge DO   On: 12/29/2020 12:29   CT ANGIO NECK W OR WO CONTRAST  Result Date: 12/29/2020 CLINICAL DATA:  Transient ischemic attack (TIA). Additional history provided: Patient reports fall on Tuesday due to weakness in legs, history of 3 prior strokes, history of seizure. EXAM: CT ANGIOGRAPHY HEAD AND NECK TECHNIQUE: Multidetector CT imaging of the head and neck was performed using the standard protocol during bolus administration of intravenous contrast. Multiplanar CT image reconstructions and MIPs were obtained to evaluate the vascular anatomy. Carotid stenosis measurements (when applicable) are obtained utilizing NASCET criteria,  using the distal internal carotid diameter as the denominator. CONTRAST:  75mL OMNIPAQUE IOHEXOL 350 MG/ML SOLN COMPARISON:  Head CT 12/29/2020. FINDINGS: CTA NECK FINDINGS Aortic arch: Common origin of the innominate and left common carotid arteries. The visualized aortic arch is normal in caliber. No hemodynamically significant innominate or proximal subclavian artery stenosis. Right carotid system: CCA and ICA patent within the neck. Soft calcified plaque within the mid to distal CCA results in less than 50% ICA stenosis. Left carotid system: CCA and ICA patent within the neck without stenosis. No significant atherosclerotic disease. Vertebral arteries: Patent within the neck without stenosis. Left vertebral artery dominant. Skeleton: Redemonstrated trace C2-C3 and C3-C4 grade 1 anterolisthesis. Nonspecific reversal of the expected cervical lordosis. Cervical spondylosis. Facet joint ankylosis on the right at C2-C3. No acute bony abnormality or aggressive osseous lesion. Other neck: No neck mass or cervical lymphadenopathy. Thyroid unremarkable. Upper chest: No consolidation within the imaged lung apices. Review of the MIP images confirms the above findings CTA HEAD FINDINGS Anterior circulation: The intracranial internal carotid arteries are patent. Atherosclerotic plaque within both vessels. Apparent moderate stenosis within the cavernous right ICA. Apparent mild/moderate stenosis within the cavernous left ICA. The M1 middle cerebral arteries are patent. Moderate stenosis of the M1 left middle cerebral artery. Atherosclerotic irregularity of the M2 and more distal MCA branch vessels bilaterally. No M2 proximal branch occlusion or high-grade proximal stenosis is identified. The A1 right anterior cerebral artery is hypoplastic or absent. The anterior cerebral arteries are otherwise patent without significant stenosis. No intracranial aneurysm is identified. Posterior circulation: The intracranial left vertebral  artery is  developmentally diminutive, but patent. The dominant intracranial left vertebral artery is patent without stenosis. The basilar artery is patent. The posterior cerebral arteries are patent. Predominantly fetal origin right posterior cerebral artery. A left posterior communicating artery is also present. Venous sinuses: Within the limitations of contrast timing, no convincing thrombus. Anatomic variants: As described Review of the MIP images confirms the above findings IMPRESSION: CTA neck: 1. The common and internal carotid arteries are patent within the neck. Soft and calcified plaque within the mid to distal cervical right ICA results in less than 50% ICA stenosis. 2. Vertebral arteries patent within the neck without stenosis. CTA head: 1. No intracranial large vessel occlusion. 2. Intracranial atherosclerotic disease with multifocal stenoses, most notably as follows. 3. Moderate stenosis of the M1 left middle cerebral artery. 4. Apparent moderate stenosis within the cavernous right ICA. 5. Apparent mild/moderate stenosis within the cavernous left ICA. Electronically Signed   By: Jackey LogeKyle  Golden DO   On: 12/29/2020 15:14   CT Cervical Spine Wo Contrast  Result Date: 12/29/2020 CLINICAL DATA:  Head trauma, abnormal mental status. Neck trauma, dangerous injury mechanism. Additional history provided: Bilateral leg weakness starting last week with multiple falls, history of strokes in 2017, past history of seizures. EXAM: CT HEAD WITHOUT CONTRAST CT CERVICAL SPINE WITHOUT CONTRAST TECHNIQUE: Multidetector CT imaging of the head and cervical spine was performed following the standard protocol without intravenous contrast. Multiplanar CT image reconstructions of the cervical spine were also generated. COMPARISON:  Head CT 06/29/2017. FINDINGS: CT HEAD FINDINGS Brain: Redemonstrated chronic cortical/subcortical right parietooccipital lobe infarct. Background cerebral volume is normal. There is no acute  intracranial hemorrhage. No acute demarcated cortical infarct. No extra-axial fluid collection. No evidence of intracranial mass. No midline shift. Partially empty sella turcica. Vascular: No hyperdense vessel.  Atherosclerotic calcifications. Skull: Normal. Negative for fracture or focal lesion. Sinuses/Orbits: Visualized orbits show no acute finding. Near complete opacification of the left frontal sinus and left anterior ethmoid air cells. Complete opacification of the imaged left maxillary sinus. Frothy secretions and tiny mucous retention cyst within the right sphenoid sinus. CT CERVICAL SPINE FINDINGS Alignment: Reversal of the expected cervical lordosis. Trace C2-C3 and C3-C4 grade 1 anterolisthesis. Skull base and vertebrae: The basion-dental and atlanto-dental intervals are maintained.No evidence of acute fracture to the cervical spine. Soft tissues and spinal canal: No prevertebral fluid or swelling. No visible canal hematoma. Disc levels: Cervical spondylosis. Levels of mild disc space narrowing greatest at C4-C5 and C5-C6. Shallow multilevel disc bulges. Minimal uncovertebral hypertrophy at C4-C5 and C5-C6. No appreciable significant spinal canal stenosis. No significant bony neural foraminal narrowing. Ventral osteophytes at C4-C5 and C5-C6. Facet joint ankylosis on the right at C2-C3. Upper chest: No consolidation within the imaged lung apices. No visible pneumothorax. IMPRESSION: CT head: 1. No evidence of acute intracranial abnormality. 2. 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 3. Paranasal sinus disease. Most notably, there is near complete opacification of the left frontal sinus and anterior left ethmoid air cells, as well as complete opacification of the imaged left maxillary sinus (left ostiomeatal unit obstructive pattern). Right sphenoid sinusitis is also notable. CT cervical spine: 1. No evidence of acute fracture to the cervical spine. 2. Nonspecific  reversal of the expected cervical lordosis. 3. Mild C2-C3 and C3-C4 grade 1 anterolisthesis. 4. Cervical spondylosis as described. 5. Right-sided facet joint ankylosis at C2-C3. Electronically Signed   By: Jackey LogeKyle  Golden DO   On: 12/29/2020 12:29   MR BRAIN WO CONTRAST  Result  Date: 12/29/2020 CLINICAL DATA:  Acute neuro deficit.  Fall, weakness. EXAM: MRI HEAD WITHOUT CONTRAST TECHNIQUE: Multiplanar, multiecho pulse sequences of the brain and surrounding structures were obtained without intravenous contrast. COMPARISON:  CT head 12/29/2020 FINDINGS: Brain: Multiple small areas of acute infarct in the left frontal and parietal cortex compatible with small embolic infarcts. Chronic infarct right occipital lobe. Few small white matter hyperintensities appear chronic. Negative for hemorrhage or mass. Ventricle size normal. Vascular: Normal arterial flow voids Skull and upper cervical spine: Negative Sinuses/Orbits: Complete opacification left maxillary sinus and left ethmoid and frontal sinuses. Negative orbit Other: None IMPRESSION: Approximately 5 punctate areas of acute infarct in the left frontal parietal cortex compatible small emboli. Chronic infarct right occipital lobe. Electronically Signed   By: Marlan Palau M.D.   On: 12/29/2020 15:07   ECHOCARDIOGRAM COMPLETE BUBBLE STUDY  Result Date: 12/30/2020    ECHOCARDIOGRAM REPORT   Patient Name:   PAULETTE LYNCH Date of Exam: 12/30/2020 Medical Rec #:  [id number]      Height:       71.0 in Accession #:    [id number]     Weight:       228.0 lb Date of Birth:  Jan 08, 1971     BSA:          2.229 m Patient Age:    49 years       BP:           170/108 mmHg Patient Gender: M              HR:           82 bpm. Exam Location:  [facility] Procedure: 2D Echo, Cardiac Doppler, Color Doppler, Strain Analysis and Saline            Contrast Bubble Study Indications:     TIA 435.9 / G45.9  History:         Patient has no prior history of Echocardiogram examinations.                   Stroke; Risk Factors:Hypertension.  Sonographer:     Cristela Blue RDCS (AE) Referring Phys:  [id number] Lorretta Harp Diagnosing Phys: Julien Nordmann MD  Sonographer Comments: Global longitudinal strain was attempted. IMPRESSIONS  1. Left ventricular ejection fraction, by estimation, is 55 to 60%. The left ventricle has normal function. The left ventricle has no regional wall motion abnormalities. There is moderate concentric left ventricular hypertrophy. Left ventricular diastolic parameters are consistent with Grade I diastolic dysfunction (impaired relaxation). The average left ventricular global longitudinal strain is -12.3 %. The global longitudinal strain is abnormal.  2. Right ventricular systolic function is normal. The right ventricular size is normal.  3. Agitated saline contrast bubble study was negative, with no evidence of any interatrial shunt. FINDINGS  Left Ventricle: Left ventricular ejection fraction, by estimation, is 55 to 60%. The left ventricle has normal function. The left ventricle has no regional wall motion abnormalities. The average left ventricular global longitudinal strain is -12.3 %. The global longitudinal strain is abnormal. The left ventricular internal cavity size was normal in size. There is moderate concentric left ventricular hypertrophy. Left ventricular diastolic parameters are consistent with Grade I diastolic dysfunction (impaired relaxation). Right Ventricle: The right ventricular size is normal. No increase in right ventricular wall thickness. Right ventricular systolic function is normal. Left Atrium: Left atrial size was normal in size. Right Atrium: Right atrial size was normal in size. Pericardium: There is no evidence  of pericardial effusion. Mitral Valve: The mitral valve is normal in structure. No evidence of mitral valve regurgitation. No evidence of mitral valve stenosis. Tricuspid Valve: The tricuspid valve is normal in structure. Tricuspid valve regurgitation is not  demonstrated. No evidence of tricuspid stenosis. Aortic Valve: The aortic valve is normal in structure. Aortic valve regurgitation is not visualized. No aortic stenosis is present. Aortic valve mean gradient measures 1.0 mmHg. Aortic valve peak gradient measures 2.9 mmHg. Aortic valve area, by VTI measures 4.19 cm. Pulmonic Valve: The pulmonic valve was normal in structure. Pulmonic valve regurgitation is not visualized. No evidence of pulmonic stenosis. Aorta: The aortic root is normal in size and structure. Venous: The inferior vena cava is normal in size with greater than 50% respiratory variability, suggesting right atrial pressure of 3 mmHg. IAS/Shunts: No atrial level shunt detected by color flow Doppler. Agitated saline contrast was given intravenously to evaluate for intracardiac shunting. Agitated saline contrast bubble study was negative, with no evidence of any interatrial shunt.  LEFT VENTRICLE PLAX 2D LVIDd:         4.12 cm  Diastology LVIDs:         2.26 cm  LV e' medial:    6.42 cm/s LV PW:         2.10 cm  LV E/e' medial:  7.1 LV IVS:        1.55 cm  LV e' lateral:   4.46 cm/s LVOT diam:     2.10 cm  LV E/e' lateral: 10.2 LV SV:         48 LV SV Index:   21       2D Longitudinal Strain LVOT Area:     3.46 cm 2D Strain GLS Avg:     -12.3 %  RIGHT VENTRICLE RV Basal diam:  2.82 cm RV S prime:     16.10 cm/s TAPSE (M-mode): 3.9 cm LEFT ATRIUM             Index       RIGHT ATRIUM           Index LA diam:        3.90 cm 1.75 cm/m  RA Area:     17.30 cm LA Vol (A2C):   59.4 ml 26.65 ml/m RA Volume:   48.80 ml  21.89 ml/m LA Vol (A4C):   59.5 ml 26.69 ml/m LA Biplane Vol: 62.2 ml 27.90 ml/m  AORTIC VALVE                   PULMONIC VALVE AV Area (Vmax):    3.15 cm    PV Vmax:        0.72 m/s AV Area (Vmean):   4.08 cm    PV Peak grad:   2.1 mmHg AV Area (VTI):     4.19 cm    RVOT Peak grad: 3 mmHg AV Vmax:           84.60 cm/s AV Vmean:          48.200 cm/s AV VTI:            0.114 m AV Peak Grad:       2.9 mmHg AV Mean Grad:      1.0 mmHg LVOT Vmax:         77.00 cm/s LVOT Vmean:        56.800 cm/s LVOT VTI:          0.138 m LVOT/AV VTI ratio: 1.21  AORTA Ao Root diam: 3.50 cm MITRAL VALVE               TRICUSPID VALVE MV Area (PHT): 4.15 cm    TR Peak grad:   10.1 mmHg MV Decel Time: 183 msec    TR Vmax:        159.00 cm/s MV E velocity: 45.40 cm/s MV A velocity: 70.30 cm/s  SHUNTS MV E/A ratio:  0.65        Systemic VTI:  0.14 m                            Systemic Diam: 2.10 cm Julien Nordmann MD Electronically signed by Julien Nordmann MD Signature Date/Time: 12/30/2020/12:55:19 PM    Final        Subjective: Pt denies any difficulty talking, swallowing or walking.    Discharge Exam: Vitals:   12/30/20 0832 12/30/20 1154  BP: (!) 170/108 (!) 169/103  Pulse: 82 84  Resp:    Temp:  98.3 F (36.8 C)  SpO2:     Vitals:   12/29/20 2319 12/30/20 0409 12/30/20 0832 12/30/20 1154  BP: (!) 166/99 (!) 180/95 (!) 170/108 (!) 169/103  Pulse: 90 83 82 84  Resp: 16 16    Temp: 98.8 F (37.1 C) 98.7 F (37.1 C)  98.3 F (36.8 C)  TempSrc: Oral Oral    SpO2: 98% 100%    Weight:      Height:        General: Pt is alert, awake, not in acute distress Cardiovascular: S1/S2 +, no rubs, no gallops Respiratory: CTA bilaterally, no wheezing, no rhonchi Abdominal: Soft, NT, obese, bowel sounds + Extremities: no edema, no cyanosis    The results of significant diagnostics from this hospitalization (including imaging, microbiology, ancillary and laboratory) are listed below for reference.     Microbiology: Recent Results (from the past 240 hour(s))  SARS CORONAVIRUS 2 (TAT 6-24 HRS) Nasopharyngeal Nasopharyngeal Swab     Status: None   Collection Time: 12/29/20  3:24 PM   Specimen: Nasopharyngeal Swab  Result Value Ref Range Status   SARS Coronavirus 2 NEGATIVE NEGATIVE Final    Comment: (NOTE) SARS-CoV-2 target nucleic acids are NOT DETECTED.  The SARS-CoV-2 RNA is generally  detectable in upper and lower respiratory specimens during the acute phase of infection. Negative results do not preclude SARS-CoV-2 infection, do not rule out co-infections with other pathogens, and should not be used as the sole basis for treatment or other patient management decisions. Negative results must be combined with clinical observations, patient history, and epidemiological information. The expected result is Negative.  Fact Sheet for Patients: HairSlick.no  Fact Sheet for Healthcare Providers: quierodirigir.com  This test is not yet approved or cleared by the [place] FDA and  has been authorized for detection and/or diagnosis of SARS-CoV-2 by FDA under an Emergency Use Authorization (EUA). This EUA will remain  in effect (meaning this test can be used) for the duration of the COVID-19 declaration under Se ction 564(b)(1) of the Act, 21 U.S.C. section 360bbb-3(b)(1), unless the authorization is terminated or revoked sooner.  Performed at [facility], [address] [place], [place] [address]      Labs: BNP (last 3 results) No results for input(s): BNP in the last 8760 hours. Basic Metabolic Panel: Recent Labs  Lab 12/29/20 1143 12/29/20 2221  NA 137 135  K 4.2 4.2  CL 106 105  CO2 22 22  GLUCOSE 109* 104*  BUN 25* 25*  CREATININE 1.64* 1.32*  CALCIUM 9.4 9.0  MG 2.3  --    Liver Function Tests: Recent Labs  Lab 12/29/20 1143  AST 48*  ALT 58*  ALKPHOS 76  BILITOT 0.7  PROT 8.0  ALBUMIN 4.3   No results for input(s): LIPASE, AMYLASE in the last 168 hours. No results for input(s): AMMONIA in the last 168 hours. CBC: Recent Labs  Lab 12/29/20 1143 12/29/20 2221  WBC 7.8 10.1  NEUTROABS 4.0  --   HGB 13.6 12.7*  HCT 40.4 37.6*  MCV 96.7 96.7  PLT 231 226   Cardiac Enzymes: No results for input(s): CKTOTAL, CKMB, CKMBINDEX, TROPONINI in the last 168 hours. BNP: Invalid  input(s): POCBNP CBG: No results for input(s): GLUCAP in the last 168 hours. D-Dimer No results for input(s): DDIMER in the last 72 hours. Hgb A1c Recent Labs    12/29/20 1143  HGBA1C 5.5   Lipid Profile Recent Labs    12/29/20 2221  CHOL 272*  HDL 43  LDLCALC 191*  TRIG 189*  CHOLHDL 6.3   Thyroid function studies Recent Labs    12/29/20 1143  TSH 0.474   Anemia work up Recent Labs    12/29/20 1144  VITAMINB12 447   Urinalysis    Component Value Date/Time   COLORURINE YELLOW (A) 06/29/2017 1045   APPEARANCEUR Turbid (A) 11/05/2018 0000   LABSPEC 1.019 06/29/2017 1045   PHURINE 5.0 06/29/2017 1045   GLUCOSEU Negative 11/05/2018 0000   HGBUR NEGATIVE 06/29/2017 1045   BILIRUBINUR Negative 11/05/2018 0000   KETONESUR NEGATIVE 06/29/2017 1045   PROTEINUR Negative 11/05/2018 0000   PROTEINUR NEGATIVE 06/29/2017 1045   NITRITE Negative 11/05/2018 0000   NITRITE NEGATIVE 06/29/2017 1045   LEUKOCYTESUR Negative 11/05/2018 0000   Sepsis Labs Invalid input(s): PROCALCITONIN,  WBC,  LACTICIDVEN Microbiology Recent Results (from the past 240 hour(s))  SARS CORONAVIRUS 2 (TAT 6-24 HRS) Nasopharyngeal Nasopharyngeal Swab     Status: None   Collection Time: 12/29/20  3:24 PM   Specimen: Nasopharyngeal Swab  Result Value Ref Range Status   SARS Coronavirus 2 NEGATIVE NEGATIVE Final    Comment: (NOTE) SARS-CoV-2 target nucleic acids are NOT DETECTED.  The SARS-CoV-2 RNA is generally detectable in upper and lower respiratory specimens during the acute phase of infection. Negative results do not preclude SARS-CoV-2 infection, do not rule out co-infections with other pathogens, and should not be used as the sole basis for treatment or other patient management decisions. Negative results must be combined with clinical observations, patient history, and epidemiological information. The expected result is Negative.  Fact Sheet for  Patients: HairSlick.no  Fact Sheet for Healthcare Providers: quierodirigir.com  This test is not yet approved or cleared by the [place] FDA and  has been authorized for detection and/or diagnosis of SARS-CoV-2 by FDA under an Emergency Use Authorization (EUA). This EUA will remain  in effect (meaning this test can be used) for the duration of the COVID-19 declaration under Se ction 564(b)(1) of the Act, 21 U.S.C. section 360bbb-3(b)(1), unless the authorization is terminated or revoked sooner.  Performed at [facility], [address], [place], [place] [address]      Time coordinating discharge: Over 30 minutes  SIGNED:   Charise Killian, MD  [facility] 12/30/2020, 1:27 PM Pager   If 7PM-7AM, please contact night-coverage

## 2020-12-30 NOTE — Progress Notes (Signed)
PT Cancellation Note  Patient Details Name: Gary [name] MRN: [id number] DOB: [birth year]   Cancelled Treatment:    Reason Eval/Treat Not Completed: OT screened, no needs identified, will sign off. Per conversation with OT, pt is independent with all mobility with no acute PT needs and all symptoms have resolved. Will sign off. Please re-consult if new needs arise.  Aleda Grana, PT, DPT 12/30/20, 10:19 AM   Sandi Mariscal 12/30/2020, 10:18 AM

## 2020-12-30 NOTE — Evaluation (Signed)
Occupational Therapy Evaluation Patient Details Name: Gary [name] MRN: [id number] DOB: September 25, 1971 Today's Date: 12/30/2020    History of Present Illness 50 y.o. male with medical history significant of hypertension, hyperlipidemia, stroke, GERD, seizure (self stopped Keppra 3 years ago, did not have seizure in past 3 years), alcohol abuse, tobacco abuse, CKD-2, who presents with weakness, left facial droop and slurred speech.   Clinical Impression   Upon entering the room, pt supine in bed with no c/o pain and agreeable to OT intervention. Pt reports living in an apartment independently PTA. He works full time at [employer]. Pt reports driving and performing all ADLs and IADL tasks independently. Pt demonstrates ability to ambulate 600' and perform self care tasks independently without use of AD or modifications this session. Pt's vitals WNLs this session. OT did educate pt further on secondary stroke risk, signs, and symptoms of stroke. Pt verbalized he had stopped taking all seizure medications and BP meds for several years. OT encouraged pt to take medications as prescribed and he verbalized understanding. OT to SIGN OFF as pt has no skilled therapeutic need at this time. Thank you for this referral.      Follow Up Recommendations  No OT follow up    Equipment Recommendations  None recommended by OT       Precautions / Restrictions Precautions Precautions: None Restrictions Weight Bearing Restrictions: No      Mobility Bed Mobility Overal bed mobility: Independent                  Transfers Overall transfer level: Independent Equipment used: None                  Balance Overall balance assessment: Independent                                         ADL either performed or assessed with clinical judgement   ADL Overall ADL's : At baseline;Independent                                             Vision Baseline  Vision/History: No visual deficits Patient Visual Report: No change from baseline              Pertinent Vitals/Pain Pain Assessment: No/denies pain     Hand Dominance Right   Extremity/Trunk Assessment Upper Extremity Assessment Upper Extremity Assessment: Overall WFL for tasks assessed   Lower Extremity Assessment Lower Extremity Assessment: Overall WFL for tasks assessed       Communication Communication Communication: No difficulties   Cognition Arousal/Alertness: Awake/alert Behavior During Therapy: WFL for tasks assessed/performed Overall Cognitive Status: Within Functional Limits for tasks assessed                                                Home Living Family/patient expects to be discharged to:: Private residence Living Arrangements: Alone   Type of Home: Apartment Home Access: Stairs to enter [employer] of Steps: level entry but on second floor- 15 steps to get to front door   Home Layout: One level     Bathroom Shower/Tub: Tub/shower unit  Home Equipment: None          Prior Functioning/Environment Level of Independence: Independent        Comments: works full time at [employer], mining goals can be found in the care plan section) Acute Rehab OT Goals Patient Stated Goal: to go home  OT Frequency:      AM-PAC OT "6 Clicks" Daily Activity     Outcome Measure Help from another person eating meals?: None Help from another person taking care of personal grooming?: None Help from another person toileting, which includes using toliet, bedpan, or urinal?: None Help from another person bathing (including washing, rinsing, drying)?: None Help from another person to put on and taking off regular upper body clothing?: None Help from another person to put on and taking off regular lower body clothing?: None 6 Click Score: 24   End of Session Nurse Communication: Mobility  status  Activity Tolerance: Patient tolerated treatment well Patient left: in bed;with call bell/phone within reach                   Time: 0932-0951 OT Time Calculation (min): 19 min Charges:  OT General Charges $OT Visit: 1 Visit OT Evaluation $OT Eval Low Complexity: 1 Low OT Treatments $Therapeutic Activity: 8-22 mins  Jackquline Denmark, MS, OTR/L , CBIS ascom [phone]  12/30/20, 10:32 AM

## 2020-12-30 NOTE — Progress Notes (Signed)
*  PRELIMINARY RESULTS* Echocardiogram 2D Echocardiogram has been performed.  Cristela Blue 12/30/2020, 9:45 AM

## 2021-01-09 LAB — VITAMIN B1: Vitamin B1 (Thiamine): 133.9 nmol/L (ref 66.5–200.0)

## 2021-01-11 LAB — VITAMIN B6: Vitamin B6: 17.1 ug/L (ref 5.3–46.7)

## 2021-01-14 ENCOUNTER — Emergency Department: Payer: Self-pay

## 2021-01-14 ENCOUNTER — Other Ambulatory Visit: Payer: Self-pay

## 2021-01-14 ENCOUNTER — Emergency Department
Admission: EM | Admit: 2021-01-14 | Discharge: 2021-01-14 | Disposition: A | Discharge status: Home / Self Care | Payer: Self-pay | Attending: Emergency Medicine | Admitting: Emergency Medicine

## 2021-01-14 DIAGNOSIS — Z7982 Long term (current) use of aspirin: Secondary | ICD-10-CM | POA: Insufficient documentation

## 2021-01-14 DIAGNOSIS — Z7902 Long term (current) use of antithrombotics/antiplatelets: Secondary | ICD-10-CM | POA: Insufficient documentation

## 2021-01-14 DIAGNOSIS — I6930 Unspecified sequelae of cerebral infarction: Secondary | ICD-10-CM | POA: Insufficient documentation

## 2021-01-14 DIAGNOSIS — F17200 Nicotine dependence, unspecified, uncomplicated: Secondary | ICD-10-CM | POA: Insufficient documentation

## 2021-01-14 DIAGNOSIS — M6281 Muscle weakness (generalized): Secondary | ICD-10-CM | POA: Insufficient documentation

## 2021-01-14 DIAGNOSIS — Z79899 Other long term (current) drug therapy: Secondary | ICD-10-CM | POA: Insufficient documentation

## 2021-01-14 DIAGNOSIS — N182 Chronic kidney disease, stage 2 (mild): Secondary | ICD-10-CM | POA: Insufficient documentation

## 2021-01-14 DIAGNOSIS — I129 Hypertensive chronic kidney disease with stage 1 through stage 4 chronic kidney disease, or unspecified chronic kidney disease: Secondary | ICD-10-CM | POA: Insufficient documentation

## 2021-01-14 LAB — CBC
HCT: 39.3 % (ref 39.0–52.0)
Hemoglobin: 13.2 g/dL (ref 13.0–17.0)
MCH: 32 pg (ref 26.0–34.0)
MCHC: 33.6 g/dL (ref 30.0–36.0)
MCV: 95.4 fL (ref 80.0–100.0)
Platelets: 276 10*3/uL (ref 150–400)
RBC: 4.12 MIL/uL — ABNORMAL LOW (ref 4.22–5.81)
RDW: 12 % (ref 11.5–15.5)
WBC: 7.9 10*3/uL (ref 4.0–10.5)
nRBC: 0 % (ref 0.0–0.2)

## 2021-01-14 LAB — COMPREHENSIVE METABOLIC PANEL
ALT: 65 U/L — ABNORMAL HIGH (ref 0–44)
AST: 60 U/L — ABNORMAL HIGH (ref 15–41)
Albumin: 4.3 g/dL (ref 3.5–5.0)
Alkaline Phosphatase: 104 U/L (ref 38–126)
Anion gap: 10 (ref 5–15)
BUN: 38 mg/dL — ABNORMAL HIGH (ref 6–20)
CO2: 28 mmol/L (ref 22–32)
Calcium: 10.1 mg/dL (ref 8.9–10.3)
Chloride: 98 mmol/L (ref 98–111)
Creatinine, Ser: 1.88 mg/dL — ABNORMAL HIGH (ref 0.61–1.24)
GFR, Estimated: 43 mL/min — ABNORMAL LOW (ref >60)
Glucose, Bld: 150 mg/dL — ABNORMAL HIGH (ref 70–99)
Potassium: 3.9 mmol/L (ref 3.5–5.1)
Sodium: 136 mmol/L (ref 135–145)
Total Bilirubin: 0.8 mg/dL (ref 0.3–1.2)
Total Protein: 8.9 g/dL — ABNORMAL HIGH (ref 6.5–8.1)

## 2021-01-14 MED ORDER — LORAZEPAM 2 MG/ML IJ SOLN
0.5000 mg | Freq: Once | INTRAMUSCULAR | Status: AC
  Administered 2021-01-14: 0.5 mg via INTRAVENOUS
  Filled 2021-01-14: qty 1

## 2021-01-14 NOTE — ED Notes (Signed)
Pt taken for MRI.

## 2021-01-14 NOTE — ED Notes (Signed)
Dr Kinner at bedside. 

## 2021-01-14 NOTE — ED Provider Notes (Signed)
[facility] Emergency Department Provider Note   ____________________________________________    I have reviewed the triage vital signs and the nursing notes.   HISTORY  Chief Complaint Dizziness, weakness    HPI Gary [name] is a 50 y.o. male with a history as noted below who was admitted to the hospital for CVA recently, discharged on 25 March.  He reports that he thinks that he may be continuing to have strokes.  He notes multiple episodes where he becomes acutely dizzy, his legs feel weak and he feels confused, he reports these typically resolve within 10 to 15 minutes.  Currently feels well, has not had any of the symptoms today.  Review of medical records demonstrates the patient with multiple small acute thalamic strokes likely related to carotid stenosis.  Reports compliance with his medications.  Past Medical History:  Diagnosis Date  . Alcohol use   . Hypercholesteremia   . Hypertension   . Seizures (HCC)   . Stroke (HCC)   . Tobacco abuse     Patient Active Problem List   Diagnosis Date Noted  . Elevated troponin 12/29/2020  . Stroke (HCC) 12/29/2020  . Acute renal failure superimposed on stage 2 chronic kidney disease (HCC) 12/29/2020  . Alcohol abuse   . Tobacco abuse   . Hypertension 11/07/2017  . Hyperlipidemia 11/07/2017  . Seizures (HCC) 11/07/2017  . History of stroke 11/07/2017  . Submandibular abscess 11/07/2017    No past surgical history on file.  Prior to Admission medications   Medication Sig Start Date End Date Taking? Authorizing Provider  amLODipine (NORVASC) 10 MG tablet TAKE ONE TABLET BY MOUTH EVERY DAY 12/30/20 12/30/21  Charise Killian, MD  aspirin 81 MG tablet Take 1 tablet (81 mg total) by mouth daily. Patient not taking: No sig reported 11/01/17   Kallie Locks, FNP  atenolol (TENORMIN) 100 MG tablet Take 1 tablet (100 mg total) by mouth daily. 12/30/20 01/29/21  Charise Killian, MD  atenolol  (TENORMIN) 50 MG tablet TAKE TWO TABLETS (100MG ) BY MOUTH DAILY 12/30/20 05/07/21  05/09/21, MD  atorvastatin (LIPITOR) 80 MG tablet TAKE ONE TABLET BY MOUTH AT BEDTIME 12/30/20 12/30/21  01/01/22, MD  clopidogrel (PLAVIX) 75 MG tablet TAKE ONE TABLET BY MOUTH EVERY DAY 12/30/20 12/30/21  01/01/22, MD  hydrochlorothiazide (HYDRODIURIL) 25 MG tablet TAKE ONE TABLET BY MOUTH EVERY DAY 12/30/20 12/30/21  01/01/22, MD  levETIRAcetam (KEPPRA) 250 MG tablet TAKE TWO TABLETS (500 MG) BY MOUTH 2 TIMES A DAY 12/30/20 12/30/21  01/01/22, MD  levETIRAcetam (KEPPRA) 500 MG tablet Take 1 tablet (500 mg total) by mouth 2 (two) times daily. 12/30/20 01/29/21  01/31/21, MD  losartan (COZAAR) 100 MG tablet TAKE ONE TABLET BY MOUTH EVERY DAY 12/30/20 12/30/21  01/01/22, MD  omeprazole (PRILOSEC) 20 MG capsule TAKE ONE CAPSULE BY MOUTH EVERY DAY Patient not taking: No sig reported 02/25/19   02/27/19, MD     Allergies Patient has no known allergies.  Family History  Problem Relation Age of Onset  . Hypertension Mother     Social History Social History   Tobacco Use  . Smoking status: Current Some Day Smoker    Packs/day: 0.33  . Smokeless tobacco: Never Used  Vaping Use  . Vaping Use: Never used  Substance Use Topics  . Alcohol use: Yes    Comment: occasionally  . Drug use:  No    Review of Systems  Constitutional: No fever/chills Eyes: No visual changes.  ENT: No sore throat. Cardiovascular: Denies chest pain. Respiratory: Denies shortness of breath. Gastrointestinal: No abdominal pain.  No nausea, no vomiting.   Genitourinary: Negative for dysuria. Musculoskeletal: Negative for back pain. Skin: Negative for rash. Neurological: Mild headache, as above   ____________________________________________   PHYSICAL EXAM:  VITAL SIGNS: ED Triage Vitals  Enc Vitals Group     BP 01/14/21 0951 136/86     Pulse Rate  01/14/21 0951 81     Resp 01/14/21 0951 19     Temp 01/14/21 0951 98.2 F (36.8 C)     Temp src --      SpO2 01/14/21 0951 98 %     Weight 01/14/21 0949 99.8 kg (220 lb)     Height 01/14/21 0949 1.803 m (5\' 11" )     Head Circumference --      Peak Flow --      Pain Score 01/14/21 0949 8     Pain Loc --      Pain Edu? --      Excl. in GC? --     Constitutional: Alert and oriented.  Eyes: Conjunctivae are normal.  PERRLA, EOMI Head: Atraumatic. Nose: No congestion/rhinnorhea. Mouth/Throat: Mucous membranes are moist.    Cardiovascular: Normal rate, regular rhythm. Grossly normal heart sounds.  Good peripheral circulation. Respiratory: Normal respiratory effort.  No retractions.  Gastrointestinal: Soft and nontender. No distention.  No CVA tenderness.  Musculoskeletal: No lower extremity tenderness nor edema.  Warm and well perfused Neurologic:  Normal speech and language. No gross focal neurologic deficits are appreciated.  Cranial nerves II through XII are normal here, normal strength in all extremity Skin:  Skin is warm, dry and intact. No rash noted. Psychiatric: Mood and affect are normal. Speech and behavior are normal.  ____________________________________________   LABS (all labs ordered are listed, but only abnormal results are displayed)  Labs Reviewed  CBC - Abnormal; Notable for the following components:      Result Value   RBC 4.12 (*)    All other components within normal limits  COMPREHENSIVE METABOLIC PANEL - Abnormal; Notable for the following components:   Glucose, Bld 150 (*)    BUN 38 (*)    Creatinine, Ser 1.88 (*)    Total Protein 8.9 (*)    AST 60 (*)    ALT 65 (*)    GFR, Estimated 43 (*)    All other components within normal limits   ____________________________________________  EKG  None ____________________________________________  RADIOLOGY  MRI brain without new  infarct ____________________________________________   PROCEDURES  Procedure(s) performed: No  Procedures   Critical Care performed: No ____________________________________________   INITIAL IMPRESSION / ASSESSMENT AND PLAN / ED COURSE  Pertinent labs & imaging results that were available during my care of the patient were reviewed by me and considered in my medical decision making (see chart for details).  Patient presents with intermittent episodes of dizziness, confusion, lower extremity weakness bilaterally/unsteadiness, the symptoms are similar he reports to when he was diagnosed with a CVA.  Does apparently have carotid stenosis, concern for continued CVA.  Will send for repeat MR brain  ----------------------------------------- 12:43 PM on 01/14/2021 -----------------------------------------  Repeat MRI is reassuring, no new infarcts, discussed with patient and he is reassured.  Likely is having post stroke sequela I and hopefully these will improve, he does have neurology outpatient follow-up scheduled, recommended continued compliance  with medications, close neurology follow-up.  Appropriate for discharge at this time    ____________________________________________   FINAL CLINICAL IMPRESSION(S) / ED DIAGNOSES  Final diagnoses:  Sequela, post-stroke        Note:  This document was prepared using Dragon voice recognition software and may include unintentional dictation errors.   Jene Every, MD 01/14/21 1243

## 2021-01-14 NOTE — ED Notes (Signed)
Pt back to room from MRI- resting with eyes closed and even respirations

## 2021-01-14 NOTE — ED Notes (Signed)
Lab called to run blood work

## 2021-01-14 NOTE — ED Notes (Signed)
Green, purple, and blue sent to lab

## 2021-01-14 NOTE — ED Notes (Signed)
Pt on phone with MRI

## 2021-01-14 NOTE — ED Triage Notes (Addendum)
Pt via POV from home. Pt states he was here last week and dx with a stroke. Pt states the symptoms are not getting any better. Pt states that 2 times yesterday. Pt states that he lost control of his legs and almost fell around 1:00pm and 2:00pm yesterday. Pt states that he has often been confused also yesterday. Pt is A&Ox4 and NAD.

## 2021-01-31 ENCOUNTER — Other Ambulatory Visit: Payer: Self-pay

## 2021-01-31 MED FILL — Atorvastatin Calcium Tab 80 MG (Base Equivalent): ORAL | 3 days supply | Qty: 3 | Fill #0 | Status: AC

## 2021-01-31 MED FILL — Hydrochlorothiazide Tab 25 MG: ORAL | 3 days supply | Qty: 3 | Fill #0 | Status: AC

## 2021-01-31 MED FILL — Losartan Potassium Tab 100 MG: ORAL | 3 days supply | Qty: 3 | Fill #0 | Status: AC

## 2021-01-31 MED FILL — Atenolol Tab 50 MG: ORAL | 3 days supply | Qty: 6 | Fill #0 | Status: AC

## 2021-01-31 MED FILL — Amlodipine Besylate Tab 10 MG (Base Equivalent): ORAL | 3 days supply | Qty: 3 | Fill #0 | Status: AC

## 2021-01-31 MED FILL — Clopidogrel Bisulfate Tab 75 MG (Base Equiv): ORAL | 3 days supply | Qty: 3 | Fill #0 | Status: AC

## 2021-01-31 MED FILL — Levetiracetam Tab 250 MG: ORAL | 3 days supply | Qty: 12 | Fill #0 | Status: AC

## 2021-02-02 ENCOUNTER — Ambulatory Visit: Payer: Self-pay | Admitting: Gerontology

## 2021-02-02 ENCOUNTER — Other Ambulatory Visit: Payer: Self-pay

## 2021-02-02 VITALS — BP 136/92 | HR 77 | Ht 70.0 in | Wt 214.8 lb

## 2021-02-02 DIAGNOSIS — E785 Hyperlipidemia, unspecified: Secondary | ICD-10-CM

## 2021-02-02 DIAGNOSIS — I639 Cerebral infarction, unspecified: Secondary | ICD-10-CM

## 2021-02-02 DIAGNOSIS — R569 Unspecified convulsions: Secondary | ICD-10-CM

## 2021-02-02 DIAGNOSIS — I6529 Occlusion and stenosis of unspecified carotid artery: Secondary | ICD-10-CM

## 2021-02-02 DIAGNOSIS — K219 Gastro-esophageal reflux disease without esophagitis: Secondary | ICD-10-CM

## 2021-02-02 DIAGNOSIS — I10 Essential (primary) hypertension: Secondary | ICD-10-CM

## 2021-02-02 DIAGNOSIS — Z7689 Persons encountering health services in other specified circumstances: Secondary | ICD-10-CM

## 2021-02-02 MED ORDER — LOSARTAN POTASSIUM 100 MG PO TABS
1.0000 | ORAL_TABLET | Freq: Every day | ORAL | 0 refills | Status: DC
  Filled 2021-02-02: qty 30, 30d supply, fill #0
  Filled 2021-03-06: qty 30, 30d supply, fill #1

## 2021-02-02 MED ORDER — ATENOLOL 50 MG PO TABS
100.0000 mg | ORAL_TABLET | Freq: Every day | ORAL | 0 refills | Status: DC
  Filled 2021-02-02: qty 60, 30d supply, fill #0
  Filled 2021-03-06: qty 60, 30d supply, fill #1
  Filled 2021-04-06: qty 60, 30d supply, fill #2

## 2021-02-02 MED ORDER — CLOPIDOGREL BISULFATE 75 MG PO TABS
1.0000 | ORAL_TABLET | Freq: Every day | ORAL | 0 refills | Status: DC
  Filled 2021-02-02: qty 30, 30d supply, fill #0
  Filled 2021-03-06: qty 30, 30d supply, fill #1
  Filled 2021-04-06: qty 30, 30d supply, fill #2

## 2021-02-02 MED ORDER — HYDROCHLOROTHIAZIDE 25 MG PO TABS
1.0000 | ORAL_TABLET | Freq: Every day | ORAL | 0 refills | Status: DC
  Filled 2021-02-02: qty 30, 30d supply, fill #0
  Filled 2021-03-06: qty 30, 30d supply, fill #1

## 2021-02-02 MED ORDER — LEVETIRACETAM 1000 MG PO TABS
1000.0000 mg | ORAL_TABLET | Freq: Two times a day (BID) | ORAL | 0 refills | Status: DC
  Filled 2021-02-02: qty 60, 30d supply, fill #0

## 2021-02-02 MED ORDER — OMEPRAZOLE 20 MG PO CPDR
1.0000 | DELAYED_RELEASE_CAPSULE | Freq: Every day | ORAL | 0 refills | Status: DC
  Filled 2021-02-02: qty 30, 30d supply, fill #0
  Filled 2021-03-06: qty 30, 30d supply, fill #1

## 2021-02-02 MED ORDER — ATORVASTATIN CALCIUM 80 MG PO TABS
1.0000 | ORAL_TABLET | Freq: Every day | ORAL | 0 refills | Status: DC
  Filled 2021-02-02: qty 30, 30d supply, fill #0

## 2021-02-02 MED ORDER — AMLODIPINE BESYLATE 10 MG PO TABS
1.0000 | ORAL_TABLET | Freq: Every day | ORAL | 0 refills | Status: DC
  Filled 2021-02-02: qty 30, 30d supply, fill #0
  Filled 2021-03-06: qty 30, 30d supply, fill #1
  Filled 2021-04-06: qty 30, 30d supply, fill #2

## 2021-02-02 NOTE — Patient Instructions (Signed)
Cooking With Less Salt Cooking with less salt is one way to reduce the amount of sodium you get from food. Sodium is one of the elements that make up salt. It is found naturally in foods and is also added to certain foods. Depending on your condition and overall health, your health care provider or dietitian may recommend that you reduce your sodium intake. Most people should have less than 2,300 milligrams (mg) of sodium each day. If you have high blood pressure (hypertension), you may need to limit your sodium to 1,500 mg each day. Follow the tips below to help reduce your sodium intake. What are tips for eating less sodium? Reading food labels  Check the food label before buying or using packaged ingredients. Always check the label for the serving size and sodium content.  Look for products with no more than 140 mg of sodium in one serving.  Check the % Daily Value column to see what percent of the daily recommended amount of sodium is provided in one serving of the product. Foods with 5% or less in this column are considered low in sodium. Foods with 20% or higher are considered high in sodium.  Do not choose foods with salt as one of the first three ingredients on the ingredients list. If salt is one of the first three ingredients, it usually means the item is high in sodium.   Shopping  Buy sodium-free or low-sodium products. Look for the following words on food labels: ? Low-sodium. ? Sodium-free. ? Reduced-sodium. ? No salt added. ? Unsalted.  Always check the sodium content even if foods are labeled as low-sodium or no salt added.  Buy fresh foods. Cooking  Use herbs, seasonings without salt, and spices as substitutes for salt.  Use sodium-free baking soda when baking.  Grill, braise, or roast foods to add flavor with less salt.  Avoid adding salt to pasta, rice, or hot cereals.  Drain and rinse canned vegetables, beans, and meat before use.  Avoid adding salt when  cooking sweets and desserts.  Cook with low-sodium ingredients. What foods are high in sodium? Vegetables Regular canned vegetables (not low-sodium or reduced-sodium). Sauerkraut, pickled vegetables, and relishes. Olives. French fries. Onion rings. Regular canned tomato sauce and paste. Regular tomato and vegetable juice. Frozen vegetables in sauces. Grains Instant hot cereals. Bread stuffing, pancake, and biscuit mixes. Croutons. Seasoned rice or pasta mixes. Noodle soup cups. Boxed or frozen macaroni and cheese. Regular salted crackers. Self-rising flour. Rolls. Bagels. Flour tortillas and wraps. Meats and other proteins Meat or fish that is salted, canned, smoked, cured, spiced, or pickled. This includes bacon, ham, sausages, hot dogs, corned beef, chipped beef, meat loaves, salt pork, jerky, pickled herring, anchovies, regular canned tuna, and sardines. Salted nuts. Dairy Processed cheese and cheese spreads. Cheese curds. Blue cheese. Feta cheese. String cheese. Regular cottage cheese. Buttermilk. Canned milk. The items listed above may not be a complete list of foods high in sodium. Actual amounts of sodium may be different depending on processing. Contact a dietitian for more information. What foods are low in sodium? Fruits Fresh, frozen, or canned fruit with no sauce added. Fruit juice. Vegetables Fresh or frozen vegetables with no sauce added. "No salt added" canned vegetables. "No salt added" tomato sauce and paste. Low-sodium or reduced-sodium tomato and vegetable juice. Grains Noodles, pasta, quinoa, rice. Shredded or puffed wheat or puffed rice. Regular or quick oats (not instant). Low-sodium crackers. Low-sodium bread. Whole-grain bread and whole-grain pasta. Unsalted popcorn.   Meats and other proteins Fresh or frozen whole meats, poultry (not injected with sodium), and fish with no sauce added. Unsalted nuts. Dried peas, beans, and lentils without added salt. Unsalted canned beans.  Eggs. Unsalted nut butters. Low-sodium canned tuna or chicken. Dairy Milk. Soy milk. Yogurt. Low-sodium cheeses, such as Swiss, [address], [place], and [employer]. Sherbet or ice cream (keep to  cup per serving). Cream cheese. Fats and oils Unsalted butter or margarine. Other foods Homemade pudding. Sodium-free baking soda and baking powder. Herbs and spices. Low-sodium seasoning mixes. Beverages Coffee and tea. Carbonated beverages. The items listed above may not be a complete list of foods low in sodium. Actual amounts of sodium may be different depending on processing. Contact a dietitian for more information. What are some salt alternatives when cooking? The following are herbs, seasonings, and spices that can be used instead of salt to flavor your food. Herbs should be fresh or dried. Do not choose packaged mixes. Next to the name of the herb, spice, or seasoning are some examples of foods you can pair it with. Herbs  Bay leaves - Soups, meat and vegetable dishes, and spaghetti sauce.  Basil - [employer], soups, pasta, and fish dishes.  Cilantro - Meat, poultry, and vegetable dishes.  Chili powder - Marinades and Mexican dishes.  Chives - Salad dressings and potato dishes.  Cumin - Mexican dishes, couscous, and meat dishes.  Dill - Fish dishes, sauces, and salads.  Fennel - Meat and vegetable dishes, breads, and cookies.  Garlic (do not use garlic salt) - [place] dishes, meat dishes, salad dressings, and sauces.  Marjoram - Soups, potato dishes, and meat dishes.  Oregano - Pizza and spaghetti sauce.  Parsley - Salads, soups, pasta, and meat dishes.  Rosemary - [place] dishes, salad dressings, soups, and red meats.  Saffron - Fish dishes, pasta, and some poultry dishes.  Sage - Stuffings and sauces.  Tarragon - Fish and [employer].  Thyme - Stuffing, meat, and fish dishes. Seasonings  Lemon juice - Fish dishes, poultry dishes, vegetables, and  salads.  Vinegar - Salad dressings, vegetables, and fish dishes. Spices  Cinnamon - Sweet dishes, such as cakes, cookies, and puddings.  Cloves - Gingerbread, puddings, and marinades for meats.  Curry - Vegetable dishes, fish and poultry dishes, and stir-fry dishes.  Ginger - Vegetable dishes, fish dishes, and stir-fry dishes.  Nutmeg - Pasta, vegetables, poultry, fish dishes, and custard. Summary  Cooking with less salt is one way to reduce the amount of sodium that you get from food.  Buy sodium-free or low-sodium products.  Check the food label before using or buying packaged ingredients.  Use herbs, seasonings without salt, and spices as substitutes for salt in foods. This information is not intended to replace advice given to you by your health care provider. Make sure you discuss any questions you have with your health care provider. Document Revised: 09/16/2019 Document Reviewed: 09/16/2019 Elsevier Patient Education  2021 [employer] Inc. Stroke Prevention Some medical conditions and behaviors are associated with a higher chance of having a stroke. You can help prevent a stroke by making nutrition, lifestyle, and other changes, including managing any medical conditions you may have. What nutrition changes can be made?  Eat healthy foods. You can do this by: ? Choosing foods high in fiber, such as fresh fruits and vegetables and whole grains. ? Eating at least 5 or more servings of fruits and vegetables a day. Try to fill half of your plate  at each meal with fruits and vegetables. ? Choosing lean protein foods, such as lean cuts of meat, poultry without skin, fish, tofu, beans, and nuts. ? Eating low-fat dairy products. ? Avoiding foods that are high in salt (sodium). This can help lower blood pressure. ? Avoiding foods that have saturated fat, trans fat, and cholesterol. This can help prevent high cholesterol. ? Avoiding processed and premade foods.  Follow your health  care provider's specific guidelines for losing weight, controlling high blood pressure (hypertension), lowering high cholesterol, and managing diabetes. These may include: ? Reducing your daily calorie intake. ? Limiting your daily sodium intake to 1,500 milligrams (mg). ? Using only healthy fats for cooking, such as olive oil, canola oil, or sunflower oil. ? Counting your daily carbohydrate intake.   What lifestyle changes can be made?  Maintain a healthy weight. Talk to your health care provider about your ideal weight.  Get at least 30 minutes of moderate physical activity at least 5 days a week. Moderate activity includes brisk walking, biking, and swimming.  Do not use any products that contain nicotine or tobacco, such as cigarettes and e-cigarettes. If you need help quitting, ask your health care provider. It may also be helpful to avoid exposure to secondhand smoke.  Limit alcohol intake to no more than 1 drink a day for nonpregnant women and 2 drinks a day for men. One drink equals 12 oz of beer, 5 oz of wine, or 1 oz of hard liquor.  Stop any illegal drug use.  Avoid taking birth control pills. Talk to your health care provider about the risks of taking birth control pills if: ? You are over 82 years old. ? You smoke. ? You get migraines. ? You have ever had a blood clot. What other changes can be made?  Manage your cholesterol levels. ? Eating a healthy diet is important for preventing high cholesterol. If cholesterol cannot be managed through diet alone, you may also need to take medicines. ? Take any prescribed medicines to control your cholesterol as told by your health care provider.  Manage your diabetes. ? Eating a healthy diet and exercising regularly are important parts of managing your blood sugar. If your blood sugar cannot be managed through diet and exercise, you may need to take medicines. ? Take any prescribed medicines to control your diabetes as told by your  health care provider.  Control your hypertension. ? To reduce your risk of stroke, try to keep your blood pressure below 130/80. ? Eating a healthy diet and exercising regularly are an important part of controlling your blood pressure. If your blood pressure cannot be managed through diet and exercise, you may need to take medicines. ? Take any prescribed medicines to control hypertension as told by your health care provider. ? Ask your health care provider if you should monitor your blood pressure at home. ? Have your blood pressure checked every year, even if your blood pressure is normal. Blood pressure increases with age and some medical conditions.  Get evaluated for sleep disorders (sleep apnea). Talk to your health care provider about getting a sleep evaluation if you snore a lot or have excessive sleepiness.  Take over-the-counter and prescription medicines only as told by your health care provider. Aspirin or blood thinners (antiplatelets or anticoagulants) may be recommended to reduce your risk of forming blood clots that can lead to stroke.  Make sure that any other medical conditions you have, such as atrial fibrillation or atherosclerosis,  are managed. What are the warning signs of a stroke? The warning signs of a stroke can be easily remembered as BEFAST.  B is for balance. Signs include: ? Dizziness. ? Loss of balance or coordination. ? Sudden trouble walking.  E is for eyes. Signs include: ? A sudden change in vision. ? Trouble seeing.  F is for face. Signs include: ? Sudden weakness or numbness of the face. ? The face or eyelid drooping to one side.  A is for arms. Signs include: ? Sudden weakness or numbness of the arm, usually on one side of the body.  S is for speech. Signs include: ? Trouble speaking (aphasia). ? Trouble understanding.  T is for time. ? These symptoms may represent a serious problem that is an emergency. Do not wait to see if the symptoms  will go away. Get medical help right away. Call your local emergency services (911 in the [place] Do not drive yourself to the hospital.  Other signs of stroke may include: ? A sudden, severe headache with no known cause. ? Nausea or vomiting. ? Seizure. Where to find more information For more information, visit:  American Stroke Association: www.strokeassociation.org  National Stroke Association: www.stroke.org Summary  You can prevent a stroke by eating healthy, exercising, not smoking, limiting alcohol intake, and managing any medical conditions you may have.  Do not use any products that contain nicotine or tobacco, such as cigarettes and e-cigarettes. If you need help quitting, ask your health care provider. It may also be helpful to avoid exposure to secondhand smoke.  Remember BEFAST for warning signs of stroke. Get help right away if you or a loved one has any of these signs. This information is not intended to replace advice given to you by your health care provider. Make sure you discuss any questions you have with your health care provider. Document Revised: 09/06/2017 Document Reviewed: 10/30/2016 Elsevier Patient Education  2021 [employer].

## 2021-02-02 NOTE — Progress Notes (Signed)
[facility]   Progress Note: General Provider: Wolfgang Phoenix, NP  SUBJECTIVE:   Gary [name] is a 50 y.o. male who  has a past medical history of Alcohol use, Hypercholesteremia, Hypertension, Seizures ([place]), Stroke ([place]), and Tobacco abuse. Patient presents to establish care with this clinic. He reports that in 2017 he had stroke x3 without residual effects. On 12/29/20, he went to the [facility] health emergency room complaining of intermittent episodes of generalized weakness, left-sided facial droop, and slurred speech; he also non-compliance with hypertension and seizure medications. He reports that up until March 2022, he discontinued all of his medications for two years. IMPRESSION: CT head: 1. No evidence of acute intracranial abnormality. 2. 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 3. Paranasal sinus disease. MRI brain with scattered punctate strokes in the left posterior parietoccipital area. 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 Per Dr. Kathyrn Lass health ED note on 01/14/21, the patient has a history of carotid stenosis, however the patient reports that he was not aware of it and did not follow up with vascular surgery. He states that he feels good and denies weakness, chest pain, loss of balance, or coordination. He reports no seizure activity in the last 3 years.He has cut back on smoking since March 2022; now, he smokes three cigarettes per day. He also endorses cutting hard liquor and soda mix from 5-6 to 2-3 drinks a day. He doesn't exercise because he states that he does extensive walking and lifting at work. He admits to consuming fried foods and drinking soda. He  plans to make healthy lifestyle changes. presently, he is adhering to his medications. He has no other complaints. Overall, he states that he's doing well and offers no further complaint    Review of Systems  Constitutional: Negative.   HENT: Negative.   Eyes: Negative.   Respiratory: Negative.   Cardiovascular: Negative.   Gastrointestinal: Negative.   Genitourinary: Negative.   Musculoskeletal: Negative.   Skin: Negative.   Neurological: Negative.   Endo/Heme/Allergies: Negative.   Psychiatric/Behavioral: Negative.      OBJECTIVE: BP (!) 136/92   Pulse 77   Ht _0  (1.778 m)   Wt 214 lb 12.8 oz (97.4 kg)   SpO2 98%   BMI 30.82 kg/m   Wt Readings from Last 3 Encounters:  02/02/21 214 lb 12.8 oz (97.4 kg)  01/14/21 220 lb (99.8 kg)  12/29/20 228 lb (103.4 kg)     Physical Exam Vitals reviewed.  Constitutional:      Appearance: Normal appearance.  HENT:     Head: Normocephalic.  Eyes:     Pupils: Pupils are equal, round, and reactive to light.  Cardiovascular:     Rate and Rhythm: Normal rate and regular rhythm.     Pulses: Normal pulses.     Heart sounds: Normal heart sounds.  Pulmonary:     Effort: Pulmonary effort is normal.     Breath sounds: Normal breath sounds.  Abdominal:     General: Bowel sounds are normal.     Palpations: Abdomen is soft.  Musculoskeletal:        General: Normal range of motion.     Cervical back: Normal range of  motion.  Skin:    General: Skin is warm and dry.  Neurological:     General: No focal deficit present.     Mental Status: He is alert and oriented to person, place, and time.  Psychiatric:        Mood and Affect: Mood normal.        Behavior: Behavior normal.        Thought Content: Thought content normal.        Judgment: Judgment normal.     ASSESSMENT/PLAN:  1. Essential hypertension - Advised to start DASH diet and daily exercise as tolerated. - amLODipine (NORVASC) 10 MG tablet; TAKE ONE TABLET BY MOUTH  EVERY DAY  Dispense: 90 tablet; Refill: 0 - atenolol (TENORMIN) 100 MG tablet; Take 1 tablet (100 mg total) by mouth daily.  Dispense: 90 tablet; Refill: 0 - hydrochlorothiazide (HYDRODIURIL) 25 MG tablet; TAKE ONE TABLET BY MOUTH EVERY DAY  Dispense: 90 tablet; Refill: 0 - losartan (COZAAR) 100 MG tablet; TAKE ONE TABLET BY MOUTH EVERY DAY  Dispense: 90 tablet; Refill: 0  2. Gastroesophageal reflux disease -No medication changes warranted at the present time- omeprazole (PRILOSEC) 20 MG capsule; Take 1 capsule (20 mg total) by mouth daily.  Dispense: 60 capsule; Refill: 0  3. Encounter to establish care with new doctor - CBC - Comp Met (CMET) - TSH - HgB A1c - Lipid Panel With LDL/HDL Ratio  4. Cerebrovascular accident (CVA), unspecified mechanism ([place])  - clopidogrel (PLAVIX) 75 MG tablet; TAKE ONE TABLET BY MOUTH EVERY DAY  Dispense: 90 tablet; Refill: 0 - Ambulatory referral to Neurology  5. Hyperlipidemia, unspecified hyperlipidemia type -Advised to start DASH diet and daily exercise as tolerated. - atorvastatin (LIPITOR) 80 MG tablet; TAKE ONE TABLET BY MOUTH AT BEDTIME  Dispense: 90 tablet; Refill: 0  6. Seizures ([place]) -Advised to continue taking medications as prescribed.  - levETIRAcetam (KEPPRA) 1000 MG tablet; Take 1 tablet (1,000 mg total) by mouth 2 (two) times daily.  Dispense: 180 tablet; Refill: 0 - Ambulatory referral to Neurology  7. Stenosis of carotid artery  - Ambulatory referral to Vascular Surgery   Return in about 2 weeks (around 02/16/2021), or if symptoms worsen or fail to improve.    The patient was given clear instructions to go to ER or return to medical center if symptoms do not improve, worsen or new problems develop. The patient verbalized understanding and agreed with plan of care.  Gary [name], [place]

## 2021-02-03 ENCOUNTER — Other Ambulatory Visit: Payer: Self-pay

## 2021-02-03 LAB — COMPREHENSIVE METABOLIC PANEL
ALT: 84 IU/L — ABNORMAL HIGH (ref 0–44)
AST: 75 IU/L — ABNORMAL HIGH (ref 0–40)
Albumin/Globulin Ratio: 1.5 (ref 1.2–2.2)
Albumin: 4.5 g/dL (ref 4.0–5.0)
Alkaline Phosphatase: 155 IU/L — ABNORMAL HIGH (ref 44–121)
BUN/Creatinine Ratio: 22 — ABNORMAL HIGH (ref 9–20)
BUN: 30 mg/dL — ABNORMAL HIGH (ref 6–24)
Bilirubin Total: 0.3 mg/dL (ref 0.0–1.2)
CO2: 26 mmol/L (ref 20–29)
Calcium: 10.3 mg/dL — ABNORMAL HIGH (ref 8.7–10.2)
Chloride: 97 mmol/L (ref 96–106)
Creatinine, Ser: 1.36 mg/dL — ABNORMAL HIGH (ref 0.76–1.27)
Globulin, Total: 3 g/dL (ref 1.5–4.5)
Glucose: 100 mg/dL — ABNORMAL HIGH (ref 65–99)
Potassium: 5.2 mmol/L (ref 3.5–5.2)
Sodium: 139 mmol/L (ref 134–144)
Total Protein: 7.5 g/dL (ref 6.0–8.5)
eGFR: 64 mL/min/{1.73_m2} (ref >59)

## 2021-02-03 LAB — TSH: TSH: 1.4 u[IU]/mL (ref 0.450–4.500)

## 2021-02-03 LAB — CBC
Hematocrit: 35.5 % — ABNORMAL LOW (ref 37.5–51.0)
Hemoglobin: 12.2 g/dL — ABNORMAL LOW (ref 13.0–17.7)
MCH: 32.5 pg (ref 26.6–33.0)
MCHC: 34.4 g/dL (ref 31.5–35.7)
MCV: 95 fL (ref 79–97)
Platelets: 302 10*3/uL (ref 150–450)
RBC: 3.75 x10E6/uL — ABNORMAL LOW (ref 4.14–5.80)
RDW: 11.6 % (ref 11.6–15.4)
WBC: 8.9 10*3/uL (ref 3.4–10.8)

## 2021-02-03 LAB — LIPID PANEL WITH LDL/HDL RATIO
Cholesterol, Total: 196 mg/dL (ref 100–199)
HDL: 41 mg/dL (ref >39)
LDL Chol Calc (NIH): 122 mg/dL — ABNORMAL HIGH (ref 0–99)
LDL/HDL Ratio: 3 ratio (ref 0.0–3.6)
Triglycerides: 184 mg/dL — ABNORMAL HIGH (ref 0–149)
VLDL Cholesterol Cal: 33 mg/dL (ref 5–40)

## 2021-02-03 LAB — HEMOGLOBIN A1C
Est. average glucose Bld gHb Est-mCnc: 134 mg/dL
Hgb A1c MFr Bld: 6.3 % — ABNORMAL HIGH (ref 4.8–5.6)

## 2021-02-16 ENCOUNTER — Other Ambulatory Visit: Payer: Self-pay

## 2021-02-16 ENCOUNTER — Ambulatory Visit: Payer: Self-pay | Admitting: Gerontology

## 2021-02-16 VITALS — BP 108/71 | HR 69 | Temp 97.2°F | Resp 16 | Ht 71.0 in | Wt 216.3 lb

## 2021-02-16 DIAGNOSIS — F1010 Alcohol abuse, uncomplicated: Secondary | ICD-10-CM

## 2021-02-16 DIAGNOSIS — R748 Abnormal levels of other serum enzymes: Secondary | ICD-10-CM | POA: Insufficient documentation

## 2021-02-16 DIAGNOSIS — G6289 Other specified polyneuropathies: Secondary | ICD-10-CM

## 2021-02-16 DIAGNOSIS — E785 Hyperlipidemia, unspecified: Secondary | ICD-10-CM

## 2021-02-16 DIAGNOSIS — R7303 Prediabetes: Secondary | ICD-10-CM

## 2021-02-16 DIAGNOSIS — G629 Polyneuropathy, unspecified: Secondary | ICD-10-CM | POA: Insufficient documentation

## 2021-02-16 DIAGNOSIS — Z8673 Personal history of transient ischemic attack (TIA), and cerebral infarction without residual deficits: Secondary | ICD-10-CM

## 2021-02-16 MED ORDER — METFORMIN HCL 1000 MG PO TABS
500.0000 mg | ORAL_TABLET | Freq: Every day | ORAL | 0 refills | Status: DC
  Filled 2021-02-16: qty 15, 30d supply, fill #0

## 2021-02-16 NOTE — Patient Instructions (Addendum)
Heart-Healthy Eating Plan Heart-healthy meal planning includes:  Eating less unhealthy fats.  Eating more healthy fats.  Making other changes in your diet. Talk with your doctor or a diet specialist (dietitian) to create an eating plan that is right for you. What is my plan? Your doctor may recommend an eating plan that includes:  Total fat: ______% or less of total calories a day.  Saturated fat: ______% or less of total calories a day.  Cholesterol: less than _________mg a day. What are tips for following this plan? Cooking Avoid frying your food. Try to bake, boil, grill, or broil it instead. You can also reduce fat by:  Removing the skin from poultry.  Removing all visible fats from meats.  Steaming vegetables in water or broth. Meal planning  At meals, divide your plate into four equal parts: ? Fill one-half of your plate with vegetables and green salads. ? Fill one-fourth of your plate with whole grains. ? Fill one-fourth of your plate with lean protein foods.  Eat 4-5 servings of vegetables per day. A serving of vegetables is: ? 1 cup of raw or cooked vegetables. ? 2 cups of raw leafy greens.  Eat 4-5 servings of fruit per day. A serving of fruit is: ? 1 medium whole fruit. ?  cup of dried fruit. ?  cup of fresh, frozen, or canned fruit. ?  cup of 100% fruit juice.  Eat more foods that have soluble fiber. These are apples, broccoli, carrots, beans, peas, and barley. Try to get 20-30 g of fiber per day.  Eat 4-5 servings of nuts, legumes, and seeds per week: ? 1 serving of dried beans or legumes equals  cup after being cooked. ? 1 serving of nuts is  cup. ? 1 serving of seeds equals 1 tablespoon.   General information  Eat more home-cooked food. Eat less restaurant, buffet, and fast food.  Limit or avoid alcohol.  Limit foods that are high in starch and sugar.  Avoid fried foods.  Lose weight if you are overweight.  Keep track of how much salt  (sodium) you eat. This is important if you have high blood pressure. Ask your doctor to tell you more about this.  Try to add vegetarian meals each week. Fats  Choose healthy fats. These include olive oil and canola oil, flaxseeds, walnuts, almonds, and seeds.  Eat more omega-3 fats. These include salmon, mackerel, sardines, tuna, flaxseed oil, and ground flaxseeds. Try to eat fish at least 2 times each week.  Check food labels. Avoid foods with trans fats or high amounts of saturated fat.  Limit saturated fats. ? These are often found in animal products, such as meats, butter, and cream. ? These are also found in plant foods, such as palm oil, palm kernel oil, and coconut oil.  Avoid foods with partially hydrogenated oils in them. These have trans fats. Examples are stick margarine, some tub margarines, cookies, crackers, and other baked goods. What foods can I eat? Fruits All fresh, canned (in natural juice), or frozen fruits. Vegetables Fresh or frozen vegetables (raw, steamed, roasted, or grilled). Green salads. Grains Most grains. Choose whole wheat and whole grains most of the time. Rice and pasta, including brown rice and pastas made with whole wheat. Meats and other proteins Lean, well-trimmed beef, veal, pork, and lamb. Chicken and turkey without skin. All fish and shellfish. Wild duck, rabbit, pheasant, and venison. Egg whites or low-cholesterol egg substitutes. Dried beans, peas, lentils, and tofu. Seeds and   most nuts. Dairy Low-fat or nonfat cheeses, including ricotta and mozzarella. Skim or 1% milk that is liquid, powdered, or evaporated. Buttermilk that is made with low-fat milk. Nonfat or low-fat yogurt. Fats and oils Non-hydrogenated (trans-free) margarines. Vegetable oils, including soybean, sesame, sunflower, olive, peanut, safflower, corn, canola, and cottonseed. Salad dressings or mayonnaise made with a vegetable oil. Beverages Mineral water. Coffee and tea. Diet  carbonated beverages. Sweets and desserts Sherbet, gelatin, and fruit ice. Small amounts of dark chocolate. Limit all sweets and desserts. Seasonings and condiments All seasonings and condiments. The items listed above may not be a complete list of foods and drinks you can eat. Contact a dietitian for more options. What foods should I avoid? Fruits Canned fruit in heavy syrup. Fruit in cream or butter sauce. Fried fruit. Limit coconut. Vegetables Vegetables cooked in cheese, cream, or butter sauce. Fried vegetables. Grains Breads that are made with saturated or trans fats, oils, or whole milk. Croissants. Sweet rolls. Donuts. High-fat crackers, such as cheese crackers. Meats and other proteins Fatty meats, such as hot dogs, ribs, sausage, bacon, rib-eye roast or steak. High-fat deli meats, such as salami and bologna. Caviar. Domestic duck and goose. Organ meats, such as liver. Dairy Cream, sour cream, cream cheese, and creamed cottage cheese. Whole-milk cheeses. Whole or 2% milk that is liquid, evaporated, or condensed. Whole buttermilk. Cream sauce or high-fat cheese sauce. Yogurt that is made from whole milk. Fats and oils Meat fat, or shortening. Cocoa butter, hydrogenated oils, palm oil, coconut oil, palm kernel oil. Solid fats and shortenings, including bacon fat, salt pork, lard, and butter. Nondairy cream substitutes. Salad dressings with cheese or sour cream. Beverages Regular sodas and juice drinks with added sugar. Sweets and desserts Frosting. Pudding. Cookies. Cakes. Pies. Milk chocolate or white chocolate. Buttered syrups. Full-fat ice cream or ice cream drinks. The items listed above may not be a complete list of foods and drinks to avoid. Contact a dietitian for more information. Summary  Heart-healthy meal planning includes eating less unhealthy fats, eating more healthy fats, and making other changes in your diet.  Eat a balanced diet. This includes fruits and  vegetables, low-fat or nonfat dairy, lean protein, nuts and legumes, whole grains, and heart-healthy oils and fats. This information is not intended to replace advice given to you by your health care provider. Make sure you discuss any questions you have with your health care provider. Document Revised: 11/28/2017 Document Reviewed: 11/01/2017 Elsevier Patient Education  2021 Elsevier Inc.  Prediabetes Eating Plan Prediabetes is a condition that causes blood sugar (glucose) levels to be higher than normal. This increases the risk for developing type 2 diabetes (type 2 diabetes mellitus). Working with a health care provider or nutrition specialist (dietitian) to make diet and lifestyle changes can help prevent the onset of diabetes. These changes may help you:  Control your blood glucose levels.  Improve your cholesterol levels.  Manage your blood pressure. What are tips for following this plan? Reading food labels  Read food labels to check the amount of fat, salt (sodium), and sugar in prepackaged foods. Avoid foods that have: ? Saturated fats. ? Trans fats. ? Added sugars.  Avoid foods that have more than 300 milligrams (mg) of sodium per serving. Limit your sodium intake to less than 2,300 mg each day. Shopping  Avoid buying pre-made and processed foods.  Avoid buying drinks with added sugar. Cooking  Cook with olive oil. Do not use butter, lard, or ghee.  Bake,  broil, grill, steam, or boil foods. Avoid frying. Meal planning  Work with your dietitian to create an eating plan that is right for you. This may include tracking how many calories you take in each day. Use a food diary, notebook, or mobile application to track what you eat at each meal.  Consider following a Mediterranean diet. This includes: ? Eating several servings of fresh fruits and vegetables each day. ? Eating fish at least twice a week. ? Eating one serving each day of whole grains, beans, nuts, and  seeds. ? Using olive oil instead of other fats. ? Limiting alcohol. ? Limiting red meat. ? Using nonfat or low-fat dairy products.  Consider following a plant-based diet. This includes dietary choices that focus on eating mostly vegetables and fruit, grains, beans, nuts, and seeds.  If you have high blood pressure, you may need to limit your sodium intake or follow a diet such as the DASH (Dietary Approaches to Stop Hypertension) eating plan. The DASH diet aims to lower high blood pressure.   Lifestyle  Set weight loss goals with help from your health care team. It is recommended that most people with prediabetes lose 7% of their body weight.  Exercise for at least 30 minutes 5 or more days a week.  Attend a support group or seek support from a mental health counselor.  Take over-the-counter and prescription medicines only as told by your health care provider. What foods are recommended? Fruits Berries. Bananas. Apples. Oranges. Grapes. Papaya. Mango. Pomegranate. Kiwi. Grapefruit. Cherries. Vegetables Lettuce. Spinach. Peas. Beets. Cauliflower. Cabbage. Broccoli. Carrots. Tomatoes. Squash. Eggplant. Herbs. Peppers. Onions. Cucumbers. Brussels sprouts. Grains Whole grains, such as whole-wheat or whole-grain breads, crackers, cereals, and pasta. Unsweetened oatmeal. Bulgur. Barley. Quinoa. Brown rice. Corn or whole-wheat flour tortillas or taco shells. Meats and other proteins Seafood. Poultry without skin. Lean cuts of pork and beef. Tofu. Eggs. Nuts. Beans. Dairy Low-fat or fat-free dairy products, such as yogurt, cottage cheese, and cheese. Beverages Water. Tea. Coffee. Sugar-free or diet soda. Seltzer water. Low-fat or nonfat milk. Milk alternatives, such as soy or almond milk. Fats and oils Olive oil. Canola oil. Sunflower oil. Grapeseed oil. Avocado. Walnuts. Sweets and desserts Sugar-free or low-fat pudding. Sugar-free or low-fat ice cream and other frozen treats. Seasonings  and condiments Herbs. Sodium-free spices. Mustard. Relish. Low-salt, low-sugar ketchup. Low-salt, low-sugar barbecue sauce. Low-fat or fat-free mayonnaise. The items listed above may not be a complete list of recommended foods and beverages. Contact a dietitian for more information. What foods are not recommended? Fruits Fruits canned with syrup. Vegetables Canned vegetables. Frozen vegetables with butter or cream sauce. Grains Refined white flour and flour products, such as bread, pasta, snack foods, and cereals. Meats and other proteins Fatty cuts of meat. Poultry with skin. Breaded or fried meat. Processed meats. Dairy Full-fat yogurt, cheese, or milk. Beverages Sweetened drinks, such as iced tea and soda. Fats and oils Butter. Lard. Ghee. Sweets and desserts Baked goods, such as cake, cupcakes, pastries, cookies, and cheesecake. Seasonings and condiments Spice mixes with added salt. Ketchup. Barbecue sauce. Mayonnaise. The items listed above may not be a complete list of foods and beverages that are not recommended. Contact a dietitian for more information. Where to find more information  American Diabetes Association: www.diabetes.org Summary  You may need to make diet and lifestyle changes to help prevent the onset of diabetes. These changes can help you control blood sugar, improve cholesterol levels, and manage blood pressure.  Set weight  loss goals with help from your health care team. It is recommended that most people with prediabetes lose 7% of their body weight.  Consider following a Mediterranean diet. This includes eating plenty of fresh fruits and vegetables, whole grains, beans, nuts, seeds, fish, and low-fat dairy, and using olive oil instead of other fats. This information is not intended to replace advice given to you by your health care provider. Make sure you discuss any questions you have with your health care provider. Document Revised: 12/24/2019 Document  Reviewed: 12/24/2019 Elsevier Patient Education  2021 [employer].

## 2021-02-16 NOTE — Progress Notes (Signed)
Established Patient Office Visit  Subjective:  Patient ID: Gary [name], male    DOB: September 06, 1971  Age: 50 y.o. MRN: [id number]  CC:  Chief Complaint  Patient presents with  . Follow-up    Lab results    HPI Gary [name] is a 50 y.o. male who  has a past medical history of Alcohol use, Hypercholesteremia, Hypertension, Seizures ([place]), Stroke ([place]), and Tobacco abuse. He presents for follow up and lab review. His HgbA1c done on 02/02/21 was 6.3%, his Liver enzymes are elevated, AST was 75 and ALT 84, he continues to consume 3-4 mixed alcoholic drinks daily. He denies jaundice, dark urine and right upper quadrant pain. His serum creatinine decreased from 1.88 mg/dl to 1.36 mg/dl, he states that he's not drinking enough water. His Triglycerides was 184 and LDL 122 mg/dl.  He also states that he continues to experience intermittent shortness of breath with exertion, especially when he walks more than 4 blocks. He denies chest tightness, cough and wheezing. He continues to smoke 1 pack of cigarette in 3 days and admits the desire to quit. He states that he continues to experience more episodes of intermittent forgetfulness which is associated with dizziness, confusion and finding it difficult to speak. He states that it started after he was discharged from with hospital on 01/14/21 with stroke. He also c/o peripheral neuropathy to bilateral hands, which results to his dropping of objects. He states that he has not followed up with a Neurologist since his CVA one month ago. Overall, he states that he's doing well and offers no further complaint.  Past Medical History:  Diagnosis Date  . Alcohol use   . Hypercholesteremia   . Hypertension   . Seizures ([place])   . Stroke [facility])    TIA 12/29/20, re-admitted 01/14/21 for sequelae  . Tobacco abuse     Past Surgical History:  Procedure Laterality Date  . NO PAST SURGERIES      Family History  Problem Relation Age of Onset  . Hypertension Mother    . Other Father        unknown medical history    Social History   Socioeconomic History  . Marital status: Divorced    Spouse name: Not on file  . Number of children: 4  . Years of education: Not on file  . Highest education level: High school graduate  Occupational History  . Not on file  Tobacco Use  . Smoking status: Current Some Day Smoker    Packs/day: 0.33  . Smokeless tobacco: Never Used  Vaping Use  . Vaping Use: Never used  Substance and Sexual Activity  . Alcohol use: Yes    Alcohol/week: 9.0 standard drinks    Types: 9 Cans of beer per week    Comment: 3-4 times per week  . Drug use: No  . Sexual activity: Yes    Birth control/protection: None  Other Topics Concern  . Not on file  Social History Narrative   Lives at home with his aunt   Right handed   Drinks no caffeine   Social Determinants of Health   Financial Resource Strain: Not on file  Food Insecurity: No Food Insecurity  . Worried About Charity fundraiser in the Last Year: Never true  . Ran Out of Food in the Last Year: Never true  Transportation Needs: No Transportation Needs  . Lack of Transportation (Medical): No  . Lack of Transportation (Non-Medical): No  Physical Activity: Not on file  Stress: Not on file  Social Connections: Not on file  Intimate Partner Violence: Not on file    Outpatient Medications Prior to Visit  Medication Sig Dispense Refill  . amLODipine (NORVASC) 10 MG tablet TAKE ONE TABLET BY MOUTH EVERY DAY 90 tablet 0  . aspirin 81 MG tablet Take 1 tablet (81 mg total) by mouth daily. 30 tablet 0  . atenolol (TENORMIN) 50 MG tablet Take 2 tablets (100 mg total) by mouth daily. 180 tablet 0  . atorvastatin (LIPITOR) 80 MG tablet TAKE ONE TABLET BY MOUTH AT BEDTIME 90 tablet 0  . clopidogrel (PLAVIX) 75 MG tablet TAKE ONE TABLET BY MOUTH EVERY DAY 90 tablet 0  . hydrochlorothiazide (HYDRODIURIL) 25 MG tablet TAKE ONE TABLET BY MOUTH EVERY DAY 90 tablet 0  . levETIRAcetam  (KEPPRA) 1000 MG tablet Take 1 tablet (1,000 mg total) by mouth 2 (two) times daily. 180 tablet 0  . losartan (COZAAR) 100 MG tablet TAKE ONE TABLET BY MOUTH EVERY DAY 90 tablet 0  . omeprazole (PRILOSEC) 20 MG capsule Take 1 capsule (20 mg total) by mouth daily. 60 capsule 0   No facility-administered medications prior to visit.    No Known Allergies  ROS Review of Systems  Constitutional: Negative.   Eyes: Negative.   Respiratory: Negative.   Cardiovascular: Negative.   Gastrointestinal: Negative.   Skin: Negative.   Neurological: Positive for speech difficulty (intermittent delay in speech) and numbness (.peripheral neuropathy, forgetfulness).  Hematological: Negative.   Psychiatric/Behavioral: Negative.       Objective:    Physical Exam HENT:     Head: Normocephalic and atraumatic.  Eyes:     General: No visual field deficit.    Extraocular Movements: Extraocular movements intact.     Conjunctiva/sclera: Conjunctivae normal.     Pupils: Pupils are equal, round, and reactive to light.  Cardiovascular:     Rate and Rhythm: Normal rate and regular rhythm.     Pulses: Normal pulses.     Heart sounds: Normal heart sounds.  Pulmonary:     Effort: Pulmonary effort is normal.     Breath sounds: Normal breath sounds.  Musculoskeletal:        General: Normal range of motion.  Skin:    General: Skin is warm.  Neurological:     General: No focal deficit present.     Mental Status: He is alert and oriented to person, place, and time. Mental status is at baseline.     GCS: GCS eye subscore is 4. GCS verbal subscore is 5. GCS motor subscore is 6.     Cranial Nerves: Cranial nerves are intact. No cranial nerve deficit, dysarthria or facial asymmetry.     Sensory: Sensation is intact.     Motor: Motor function is intact.     Coordination: Coordination is intact.     Gait: Gait is intact.  Psychiatric:        Mood and Affect: Mood normal.        Behavior: Behavior normal.         Thought Content: Thought content normal.        Judgment: Judgment normal.     BP 108/71 (BP Location: Left Arm, Patient Position: Sitting, Cuff Size: Large)   Pulse 69   Temp (!) 97.2 F (36.2 C)   Resp 16   Ht _0  (1.803 m)   Wt 216 lb 4.8 oz (98.1 kg)   SpO2 96%   BMI 30.17 kg/m  Wt Readings from Last 3 Encounters:  02/16/21 216 lb 4.8 oz (98.1 kg)  02/02/21 214 lb 12.8 oz (97.4 kg)  01/14/21 220 lb (99.8 kg)   Encouraged weight loss  Health Maintenance Due  Topic Date Due  . PNEUMOCOCCAL POLYSACCHARIDE VACCINE AGE 33-64 HIGH RISK  Never done  . COVID-19 Vaccine (1) Never done  . FOOT EXAM  Never done  . OPHTHALMOLOGY EXAM  Never done  . Hepatitis C Screening  Never done  . TETANUS/TDAP  Never done  . COLONOSCOPY (Pts 45-30yr Insurance coverage will need to be confirmed)  Never done    There are no preventive care reminders to display for this patient.  Lab Results  Component Value Date   TSH 1.400 02/02/2021   Lab Results  Component Value Date   WBC 8.9 02/02/2021   HGB 12.2 (L) 02/02/2021   HCT 35.5 (L) 02/02/2021   MCV 95 02/02/2021   PLT 302 02/02/2021   Lab Results  Component Value Date   NA 139 02/02/2021   K 5.2 02/02/2021   CO2 26 02/02/2021   GLUCOSE 100 (H) 02/02/2021   BUN 30 (H) 02/02/2021   CREATININE 1.36 (H) 02/02/2021   BILITOT 0.3 02/02/2021   ALKPHOS 155 (H) 02/02/2021   AST 75 (H) 02/02/2021   ALT 84 (H) 02/02/2021   PROT 7.5 02/02/2021   ALBUMIN 4.5 02/02/2021   CALCIUM 10.3 (H) 02/02/2021   ANIONGAP 10 01/14/2021   EGFR 64 02/02/2021   Lab Results  Component Value Date   CHOL 196 02/02/2021   Lab Results  Component Value Date   HDL 41 02/02/2021   Lab Results  Component Value Date   LDLCALC 122 (H) 02/02/2021   Lab Results  Component Value Date   TRIG 184 (H) 02/02/2021   Lab Results  Component Value Date   CHOLHDL 6.3 12/29/2020   Lab Results  Component Value Date   HGBA1C 6.3 (H) 02/02/2021       Assessment & Plan:     1. Alcohol abuse -He was strongly encouraged to abstain from alcohol consumption.  2. Elevated liver enzymes - His liver enzymes were elevated, he was encouraged on alcohol abstinence.  3. History of stroke - He will follow up - Ambulatory referral to Neurology-He was educated on the signs and symptoms of Stroke and advised to go to the ED.  4. Other polyneuropathy -Same as #3 - Ambulatory referral to Neurology  5. Prediabetes - His HgbA1c was 6.3%, was started on Metformin, advised not to take Metformin and drink alcohol, and other side effects, advised to notify clinic. He was encouraged to continue on low carb/non concentrated sweet diet and exercise as tolerated. - Ambulatory referral to Ophthalmology - metFORMIN (GLUCOPHAGE) 1000 MG tablet; Take 0.5 tablets (500 mg total) by mouth daily with breakfast.  Dispense: 30 tablet; Refill: 0  6. Hyperlipidemia, unspecified hyperlipidemia type - He will continue on current regimen, advised to continue on low fat/cholesterol diet and exercise as tolerated.   Follow-up: Return in about 5 weeks (around 03/23/2021), or if symptoms worsen or fail to improve.    Jakyrah Holladay EJerold Coombe NP

## 2021-02-17 ENCOUNTER — Other Ambulatory Visit: Payer: Self-pay

## 2021-02-21 ENCOUNTER — Encounter: Payer: Self-pay | Admitting: Neurology

## 2021-02-27 ENCOUNTER — Other Ambulatory Visit: Payer: Self-pay

## 2021-02-27 ENCOUNTER — Ambulatory Visit (INDEPENDENT_AMBULATORY_CARE_PROVIDER_SITE_OTHER): Payer: Self-pay | Admitting: Neurology

## 2021-02-27 ENCOUNTER — Encounter: Payer: Self-pay | Admitting: Neurology

## 2021-02-27 VITALS — BP 113/75 | HR 75 | Ht 71.0 in | Wt 216.2 lb

## 2021-02-27 DIAGNOSIS — I6529 Occlusion and stenosis of unspecified carotid artery: Secondary | ICD-10-CM

## 2021-02-27 DIAGNOSIS — G40209 Localization-related (focal) (partial) symptomatic epilepsy and epileptic syndromes with complex partial seizures, not intractable, without status epilepticus: Secondary | ICD-10-CM

## 2021-02-27 DIAGNOSIS — I63412 Cerebral infarction due to embolism of left middle cerebral artery: Secondary | ICD-10-CM

## 2021-02-27 MED ORDER — LEVETIRACETAM 1000 MG PO TABS
ORAL_TABLET | ORAL | 3 refills | Status: DC
  Filled 2021-02-27: qty 90, 30d supply, fill #0
  Filled 2021-04-06: qty 90, 30d supply, fill #1
  Filled 2021-05-10: qty 90, 30d supply, fill #2

## 2021-02-27 NOTE — Patient Instructions (Signed)
Good to meet you.  1. Increase Keppra 1000mg : take 1 and 1/2 tablets twice a day  2. Keep a calendar of the spells  3. Continue control of blood pressure, cholesterol, sugar levels, continue with aspirin and Plavix, follow-up with Vascular surgery as scheduled  4. Continue with reduction in alcohol  5. For any sudden changes in symptoms, go to ER  6. Follow-up in 3 months, call for any changes   Seizure Precautions: 1. If medication has been prescribed for you to prevent seizures, take it exactly as directed.  Do not stop taking the medicine without talking to your doctor first, even if you have not had a seizure in a long time.   2. Avoid activities in which a seizure would cause danger to yourself or to others.  Don't operate dangerous machinery, swim alone, or climb in high or dangerous places, such as on ladders, roofs, or girders.  Do not drive unless your doctor says you may.  3. If you have any warning that you may have a seizure, lay down in a safe place where you can't hurt yourself.    4.  No driving for 6 months from last seizure, as per [facility].   Please refer to the following link on the Epilepsy Foundation of America's website for more information: http://www.epilepsyfoundation.org/answerplace/Social/driving/drivingu.cfm   5.  Maintain good sleep hygiene.   6.  Contact your doctor if you have any problems that may be related to the medicine you are taking.  7.  Call 911 and bring the patient back to the ED if:        A.  The seizure lasts longer than 5 minutes.       B.  The patient doesn't awaken shortly after the seizure  C.  The patient has new problems such as difficulty seeing, speaking or moving  D.  The patient was injured during the seizure  E.  The patient has a temperature over 102 F (39C)  F.  The patient vomited and now is having trouble breathing

## 2021-02-27 NOTE — Progress Notes (Signed)
NEUROLOGY CONSULTATION NOTE  Gary [name] MRN: [id number] DOB: [birth year]  Referring provider: Eulogio Bear, NP Primary care provider: Eulogio Bear, NP  Reason for consult:  seizure   Thank you for your kind referral of Gary [name] for consultation of the above symptoms. Although his history is well known to you, please allow me to reiterate it for the purpose of our medical record. He is alone in the office today. Records and images were personally reviewed where available.   HISTORY OF PRESENT ILLNESS: This is a 50 year old right-handed man with a history of hypertension, hyperlipidemia, prediabetes, right occipital stroke in 2017 with subsequent seizures, presenting for evaluation after admission last 12/29/20 for new stroke. Records from his hospitalization were reviewed. He was evaluated by neurologist Dr. 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 Seizures consisted of loss of consciousness, he had an EEG and MRI in [place]. At that time he was reporting intermittent dizziness/lightheadedness. Levetiracetam was increased to 1000mg  BID. He was lost to follow-up and states that his last typical seizure with loss of consciousness was in 2018 so he self-discontinued Levetiracetam and all his other medications in 2019. On 12/29/20, he went to the ER for episodic drooping of the left side of his face, slurred speech, and generalized weakness. He would reported recurrent episodes in one week where he had sudden onset disorientation/confusion then generalized weakness, having to hold on to anything nearby until the sensation passes in 5-10 minutes. He fell when he attempted to walk during one of them. I personally reviewed MRI brain done 12/2020 which showed approximately 5 punctate areas of acute infarct in the left frontal parietal cortex compatible with small emboli, chronic right occipital infarct. CTA head and neck showed intracranial atherosclerotic disease with  moderate stenosis of left M1, cavernous right ICA, mild to moderate stenosis of cavernous left ICA. Echocardiogram showed EF 55-60%, normal left atrium. He was discharged home on atorvastatin 80mg  daily, aspirin 81mg  daily, and Plavix 75mg  daily for 90 days. He has been scheduled to see Vascular Surgery next week. He had an EEG which was normal and Levetiracetam 1000mg  BID was restarted. He was back in the ER 2 weeks later reporting continued episodes of dizziness, leg weakness, and confusion. Repeat MRI brain which did not show any new changes from prior scan. He states that with the new stroke, his right hand goes numb and he is very forgetful. He continues to have daily episodes where he loses his train of thought, wants to say a word but it won't come out. He feels dazed, confused, unable to speak or understand what people are saying. The left side of his mouth goes crooked, if he is drinking water, it would spill out of his mouth. He feels both legs get weak. The episodes still last 5 minutes. He would feel dizzy with a slight headache and have to sit down or be still. No focal weakness after the episodes. He lives alone. He denies any olfactory/gustatory hallucinations, deja vu, rising epigastric sensation, focal numbness/tingling/weakness, myoclonic jerks. He denies missing medications, no side effects on Levetiracetam. He works doing 01/2021 at and has had to sit down because his lower half/hips hurt and legs feel heavy. No bowel/bladder dysfunction. His calves feel numb and tight. He has occasional back pain. He continues to drink 2-3 alcohol drinks a day but states he has cut down. LFTs showed elevated AST 75 and ALT 84.   Epilepsy Risk Factors:  Right occipital stroke.  Otherwise he had a normal birth and early development.  There is no history of febrile convulsions, CNS infections such as meningitis/encephalitis, significant traumatic brain injury, neurosurgical procedures, or family  history of seizures.   PAST MEDICAL HISTORY: Past Medical History:  Diagnosis Date  . Alcohol use   . Hypercholesteremia   . Hypertension   . Seizures (HCC)   . Stroke [facility])    TIA 12/29/20, re-admitted 01/14/21 for sequelae  . Tobacco abuse     PAST SURGICAL HISTORY: Past Surgical History:  Procedure Laterality Date  . NO PAST SURGERIES      MEDICATIONS: Current Outpatient Medications on File Prior to Visit  Medication Sig Dispense Refill  . amLODipine (NORVASC) 10 MG tablet TAKE ONE TABLET BY MOUTH EVERY DAY 90 tablet 0  . aspirin 81 MG tablet Take 1 tablet (81 mg total) by mouth daily. 30 tablet 0  . atenolol (TENORMIN) 50 MG tablet Take 2 tablets (100 mg total) by mouth daily. 180 tablet 0  . atorvastatin (LIPITOR) 80 MG tablet TAKE ONE TABLET BY MOUTH AT BEDTIME 90 tablet 0  . clopidogrel (PLAVIX) 75 MG tablet TAKE ONE TABLET BY MOUTH EVERY DAY 90 tablet 0  . hydrochlorothiazide (HYDRODIURIL) 25 MG tablet TAKE ONE TABLET BY MOUTH EVERY DAY 90 tablet 0  . levETIRAcetam (KEPPRA) 1000 MG tablet Take 1 tablet (1,000 mg total) by mouth 2 (two) times daily. 180 tablet 0  . losartan (COZAAR) 100 MG tablet TAKE ONE TABLET BY MOUTH EVERY DAY 90 tablet 0  . metFORMIN (GLUCOPHAGE) 1000 MG tablet Take 0.5 tablets (500 mg total) by mouth daily with breakfast. 30 tablet 0  . omeprazole (PRILOSEC) 20 MG capsule Take 1 capsule (20 mg total) by mouth daily. 60 capsule 0   No current facility-administered medications on file prior to visit.    ALLERGIES: No Known Allergies  FAMILY HISTORY: Family History  Problem Relation Age of Onset  . Hypertension Mother   . Other Father        unknown medical history    SOCIAL HISTORY: Social History   Socioeconomic History  . Marital status: Divorced    Spouse name: Not on file  . Number of children: 4  . Years of education: Not on file  . Highest education level: High school graduate  Occupational History  . Not on file  Tobacco Use   . Smoking status: Current Some Day Smoker    Packs/day: 0.33  . Smokeless tobacco: Never Used  Vaping Use  . Vaping Use: Never used  Substance and Sexual Activity  . Alcohol use: Yes    Alcohol/week: 9.0 standard drinks    Types: 9 Cans of beer per week    Comment: 3-4 times per week  . Drug use: No  . Sexual activity: Yes    Birth control/protection: None  Other Topics Concern  . Not on file  Social History Narrative   Lives at home with his aunt   Right handed   Drinks no caffeine   Social Determinants of Health   Financial Resource Strain: Not on file  Food Insecurity: No Food Insecurity  . Worried About Programme researcher, broadcasting/film/video in the Last Year: Never true  . Ran Out of Food in the Last Year: Never true  Transportation Needs: No Transportation Needs  . Lack of Transportation (Medical): No  . Lack of Transportation (Non-Medical): No  Physical Activity: Not on file  Stress: Not on file  Social Connections: Not on file  Intimate Partner Violence: Not on file     PHYSICAL EXAM: Vitals:   02/27/21 1006  BP: 113/75  Pulse: 75  SpO2: 100%   General: No acute distress Head:  Normocephalic/atraumatic Skin/Extremities: No rash, no edema Neurological Exam: Mental status: alert and oriented to person, place, and time, no dysarthria or aphasia, Fund of knowledge is appropriate.  Recent and remote memory are intact, 3/3 delayed recall..  Attention and concentration are normal.    Cranial nerves: CN I: not tested CN II: pupils equal, round and reactive to light, visual fields intact CN III, IV, VI:  full range of motion, no nystagmus, no ptosis CN V: facial sensation intact CN VII: upper and lower face symmetric CN VIII: hearing intact to conversation Bulk & Tone: normal, no fasciculations. Motor: 5/5 throughout with no pronator drift. Sensation: intact to light touch, cold, pin, vibration sense except for decreased pin on left 4th and 5th digits.Romberg test negative Deep  Tendon Reflexes: +1 throughout Cerebellar: no incoordination on finger to nose testing Gait: narrow-based and steady, able to tandem walk adequately. Tremor: none   IMPRESSION: This is a 50 year old right-handed man with a history of hypertension, hyperlipidemia, prediabetes, right occipital stroke in 2017 with subsequent seizures with loss of consciousness, presenting for evaluation after admission last 12/29/20 for new stroke with brain MRI showing embolic-appearing punctate infarcts over the left frontal and parietal cortex. MRA showed moderate stenosis of left M1, mild to moderate stenosis of cavernous left ICA, and moderate stenosis of cavernous right ICA. Stroke felt due to embolism from left MCA, he was discharged home on aspirin, Plavix, atorvastatin. He continues to report daily episodes of left facial droop with confusion, word-finding difficulties, inability to communicate, bilateral leg weakness lasting 5 minutes. BP today 113/75. Etiology of recurrent spells unclear, seizures is a possibility, versus hypoperfusion in areas where stenosis is present. Increase Levetiracetam to 1500mg  BID, if no change in symptoms we will do a 48-hour EEG. Continue control of vascular risk factors, aspirin, Plavix. He knows to go to the ER for any sudden change in symptoms. We also discussed how alcohol can trigger seizures, alcohol reduction and eventual cessation encouraged. [place] driving laws were discussed with the patient, and he knows to stop driving after an episode of loss of awareness, until 6 months event-free. Follow-up in 3 months, call for any changes.   Thank you for allowing me to participate in the care of this patient. Please do not hesitate to call for any questions or concerns.   , M.D.  CC: Patrcia Dolly, NP

## 2021-03-01 ENCOUNTER — Ambulatory Visit: Payer: Self-pay | Admitting: Pharmacy Technician

## 2021-03-01 ENCOUNTER — Other Ambulatory Visit: Payer: Self-pay

## 2021-03-01 DIAGNOSIS — Z79899 Other long term (current) drug therapy: Secondary | ICD-10-CM

## 2021-03-01 NOTE — Progress Notes (Signed)
Completed [facility] application and contract.  Patient agreed to all terms of the [facility] contract.    Patient approved to receive medication assistance at [facility] until time for re-certification in 2023, and as long as eligibility criteria continues to be met.    Provided patient with community resource material based on his particular needs.    Gary [name]. Gary [name] Care Manager [facility]  

## 2021-03-07 ENCOUNTER — Other Ambulatory Visit: Payer: Self-pay

## 2021-03-09 ENCOUNTER — Other Ambulatory Visit: Payer: Self-pay

## 2021-03-09 ENCOUNTER — Encounter: Payer: Self-pay | Admitting: Vascular Surgery

## 2021-03-09 ENCOUNTER — Ambulatory Visit (INDEPENDENT_AMBULATORY_CARE_PROVIDER_SITE_OTHER): Payer: Self-pay | Admitting: Vascular Surgery

## 2021-03-09 VITALS — BP 132/84 | HR 80 | Temp 97.9°F | Resp 20 | Ht 71.0 in | Wt 215.0 lb

## 2021-03-09 DIAGNOSIS — I739 Peripheral vascular disease, unspecified: Secondary | ICD-10-CM

## 2021-03-09 NOTE — Progress Notes (Signed)
Referring Physician: Jonna Clark NP  Patient name: Gary [name] MRN: [id number] DOB: [birth year] Sex: male  REASON FOR CONSULT: Intracranial carotid stenosis  HPI: Gary [name] is a 50 y.o. male, who has had 2 prior strokes in the past.  Most recent stroke was an embolic stroke to the left brain thought to be secondary to arterial arterial embolus from the middle cerebral artery according to neurology.  Patient has recovered somewhat from his stroke.  He still has some memory issues and has some numbness and tingling in his hands.  His seizures thought to be due to his prior strokes.  He continues to smoke.  He was counseled against this today.  During the course of his work-up for his recent stroke he had an MRI of the brain which showed multiple infarcts in the left frontoparietal region and a chronic infarct in the right cerebellar region.  He also had a CT angiogram of the neck which showed some stenosis of the cavernous portion of the internal carotid artery but otherwise no significant flow-limiting stenosis of the cervical portion of the internal carotid artery.  He also had a negative bubble study by echo.  Other medical problems include hypertension elevated cholesterol.  He does not have a family history of early strokes.  He did have one relative who died of a brain aneurysm.  He is currently on Plavix aspirin and a statin.  Of note he also complains of bilateral lower extremity weakness which has been present since the time of his initial stroke several years ago.  He states that he works in [employer] and after walking 5 to 10 minutes his legs become weak on both sides.  He does not really describe classic claudication but states that with some rest he is able to get going again and then he has fairly similar walking distance before he experiences weakness and pain in the legs again.  Past Medical History:  Diagnosis Date  . Alcohol use   . Hypercholesteremia   . Hypertension    . Seizures (HCC)   . Stroke [facility])    TIA 12/29/20, re-admitted 01/14/21 for sequelae  . Tobacco abuse    Past Surgical History:  Procedure Laterality Date  . NO PAST SURGERIES      Family History  Problem Relation Age of Onset  . Hypertension Mother   . Other Father        unknown medical history    SOCIAL HISTORY: Social History   Socioeconomic History  . Marital status: Divorced    Spouse name: Not on file  . Number of children: 4  . Years of education: Not on file  . Highest education level: High school graduate  Occupational History  . Not on file  Tobacco Use  . Smoking status: Current Some Day Smoker    Packs/day: 0.33  . Smokeless tobacco: Never Used  Vaping Use  . Vaping Use: Never used  Substance and Sexual Activity  . Alcohol use: Yes    Alcohol/week: 9.0 standard drinks    Types: 9 Cans of beer per week    Comment: 3-4 times per week  . Drug use: No  . Sexual activity: Yes    Birth control/protection: None  Other Topics Concern  . Not on file  Social History Narrative   Lives at home with his aunt   Right handed   Drinks no caffeine   Social Determinants of Health   Financial Resource Strain: Not on file  Food Insecurity: No Food Insecurity  . Worried About Programme researcher, broadcasting/film/video in the Last Year: Never true  . Ran Out of Food in the Last Year: Never true  Transportation Needs: No Transportation Needs  . Lack of Transportation (Medical): No  . Lack of Transportation (Non-Medical): No  Physical Activity: Not on file  Stress: Not on file  Social Connections: Not on file  Intimate Partner Violence: Not on file    No Known Allergies  Current Outpatient Medications  Medication Sig Dispense Refill  . amLODipine (NORVASC) 10 MG tablet TAKE ONE TABLET BY MOUTH EVERY DAY 90 tablet 0  . aspirin 81 MG tablet Take 1 tablet (81 mg total) by mouth daily. 30 tablet 0  . atenolol (TENORMIN) 50 MG tablet Take 2 tablets (100 mg total) by mouth daily. 180  tablet 0  . atorvastatin (LIPITOR) 80 MG tablet TAKE ONE TABLET BY MOUTH AT BEDTIME 90 tablet 0  . clopidogrel (PLAVIX) 75 MG tablet TAKE ONE TABLET BY MOUTH EVERY DAY 90 tablet 0  . hydrochlorothiazide (HYDRODIURIL) 25 MG tablet TAKE ONE TABLET BY MOUTH EVERY DAY 90 tablet 0  . levETIRAcetam (KEPPRA) 1000 MG tablet Take 1 and 1/2 tablets (1500mg  total) by mouth twice daily. 270 tablet 3  . losartan (COZAAR) 100 MG tablet TAKE ONE TABLET BY MOUTH EVERY DAY 90 tablet 0  . metFORMIN (GLUCOPHAGE) 1000 MG tablet Take 0.5 tablets (500 mg total) by mouth daily with breakfast. 30 tablet 0  . omeprazole (PRILOSEC) 20 MG capsule Take 1 capsule (20 mg total) by mouth daily. 60 capsule 0   No current facility-administered medications for this visit.    ROS:   General:  No weight loss, Fever, chills  HEENT: No recent headaches, no nasal bleeding, no visual changes, no sore throat  Neurologic: No dizziness, blackouts, seizures. No recent symptoms of stroke or mini- stroke. No recent episodes of slurred speech, or temporary blindness.  Cardiac: No recent episodes of chest pain/pressure, no shortness of breath at rest.  No shortness of breath with exertion.  Denies history of atrial fibrillation or irregular heartbeat  Vascular: No history of rest pain in feet.  No history of claudication.  No history of non-healing ulcer, No history of DVT   Pulmonary: No home oxygen, no productive cough, no hemoptysis,  No asthma or wheezing  Musculoskeletal:  [ ]  Arthritis, [ ]  Low back pain,  [ ]  Joint pain  Hematologic:No history of hypercoagulable state.  No history of easy bleeding.  No history of anemia  Gastrointestinal: No hematochezia or melena,  No gastroesophageal reflux, no trouble swallowing  Urinary: [ ]  chronic Kidney disease, [ ]  on HD - [ ]  MWF or [ ]  TTHS, [ ]  Burning with urination, [ ]  Frequent urination, [ ]  Difficulty urinating;   Skin: No rashes  Psychological: No history of anxiety,  No  history of depression   Physical Examination  Vitals:   03/09/21 1038 03/09/21 1040  BP: 121/87 132/84  Pulse: 80   Resp: 20   Temp: 97.9 F (36.6 C)   SpO2: 95%   Weight: 215 lb (97.5 kg)   Height: 5\' 11"  (1.803 m)     General:  Alert and oriented, no acute distress HEENT: Normal Neck: No JVD Cardiac: Regular Rate and Rhythm Abdomen: Soft, non-tender, non-distended, no mass Skin: No rash Extremity Pulses:  2+ radial, brachial, femoral, absent popliteal dorsalis pedis, posterior tibial pulses bilaterally Musculoskeletal: No deformity or edema  Neurologic: Upper  and lower extremity motor 5/5 and symmetric  DATA:  I reviewed and interpreted the images of the patient's recent brain MRI as well as his CT angio of the head and neck.  Although it was interpreted as some narrowing of the petrous portion of the cervical carotid artery it is difficult to determine this precisely due to contrast bone similarity.  However, this is not an area approachable by standard vascular surgical techniques.  The cervical portion in the neck of the internal carotid artery has really no significant stenosis bilaterally.  ASSESSMENT: Intracranial mild to moderate carotid stenosis not amenable to vascular surgical exposure as this is within the skull.  There is really been no significant evidence to suggest that treating intracranial occlusive disease has any long-term sustained benefit.  Mainstay of therapy is medical in nature.  Of note he does have lower extremity weakness and may have an element of peripheral arterial disease as well.  We will evaluate this further.  Plan: #1 petrous portion carotid stenosis no surgical intervention continue medical management patient has follow-up with neurology best care would be Plavix aspirin and statin as you are currently doing  2.  Possible peripheral arterial disease patient will follow up in 2 to 4 weeks with bilateral ABIs for further evaluation.  Smoking  cessation was emphasized with the patient today.  Fabienne Bruns, MD [facility] Office: [phone]

## 2021-03-14 ENCOUNTER — Other Ambulatory Visit: Payer: Self-pay

## 2021-03-14 DIAGNOSIS — I739 Peripheral vascular disease, unspecified: Secondary | ICD-10-CM

## 2021-03-23 ENCOUNTER — Encounter: Payer: Self-pay | Admitting: Gerontology

## 2021-03-23 ENCOUNTER — Ambulatory Visit: Payer: Self-pay | Admitting: Gerontology

## 2021-03-23 ENCOUNTER — Other Ambulatory Visit: Payer: Self-pay

## 2021-03-23 VITALS — BP 110/74 | HR 75 | Temp 97.0°F | Ht 71.0 in | Wt 214.2 lb

## 2021-03-23 DIAGNOSIS — I10 Essential (primary) hypertension: Secondary | ICD-10-CM

## 2021-03-23 DIAGNOSIS — E785 Hyperlipidemia, unspecified: Secondary | ICD-10-CM

## 2021-03-23 DIAGNOSIS — R7303 Prediabetes: Secondary | ICD-10-CM

## 2021-03-23 DIAGNOSIS — R569 Unspecified convulsions: Secondary | ICD-10-CM

## 2021-03-23 MED ORDER — ATORVASTATIN CALCIUM 80 MG PO TABS
1.0000 | ORAL_TABLET | Freq: Every day | ORAL | 0 refills | Status: DC
Start: 2021-03-23 — End: 2021-09-07
  Filled 2021-03-23: qty 90, 90d supply, fill #0

## 2021-03-23 MED ORDER — METFORMIN HCL 1000 MG PO TABS
500.0000 mg | ORAL_TABLET | Freq: Every day | ORAL | 2 refills | Status: DC
  Filled 2021-03-23: qty 30, 60d supply, fill #0
  Filled 2021-06-01: qty 15, 30d supply, fill #1

## 2021-03-23 MED ORDER — HYDROCHLOROTHIAZIDE 25 MG PO TABS
12.5000 mg | ORAL_TABLET | Freq: Every day | ORAL | 0 refills | Status: DC
  Filled 2021-03-23: qty 30, 30d supply, fill #0
  Filled 2021-03-24: qty 60, 60d supply, fill #0
  Filled 2021-04-06: qty 15, 30d supply, fill #0
  Filled 2021-05-10: qty 15, 30d supply, fill #1

## 2021-03-23 MED ORDER — LOSARTAN POTASSIUM 100 MG PO TABS
50.0000 mg | ORAL_TABLET | Freq: Every day | ORAL | 0 refills | Status: DC
  Filled 2021-03-23: qty 30, 30d supply, fill #0
  Filled 2021-03-24: qty 60, 60d supply, fill #0
  Filled 2021-04-06: qty 15, 30d supply, fill #0
  Filled 2021-05-10: qty 15, 30d supply, fill #1

## 2021-03-23 NOTE — Progress Notes (Signed)
Established Patient Office Visit  Subjective:  Patient ID: Gary [name], male    DOB: 1971/08/05  Age: 50 y.o. MRN: [id number]  CC:  Chief Complaint  Patient presents with   Seizures    Having 2-3 seizures a day even after neurology has raised levetiracetam medication    HPI [name] is a 50 y/o male with PMH of Seizure, Alcohol use, Hypercholesterolemia, Hypertension, Stroke, presents for follow up visit and medication refill.  His HgbA1c was 6.3% and he was started on 500 mg Metformin, he reports that he's tolerating medication and denies  any side effects. He was seen at the Neurology clinic for Seizure by Dr Ala Bent on 02/27/21. His Levetiracetam was increased to 1500 mg bid and to keep a calendar of the spells. He was also seen by Vascular Surgery on 03/09/21 for Peripheral Arterial Disease (PAD) by Dr Leata Mouse. It was recommended that he continues his Statin, Plavix, Aspirin, and he will follow up for bilateral ABI in 2-4 weeks. Currently,he reports that he continues to experience multiple episodes of left facial droop with confusion, word-finding difficulties, inability to communicate that lasts 5 minutes daily at work .He also reports 1-2 episodes at home 2 times weekly but it occurs daily  at work. 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. He denies any syncopal episode, chest pain, palpitation, dizziness. Overall, he states that he's doing well and will follow up with Neurology.  Past Medical History:  Diagnosis Date   Alcohol use    Carotid artery occlusion    Hypercholesteremia    Hypertension    Seizures ([place])    Stroke ([place])    TIA 12/29/20, re-admitted 01/14/21 for sequelae   Tobacco abuse     Past Surgical History:  Procedure Laterality Date   NO PAST SURGERIES      Family History  Problem Relation Age of Onset   Hypertension Mother    Other Father        unknown medical history    Social History   Socioeconomic History   Marital status:  Divorced    Spouse name: Not on file   Number of children: 4   Years of education: Not on file   Highest education level: High school graduate  Occupational History   Not on file  Tobacco Use   Smoking status: Some Days    Packs/day: 0.33    Pack years: 0.00    Types: Cigarettes   Smokeless tobacco: Never  Vaping Use   Vaping Use: Never used  Substance and Sexual Activity   Alcohol use: Yes    Alcohol/week: 9.0 standard drinks    Types: 9 Cans of beer per week    Comment: 3-4 times per week   Drug use: No   Sexual activity: Yes    Birth control/protection: None  Other Topics Concern   Not on file  Social History Narrative   Lives at home with his aunt   Right handed   Drinks no caffeine   Social Determinants of Radio broadcast assistant Strain: Not on file  Food Insecurity: No Food Insecurity   Worried About Charity fundraiser in the Last Year: Never true   Ran Out of Food in the Last Year: Never true  Transportation Needs: No Transportation Needs   Lack of Transportation (Medical): No   Lack of Transportation (Non-Medical): No  Physical Activity: Not on file  Stress: Not on file  Social Connections: Not on file  Intimate Partner Violence: Not on file    Outpatient Medications Prior to Visit  Medication Sig Dispense Refill   amLODipine (NORVASC) 10 MG tablet TAKE ONE TABLET BY MOUTH EVERY DAY 90 tablet 0   aspirin 81 MG tablet Take 1 tablet (81 mg total) by mouth daily. 30 tablet 0   atenolol (TENORMIN) 50 MG tablet Take 2 tablets (100 mg total) by mouth daily. 180 tablet 0   clopidogrel (PLAVIX) 75 MG tablet TAKE ONE TABLET BY MOUTH EVERY DAY 90 tablet 0   omeprazole (PRILOSEC) 20 MG capsule Take 1 capsule (20 mg total) by mouth daily. 60 capsule 0   atorvastatin (LIPITOR) 80 MG tablet TAKE ONE TABLET BY MOUTH AT BEDTIME 90 tablet 0   hydrochlorothiazide (HYDRODIURIL) 25 MG tablet TAKE ONE TABLET BY MOUTH EVERY DAY 90 tablet 0   losartan (COZAAR) 100 MG  tablet TAKE ONE TABLET BY MOUTH EVERY DAY 90 tablet 0   metFORMIN (GLUCOPHAGE) 1000 MG tablet Take 0.5 tablets (500 mg total) by mouth daily with breakfast. 30 tablet 0   levETIRAcetam (KEPPRA) 1000 MG tablet Take 1 and 1/2 tablets (1534m total) by mouth twice daily. (Patient taking differently: Take 1 and 1/2 tablets (15022mtotal) by mouth twice daily.) 270 tablet 3   No facility-administered medications prior to visit.    No Known Allergies  ROS Review of Systems  Constitutional: Negative.   Respiratory: Negative.    Cardiovascular: Negative.   Neurological:  Positive for seizures. Negative for syncope and speech difficulty.  Hematological: Negative.      Objective:    Physical Exam HENT:     Head: Normocephalic and atraumatic.  Cardiovascular:     Rate and Rhythm: Normal rate and regular rhythm.  Pulmonary:     Effort: Pulmonary effort is normal.     Breath sounds: Normal breath sounds.  Skin:    General: Skin is warm.  Neurological:     General: No focal deficit present.     Mental Status: He is alert and oriented to person, place, and time. Mental status is at baseline.  Psychiatric:        Mood and Affect: Mood normal.        Behavior: Behavior normal.        Thought Content: Thought content normal.        Judgment: Judgment normal.    BP 110/74 (BP Location: Left Arm, Patient Position: Sitting, Cuff Size: Large)   Pulse 75   Temp (!) 97 F (36.1 C)   Ht _0  (1.803 m)   Wt 214 lb 3.2 oz (97.2 kg)   SpO2 96%   BMI 29.87 kg/m  Wt Readings from Last 3 Encounters:  03/23/21 214 lb 3.2 oz (97.2 kg)  03/09/21 215 lb (97.5 kg)  02/27/21 216 lb 3.2 oz (98.1 kg)     Health Maintenance Due  Topic Date Due   PNEUMOCOCCAL POLYSACCHARIDE VACCINE AGE 28-64 HIGH RISK  Never done   COVID-19 Vaccine (1) Never done   Pneumococcal Vaccine 0-45474ears old (1 - PCV) Never done   FOOT EXAM  Never done   OPHTHALMOLOGY EXAM  Never done   Hepatitis C Screening  Never  done   TETANUS/TDAP  Never done   COLONOSCOPY (Pts 45-4958yrnsurance coverage will need to be confirmed)  Never done    There are no preventive care reminders to display for this patient.  Lab Results  Component Value Date   TSH 1.400 02/02/2021  Lab Results  Component Value Date   WBC 8.9 02/02/2021   HGB 12.2 (L) 02/02/2021   HCT 35.5 (L) 02/02/2021   MCV 95 02/02/2021   PLT 302 02/02/2021   Lab Results  Component Value Date   NA 139 02/02/2021   K 5.2 02/02/2021   CO2 26 02/02/2021   GLUCOSE 100 (H) 02/02/2021   BUN 30 (H) 02/02/2021   CREATININE 1.36 (H) 02/02/2021   BILITOT 0.3 02/02/2021   ALKPHOS 155 (H) 02/02/2021   AST 75 (H) 02/02/2021   ALT 84 (H) 02/02/2021   PROT 7.5 02/02/2021   ALBUMIN 4.5 02/02/2021   CALCIUM 10.3 (H) 02/02/2021   ANIONGAP 10 01/14/2021   EGFR 64 02/02/2021   Lab Results  Component Value Date   CHOL 196 02/02/2021   Lab Results  Component Value Date   HDL 41 02/02/2021   Lab Results  Component Value Date   LDLCALC 122 (H) 02/02/2021   Lab Results  Component Value Date   TRIG 184 (H) 02/02/2021   Lab Results  Component Value Date   CHOLHDL 6.3 12/29/2020   Lab Results  Component Value Date   HGBA1C 6.3 (H) 02/02/2021      Assessment & Plan:   1. Essential hypertension -His blood pressure is under control, Losartan and HCTZ was decreased, he was advised to check, record his blood pressure and bring log to follow up appointment. He was encouraged to continue on DASH diet and exercise as tolerated. - losartan (COZAAR) 50 MG tablet; Take 1 tablet (50 mg total) by mouth daily.  Dispense: 30 tablet; Refill: 0 - hydrochlorothiazide (MICROZIDE) 12.5 MG capsule; Take 1 capsule (12.5 mg total) by mouth daily.  Dispense: 30 capsule; Refill: 0 - Comp Met (CMET); Future  2. Prediabetes - He will continue on current medication, low carb/non concentrated sweet diet and exercise as tolerated. - metFORMIN (GLUCOPHAGE) 500 MG  tablet; Take 1 tablet (500 mg total) by mouth daily with breakfast.  Dispense: 90 tablet; Refill: 0  3. Hyperlipidemia, unspecified hyperlipidemia type -He will continue on current medication, low fat/cholesterol diet. - atorvastatin (LIPITOR) 80 MG tablet; TAKE ONE TABLET BY MOUTH AT BEDTIME  Dispense: 90 tablet; Refill: 0  4. Seizures ([place]) -He will continue on current medication, was advised to notify Neurology Dr Aquino's clinic with regards to his multiple daily seizure episodes. He was also advised to keep calendar of seizure activities and go to the ED for worsening symptoms. - Levetiracetam level; Future    Follow-up: Return in about 4 weeks (around 04/20/2021), or if symptoms worsen or fail to improve.    Alyia Lacerte Jerold Coombe, NP

## 2021-03-24 ENCOUNTER — Other Ambulatory Visit: Payer: Self-pay

## 2021-04-06 ENCOUNTER — Ambulatory Visit (HOSPITAL_COMMUNITY)
Admission: RE | Admit: 2021-04-06 | Discharge: 2021-04-06 | Disposition: A | Discharge status: Home / Self Care | Payer: Self-pay | Source: Ambulatory Visit | Attending: Vascular Surgery | Admitting: Vascular Surgery

## 2021-04-06 ENCOUNTER — Other Ambulatory Visit: Payer: Self-pay | Admitting: Gerontology

## 2021-04-06 ENCOUNTER — Encounter: Payer: Self-pay | Admitting: Vascular Surgery

## 2021-04-06 ENCOUNTER — Ambulatory Visit (INDEPENDENT_AMBULATORY_CARE_PROVIDER_SITE_OTHER): Payer: Self-pay | Admitting: Vascular Surgery

## 2021-04-06 ENCOUNTER — Other Ambulatory Visit: Payer: Self-pay

## 2021-04-06 VITALS — BP 126/84 | HR 78 | Temp 97.9°F | Resp 20 | Ht 71.0 in | Wt 212.0 lb

## 2021-04-06 DIAGNOSIS — K219 Gastro-esophageal reflux disease without esophagitis: Secondary | ICD-10-CM

## 2021-04-06 DIAGNOSIS — I739 Peripheral vascular disease, unspecified: Secondary | ICD-10-CM | POA: Insufficient documentation

## 2021-04-06 NOTE — Progress Notes (Signed)
Patient is a 50 year old male who returns for follow-up today.  He was last seen June 2 for evaluation of intracranial carotid artery stenosis.  This was a mild to moderate stenosis and I recommended medical therapy.  At the time of his office visit he also complained of lower extremity weakness when performing his job at [employer].  He returns today after noninvasive arterial exam to evaluate this.  Both of his legs become weak after walking 5 to 10 minutes and then recover after a few minutes.  He has fairly similar walking distance that elicits pain each time.  He currently is smoking about a third a pack of cigarettes per day.  We discussed quitting today.  He also continues to struggle with seizures.  He sometimes has as many as 2-3 a day.  He is followed by Dr. Karel Jarvis with neurology.  Past Medical History:  Diagnosis Date   Alcohol use    Carotid artery occlusion    Hypercholesteremia    Hypertension    Seizures ([facility])    Stroke ([facility])    TIA 12/29/20, re-admitted 01/14/21 for sequelae   Tobacco abuse     Past Surgical History:  Procedure Laterality Date   NO PAST SURGERIES     Current Outpatient Medications on File Prior to Visit  Medication Sig Dispense Refill   amLODipine (NORVASC) 10 MG tablet TAKE ONE TABLET BY MOUTH EVERY DAY 90 tablet 0   aspirin 81 MG tablet Take 1 tablet (81 mg total) by mouth daily. 30 tablet 0   atenolol (TENORMIN) 50 MG tablet Take 2 tablets (100 mg total) by mouth daily. 180 tablet 0   atorvastatin (LIPITOR) 80 MG tablet TAKE ONE TABLET BY MOUTH AT BEDTIME 90 tablet 0   clopidogrel (PLAVIX) 75 MG tablet TAKE ONE TABLET BY MOUTH EVERY DAY 90 tablet 0   hydrochlorothiazide (MICROZIDE) 12.5 MG capsule Take 1 capsule (12.5 mg total) by mouth daily. 60 capsule 0   levETIRAcetam (KEPPRA) 1000 MG tablet Take 1 and 1/2 tablets (1500mg  total) by mouth twice daily. (Patient taking differently: Take 1 and 1/2 tablets (1500mg  total) by mouth twice daily.) 270 tablet 3    losartan (COZAAR) 50 MG tablet Take 1 tablet (50 mg total) by mouth daily. 60 tablet 0   metFORMIN (GLUCOPHAGE) 1000 MG tablet Take (1/2) tablet (500 mg total) by mouth daily with breakfast. 30 tablet 2   omeprazole (PRILOSEC) 20 MG capsule Take 1 capsule (20 mg total) by mouth daily. 60 capsule 0   No current facility-administered medications on file prior to visit.    Social History   Socioeconomic History   Marital status: Divorced    Spouse name: Not on file   Number of children: 4   Years of education: Not on file   Highest education level: High school graduate  Occupational History   Not on file  Tobacco Use   Smoking status: Some Days    Packs/day: 0.33    Pack years: 0.00    Types: Cigarettes   Smokeless tobacco: Never  Vaping Use   Vaping Use: Never used  Substance and Sexual Activity   Alcohol use: Yes    Alcohol/week: 9.0 standard drinks    Types: 9 Cans of beer per week    Comment: 3-4 times per week   Drug use: No   Sexual activity: Yes    Birth control/protection: None  Other Topics Concern   Not on file  Social History Narrative   Lives  at home with his aunt   Right handed   Drinks no caffeine   Social Determinants of Corporate investment banker Strain: Not on file  Food Insecurity: No Food Insecurity   Worried About Programme researcher, broadcasting/film/video in the Last Year: Never true   Barista in the Last Year: Never true  Transportation Needs: No Transportation Needs   Lack of Transportation (Medical): No   Lack of Transportation (Non-Medical): No  Physical Activity: Not on file  Stress: Not on file  Social Connections: Not on file  Intimate Partner Violence: Not on file    Physical exam:  Vitals:   04/06/21 1115  BP: 126/84  Pulse: 78  Resp: 20  Temp: 97.9 F (36.6 C)  SpO2: 96%  Weight: 212 lb (96.2 kg)  Height: 5\' 11"  (1.803 m)    Extremities: Absent pedal pulses bilaterally  Neuro: Symmetric upper extremity lower extremity motor  strength no facial asymmetry  Data: Patient had bilateral ABIs performed today which were 0.6 bilaterally  Assessment: 1 intracranial carotid artery occlusive disease and medical management  2.  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  Discussed with patient today smoking cessation and walking program of 30 minutes daily.  We would consider doing an arteriogram and possible intervention if he feels his symptoms are more debilitating.  However, we would need to have his seizures under control prior to proceeding with this.  In light of this we will try to get him back into see Dr. as soon as possible to see if they can get better control over his seizure since he is currently having sometimes as many as 2 to 3/day.  I discussed with the patient that if he is able to quit smoking his lifetime risk of limb loss is less than 5%.  I also discussed with him that walking 30 minutes daily and smoking cessation should improve his walking distance.  The patient will follow up with Karel Jarvis in 3 months time with bilateral ABIs and bilateral lower extremity duplex exam.  [place], MD [facility] Office: [phone]

## 2021-04-07 ENCOUNTER — Other Ambulatory Visit: Payer: Self-pay

## 2021-04-07 DIAGNOSIS — I739 Peripheral vascular disease, unspecified: Secondary | ICD-10-CM

## 2021-04-11 ENCOUNTER — Other Ambulatory Visit: Payer: Self-pay

## 2021-04-11 MED ORDER — OMEPRAZOLE 20 MG PO CPDR
20.0000 mg | DELAYED_RELEASE_CAPSULE | Freq: Every day | ORAL | 0 refills | Status: DC
  Filled 2021-04-11: qty 60, 60d supply, fill #0

## 2021-04-13 ENCOUNTER — Other Ambulatory Visit: Payer: Self-pay

## 2021-04-13 DIAGNOSIS — I10 Essential (primary) hypertension: Secondary | ICD-10-CM

## 2021-04-13 DIAGNOSIS — R569 Unspecified convulsions: Secondary | ICD-10-CM

## 2021-04-13 DIAGNOSIS — I739 Peripheral vascular disease, unspecified: Secondary | ICD-10-CM

## 2021-04-20 ENCOUNTER — Encounter: Payer: Self-pay | Admitting: Gerontology

## 2021-04-20 ENCOUNTER — Ambulatory Visit: Payer: Self-pay | Admitting: Pharmacist

## 2021-04-20 ENCOUNTER — Other Ambulatory Visit: Payer: Self-pay

## 2021-04-20 ENCOUNTER — Ambulatory Visit: Payer: Self-pay | Admitting: Gerontology

## 2021-04-20 ENCOUNTER — Encounter: Payer: Self-pay | Admitting: Pharmacist

## 2021-04-20 VITALS — BP 134/82 | HR 83 | Temp 97.2°F | Resp 18 | Ht 71.0 in | Wt 213.1 lb

## 2021-04-20 DIAGNOSIS — Z720 Tobacco use: Secondary | ICD-10-CM

## 2021-04-20 DIAGNOSIS — R7989 Other specified abnormal findings of blood chemistry: Secondary | ICD-10-CM | POA: Insufficient documentation

## 2021-04-20 DIAGNOSIS — R569 Unspecified convulsions: Secondary | ICD-10-CM

## 2021-04-20 DIAGNOSIS — Z79899 Other long term (current) drug therapy: Secondary | ICD-10-CM

## 2021-04-20 NOTE — Progress Notes (Addendum)
Medication Management Clinic Visit Note  Patient: Gary [name] MRN: [id number] Date of Birth: Apr 19, 1971 PCP: Regino Bellow, NP   [name] 50 y.o. male presents for a medication management visit today via telephone. Patient was identified by name and date of birth.  There were no vitals taken for this visit.  Patient Information   Past Medical History:  Diagnosis Date   Alcohol use    Carotid artery occlusion    GERD (gastroesophageal reflux disease)    Hypercholesteremia    Hypertension    Seizures ([facility])    Stroke ([facility])    TIA 12/29/20, re-admitted 01/14/21 for sequelae   Tobacco abuse       Past Surgical History:  Procedure Laterality Date   NO PAST SURGERIES       Family History  Problem Relation Age of Onset   Hypertension Mother        pt reports mother has stent   Other Father        unknown medical history    New Diagnoses (since last visit):   Lifestyle Diet and exercise were not evaluated at today's visit. Pt does endorse he has thoughts about smoking cessation but is not ready to quit yet. He can verbalize the importance of smoking cessation and what it will do for his health.       Health Maintenance/Date Completed  Last ED visit: 01/14/2021 Last Visit to PCP: 04/20/21 Next Visit to PCP: 06/06/2021 Specialist Visit: Last visit was 04/07/21 - Vascular Dr. Fabienne Bruns  Dental Exam: per pt had a dental appt last year  Eye Exam: Waiting for an appointment per patient  Prostate Exam: never  Colonoscopy: never  Flu Vaccine: -  Pneumonia Vaccine: never  COVID-19 Vaccine: Pt states he has had 2 doses of Pfizer  Shingrix Vaccine: n/a        Social History   Substance and Sexual Activity  Alcohol Use Yes   Alcohol/week: 9.0 standard drinks   Types: 9 Standard drinks or equivalent per week   Comment: 3-4 times per week      Social History   Tobacco Use  Smoking Status Every Day   Packs/day: 0.20   Years: 32.00   Pack years:  6.40   Types: Cigarettes  Smokeless Tobacco Never  Tobacco Comments   Pt reports he has thoughts of quitting.       Health Maintenance  Topic Date Due   PNEUMOCOCCAL POLYSACCHARIDE VACCINE AGE 89-64 HIGH RISK  Never done   COVID-19 Vaccine (1) Never done   Pneumococcal Vaccine 24-76 Years old (1 - PCV) Never done   FOOT EXAM  Never done   OPHTHALMOLOGY EXAM  Never done   Hepatitis C Screening  Never done   TETANUS/TDAP  Never done   COLONOSCOPY (Pts 45-31yrs Insurance coverage will need to be confirmed)  Never done   INFLUENZA VACCINE  05/08/2021   HEMOGLOBIN A1C  08/04/2021   HIV Screening  Completed   HPV VACCINES  Aged Out   Outpatient Encounter Medications as of 04/20/2021  Medication Sig   amLODipine (NORVASC) 10 MG tablet TAKE ONE TABLET BY MOUTH EVERY DAY   aspirin 81 MG tablet Take 1 tablet (81 mg total) by mouth daily.   atenolol (TENORMIN) 50 MG tablet TAKE 2 TABLETS (100MG  TOTAL) BY MOUTH ONCE DAILY.   atorvastatin (LIPITOR) 80 MG tablet TAKE ONE TABLET BY MOUTH AT BEDTIME   clopidogrel (PLAVIX) 75 MG tablet TAKE ONE TABLET BY MOUTH EVERY DAY  hydrochlorothiazide (HYDRODIURIL) 25 MG tablet TAKE (1/2) TABLET (12.5MG  TOTAL) BY MOUTH ONCE DAILY.   levETIRAcetam (KEPPRA) 1000 MG tablet Take 1 and 1/2 tablets (1500mg  total) by mouth twice daily. (Patient taking differently: Take 1 and 1/2 tablets (1500mg  total) by mouth twice daily.)   losartan (COZAAR) 100 MG tablet TAKE (1/2) TABLET (50MG  TOTAL) BY MOUTH ONCE DAILY.   metFORMIN (GLUCOPHAGE) 1000 MG tablet Take (1/2) tablet (500 mg total) by mouth daily with breakfast.   omeprazole (PRILOSEC) 20 MG capsule Take 1 capsule (20 mg total) by mouth daily.   [DISCONTINUED] atorvastatin (LIPITOR) 80 MG tablet TAKE ONE TABLET BY MOUTH AT BEDTIME   [DISCONTINUED] hydrochlorothiazide (HYDRODIURIL) 25 MG tablet TAKE ONE TABLET BY MOUTH EVERY DAY   [DISCONTINUED] losartan (COZAAR) 100 MG tablet TAKE ONE TABLET BY MOUTH EVERY DAY    [DISCONTINUED] metFORMIN (GLUCOPHAGE) 1000 MG tablet Take 0.5 tablets (500 mg total) by mouth daily with breakfast.   [DISCONTINUED] omeprazole (PRILOSEC) 20 MG capsule Take 1 capsule (20 mg total) by mouth daily.   No facility-administered encounter medications on file as of 04/20/2021.     Assessment and Plan:  Medication Management/ Adherence Coaching:  Pt endorses he is adherent with medications and is able to verbalize medication regimen with no coaching. At today's visit indications for medications were reinforced. Pt was commended on great adherence. Medication Management Clinic is assisting pt's medication needs at this time.  Post-Stroke Epilepsy:  Per Dr. on 04/06/21 pt reports having seizures ~ 2- 3 times/day. Pt is currently on Levetiracetam 1500 mg BID. Keppra level was performed on 04/13/21 but never released. Pt is being followed by Neurology with Dr. Darrick Penna.   Stroke Prevention:  Currently on DAPT: Plavix 75 mg daily, Aspirin 81 mg daily. Reviewed the indications of these medications. Pt and I briefly discussed the importance of DAPT in someone with stroke history and the importance of preventing blood clots. (Stroke 2017, 12/29/20).  Hyperlipidemia:  Last lipid panel on 02/02/21: TG 184 mg/dL and LDLs 2018 mg/dL elevated. Pt currently on high-intensity statin - Atorvastatin 80 mg.   PAD: Per note on 03/09/21, pt has weakness and pain in lower extremities after walking 5-10 minutes. Pt is followed by Dr. 02/04/21 who recommends smoking cessation and walking 30 minutes daily.  Carotid stenosis: Per Dr. 846 note from 03/09/21, surgery not recommend at this time. Currently on clopidogrel, aspirin and atorvastatin. Pt is followed by Dr. Fabienne Bruns   Hypertension:   Longstanding currently controlled. Pt reports home BP readings range 120s/70s mmHg. Current regimen consists of Amlodipine, Atenolol, HCTZ, and Losartan. Both HCTZ and Losartan dosages were decreased  by half at PCP visit on 03/23/21. Continue to monitor BP at home.   Pre-Diabetes:  Pt is currently on Metformin 500 mg daily. Last A1c 6.3%. Pt does not check BG at home. Pt followed by 05/09/21, NP.   Follow Up: 6 months  M. Fabienne Bruns Philbert Ocallaghan, 03/25/21 Pharm D Candidate 2023  [employer]   Cosigned: [place]. 2024, PharmD [facility] Clinic-Pharmacy Operations Coordinator [phone]

## 2021-04-20 NOTE — Progress Notes (Signed)
Established Patient Office Visit  Subjective:  Patient ID: Gary [name], male    DOB: 07/02/1971  Age: 50 y.o. MRN: [id number]  CC:  Chief Complaint  Patient presents with   Follow-up    Labs drawn 04/18/21   Hypertension    HPI Darshan Solanki is a 50 y/o male with PMH of Seizure, Alcohol use, Hypercholesterolemia, Hypertension, Stroke, presents for follow up visit and medication refill.presents for routine office visit and lab review. He was seen at [facility] by Dr Leata Mouse for PAD on 04/06/21.  He will follow-up with bilateral ABIs and bilateral lower extremity duplex exam.  He continues to smoke 3-4 cigarettes daily and admits the desire to quit. His Levetiracetam level done on 04/13/21 was not released, and his serum creat was 1.52 mg/dl and eGFR was 56.  He states that he is compliant with his medications, and denies seizure activity.  Overall he states that he is doing well and offers no further complaint.  Past Medical History:  Diagnosis Date   Alcohol use    Carotid artery occlusion    GERD (gastroesophageal reflux disease)    Hypercholesteremia    Hypertension    Seizures (HCC)    Stroke ([place])    TIA 12/29/20, re-admitted 01/14/21 for sequelae   Tobacco abuse     Past Surgical History:  Procedure Laterality Date   NO PAST SURGERIES      Family History  Problem Relation Age of Onset   Hypertension Mother        pt reports mother has stent   Other Father        unknown medical history    Social History   Socioeconomic History   Marital status: Divorced    Spouse name: Not on file   Number of children: 4   Years of education: Not on file   Highest education level: High school graduate  Occupational History   Not on file  Tobacco Use   Smoking status: Every Day    Packs/day: 0.20    Years: 32.00    Pack years: 6.40    Types: Cigarettes   Smokeless tobacco: Never   Tobacco comments:    Pt reports he has thoughts of quitting.   Vaping Use    Vaping Use: Never used  Substance and Sexual Activity   Alcohol use: Yes    Alcohol/week: 9.0 standard drinks    Types: 9 Standard drinks or equivalent per week    Comment: 3-4 times per week   Drug use: No   Sexual activity: Yes    Birth control/protection: None  Other Topics Concern   Not on file  Social History Narrative   Lives at home with his aunt   Right handed   Drinks no caffeine   Social Determinants of Radio broadcast assistant Strain: Not on file  Food Insecurity: No Food Insecurity   Worried About Charity fundraiser in the Last Year: Never true   Ran Out of Food in the Last Year: Never true  Transportation Needs: No Transportation Needs   Lack of Transportation (Medical): No   Lack of Transportation (Non-Medical): No  Physical Activity: Not on file  Stress: Not on file  Social Connections: Not on file  Intimate Partner Violence: Not on file    Outpatient Medications Prior to Visit  Medication Sig Dispense Refill   amLODipine (NORVASC) 10 MG tablet TAKE ONE TABLET BY MOUTH EVERY DAY 90 tablet 0   aspirin  81 MG tablet Take 1 tablet (81 mg total) by mouth daily. 30 tablet 0   atenolol (TENORMIN) 50 MG tablet TAKE 2 TABLETS (100MG TOTAL) BY MOUTH ONCE DAILY. 180 tablet 0   atorvastatin (LIPITOR) 80 MG tablet TAKE ONE TABLET BY MOUTH AT BEDTIME 90 tablet 0   clopidogrel (PLAVIX) 75 MG tablet TAKE ONE TABLET BY MOUTH EVERY DAY 90 tablet 0   hydrochlorothiazide (HYDRODIURIL) 25 MG tablet TAKE (1/2) TABLET (12.5MG TOTAL) BY MOUTH ONCE DAILY. 30 tablet 0   levETIRAcetam (KEPPRA) 1000 MG tablet Take 1 and 1/2 tablets (1536m total) by mouth twice daily. (Patient taking differently: Take 1 and 1/2 tablets (15032mtotal) by mouth twice daily.) 270 tablet 3   losartan (COZAAR) 100 MG tablet TAKE (1/2) TABLET (50MG TOTAL) BY MOUTH ONCE DAILY. 30 tablet 0   metFORMIN (GLUCOPHAGE) 1000 MG tablet Take (1/2) tablet (500 mg total) by mouth daily with breakfast. 30 tablet 2    omeprazole (PRILOSEC) 20 MG capsule Take 1 capsule (20 mg total) by mouth daily. 60 capsule 0   No facility-administered medications prior to visit.    No Known Allergies  ROS Review of Systems  Constitutional: Negative.   Respiratory: Negative.    Cardiovascular: Negative.   Neurological: Negative.   Psychiatric/Behavioral: Negative.       Objective:    Physical Exam HENT:     Head: Normocephalic and atraumatic.  Cardiovascular:     Rate and Rhythm: Normal rate and regular rhythm.     Pulses: Normal pulses.     Heart sounds: Normal heart sounds.  Pulmonary:     Effort: Pulmonary effort is normal.     Breath sounds: Normal breath sounds.  Neurological:     General: No focal deficit present.     Mental Status: He is alert and oriented to person, place, and time. Mental status is at baseline.  Psychiatric:        Mood and Affect: Mood normal.        Behavior: Behavior normal.        Thought Content: Thought content normal.        Judgment: Judgment normal.    BP 134/82 (BP Location: Right Arm, Patient Position: Sitting, Cuff Size: Large)   Pulse 83   Temp (!) 97.2 F (36.2 C)   Resp 18   Ht _0  (1.803 m)   Wt 213 lb 1.6 oz (96.7 kg)   SpO2 98%   BMI 29.72 kg/m  Wt Readings from Last 3 Encounters:  04/20/21 213 lb 1.6 oz (96.7 kg)  04/06/21 212 lb (96.2 kg)  03/23/21 214 lb 3.2 oz (97.2 kg)     Health Maintenance Due  Topic Date Due   PNEUMOCOCCAL POLYSACCHARIDE VACCINE AGE 82-64 HIGH RISK  Never done   COVID-19 Vaccine (1) Never done   Pneumococcal Vaccine 0-59418ears old (1 - PCV) Never done   FOOT EXAM  Never done   OPHTHALMOLOGY EXAM  Never done   Hepatitis C Screening  Never done   TETANUS/TDAP  Never done   COLONOSCOPY (Pts 45-497yrnsurance coverage will need to be confirmed)  Never done    There are no preventive care reminders to display for this patient.  Lab Results  Component Value Date   TSH 1.400 02/02/2021   Lab Results   Component Value Date   WBC 8.9 02/02/2021   HGB 12.2 (L) 02/02/2021   HCT 35.5 (L) 02/02/2021   MCV 95 02/02/2021   PLT  302 02/02/2021   Lab Results  Component Value Date   NA 140 04/13/2021   K 4.0 04/13/2021   CO2 20 04/13/2021   GLUCOSE 100 (H) 04/13/2021   BUN 27 (H) 04/13/2021   CREATININE 1.52 (H) 04/13/2021   BILITOT <0.2 04/13/2021   ALKPHOS 88 04/13/2021   AST 29 04/13/2021   ALT 33 04/13/2021   PROT 7.3 04/13/2021   ALBUMIN 4.5 04/13/2021   CALCIUM 9.7 04/13/2021   ANIONGAP 10 01/14/2021   EGFR 56 (L) 04/13/2021   Lab Results  Component Value Date   CHOL 196 02/02/2021   Lab Results  Component Value Date   HDL 41 02/02/2021   Lab Results  Component Value Date   LDLCALC 122 (H) 02/02/2021   Lab Results  Component Value Date   TRIG 184 (H) 02/02/2021   Lab Results  Component Value Date   CHOLHDL 6.3 12/29/2020   Lab Results  Component Value Date   HGBA1C 6.3 (H) 02/02/2021      Assessment & Plan:    1. Seizures ([place]) -He denies seizure activities since hospital discharge, will recheck Keppra levels in few weeks.  He was advised to notify his neurologist or go to the emergency room with any seizure activity. - Levetiracetam level; Future  2. Elevated serum creatinine -His serum creatinine was 1.52, he was advised to increase his water intake and will recheck in few weeks and if still elevated might consider discontinuing hydrochlorothiazide. - Basic Metabolic Panel (BMET); Future  3. Tobacco abuse -He was strongly encouraged on smoking cessation, was provided with [place] quit line information.    Follow-up: Return in about 7 weeks (around 06/06/2021), or if symptoms worsen or fail to improve.    Yoona Ishii Jerold Coombe, NP

## 2021-04-22 LAB — COMPREHENSIVE METABOLIC PANEL
ALT: 33 IU/L (ref 0–44)
AST: 29 IU/L (ref 0–40)
Albumin/Globulin Ratio: 1.6 (ref 1.2–2.2)
Albumin: 4.5 g/dL (ref 4.0–5.0)
Alkaline Phosphatase: 88 IU/L (ref 44–121)
BUN/Creatinine Ratio: 18 (ref 9–20)
BUN: 27 mg/dL — ABNORMAL HIGH (ref 6–24)
Bilirubin Total: <0.2 mg/dL (ref 0.0–1.2)
CO2: 20 mmol/L (ref 20–29)
Calcium: 9.7 mg/dL (ref 8.7–10.2)
Chloride: 101 mmol/L (ref 96–106)
Creatinine, Ser: 1.52 mg/dL — ABNORMAL HIGH (ref 0.76–1.27)
Globulin, Total: 2.8 g/dL (ref 1.5–4.5)
Glucose: 100 mg/dL — ABNORMAL HIGH (ref 65–99)
Potassium: 4 mmol/L (ref 3.5–5.2)
Sodium: 140 mmol/L (ref 134–144)
Total Protein: 7.3 g/dL (ref 6.0–8.5)
eGFR: 56 mL/min/{1.73_m2} — ABNORMAL LOW (ref >59)

## 2021-04-22 LAB — LEVETIRACETAM LEVEL: Levetiracetam Lvl: 32 ug/mL (ref 10.0–40.0)

## 2021-05-10 ENCOUNTER — Other Ambulatory Visit: Payer: Self-pay | Admitting: Gerontology

## 2021-05-10 ENCOUNTER — Other Ambulatory Visit: Payer: Self-pay

## 2021-05-10 DIAGNOSIS — I639 Cerebral infarction, unspecified: Secondary | ICD-10-CM

## 2021-05-10 DIAGNOSIS — I10 Essential (primary) hypertension: Secondary | ICD-10-CM

## 2021-05-10 MED FILL — Atenolol Tab 50 MG: ORAL | 90 days supply | Qty: 180 | Fill #0 | Status: AC

## 2021-05-10 MED FILL — Clopidogrel Bisulfate Tab 75 MG (Base Equiv): ORAL | 90 days supply | Qty: 90 | Fill #0 | Status: AC

## 2021-05-10 MED FILL — Amlodipine Besylate Tab 10 MG (Base Equivalent): ORAL | 90 days supply | Qty: 90 | Fill #0 | Status: AC

## 2021-05-12 ENCOUNTER — Ambulatory Visit: Payer: Self-pay | Admitting: Neurology

## 2021-05-25 ENCOUNTER — Other Ambulatory Visit: Payer: Self-pay

## 2021-05-25 DIAGNOSIS — R569 Unspecified convulsions: Secondary | ICD-10-CM

## 2021-05-25 DIAGNOSIS — R7989 Other specified abnormal findings of blood chemistry: Secondary | ICD-10-CM

## 2021-05-30 LAB — BASIC METABOLIC PANEL
BUN/Creatinine Ratio: 16 (ref 9–20)
BUN: 26 mg/dL — ABNORMAL HIGH (ref 6–24)
CO2: 19 mmol/L — ABNORMAL LOW (ref 20–29)
Calcium: 9.8 mg/dL (ref 8.7–10.2)
Chloride: 100 mmol/L (ref 96–106)
Creatinine, Ser: 1.58 mg/dL — ABNORMAL HIGH (ref 0.76–1.27)
Glucose: 133 mg/dL — ABNORMAL HIGH (ref 65–99)
Potassium: 4.2 mmol/L (ref 3.5–5.2)
Sodium: 139 mmol/L (ref 134–144)
eGFR: 53 mL/min/{1.73_m2} — ABNORMAL LOW (ref >59)

## 2021-05-30 LAB — LEVETIRACETAM LEVEL: Levetiracetam Lvl: 39 ug/mL (ref 10.0–40.0)

## 2021-06-01 ENCOUNTER — Ambulatory Visit: Payer: Self-pay | Admitting: Adult Health

## 2021-06-01 ENCOUNTER — Encounter: Payer: Self-pay | Admitting: Gerontology

## 2021-06-01 ENCOUNTER — Other Ambulatory Visit: Payer: Self-pay

## 2021-06-01 VITALS — BP 147/79 | Temp 97.6°F | Resp 16 | Ht 71.0 in | Wt 223.6 lb

## 2021-06-01 DIAGNOSIS — K219 Gastro-esophageal reflux disease without esophagitis: Secondary | ICD-10-CM

## 2021-06-01 DIAGNOSIS — Z720 Tobacco use: Secondary | ICD-10-CM

## 2021-06-01 DIAGNOSIS — R569 Unspecified convulsions: Secondary | ICD-10-CM

## 2021-06-01 DIAGNOSIS — F1010 Alcohol abuse, uncomplicated: Secondary | ICD-10-CM

## 2021-06-01 DIAGNOSIS — I10 Essential (primary) hypertension: Secondary | ICD-10-CM

## 2021-06-01 MED ORDER — OMEPRAZOLE 20 MG PO CPDR
20.0000 mg | DELAYED_RELEASE_CAPSULE | Freq: Every day | ORAL | 0 refills | Status: DC
  Filled 2021-06-01: qty 90, 90d supply, fill #0
  Filled 2021-06-01: qty 30, 30d supply, fill #0
  Filled 2021-06-27: qty 30, 30d supply, fill #1
  Filled 2021-07-21: qty 30, 30d supply, fill #2

## 2021-06-01 MED ORDER — HYDROCHLOROTHIAZIDE 25 MG PO TABS
12.5000 mg | ORAL_TABLET | Freq: Every day | ORAL | 2 refills | Status: DC
Start: 2021-06-01 — End: 2021-09-07
  Filled 2021-06-01: qty 90, 180d supply, fill #0

## 2021-06-01 MED ORDER — LOSARTAN POTASSIUM 100 MG PO TABS
100.0000 mg | ORAL_TABLET | Freq: Every evening | ORAL | 2 refills | Status: DC
  Filled 2021-06-01 (×2): qty 90, 90d supply, fill #0
  Filled 2021-10-10: qty 90, 90d supply, fill #1
  Filled 2022-02-22: qty 90, 90d supply, fill #2

## 2021-06-01 NOTE — Progress Notes (Unsigned)
Patient: Gary [name] Male    DOB: December 25, 1970   50 y.o.   MRN: [id number] Visit Date: 06/01/2021  Today's Provider: Shawn Route, NP   Chief Complaint  Patient presents with   Follow-up    Labs drawn 05/25/21   Seizures    Patient had a seizure on 05/25/21. Patient is taking medication as prescribed.   Hypertension    Patient is not consistently checking his blood pressure at home. Patient ran out of HCTZ 2 days ago   Headache    Patient c/o of headaches every morning over the past week. Patient takes Ibuprofen for the pain with some relief.   Subjective:    HPI  50 y/o AA male presenting for f/u hypertension, seizures and headaches. State sthat headaches are new and started last week. Occur mostly in the morning when he wakes up. Took ibuprofen with moderate relief. He does not check his BP routinely but he does have a BP machine. Still smoking and drinking about three drinks a day and smokes about 3-5 cigarettes.  Had a seizure last Thursday; more of a focal seizure. State that his mouth stiff and he was drooling. He was alone when it happened. Denies falling. States that he used to have gradmal sezires but after he had his 4th stroke, he developed more focal seizures. Last seizure lasted about 10 minutes. He used to have them every day.  No Known Allergies Previous Medications   AMLODIPINE (NORVASC) 10 MG TABLET    TAKE ONE TABLET BY MOUTH EVERY DAY   ASPIRIN 81 MG TABLET    Take 1 tablet (81 mg total) by mouth daily.   ATENOLOL (TENORMIN) 50 MG TABLET    TAKE 2 TABLETS (100MG  TOTAL) BY MOUTH ONCE DAILY.   ATORVASTATIN (LIPITOR) 80 MG TABLET    TAKE ONE TABLET BY MOUTH AT BEDTIME   CLOPIDOGREL (PLAVIX) 75 MG TABLET    TAKE ONE TABLET BY MOUTH EVERY DAY   HYDROCHLOROTHIAZIDE (HYDRODIURIL) 25 MG TABLET    TAKE (1/2) TABLET (12.5MG  TOTAL) BY MOUTH ONCE DAILY.   LEVETIRACETAM (KEPPRA) 1000 MG TABLET    Take 1 and 1/2 tablets (1500mg  total) by mouth twice daily.   LOSARTAN  (COZAAR) 100 MG TABLET    TAKE (1/2) TABLET (50MG  TOTAL) BY MOUTH ONCE DAILY.   METFORMIN (GLUCOPHAGE) 1000 MG TABLET    Take (1/2) tablet (500 mg total) by mouth daily with breakfast.   OMEPRAZOLE (PRILOSEC) 20 MG CAPSULE    Take 1 capsule (20 mg total) by mouth daily.    Review of Systems  Social History   Tobacco Use   Smoking status: Every Day    Packs/day: 0.20    Years: 32.00    Pack years: 6.40    Types: Cigarettes   Smokeless tobacco: Never   Tobacco comments:    Pt reports he has thoughts of quitting.   Substance Use Topics   Alcohol use: Yes    Alcohol/week: 9.0 standard drinks    Types: 9 Standard drinks or equivalent per week    Comment: 3-4 times per week   Objective:   BP (!) 147/79 (BP Location: Right Arm, Patient Position: Sitting, Cuff Size: Large)   Temp 97.6 F (36.4 C)   Resp 16   Ht 5\' 11"  (1.803 m)   Wt 223 lb 9.6 oz (101.4 kg)   SpO2 98%   BMI 31.19 kg/m   Physical Exam      Assessment & Plan:  Shawn Route, NP   [facility]

## 2021-06-02 ENCOUNTER — Other Ambulatory Visit: Payer: Self-pay

## 2021-06-02 ENCOUNTER — Ambulatory Visit: Payer: Self-pay | Admitting: Neurology

## 2021-06-07 ENCOUNTER — Ambulatory Visit: Payer: Self-pay | Admitting: Gerontology

## 2021-06-23 ENCOUNTER — Other Ambulatory Visit: Payer: Self-pay

## 2021-06-23 ENCOUNTER — Encounter: Payer: Self-pay | Admitting: Neurology

## 2021-06-23 ENCOUNTER — Ambulatory Visit (INDEPENDENT_AMBULATORY_CARE_PROVIDER_SITE_OTHER): Payer: Self-pay | Admitting: Neurology

## 2021-06-23 VITALS — BP 124/82 | HR 70 | Ht 71.0 in | Wt 222.4 lb

## 2021-06-23 DIAGNOSIS — G40209 Localization-related (focal) (partial) symptomatic epilepsy and epileptic syndromes with complex partial seizures, not intractable, without status epilepticus: Secondary | ICD-10-CM

## 2021-06-23 DIAGNOSIS — I63412 Cerebral infarction due to embolism of left middle cerebral artery: Secondary | ICD-10-CM

## 2021-06-23 DIAGNOSIS — I6529 Occlusion and stenosis of unspecified carotid artery: Secondary | ICD-10-CM

## 2021-06-23 MED ORDER — LEVETIRACETAM 1000 MG PO TABS
ORAL_TABLET | ORAL | 3 refills | Status: DC
  Filled 2021-06-23: qty 60, 15d supply, fill #0
  Filled 2021-07-21: qty 120, 30d supply, fill #1
  Filled 2021-08-17: qty 120, 30d supply, fill #2
  Filled 2021-10-20: qty 120, 30d supply, fill #3
  Filled 2021-12-04: qty 120, 30d supply, fill #4
  Filled 2022-01-22: qty 120, 30d supply, fill #5

## 2021-06-23 NOTE — Patient Instructions (Signed)
Increase Keppra 1000mg : take 2 tablets twice a day  2. Schedule 2-day EEG  3. Referral will be sent for Speech Therapy  4. Keep hydrated, drink at least 8 glasses of water daily  5. Continue with goal to stop smoking  6. Follow-up in 4 months, call for any changes   Seizure Precautions: 1. If medication has been prescribed for you to prevent seizures, take it exactly as directed.  Do not stop taking the medicine without talking to your doctor first, even if you have not had a seizure in a long time.   2. Avoid activities in which a seizure would cause danger to yourself or to others.  Don't operate dangerous machinery, swim alone, or climb in high or dangerous places, such as on ladders, roofs, or girders.  Do not drive unless your doctor says you may.  3. If you have any warning that you may have a seizure, lay down in a safe place where you can't hurt yourself.    4.  No driving for 6 months from last seizure, as per [facility].   Please refer to the following link on the Epilepsy Foundation of America's website for more information: http://www.epilepsyfoundation.org/answerplace/Social/driving/drivingu.cfm   5.  Maintain good sleep hygiene. Avoid alcohol.  6.  Contact your doctor if you have any problems that may be related to the medicine you are taking.  7.  Call 911 and bring the patient back to the ED if:        A.  The seizure lasts longer than 5 minutes.       B.  The patient doesn't awaken shortly after the seizure  C.  The patient has new problems such as difficulty seeing, speaking or moving  D.  The patient was injured during the seizure  E.  The patient has a temperature over 102 F (39C)  F.  The patient vomited and now is having trouble breathing

## 2021-06-23 NOTE — Progress Notes (Signed)
NEUROLOGY FOLLOW UP OFFICE NOTE  Gary [name] [id number] 1971/03/25  HISTORY OF PRESENT ILLNESS: I had the pleasure of seeing Gary [name] in follow-up in the neurology clinic on 06/23/2021.  The patient was last seen 4 months ago for recurrent stroke and seizure. He had seizures after stroke in 2017 with loss of consciousness, last typical seizure in 2018. He was found to have embolic-appearing strokes in the left frontal and parietal region in 12/2020 when he presented with episodic left facial drooping and generalized weakness. On his initial visit, he reported episodes of losing his train of thought, left side of mouth goes crooked. Levetiracetam increased to 1500mg  BID in May 2022. He states the episodes where his mouth is crooked and drooling have cut down but still happens on occasion, last episode was last month. There are infrequent episodes where people are talking to him and he can hear but does not understand what they are saying. He is concerned about mixing up words, he asked a co-worker if he had crablegs but said a different word.  He states his "mind is all mixed up." This happens on a daily basis with no loss of awareness. His left hand is still numb. There is occasional right hand tingling. He has changed the times he takes his medications and found his headaches are better. He denies any dizziness. He has some double vision and needs reading glasses. He checks his BP regularly, lowest SBP is 120, he denies any hypotension. He loses his balance but denies any falls.   History on Initial Assessment 02/27/2021: This is a 50 year old right-handed man with a history of hypertension, hyperlipidemia, prediabetes, right occipital stroke in 2017 with subsequent seizures, presenting for evaluation after admission last 12/29/20 for new stroke. Records from his hospitalization were reviewed. He was evaluated by neurologist Dr. 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 Seizures  consisted of loss of consciousness, he had an EEG and MRI in 2020. At that time he was reporting intermittent dizziness/lightheadedness. Levetiracetam was increased to 1000mg  BID. He was lost to follow-up and states that his last typical seizure with loss of consciousness was in 2018 so he self-discontinued Levetiracetam and all his other medications in 2019. On 12/29/20, he went to the ER for episodic drooping of the left side of his face, slurred speech, and generalized weakness. He would reported recurrent episodes in one week where he had sudden onset disorientation/confusion then generalized weakness, having to hold on to anything nearby until the sensation passes in 5-10 minutes. He fell when he attempted to walk during one of them. I personally reviewed MRI brain done 12/2020 which showed approximately 5 punctate areas of acute infarct in the left frontal parietal cortex compatible with small emboli, chronic right occipital infarct. CTA head and neck showed intracranial atherosclerotic disease with moderate stenosis of left M1, cavernous right ICA, mild to moderate stenosis of cavernous left ICA. Echocardiogram showed EF 55-60%, normal left atrium. He was discharged home on atorvastatin 80mg  daily, aspirin 81mg  daily, and Plavix 75mg  daily for 90 days. He has been scheduled to see Vascular Surgery next week. He had an EEG which was normal and Levetiracetam 1000mg  BID was restarted. He was back in the ER 2 weeks later reporting continued episodes of dizziness, leg weakness, and confusion. Repeat MRI brain which did not show any new changes from prior scan. He states that with the new stroke, his right hand goes numb and he is very forgetful. He continues  to have daily episodes where he loses his train of thought, wants to say a word but it won't come out. He feels dazed, confused, unable to speak or understand what people are saying. The left side of his mouth goes crooked, if he is drinking water, it would  spill out of his mouth. He feels both legs get weak. The episodes still last 5 minutes. He would feel dizzy with a slight headache and have to sit down or be still. No focal weakness after the episodes. He lives alone. He denies any olfactory/gustatory hallucinations, deja vu, rising epigastric sensation, focal numbness/tingling/weakness, myoclonic jerks. He denies missing medications, no side effects on Levetiracetam. He works doing Education officer, environmental at [employer] and has had to sit down because his lower half/hips hurt and legs feel heavy. No bowel/bladder dysfunction. His calves feel numb and tight. He has occasional back pain. He continues to drink 2-3 alcohol drinks a day but states he has cut down. LFTs showed elevated AST 75 and ALT 84.   Epilepsy Risk Factors:  Right occipital stroke. Otherwise he had a normal birth and early development.  There is no history of febrile convulsions, CNS infections such as meningitis/encephalitis, significant traumatic brain injury, neurosurgical procedures, or family history of seizures.   PAST MEDICAL HISTORY: Past Medical History:  Diagnosis Date   Alcohol use    Carotid artery occlusion    GERD (gastroesophageal reflux disease)    Hypercholesteremia    Hypertension    Seizures (HCC)    Stroke (HCC)    TIA 12/29/20, re-admitted 01/14/21 for sequelae   Tobacco abuse     MEDICATIONS: Current Outpatient Medications on File Prior to Visit  Medication Sig Dispense Refill   amLODipine (NORVASC) 10 MG tablet TAKE ONE TABLET BY MOUTH EVERY DAY 90 tablet 0   aspirin 81 MG tablet Take 1 tablet (81 mg total) by mouth daily. 30 tablet 0   atenolol (TENORMIN) 50 MG tablet TAKE 2 TABLETS (100MG  TOTAL) BY MOUTH ONCE DAILY. 180 tablet 0   atorvastatin (LIPITOR) 80 MG tablet TAKE ONE TABLET BY MOUTH AT BEDTIME 90 tablet 0   clopidogrel (PLAVIX) 75 MG tablet TAKE ONE TABLET BY MOUTH EVERY DAY 90 tablet 0   hydrochlorothiazide (HYDRODIURIL) 25 MG tablet TAKE (1/2) TABLET  (12.5MG  TOTAL) BY MOUTH ONCE DAILY. 90 tablet 2   levETIRAcetam (KEPPRA) 1000 MG tablet Take 1 and 1/2 tablets (1500mg  total) by mouth twice daily. (Patient taking differently: Take 1 and 1/2 tablets (1500mg  total) by mouth twice daily.) 270 tablet 3   losartan (COZAAR) 100 MG tablet Take 1 tablet (100 mg total) by mouth every evening. 90 tablet 2   metFORMIN (GLUCOPHAGE) 1000 MG tablet Take (1/2) tablet (500 mg total) by mouth daily with breakfast. 30 tablet 2   omeprazole (PRILOSEC) 20 MG capsule Take 1 capsule (20 mg total) by mouth once daily. 90 capsule 0   No current facility-administered medications on file prior to visit.    ALLERGIES: No Known Allergies  FAMILY HISTORY: Family History  Problem Relation Age of Onset   Hypertension Mother        pt reports mother has stent   Other Father        unknown medical history   Healthy Sister     SOCIAL HISTORY: Social History   Socioeconomic History   Marital status: Divorced    Spouse name: Not on file   Number of children: 4   Years of education: Not on file  Highest education level: High school graduate  Occupational History   Not on file  Tobacco Use   Smoking status: Every Day    Packs/day: 0.20    Years: 32.00    Pack years: 6.40    Types: Cigarettes   Smokeless tobacco: Never   Tobacco comments:    Pt reports he has thoughts of quitting.   Vaping Use   Vaping Use: Never used  Substance and Sexual Activity   Alcohol use: Yes    Alcohol/week: 9.0 standard drinks    Types: 9 Standard drinks or equivalent per week    Comment: 3-4 times per week   Drug use: No   Sexual activity: Yes    Birth control/protection: None  Other Topics Concern   Not on file  Social History Narrative   Lives at home with his aunt   Right handed   Drinks no caffeine   Social Determinants of Corporate investment banker Strain: Not on file  Food Insecurity: No Food Insecurity   Worried About Programme researcher, broadcasting/film/video in the Last  Year: Never true   Ran Out of Food in the Last Year: Never true  Transportation Needs: No Transportation Needs   Lack of Transportation (Medical): No   Lack of Transportation (Non-Medical): No  Physical Activity: Not on file  Stress: Not on file  Social Connections: Not on file  Intimate Partner Violence: Not on file     PHYSICAL EXAM: Vitals:   06/23/21 1446  BP: 124/82  Pulse: 70  SpO2: 98%   General: No acute distress Head:  Normocephalic/atraumatic Skin/Extremities: No rash, no edema Neurological Exam: alert and oriented to person, place, and time. No aphasia or dysarthria, he has occasional word-finding difficulties. Fund of knowledge is appropriate.  Recent and remote memory are intact.  Attention and concentration are normal.   Cranial nerves: Pupils equal, round. Extraocular movements intact with no nystagmus. Visual fields full.  No facial asymmetry.  Motor: Bulk and tone normal, muscle strength 5/5 throughout with no pronator drift.   Finger to nose testing intact.  Gait narrow-based and steady, able to tandem walk adequately.  Romberg negative.   IMPRESSION: This is a 50 yo RH man with a history of hypertension, hyperlipidemia, prediabetes, recurrent strokes (right occipital, left frontal/parietal) with subsequent seizures with loss of consciousness. 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 He has moderate stenosis of left M1, mild to moderate stenosis of cavernous left ICA, and moderate stenosis of cavernous right ICA. He reports a reduction in episodes of left facial droop, drooling, but continues to note daily word-finding difficulties. Increase Levetiracetam to 2000mg  BID. He will be scheduled for a 48-hour EEG to further classify his symptoms. Refer to speech therapy for word-finding difficulties. I still wonder if there is an element of hypoperfusion through diseased vessels, encouraged to drink at least 8 glasses  of water daily and continue with goal to stop smoking. He is aware of [place] driving laws to stop driving after an episode of loss of awareness until 6 months seizure-free. Follow-up in 4 months, call for any changes.     Thank you for allowing me to participate in his care.  Please do not hesitate to call for any questions or concerns.  , M.D.   CC: Patrcia Dolly, NP

## 2021-06-26 ENCOUNTER — Other Ambulatory Visit: Payer: Self-pay

## 2021-06-27 ENCOUNTER — Other Ambulatory Visit: Payer: Self-pay

## 2021-07-13 ENCOUNTER — Ambulatory Visit (HOSPITAL_COMMUNITY)
Admission: RE | Admit: 2021-07-13 | Discharge: 2021-07-13 | Disposition: A | Discharge status: Home / Self Care | Payer: Self-pay | Source: Ambulatory Visit | Attending: Vascular Surgery | Admitting: Vascular Surgery

## 2021-07-13 ENCOUNTER — Ambulatory Visit (INDEPENDENT_AMBULATORY_CARE_PROVIDER_SITE_OTHER)
Admission: RE | Admit: 2021-07-13 | Discharge: 2021-07-13 | Disposition: A | Discharge status: Home / Self Care | Payer: Self-pay | Source: Ambulatory Visit | Attending: Vascular Surgery | Admitting: Vascular Surgery

## 2021-07-13 ENCOUNTER — Ambulatory Visit (INDEPENDENT_AMBULATORY_CARE_PROVIDER_SITE_OTHER): Payer: Self-pay | Admitting: Physician Assistant

## 2021-07-13 ENCOUNTER — Other Ambulatory Visit: Payer: Self-pay

## 2021-07-13 VITALS — BP 167/97 | HR 64 | Temp 97.5°F | Resp 18

## 2021-07-13 DIAGNOSIS — I739 Peripheral vascular disease, unspecified: Secondary | ICD-10-CM

## 2021-07-13 DIAGNOSIS — F17200 Nicotine dependence, unspecified, uncomplicated: Secondary | ICD-10-CM

## 2021-07-13 DIAGNOSIS — I70213 Atherosclerosis of native arteries of extremities with intermittent claudication, bilateral legs: Secondary | ICD-10-CM

## 2021-07-13 NOTE — Progress Notes (Signed)
HISTORY AND PHYSICAL     CC:  follow up. Requesting Provider:  Regino Bellow, NP  HPI: This is a 50 y.o. male who is here today for follow up for PAD and was a pt of Dr. Darrick Penna.  At his initial visit, pt had hx of 2 prior strokes and had recovered somewhat but was still having some memory issues and some numbness and tingling in his hands.  He also was having seizures and these were felt to be due to his strokes.  At that visit, pt had c/o bilateral leg weakness and was sent for ABI, which were decreased at 0.6 bilaterally.  Dr. Darrick Penna had discussed with pt that he wanted to get his seizures under better control and if this became disabling, we could talk about arteriogram.  He was scheduled for 3 month f/u with BLE arterial duplex and ABI.  The pt returns today for follow up.  He states that he continues to work at [employer] in [place].  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   He still has some speech issues from his stroke.  He does not have any non healing wounds or rest pain.  He continues to smoke.  He states that his seizures are under much better control and rarely has one.   He states he is on medication for pre-diabetes.   The pt is on a statin for cholesterol management.    The pt is on an aspirin.    Other AC:  Plavix The pt is on BB, ARB for hypertension.   Pt does not have family hx of AAA.  Past Medical History:  Diagnosis Date   Alcohol use    Carotid artery occlusion    GERD (gastroesophageal reflux disease)    Hypercholesteremia    Hypertension    Seizures (HCC)    Stroke (HCC)    TIA 12/29/20, re-admitted 01/14/21 for sequelae   Tobacco abuse     Past Surgical History:  Procedure Laterality Date   NO PAST SURGERIES      No Known Allergies  Current Outpatient Medications  Medication Sig Dispense Refill   amLODipine (NORVASC) 10 MG tablet TAKE  ONE TABLET BY MOUTH EVERY DAY 90 tablet 0   aspirin 81 MG tablet Take 1 tablet (81 mg total) by mouth daily. 30 tablet 0   atenolol (TENORMIN) 50 MG tablet TAKE 2 TABLETS (100MG  TOTAL) BY MOUTH ONCE DAILY. 180 tablet 0   atorvastatin (LIPITOR) 80 MG tablet TAKE ONE TABLET BY MOUTH AT BEDTIME 90 tablet 0   clopidogrel (PLAVIX) 75 MG tablet TAKE ONE TABLET BY MOUTH EVERY DAY 90 tablet 0   hydrochlorothiazide (HYDRODIURIL) 25 MG tablet TAKE (1/2) TABLET (12.5MG  TOTAL) BY MOUTH ONCE DAILY. 90 tablet 2   levETIRAcetam (KEPPRA) 1000 MG tablet Take 2 tablets (2000mg  total) by mouth twice daily. 360 tablet 3   losartan (COZAAR) 100 MG tablet Take 1 tablet (100 mg total) by mouth every evening. 90 tablet 2   metFORMIN (GLUCOPHAGE) 1000 MG tablet Take (1/2) tablet (500 mg total) by mouth daily with breakfast. 30 tablet 2   omeprazole (PRILOSEC) 20 MG capsule Take 1 capsule (20 mg total) by mouth once daily. 90 capsule 0   No current facility-administered medications for this visit.    Family History  Problem Relation Age of Onset  Hypertension Mother        pt reports mother has stent   Other Father        unknown medical history   Healthy Sister     Social History   Socioeconomic History   Marital status: Divorced    Spouse name: Not on file   Number of children: 4   Years of education: Not on file   Highest education level: High school graduate  Occupational History   Not on file  Tobacco Use   Smoking status: Every Day    Packs/day: 0.20    Years: 32.00    Pack years: 6.40    Types: Cigarettes   Smokeless tobacco: Never   Tobacco comments:    Pt reports he has thoughts of quitting.   Vaping Use   Vaping Use: Never used  Substance and Sexual Activity   Alcohol use: Yes    Alcohol/week: 9.0 standard drinks    Types: 9 Standard drinks or equivalent per week    Comment: 3-4 times per week   Drug use: No   Sexual activity: Yes    Birth control/protection: None  Other Topics  Concern   Not on file  Social History Narrative   Lives at home with his aunt   Right handed   Drinks no caffeine   Social Determinants of Corporate investment banker Strain: Not on file  Food Insecurity: No Food Insecurity   Worried About Programme researcher, broadcasting/film/video in the Last Year: Never true   Ran Out of Food in the Last Year: Never true  Transportation Needs: No Transportation Needs   Lack of Transportation (Medical): No   Lack of Transportation (Non-Medical): No  Physical Activity: Not on file  Stress: Not on file  Social Connections: Not on file  Intimate Partner Violence: Not on file     REVIEW OF SYSTEMS:   [X]  denotes positive finding, [ ]  denotes negative finding Cardiac  Comments:  Chest pain or chest pressure:    Shortness of breath upon exertion:    Short of breath when lying flat:    Irregular heart rhythm:        Vascular    Pain in calf, thigh, or hip brought on by ambulation: x   Pain in feet at night that wakes you up from your sleep:  x occasionally  Blood clot in your veins:    Leg swelling:         Pulmonary    Oxygen at home:    Productive cough:     Wheezing:         Neurologic    weakness in legs:  x   Sudden numbness in arms or legs:     difficulty speaking  x   Temporary loss of vision in one eye:     Problems with dizziness:         Gastrointestinal    Blood in stool:     Vomited blood:         Genitourinary    Burning when urinating:     Blood in urine:        Psychiatric    Major depression:         Hematologic    Bleeding problems:    Problems with blood clotting too easily:        Skin    Rashes or ulcers:        Constitutional    Fever or chills:  PHYSICAL EXAMINATION:  Today's Vitals   07/13/21 1259  BP: (!) 167/97  Pulse: 64  Resp: 18  Temp: (!) 97.5 F (36.4 C)  TempSrc: Temporal  SpO2: 99%   There is no height or weight on file to calculate BMI.   General:  WDWN in NAD; vital signs documented  above Gait: Not observed HENT: WNL, normocephalic Pulmonary: normal non-labored breathing , without wheezing Cardiac: regular HR,  without carotid bruits Abdomen: soft, NT, no masses; aortic pulse is not palpable Skin: without rashes Vascular Exam/Pulses:  Right Left  Radial 2+ (normal) 2+ (normal)  Femoral 1+ (weak) 1+ (weak)  Popliteal Unable to palpate Unable to palpate  DP monophasic AT is faint monophasic  PT 1+ (weak); brisk doppler 1+ (weak) brisk doppler  Peroneal monophasic monophasic   Extremities: without ischemic changes, without Gangrene , without cellulitis; without open wounds;  Musculoskeletal: no muscle wasting or atrophy  Neurologic: A&O X 3 Psychiatric:  The pt has Normal affect.   Non-Invasive Vascular Imaging:   ABI's/TBI's on 07/13/2021: Right:  0.88/0.66 - Great toe pressure: 113 Left:  0.77/0.58 - Great toe pressure: 98  BLE Arterial duplex on 07/13/2021: +-----------+--------+-----+--------+----------+-----------+  RIGHT      PSV cm/sRatioStenosisWaveform  Comments     +-----------+--------+-----+--------+----------+-----------+  CFA Prox   176                  triphasic              +-----------+--------+-----+--------+----------+-----------+  CFA Distal 129                  triphasic              +-----------+--------+-----+--------+----------+-----------+  DFA        137                  triphasic              +-----------+--------+-----+--------+----------+-----------+  SFA Prox   94                   biphasic               +-----------+--------+-----+--------+----------+-----------+  SFA Mid    41 / 0       occludedmonophasicmid to dist  +-----------+--------+-----+--------+----------+-----------+  SFA Distal 23           occluded          collateral   +-----------+--------+-----+--------+----------+-----------+  POP Prox   23                   monophasic              +-----------+--------+-----+--------+----------+-----------+  POP Mid    36                   monophasic             +-----------+--------+-----+--------+----------+-----------+  POP Distal 50                   monophasic             +-----------+--------+-----+--------+----------+-----------+  ATA Distal 19                   monophasic             +-----------+--------+-----+--------+----------+-----------+  PTA Distal 40                   monophasic             +-----------+--------+-----+--------+----------+-----------+  PERO Distal13                   monophasic             +-----------+--------+-----+--------+----------+-----------+         +-----------+--------+-----+---------------+----------+----------+  LEFT       PSV cm/sRatioStenosis       Waveform  Comments    +-----------+--------+-----+---------------+----------+----------+  CFA Prox   75                          triphasic             +-----------+--------+-----+---------------+----------+----------+  CFA Distal 83                          triphasic             +-----------+--------+-----+---------------+----------+----------+  DFA        198                         triphasic             +-----------+--------+-----+---------------+----------+----------+  SFA Prox   17 / 204     occluded                 colaterals  +-----------+--------+-----+---------------+----------+----------+  SFA Mid    15           occluded                 colaterals  +-----------+--------+-----+---------------+----------+----------+  SFA Distal 202 / 27     50-74% stenosis                      +-----------+--------+-----+---------------+----------+----------+  POP Prox   29                          monophasic            +-----------+--------+-----+---------------+----------+----------+  POP Mid    24                          monophasic             +-----------+--------+-----+---------------+----------+----------+  POP Distal 29                          monophasic            +-----------+--------+-----+---------------+----------+----------+  ATA Distal 12                          monophasic            +-----------+--------+-----+---------------+----------+----------+  PTA Distal 32                          monophasic            +-----------+--------+-----+---------------+----------+----------+  PERO Distal                                      NV          +-----------+--------+-----+---------------+----------+----------+   Previous ABI's/TBI's on 04/06/2021: Right:  0.60/0.42 - Great toe pressure: 64 Left:  0.57/0.35 - Great toe pressure:  53   ASSESSMENT/PLAN:: 49 y.o.  male here for follow up for claudication  PAD -pt does not have non healing wounds or rest pain.  He does have BLE claudication and he does have to stop and rest while working, but he is able to perform his duties.  Discussed the above studies with Dr. Edilia Bo and given the pt does not have any non healing wounds or rest pain and his last creatinine was 1.58, would recommend continuing his walking program for at least 30 minutes per day, which he has continued since his visit with Dr. Darrick Penna. Pt does have palpable PT pulses bilaterally.  Discussed with pt that if he developed non healing wounds or rest pain, we would proceed with arteriogram.  Dr. Darrick Penna wanted pt's seizures under better control and pt states they are much improved.   Current smoker -discussed with pt the importance of smoking cessation especially given he is diabetic and already has disease.  He expressed understanding  -pt will f/u in 3 months with BLE arterial duplex and ABI and see MD for further evaluation. -continue statin/asa/plavix   Doreatha Massed, [facility] [phone]  Clinic MD:   Edilia Bo

## 2021-07-14 ENCOUNTER — Other Ambulatory Visit: Payer: Self-pay

## 2021-07-14 DIAGNOSIS — I70213 Atherosclerosis of native arteries of extremities with intermittent claudication, bilateral legs: Secondary | ICD-10-CM

## 2021-07-18 ENCOUNTER — Encounter (HOSPITAL_COMMUNITY): Payer: Self-pay | Admitting: Speech Pathology

## 2021-07-19 ENCOUNTER — Telehealth: Payer: Self-pay | Admitting: Gerontology

## 2021-07-19 NOTE — Telephone Encounter (Signed)
-----   Message from Rolm Gala, NP sent at 07/19/2021  8:37 AM EDT ----- Pls schedule an in clinic appointment for Gary [name] on 09/06/21 and make a telephone note. Thank you

## 2021-07-21 ENCOUNTER — Other Ambulatory Visit: Payer: Self-pay

## 2021-08-17 ENCOUNTER — Other Ambulatory Visit: Payer: Self-pay | Admitting: Gerontology

## 2021-08-17 ENCOUNTER — Other Ambulatory Visit: Payer: Self-pay

## 2021-08-17 DIAGNOSIS — K219 Gastro-esophageal reflux disease without esophagitis: Secondary | ICD-10-CM

## 2021-08-17 DIAGNOSIS — I10 Essential (primary) hypertension: Secondary | ICD-10-CM

## 2021-08-17 DIAGNOSIS — I639 Cerebral infarction, unspecified: Secondary | ICD-10-CM

## 2021-08-17 MED ORDER — CLOPIDOGREL BISULFATE 75 MG PO TABS
ORAL_TABLET | Freq: Every day | ORAL | 0 refills | Status: DC
  Filled 2021-08-17: qty 90, 90d supply, fill #0

## 2021-08-17 MED FILL — Amlodipine Besylate Tab 10 MG (Base Equivalent): ORAL | 90 days supply | Qty: 90 | Fill #0 | Status: AC

## 2021-08-17 MED FILL — Atenolol Tab 50 MG: ORAL | 90 days supply | Qty: 180 | Fill #0 | Status: AC

## 2021-08-17 MED FILL — Omeprazole Cap Delayed Release 20 MG: ORAL | 90 days supply | Qty: 90 | Fill #0 | Status: AC

## 2021-08-24 ENCOUNTER — Ambulatory Visit: Payer: Self-pay | Attending: Neurology | Admitting: Speech Pathology

## 2021-08-24 ENCOUNTER — Other Ambulatory Visit: Payer: Self-pay

## 2021-08-24 DIAGNOSIS — I63412 Cerebral infarction due to embolism of left middle cerebral artery: Secondary | ICD-10-CM

## 2021-08-24 DIAGNOSIS — R4701 Aphasia: Secondary | ICD-10-CM

## 2021-08-24 DIAGNOSIS — R41841 Cognitive communication deficit: Secondary | ICD-10-CM

## 2021-08-24 DIAGNOSIS — G40209 Localization-related (focal) (partial) symptomatic epilepsy and epileptic syndromes with complex partial seizures, not intractable, without status epilepticus: Secondary | ICD-10-CM

## 2021-08-25 NOTE — Therapy (Signed)
[place] MAIN [facility] SERVICES [address] [place], [place], [address] Phone: [phone]   Fax:  [phone]  Speech Language Pathology Evaluation  Patient Details  Name: Gary [name] MRN: [id number] Date of Birth: 1971-01-06 Referring Provider (SLP): Ellouise Newer   Encounter Date: 08/24/2021   End of Session - 08/24/21 2250     Visit Number 1    Number of Visits 12    Date for SLP Re-Evaluation 11/16/21    Authorization Type Self-Pay    Authorization Time Period 08/24/2021 thru 11/16/2021    Authorization - Visit Number 1    Progress Note Due on Visit 10    SLP Start Time 1000    SLP Stop Time  1100    SLP Time Calculation (min) 60 min    Activity Tolerance Patient tolerated treatment well             Past Medical History:  Diagnosis Date   Alcohol use    Carotid artery occlusion    GERD (gastroesophageal reflux disease)    Hypercholesteremia    Hypertension    Seizures ([place])    Stroke ([place])    TIA 12/29/20, re-admitted 01/14/21 for sequelae   Tobacco abuse     Past Surgical History:  Procedure Laterality Date   NO PAST SURGERIES      There were no vitals filed for this visit.   Subjective Assessment - 08/24/21 2235     Subjective pt pleasant, flat affect    Currently in Pain? No/denies                SLP Evaluation [facility] - 08/24/21 2235       SLP Visit Information   SLP Received On 08/24/21    Referring Provider (SLP) Ellouise Newer    Onset Date 12/27/2020    Medical Diagnosis Cerebrovascular accident (CVA) due to embolism of left middle cerebral artery      General Information   HPI Gary [name] is a 50 y/o male with PMH of Seizure, Alcohol use, Hypercholesterolemia, Hypertension, hx of 3 strokes (October 2017) and a more recent stroke on 12/27/2020. MRI imaging revealed small embolic infarcts in the left frontal parietal lobe with chronic hemorrhagic infarct right occipital lobe with few small  white matter hyperintensities.    Behavioral/Cognition appropriate    Mobility Status ambulatory      Balance Screen   Has the patient fallen in the past 6 months No    Has the patient had a decrease in activity level because of a fear of falling?  No    Is the patient reluctant to leave their home because of a fear of falling?  No      Prior Functional Status   Cognitive/Linguistic Baseline Information not available    Type of Home Apartment     Lives With Alone    Vocation Full time employment      Cognition   Overall Cognitive Status Difficult to assess    Difficult to assess due to Impaired communication      Auditory Comprehension   Overall Auditory Comprehension Appears within functional limits for tasks assessed    Yes/No Questions Within Functional Limits    Commands Within Functional Limits      Visual Recognition/Discrimination   Discrimination Within Function Limits      Reading Comprehension   Reading Status Within funtional limits      Expression   Primary Mode of Expression Verbal  Verbal Expression   Overall Verbal Expression Impaired    Initiation Impaired    Automatic Speech Name;Social Response    Level of Generative/Spontaneous Verbalization Sentence    Repetition No impairment   pt with baseline dysfluency since childhood   Naming No impairment    Pragmatics Impairment    Impairments Abnormal affect;Eye contact;Monotone    Non-Verbal Means of Communication Not applicable      Written Expression   Dominant Hand Right    Written Expression --   needs further assessment     Oral Motor/Sensory Function   Overall Oral Motor/Sensory Function Appears within functional limits for tasks assessed      Motor Speech   Overall Motor Speech Impaired at baseline   hisotry of childhood dysfluency, continues to be at baseline     Standardized Assessments   Standardized Assessments  Western Aphasia Battery;Western Aphasia Battery revised                 SLP Education - 08/24/21 2249     Education Details results of assessment, ST POC    Person(s) Educated Patient    Methods Explanation    Comprehension Verbalized understanding;Need further instruction                SLP Long Term Goals - 08/25/21 1623       SLP LONG TERM GOAL #1   Title Pt will utilize word finding strategies to express semi-complex thoughts/information/ideas with Mod I.    Time 12    Period Weeks    Status New    Target Date 11/17/21              Plan - 08/24/21 2254     Clinical Impression Statement Pt presents with mild word finding abilities, moderate reading deficits and writing deficits. At baseline, pt has chronic history of stuttering that is at baseline. He also provides that he has "never been a good Clinical research associate or good at reading." Pt does work full-time and his current reading and writing abilities limit his ability to fully participate in daily and work related tasks.   Formally, pt obtained the following scores on the Western Aphasia Battery (Revised).   Western Aphasia Battery- Revised  Spontaneous Speech  Information content 10/10  Fluency 10/10  Comprehension  Yes/No questions 57/60  Auditory Word Recognition 60/60  Sequential Commands 68/80  Repetition 98/100  Naming  Object Naming 60/60  Word Fluency 9/20  Sentence Completion 10/10  Responsive Speech 10/10  Aphasia Quotient 96/100  Reading and Writing  Reading 73/100  Writing - unable to complete d/t pt frustration with errors  Skilled St intervention is required to further assess pt's cognitive communication abilities, target his overall language deficits to increase pt's functional independence and reduce caregiver burden.    Speech Therapy Frequency 1x /week    Duration 12 weeks    Treatment/Interventions Language facilitation;Internal/external aids;Functional tasks;SLP instruction and feedback;Patient/family education    Potential to Achieve  Goals Fair    Potential Considerations Medical prognosis;Severity of impairments;Financial resources;Family/community support;Previous level of function    Consulted and Agree with Plan of Care Patient             Patient will benefit from skilled therapeutic intervention in order to improve the following deficits and impairments:   Aphasia  Cognitive communication deficit  Cerebral infarction due to embolism of left middle cerebral artery (HCC)  Complex partial seizures with consciousness impaired [facility])    Problem List Patient Active Problem  List   Diagnosis Date Noted   Elevated serum creatinine 04/20/2021   Elevated liver enzymes 02/16/2021   Peripheral neuropathy 02/16/2021   Elevated troponin 12/29/2020   Stroke ([place]) 12/29/2020   Acute renal failure superimposed on stage 2 chronic kidney disease ([place]) 12/29/2020   Alcohol abuse    Tobacco abuse    Hypertension 11/07/2017   Hyperlipidemia 11/07/2017   Seizures ([place]) 11/07/2017   History of stroke 11/07/2017   Submandibular abscess 11/07/2017   Alexxis Mackert B. Rutherford Nail M.S., CCC-SLP, [place] Pathologist Rehabilitation Services Office [phone]  Stormy Fabian 08/25/2021, 4:24 PM  [place] MAIN [facility] SERVICES [address] [place], [place], [address] Phone: [phone]   Fax:  [phone]  Name: Duvon Nations MRN: [id number] Date of Birth: [birth year]

## 2021-08-28 ENCOUNTER — Ambulatory Visit: Payer: Self-pay | Admitting: Speech Pathology

## 2021-09-04 ENCOUNTER — Encounter: Payer: Self-pay | Admitting: Speech Pathology

## 2021-09-07 ENCOUNTER — Ambulatory Visit: Payer: Self-pay | Attending: Neurology | Admitting: Speech Pathology

## 2021-09-07 ENCOUNTER — Ambulatory Visit: Payer: Self-pay | Admitting: Gerontology

## 2021-09-07 ENCOUNTER — Other Ambulatory Visit: Payer: Self-pay

## 2021-09-07 VITALS — BP 148/77 | HR 81 | Temp 98.0°F | Resp 16 | Wt 222.0 lb

## 2021-09-07 DIAGNOSIS — R41841 Cognitive communication deficit: Secondary | ICD-10-CM | POA: Insufficient documentation

## 2021-09-07 DIAGNOSIS — Z720 Tobacco use: Secondary | ICD-10-CM

## 2021-09-07 DIAGNOSIS — G4700 Insomnia, unspecified: Secondary | ICD-10-CM

## 2021-09-07 DIAGNOSIS — I639 Cerebral infarction, unspecified: Secondary | ICD-10-CM

## 2021-09-07 DIAGNOSIS — R4701 Aphasia: Secondary | ICD-10-CM | POA: Insufficient documentation

## 2021-09-07 DIAGNOSIS — G40209 Localization-related (focal) (partial) symptomatic epilepsy and epileptic syndromes with complex partial seizures, not intractable, without status epilepticus: Secondary | ICD-10-CM | POA: Insufficient documentation

## 2021-09-07 DIAGNOSIS — I63412 Cerebral infarction due to embolism of left middle cerebral artery: Secondary | ICD-10-CM | POA: Insufficient documentation

## 2021-09-07 DIAGNOSIS — R519 Headache, unspecified: Secondary | ICD-10-CM

## 2021-09-07 DIAGNOSIS — I10 Essential (primary) hypertension: Secondary | ICD-10-CM

## 2021-09-07 DIAGNOSIS — E785 Hyperlipidemia, unspecified: Secondary | ICD-10-CM

## 2021-09-07 DIAGNOSIS — R7989 Other specified abnormal findings of blood chemistry: Secondary | ICD-10-CM

## 2021-09-07 DIAGNOSIS — R7303 Prediabetes: Secondary | ICD-10-CM

## 2021-09-07 DIAGNOSIS — K219 Gastro-esophageal reflux disease without esophagitis: Secondary | ICD-10-CM

## 2021-09-07 MED ORDER — OMEPRAZOLE 20 MG PO CPDR
20.0000 mg | DELAYED_RELEASE_CAPSULE | Freq: Every day | ORAL | 0 refills | Status: DC
  Filled 2021-09-07 – 2021-10-31 (×2): qty 90, 90d supply, fill #0

## 2021-09-07 MED ORDER — AMLODIPINE BESYLATE 10 MG PO TABS
ORAL_TABLET | Freq: Every day | ORAL | 0 refills | Status: DC
  Filled 2021-09-07: qty 90, fill #0

## 2021-09-07 MED ORDER — MELATONIN 1 MG PO CAPS
1.0000 mg | ORAL_CAPSULE | Freq: Every day | ORAL | 0 refills | Status: DC
  Filled 2021-09-07: qty 30, 30d supply, fill #0

## 2021-09-07 MED ORDER — CLOPIDOGREL BISULFATE 75 MG PO TABS
ORAL_TABLET | Freq: Every day | ORAL | 0 refills | Status: DC
  Filled 2021-09-07: qty 90, fill #0

## 2021-09-07 MED ORDER — ATORVASTATIN CALCIUM 80 MG PO TABS
80.0000 mg | ORAL_TABLET | Freq: Every day | ORAL | 0 refills | Status: DC
  Filled 2021-09-07: qty 90, 90d supply, fill #0

## 2021-09-07 NOTE — Patient Instructions (Signed)
Utilize SFA strategy when in home and community to facilitate word finding

## 2021-09-07 NOTE — Patient Instructions (Signed)

## 2021-09-07 NOTE — Progress Notes (Signed)
Established Patient Office Visit  Subjective:  Patient ID: Gary [name], male    DOB: [birth year]  Age: 50 y.o. MRN: [id number]  CC: No chief complaint on file.   HPI Gary [name] is a 50 y/o male with PMH of Seizure, Alcohol use, Hypercholesterolemia, Hypertension, Stroke, presents for follow up visit and medication refill. He was seen on 06/23/21 by Neurology  Dr Doyle Askew for follow up of recurrent stroke and seizure. He will continue on 2000 mg Levetiracetam bid, 48 hour EEG to be done. He was seen by Vascular Surgery on 07/13/21 for PAD. He will follow up in 3 months with BLE arterial duplex and ABI. Currently he continues to experience intermittent claudication with standing at work,  he states that taking frequent breaks relieves symptoms. He also c/o experiencing intermittent right temporal headache daily that starts in the morning but improves with taking tylenol. He states that he checks his blood pressure occasionally with headache and it's usually in 140's. He also c/o insomnia, stating that he has difficulty staying asleep. He states that he goes to bed at 10 pm,wakes up at 12 MN and turns and toss until 6am. His Serum creatinine done on 05/25/21 was 1.58 mg/dl and eGFR 53. Overall, he states that he's doing well and offers no further concerns.   Past Medical History:  Diagnosis Date   Alcohol use    Carotid artery occlusion    GERD (gastroesophageal reflux disease)    Hypercholesteremia    Hypertension    Seizures (HCC)    Stroke ([place])    TIA 12/29/20, re-admitted 01/14/21 for sequelae   Tobacco abuse     Past Surgical History:  Procedure Laterality Date   NO PAST SURGERIES      Family History  Problem Relation Age of Onset   Hypertension Mother        pt reports mother has stent   Other Father        unknown medical history   Healthy Sister     Social History   Socioeconomic History   Marital status: Divorced    Spouse name: Not on file   Number of  children: 4   Years of education: Not on file   Highest education level: High school graduate  Occupational History   Not on file  Tobacco Use   Smoking status: Every Day    Packs/day: 0.20    Years: 32.00    Pack years: 6.40    Types: Cigarettes   Smokeless tobacco: Never   Tobacco comments:    Pt reports he has thoughts of quitting.   Vaping Use   Vaping Use: Never used  Substance and Sexual Activity   Alcohol use: Yes    Alcohol/week: 9.0 standard drinks    Types: 9 Standard drinks or equivalent per week    Comment: 3-4 times per week   Drug use: No   Sexual activity: Yes    Birth control/protection: None  Other Topics Concern   Not on file  Social History Narrative   Lives at home with his aunt   Right handed   Drinks no caffeine   Social Determinants of Radio broadcast assistant Strain: Not on file  Food Insecurity: No Food Insecurity   Worried About Charity fundraiser in the Last Year: Never true   Ran Out of Food in the Last Year: Never true  Transportation Needs: No Transportation Needs   Lack of Transportation (Medical): No  Lack of Transportation (Non-Medical): No  Physical Activity: Not on file  Stress: Not on file  Social Connections: Not on file  Intimate Partner Violence: Not on file    Outpatient Medications Prior to Visit  Medication Sig Dispense Refill   aspirin 81 MG tablet Take 1 tablet (81 mg total) by mouth daily. 30 tablet 0   atenolol (TENORMIN) 50 MG tablet TAKE 2 TABLETS (100MG TOTAL) BY MOUTH ONCE DAILY. 180 tablet 0   levETIRAcetam (KEPPRA) 1000 MG tablet Take 2 tablets (20107m total) by mouth twice daily. 360 tablet 3   losartan (COZAAR) 100 MG tablet Take 1 tablet (100 mg total) by mouth every evening. 90 tablet 2   metFORMIN (GLUCOPHAGE) 1000 MG tablet Take (1/2) tablet (500 mg total) by mouth daily with breakfast. 30 tablet 2   amLODipine (NORVASC) 10 MG tablet TAKE ONE TABLET BY MOUTH EVERY DAY 90 tablet 0   atorvastatin  (LIPITOR) 80 MG tablet TAKE ONE TABLET BY MOUTH AT BEDTIME 90 tablet 0   clopidogrel (PLAVIX) 75 MG tablet TAKE ONE TABLET BY MOUTH EVERY DAY 90 tablet 0   hydrochlorothiazide (HYDRODIURIL) 25 MG tablet TAKE (1/2) TABLET (12.5MG TOTAL) BY MOUTH ONCE DAILY. 90 tablet 2   omeprazole (PRILOSEC) 20 MG capsule Take 1 capsule (20 mg total) by mouth once daily. 90 capsule 0   No facility-administered medications prior to visit.    No Known Allergies  ROS Review of Systems  Constitutional: Negative.   Eyes: Negative.   Respiratory: Negative.    Cardiovascular: Negative.   Skin: Negative.   Neurological:  Positive for headaches (intermittent headache).  Hematological: Negative.   Psychiatric/Behavioral:  Positive for sleep disturbance.      Objective:    Physical Exam HENT:     Head: Normocephalic and atraumatic.     Mouth/Throat:     Mouth: Mucous membranes are moist.  Eyes:     Extraocular Movements: Extraocular movements intact.     Conjunctiva/sclera: Conjunctivae normal.     Pupils: Pupils are equal, round, and reactive to light.  Cardiovascular:     Rate and Rhythm: Normal rate and regular rhythm.     Pulses: Normal pulses.     Heart sounds: Normal heart sounds.  Pulmonary:     Effort: Pulmonary effort is normal.     Breath sounds: Normal breath sounds.  Skin:    General: Skin is warm.  Neurological:     General: No focal deficit present.     Mental Status: He is alert and oriented to person, place, and time. Mental status is at baseline.  Psychiatric:        Mood and Affect: Mood normal.        Behavior: Behavior normal.        Thought Content: Thought content normal.        Judgment: Judgment normal.    BP (!) 148/77 (BP Location: Right Arm, Patient Position: Sitting, Cuff Size: Large)   Pulse 81   Temp 98 F (36.7 C)   Resp 16   Wt 222 lb (100.7 kg)   BMI 30.96 kg/m  Wt Readings from Last 3 Encounters:  09/07/21 222 lb (100.7 kg)  06/23/21 222 lb 6.4 oz  (100.9 kg)  06/01/21 223 lb 9.6 oz (101.4 kg)     Health Maintenance Due  Topic Date Due   COVID-19 Vaccine (1) Never done   Pneumococcal Vaccine 137630Years old (1 - PCV) Never done   FOOT EXAM  Never  done   OPHTHALMOLOGY EXAM  Never done   Hepatitis C Screening  Never done   TETANUS/TDAP  Never done   COLONOSCOPY (Pts 45-65yr Insurance coverage will need to be confirmed)  Never done   INFLUENZA VACCINE  Never done    There are no preventive care reminders to display for this patient.  Lab Results  Component Value Date   TSH 1.400 02/02/2021   Lab Results  Component Value Date   WBC 8.9 02/02/2021   HGB 12.2 (L) 02/02/2021   HCT 35.5 (L) 02/02/2021   MCV 95 02/02/2021   PLT 302 02/02/2021   Lab Results  Component Value Date   NA 140 09/07/2021   K 4.4 09/07/2021   CO2 22 09/07/2021   GLUCOSE 88 09/07/2021   BUN 15 09/07/2021   CREATININE 1.30 (H) 09/07/2021   BILITOT 0.2 09/07/2021   ALKPHOS 125 (H) 09/07/2021   AST 63 (H) 09/07/2021   ALT 66 (H) 09/07/2021   PROT 7.4 09/07/2021   ALBUMIN 4.5 09/07/2021   CALCIUM 9.5 09/07/2021   ANIONGAP 10 01/14/2021   EGFR 67 09/07/2021   Lab Results  Component Value Date   CHOL 197 09/07/2021   Lab Results  Component Value Date   HDL 37 (L) 09/07/2021   Lab Results  Component Value Date   LDLCALC 131 (H) 09/07/2021   Lab Results  Component Value Date   TRIG 163 (H) 09/07/2021   Lab Results  Component Value Date   CHOLHDL 5.3 (H) 09/07/2021   Lab Results  Component Value Date   HGBA1C 5.8 (H) 09/07/2021      Assessment & Plan:     1. Essential hypertension -His blood pressure is not under control, he will continue on current treatment regimen, DASH diet and exercise as tolerated. - amLODipine (NORVASC) 10 MG tablet; TAKE ONE TABLET BY MOUTH EVERY DAY  Dispense: 90 tablet; Refill: 0 - Comp Met (CMET); Future - Comp Met (CMET)  2. Insomnia, unspecified type - Unknown etiology of Insomnia, he  will start Melatonin 1 mg qhs, [facility] He was advised to practice sleep hygiene. - Melatonin 1 MG CAPS; Take 1 capsule (1 mg total) by mouth once daily at bedtime.  Dispense: 30 capsule; Refill: 0  3. Hyperlipidemia, unspecified hyperlipidemia type - He will continue on current medication, low fat/cholesterol diet and will check Lipid panel. - atorvastatin (LIPITOR) 80 MG tablet; TAKE ONE TABLET BY MOUTH AT BEDTIME  Dispense: 90 tablet; Refill: 0 - Lipid panel; Future - Lipid panel  4. Cerebrovascular accident (CVA), unspecified mechanism ([place] - He will continue on current medication, advised to notify clinic , go to the ED for hematuria, hematochezia,active bleeding and claudication. - clopidogrel (PLAVIX) 75 MG tablet; TAKE ONE TABLET BY MOUTH ONCE EVERY DAY.  Dispense: 90 tablet; Refill: 0  5. Gastroesophageal reflux disease - His acid reflux is under control, he will continue on current medication regimen. -Avoid spicy, fatty and fried food -Avoid sodas and sour juices -Avoid heavy meals -Avoid eating 4 hours before bedtime -Elevate head of bed at night - omeprazole (PRILOSEC) 20 MG capsule; Take 1 capsule (20 mg total) by mouth once daily.  Dispense: 90 capsule; Refill: 0  6. Elevated serum creatinine - Will recheck serum creatinine,Hydrochlorothiazide was discontinued, he was advised to increase water intake.  7. Tobacco abuse - Encouraged on smoking cessation, provided NCQuit line information   8. Prediabetes - His last HgbA1c  was 6.3%, will recheck HgbA1c, he was advised to continue on low carb/non concentrated sweet diet. - HgB A1c; Future - HgB A1c  9. Nonintractable headache, unspecified chronicity pattern, unspecified headache type - Headache might be due to insomnia, or unknown etiology, he was advised to practice sleep hygiene and go to the ED for worsening symptoms.      Follow-up: Return in about 2 weeks (around  09/21/2021), or if symptoms worsen or fail to improve.    Miyani Cronic Jerold Coombe, NP

## 2021-09-07 NOTE — Therapy (Signed)
[place] [facility]IN [facility] SERVICES [address] [place], [place], [address] Phone: [phone]   Fax:  [phone]  Speech Language Pathology Treatment  Patient Details  Name: Gary [name] MRN: [id number] Date of Birth: 1971/07/05 Referring Provider (SLP): Ellouise Newer   Encounter Date: 09/07/2021   End of Session - 09/07/21 1118     Visit Number 2    Number of Visits 12    Date for SLP Re-Evaluation 11/16/21    Authorization Type Self-Pay    Authorization Time Period 08/24/2021 thru 11/16/2021    Authorization - Visit Number 2    Progress Note Due on Visit 10    SLP Start Time 1000    SLP Stop Time  1100    SLP Time Calculation (min) 60 min    Activity Tolerance Patient tolerated treatment well             Past Medical History:  Diagnosis Date   Alcohol use    Carotid artery occlusion    GERD (gastroesophageal reflux disease)    Hypercholesteremia    Hypertension    Seizures ([place])    Stroke ([place])    TIA 12/29/20, re-admitted 01/14/21 for sequelae   Tobacco abuse     Past Surgical History:  Procedure Laterality Date   NO PAST SURGERIES      There were no vitals filed for this visit.   Subjective Assessment - 09/07/21 1108     Subjective Patient pleasant and motivated for therapy.    Currently in Pain? No/denies                   ADULT SLP TREATMENT - 09/07/21 0001       General Information   Behavior/Cognition Cooperative;Alert;Pleasant mood      Treatment Provided   Treatment provided Cognitive-Linquistic      Cognitive-Linquistic Treatment   Treatment focused on Aphasia    Skilled Treatment SLP trained semantic feature analyis SFA with visual aid. Initial training with SLP provided x5 words related to work tasks, patient provided x3 features with moderate contextual/questions cues to verbalize features. Improved carry over of task as patient IND provided x3 features in 25% of x8 opportunities. 50%  (4/8) word opportunities patient IND provided 2 of 3 features for word finding and 25%  (2/8) patient IND provided 1/3 features. Patient improved to provided total x3 features in all opportunities with moderate assistance via questions and visual cues to provide a faciliator (feature) word for word finding. SLP modeled SFA and provided x3 features for patient to complete word finding--100% accuracy demonstrated.      Assessment / Recommendations / Plan   Plan Continue with current plan of care              SLP Education - 09/07/21 1118     Education Details SFA and benefits/use    Person(s) Educated Patient    Methods Explanation;Demonstration    Comprehension Verbalized understanding;Need further instruction;Verbal cues required;Tactile cues required                SLP Long Term Goals - 08/25/21 1623       SLP LONG TERM GOAL #1   Title Pt will utilize word finding strategies to express semi-complex thoughts/information/ideas with Mod I.    Time 12    Period Weeks    Status New    Target Date 11/17/21              Plan -  09/07/21 1118     Clinical Impression Statement Pt presents with mild word finding abilities, moderate reading deficits and writing deficits. Session today targeted training for semantic feature analysis in aliance with patient directed goals/areas of frustration: word finding post stroke. Patient with improved carryover of strategy with continued tasks during session as above though requires moderate cues/questions to achieve >1 feature to facilitate independence in word finding. Skilled St intervention is required to further assess pt's cognitive ocmmuncation abilities, target his overall language deficits to increase pt's functional independence and reduce caregiver burden.    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)  Cognitive communication deficit  Cerebral infarction due to embolism of left middle cerebral artery [facility])    Problem List Patient Active Problem List   Diagnosis Date Noted   Elevated serum creatinine 04/20/2021   Elevated liver enzymes 02/16/2021   Peripheral neuropathy 02/16/2021   Elevated troponin 12/29/2020   Stroke (HCC) 12/29/2020   Acute renal failure superimposed on stage 2 chronic kidney disease (HCC) 12/29/2020   Alcohol abuse    Tobacco abuse    Hypertension 11/07/2017   Hyperlipidemia 11/07/2017   Seizures (HCC) 11/07/2017   History of stroke 11/07/2017   Submandibular abscess 11/07/2017   Merita Norton, M.A. CCC-SLP   [place], CCC-SLP 09/07/2021, 11:21 AM  [place] [facility] MAIN [facility] SERVICES [address] [place], [place], [address] Phone: [phone]   Fax:  [phone]   Name: Gary [name] MRN: [id number] Date of Birth: Apr 30, 1971

## 2021-09-08 ENCOUNTER — Other Ambulatory Visit: Payer: Self-pay

## 2021-09-08 LAB — COMPREHENSIVE METABOLIC PANEL
ALT: 66 IU/L — ABNORMAL HIGH (ref 0–44)
AST: 63 IU/L — ABNORMAL HIGH (ref 0–40)
Albumin/Globulin Ratio: 1.6 (ref 1.2–2.2)
Albumin: 4.5 g/dL (ref 4.0–5.0)
Alkaline Phosphatase: 125 IU/L — ABNORMAL HIGH (ref 44–121)
BUN/Creatinine Ratio: 12 (ref 9–20)
BUN: 15 mg/dL (ref 6–24)
Bilirubin Total: 0.2 mg/dL (ref 0.0–1.2)
CO2: 22 mmol/L (ref 20–29)
Calcium: 9.5 mg/dL (ref 8.7–10.2)
Chloride: 102 mmol/L (ref 96–106)
Creatinine, Ser: 1.3 mg/dL — ABNORMAL HIGH (ref 0.76–1.27)
Globulin, Total: 2.9 g/dL (ref 1.5–4.5)
Glucose: 88 mg/dL (ref 70–99)
Potassium: 4.4 mmol/L (ref 3.5–5.2)
Sodium: 140 mmol/L (ref 134–144)
Total Protein: 7.4 g/dL (ref 6.0–8.5)
eGFR: 67 mL/min/{1.73_m2} (ref >59)

## 2021-09-08 LAB — LIPID PANEL
Chol/HDL Ratio: 5.3 ratio — ABNORMAL HIGH (ref 0.0–5.0)
Cholesterol, Total: 197 mg/dL (ref 100–199)
HDL: 37 mg/dL — ABNORMAL LOW (ref >39)
LDL Chol Calc (NIH): 131 mg/dL — ABNORMAL HIGH (ref 0–99)
Triglycerides: 163 mg/dL — ABNORMAL HIGH (ref 0–149)
VLDL Cholesterol Cal: 29 mg/dL (ref 5–40)

## 2021-09-08 LAB — HEMOGLOBIN A1C
Est. average glucose Bld gHb Est-mCnc: 120 mg/dL
Hgb A1c MFr Bld: 5.8 % — ABNORMAL HIGH (ref 4.8–5.6)

## 2021-09-10 DIAGNOSIS — R519 Headache, unspecified: Secondary | ICD-10-CM | POA: Insufficient documentation

## 2021-09-14 ENCOUNTER — Ambulatory Visit: Payer: Self-pay | Admitting: Speech Pathology

## 2021-09-18 ENCOUNTER — Encounter: Payer: Self-pay | Admitting: Speech Pathology

## 2021-09-21 ENCOUNTER — Ambulatory Visit: Payer: Self-pay

## 2021-09-28 ENCOUNTER — Ambulatory Visit: Payer: Self-pay | Admitting: Speech Pathology

## 2021-10-05 ENCOUNTER — Ambulatory Visit: Payer: Self-pay | Admitting: Speech Pathology

## 2021-10-10 ENCOUNTER — Other Ambulatory Visit: Payer: Self-pay

## 2021-10-11 ENCOUNTER — Other Ambulatory Visit: Payer: Self-pay

## 2021-10-12 ENCOUNTER — Encounter (HOSPITAL_COMMUNITY): Payer: Self-pay

## 2021-10-12 ENCOUNTER — Ambulatory Visit (HOSPITAL_COMMUNITY): Payer: Self-pay

## 2021-10-12 ENCOUNTER — Ambulatory Visit: Payer: Self-pay | Admitting: Vascular Surgery

## 2021-10-19 ENCOUNTER — Encounter: Payer: Self-pay | Admitting: Gerontology

## 2021-10-19 ENCOUNTER — Other Ambulatory Visit: Payer: Self-pay

## 2021-10-19 ENCOUNTER — Ambulatory Visit: Payer: Self-pay | Admitting: Gerontology

## 2021-10-19 VITALS — BP 167/90 | HR 70 | Temp 98.3°F | Resp 17 | Ht 71.0 in | Wt 220.3 lb

## 2021-10-19 DIAGNOSIS — M79602 Pain in left arm: Secondary | ICD-10-CM | POA: Insufficient documentation

## 2021-10-19 DIAGNOSIS — R7303 Prediabetes: Secondary | ICD-10-CM

## 2021-10-19 DIAGNOSIS — I10 Essential (primary) hypertension: Secondary | ICD-10-CM

## 2021-10-19 DIAGNOSIS — R748 Abnormal levels of other serum enzymes: Secondary | ICD-10-CM

## 2021-10-19 DIAGNOSIS — E785 Hyperlipidemia, unspecified: Secondary | ICD-10-CM

## 2021-10-19 DIAGNOSIS — I639 Cerebral infarction, unspecified: Secondary | ICD-10-CM

## 2021-10-19 DIAGNOSIS — M79601 Pain in right arm: Secondary | ICD-10-CM

## 2021-10-19 DIAGNOSIS — R7989 Other specified abnormal findings of blood chemistry: Secondary | ICD-10-CM

## 2021-10-19 MED ORDER — CLOPIDOGREL BISULFATE 75 MG PO TABS
ORAL_TABLET | Freq: Every day | ORAL | 0 refills | Status: DC
  Filled 2021-10-19: qty 90, fill #0

## 2021-10-19 MED ORDER — ATORVASTATIN CALCIUM 80 MG PO TABS
80.0000 mg | ORAL_TABLET | Freq: Every day | ORAL | 0 refills | Status: DC
  Filled 2021-10-19: qty 90, 90d supply, fill #0

## 2021-10-19 MED ORDER — METFORMIN HCL 1000 MG PO TABS
500.0000 mg | ORAL_TABLET | Freq: Every day | ORAL | 2 refills | Status: DC
  Filled 2021-10-19: qty 30, 60d supply, fill #0
  Filled 2022-02-22: qty 45, 90d supply, fill #1

## 2021-10-19 MED ORDER — AMLODIPINE BESYLATE 10 MG PO TABS
ORAL_TABLET | Freq: Every day | ORAL | 0 refills | Status: DC
  Filled 2021-10-19: qty 90, fill #0

## 2021-10-19 NOTE — Progress Notes (Signed)
Established Patient Office Visit  Subjective:  Patient ID: Gary [name], male    DOB: Mar 17, 1971  Age: 51 y.o. MRN: [id number]  CC:  Chief Complaint  Patient presents with   Follow-up    Labs drawn 09/07/21    HPI Gary [name] is a 51 y/o male with PMH of Seizure, Alcohol use, Hypercholesterolemia, Hypertension, Stroke, presents for follow up visit and lab review. His blood pressure was elevated during visit, he states that he didn't take his medication prior to clinic visit because he forgot. He denies chest pain, palpitation, headache, dizziness and vision changes. His HgbA1c done on 09/07/21 decreased from 6.3% to 5.8%, Triglycerides decreased from 184 mg/dl to 163 mg/dl, HDL decreased from 41 to 37 mg/dl, and LDL increased from 122 mg/dl to 131 mg/dl. His Serum creatinine decreased from 1.58 mg/dl to 1.30 mg/dl. His ALT was 66 IU/L and AST 63 IU/L, Alkaline Phosphatase 125 IU/L. He c/o constant non radiating discomfort to bilateral elbows and fore arms that has been going on for few weeks. 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 He denies paresthesia, motor nor muscle weakness. He states that lifting an object aggravates symptoms. He states that pain is worst in the morning but resolves as the day progresses.He states that taking tylenol does not relieve symptoms. Overall, he states that he's doing well and offers no further complaint.  Past Medical History:  Diagnosis Date   Alcohol use    Carotid artery occlusion    GERD (gastroesophageal reflux disease)    Hypercholesteremia    Hypertension    Seizures (HCC)    Stroke ([place])    TIA 12/29/20, re-admitted 01/14/21 for sequelae   Tobacco abuse     Past Surgical History:  Procedure Laterality Date   NO PAST SURGERIES      Family History  Problem Relation Age of Onset   Hypertension Mother        pt reports mother has stent   Other Father        unknown medical history    Healthy Sister     Social History   Socioeconomic History   Marital status: Divorced    Spouse name: Not on file   Number of children: 4   Years of education: Not on file   Highest education level: High school graduate  Occupational History   Not on file  Tobacco Use   Smoking status: Every Day    Packs/day: 0.25    Years: 32.00    Pack years: 8.00    Types: Cigarettes   Smokeless tobacco: Never   Tobacco comments:    Pt reports he has thoughts of quitting.   Vaping Use   Vaping Use: Never used  Substance and Sexual Activity   Alcohol use: Yes    Alcohol/week: 9.0 standard drinks    Types: 9 Standard drinks or equivalent per week    Comment: 3-4 times per week   Drug use: No   Sexual activity: Yes    Birth control/protection: None  Other Topics Concern   Not on file  Social History Narrative   Lives at home with his aunt   Right handed   Drinks no caffeine   Social Determinants of Health   Financial Resource Strain: Not on file  Food Insecurity: No Food Insecurity   Worried About Running Out of Food in the Last Year: Never true   Ran Out of  Food in the Last Year: Never true  Transportation Needs: No Transportation Needs   Lack of Transportation (Medical): No   Lack of Transportation (Non-Medical): No  Physical Activity: Not on file  Stress: Not on file  Social Connections: Not on file  Intimate Partner Violence: Not on file    Outpatient Medications Prior to Visit  Medication Sig Dispense Refill   acetaminophen (TYLENOL) 500 MG tablet Take 500 mg by mouth every 6 (six) hours as needed. Once a day for headache/pain     aspirin 81 MG tablet Take 1 tablet (81 mg total) by mouth daily. 30 tablet 0   atenolol (TENORMIN) 50 MG tablet TAKE 2 TABLETS (100MG TOTAL) BY MOUTH ONCE DAILY. 180 tablet 0   levETIRAcetam (KEPPRA) 1000 MG tablet Take 2 tablets (2064m total) by mouth twice daily. 360 tablet 3   losartan (COZAAR) 100 MG tablet Take 1 tablet (100 mg total) by  mouth every evening. 90 tablet 2   Melatonin 1 MG CAPS Take 1 capsule (1 mg total) by mouth once daily at bedtime. 30 capsule 0   omeprazole (PRILOSEC) 20 MG capsule Take 1 capsule (20 mg total) by mouth once daily. 90 capsule 0   amLODipine (NORVASC) 10 MG tablet TAKE ONE TABLET BY MOUTH EVERY DAY 90 tablet 0   atorvastatin (LIPITOR) 80 MG tablet TAKE ONE TABLET BY MOUTH AT BEDTIME 90 tablet 0   clopidogrel (PLAVIX) 75 MG tablet TAKE ONE TABLET BY MOUTH ONCE EVERY DAY. 90 tablet 0   metFORMIN (GLUCOPHAGE) 1000 MG tablet Take (1/2) tablet (500 mg total) by mouth daily with breakfast. 30 tablet 2   No facility-administered medications prior to visit.    No Known Allergies  ROS Review of Systems  Constitutional: Negative.   Respiratory: Negative.    Cardiovascular: Negative.   Gastrointestinal: Negative.   Endocrine: Negative.   Musculoskeletal:  Positive for myalgias (bilateral arm pain).  Skin: Negative.   Neurological: Negative.   Psychiatric/Behavioral: Negative.       Objective:    Physical Exam HENT:     Head: Normocephalic and atraumatic.  Eyes:     Extraocular Movements: Extraocular movements intact.     Conjunctiva/sclera: Conjunctivae normal.     Pupils: Pupils are equal, round, and reactive to light.  Cardiovascular:     Rate and Rhythm: Normal rate and regular rhythm.     Pulses: Normal pulses.     Heart sounds: Normal heart sounds.  Pulmonary:     Effort: Pulmonary effort is normal.     Breath sounds: Normal breath sounds.  Musculoskeletal:        General: Tenderness (to bialteral fore arm with palpation) present.  Neurological:     General: No focal deficit present.     Mental Status: He is alert and oriented to person, place, and time. Mental status is at baseline.  Psychiatric:        Mood and Affect: Mood normal.        Behavior: Behavior normal.        Thought Content: Thought content normal.        Judgment: Judgment normal.    BP (!) 167/90 (BP  Location: Left Arm, Patient Position: Sitting, Cuff Size: Large) Comment: has not taken HTN meds today   Pulse 70    Temp 98.3 F (36.8 C) (Oral)    Resp 17    Ht _0  (1.803 m)    Wt 220 lb 4.8 oz (99.9 kg)  SpO2 97%    BMI 30.73 kg/m  Wt Readings from Last 3 Encounters:  10/19/21 220 lb 4.8 oz (99.9 kg)  09/07/21 222 lb (100.7 kg)  06/23/21 222 lb 6.4 oz (100.9 kg)   Encouraged weight loss  Health Maintenance Due  Topic Date Due   COVID-19 Vaccine (1) Never done   Pneumococcal Vaccine 59-50 Years old (1 - PCV) Never done   FOOT EXAM  Never done   OPHTHALMOLOGY EXAM  Never done   Hepatitis C Screening  Never done   TETANUS/TDAP  Never done   Zoster Vaccines- Shingrix (1 of 2) Never done   COLONOSCOPY (Pts 45-77yr Insurance coverage will need to be confirmed)  Never done   INFLUENZA VACCINE  Never done    There are no preventive care reminders to display for this patient.  Lab Results  Component Value Date   TSH 1.400 02/02/2021   Lab Results  Component Value Date   WBC 8.9 02/02/2021   HGB 12.2 (L) 02/02/2021   HCT 35.5 (L) 02/02/2021   MCV 95 02/02/2021   PLT 302 02/02/2021   Lab Results  Component Value Date   NA 140 09/07/2021   K 4.4 09/07/2021   CO2 22 09/07/2021   GLUCOSE 88 09/07/2021   BUN 15 09/07/2021   CREATININE 1.30 (H) 09/07/2021   BILITOT 0.2 09/07/2021   ALKPHOS 125 (H) 09/07/2021   AST 63 (H) 09/07/2021   ALT 66 (H) 09/07/2021   PROT 7.4 09/07/2021   ALBUMIN 4.5 09/07/2021   CALCIUM 9.5 09/07/2021   ANIONGAP 10 01/14/2021   EGFR 67 09/07/2021   Lab Results  Component Value Date   CHOL 197 09/07/2021   Lab Results  Component Value Date   HDL 37 (L) 09/07/2021   Lab Results  Component Value Date   LDLCALC 131 (H) 09/07/2021   Lab Results  Component Value Date   TRIG 163 (H) 09/07/2021   Lab Results  Component Value Date   CHOLHDL 5.3 (H) 09/07/2021   Lab Results  Component Value Date   HGBA1C 5.8 (H) 09/07/2021       Assessment & Plan:   1. Essential hypertension - His blood pressure is not under control, he was encouraged to take his medication as prescribed. Educated on signs and symptoms of stroke and to go to the ED. He was advised to continue on DASH diet. - amLODipine (NORVASC) 10 MG tablet; TAKE ONE TABLET BY MOUTH EVERY DAY  Dispense: 90 tablet; Refill: 0  2. Hyperlipidemia, unspecified hyperlipidemia type -He will continue current medication,low fat/cholesterol diet and  will recheck Liver enzymes.  His goal LDL should be less than 70 mg/dl. - atorvastatin (LIPITOR) 80 MG tablet; TAKE ONE TABLET BY MOUTH AT BEDTIME  Dispense: 90 tablet; Refill: 0  3. Cerebrovascular accident (CVA), unspecified mechanism ([place] - He will continue current medication, was advised to notify clinic and go to the ED for hematuria, hematochezia or active bleeding. - clopidogrel (PLAVIX) 75 MG tablet; TAKE ONE TABLET BY MOUTH ONCE EVERY DAY.  Dispense: 90 tablet; Refill: 0  4. Prediabetes - His prediabetes is improving at 5.8%, he will continue on current medication, low carb/non concentrated sweet diet. - metFORMIN (GLUCOPHAGE) 1000 MG tablet; Take (1/2) tablet (500 mg total) by mouth once daily with breakfast.  Dispense: 30 tablet; Refill: 2  5. Bilateral arm pain - Probably muscle strain,He was advised to apply otc analgesic cream and will follow up with Dr HEsmeralda Links He was advised  to go to the ED for worsening symptoms. - 6. Elevated liver enzymes - His liver enzymes are elevated, on statin, will recheck in 3 months. He was encouraged not to take 4000 mg of Tylenol.  7. Elevated serum creatinine - his serum creatinine level is improving at 1.30 mg/dl, he was encouraged to increase water intake.     Follow-up: Return in about 8 weeks (around 12/14/2021), or if symptoms worsen or fail to improve.    Kyian Obst Jerold Coombe, NP

## 2021-10-19 NOTE — Patient Instructions (Signed)
Heart-Healthy Eating Plan Heart-healthy meal planning includes: Eating less unhealthy fats. Eating more healthy fats. Making other changes in your diet. Talk with your doctor or a diet specialist (dietitian) to create an eating plan that is right for you. What is my plan? Your doctor may recommend an eating plan that includes: Total fat: ______% or less of total calories a day. Saturated fat: ______% or less of total calories a day. Cholesterol: less than _________mg a day. What are tips for following this plan? Cooking Avoid frying your food. Try to bake, boil, grill, or broil it instead. You can also reduce fat by: Removing the skin from poultry. Removing all visible fats from meats. Steaming vegetables in water or broth. Meal planning  At meals, divide your plate into four equal parts: Fill one-half of your plate with vegetables and green salads. Fill one-fourth of your plate with whole grains. Fill one-fourth of your plate with lean protein foods. Eat 4-5 servings of vegetables per day. A serving of vegetables is: 1 cup of raw or cooked vegetables. 2 cups of raw leafy greens. Eat 4-5 servings of fruit per day. A serving of fruit is: 1 medium whole fruit.  cup of dried fruit.  cup of fresh, frozen, or canned fruit.  cup of 100% fruit juice. Eat more foods that have soluble fiber. These are apples, broccoli, carrots, beans, peas, and barley. Try to get 20-30 g of fiber per day. Eat 4-5 servings of nuts, legumes, and seeds per week: 1 serving of dried beans or legumes equals  cup after being cooked. 1 serving of nuts is  cup. 1 serving of seeds equals 1 tablespoon. General information Eat more home-cooked food. Eat less restaurant, buffet, and fast food. Limit or avoid alcohol. Limit foods that are high in starch and sugar. Avoid fried foods. Lose weight if you are overweight. Keep track of how much salt (sodium) you eat. This is important if you have high blood  pressure. Ask your doctor to tell you more about this. Try to add vegetarian meals each week. Fats Choose healthy fats. These include olive oil and canola oil, flaxseeds, walnuts, almonds, and seeds. Eat more omega-3 fats. These include salmon, mackerel, sardines, tuna, flaxseed oil, and ground flaxseeds. Try to eat fish at least 2 times each week. Check food labels. Avoid foods with trans fats or high amounts of saturated fat. Limit saturated fats. These are often found in animal products, such as meats, butter, and cream. These are also found in plant foods, such as palm oil, palm kernel oil, and coconut oil. Avoid foods with partially hydrogenated oils in them. These have trans fats. Examples are stick margarine, some tub margarines, cookies, crackers, and other baked goods. What foods can I eat? Fruits All fresh, canned (in natural juice), or frozen fruits. Vegetables Fresh or frozen vegetables (raw, steamed, roasted, or grilled). Green salads. Grains Most grains. Choose whole wheat and whole grains most of the time. Rice and pasta, including brown rice and pastas made with whole wheat. Meats and other proteins Lean, well-trimmed beef, veal, pork, and lamb. Chicken and turkey without skin. All fish and shellfish. Wild duck, rabbit, pheasant, and venison. Egg whites or low-cholesterol egg substitutes. Dried beans, peas, lentils, and tofu. Seeds and most nuts. Dairy Low-fat or nonfat cheeses, including ricotta and mozzarella. Skim or 1% milk that is liquid, powdered, or evaporated. Buttermilk that is made with low-fat milk. Nonfat or low-fat yogurt. Fats and oils Non-hydrogenated (trans-free) margarines. Vegetable oils, including   soybean, sesame, sunflower, olive, peanut, safflower, corn, canola, and cottonseed. Salad dressings or mayonnaise made with a vegetable oil. Beverages Mineral water. Coffee and tea. Diet carbonated beverages. Sweets and desserts Sherbet, gelatin, and fruit ice.  Small amounts of dark chocolate. Limit all sweets and desserts. Seasonings and condiments All seasonings and condiments. The items listed above may not be a complete list of foods and drinks you can eat. Contact a dietitian for more options. What foods should I avoid? Fruits Canned fruit in heavy syrup. Fruit in cream or butter sauce. Fried fruit. Limit coconut. Vegetables Vegetables cooked in cheese, cream, or butter sauce. Fried vegetables. Grains Breads that are made with saturated or trans fats, oils, or whole milk. Croissants. Sweet rolls. Donuts. High-fat crackers, such as cheese crackers. Meats and other proteins Fatty meats, such as hot dogs, ribs, sausage, bacon, rib-eye roast or steak. High-fat deli meats, such as salami and bologna. Caviar. Domestic duck and goose. Organ meats, such as liver. Dairy Cream, sour cream, cream cheese, and creamed cottage cheese. Whole-milk cheeses. Whole or 2% milk that is liquid, evaporated, or condensed. Whole buttermilk. Cream sauce or high-fat cheese sauce. Yogurt that is made from whole milk. Fats and oils Meat fat, or shortening. Cocoa butter, hydrogenated oils, palm oil, coconut oil, palm kernel oil. Solid fats and shortenings, including bacon fat, salt pork, lard, and butter. Nondairy cream substitutes. Salad dressings with cheese or sour cream. Beverages Regular sodas and juice drinks with added sugar. Sweets and desserts Frosting. Pudding. Cookies. Cakes. Pies. Milk chocolate or white chocolate. Buttered syrups. Full-fat ice cream or ice cream drinks. The items listed above may not be a complete list of foods and drinks to avoid. Contact a dietitian for more information. Summary Heart-healthy meal planning includes eating less unhealthy fats, eating more healthy fats, and making other changes in your diet. Eat a balanced diet. This includes fruits and vegetables, low-fat or nonfat dairy, lean protein, nuts and legumes, whole grains, and  heart-healthy oils and fats. This information is not intended to replace advice given to you by your health care provider. Make sure you discuss any questions you have with your health care provider. Document Revised: 02/02/2021 Document Reviewed: 02/02/2021 Elsevier Patient Education  2022 [employer] DASH Eating Plan DASH stands for Dietary Approaches to Stop Hypertension. The DASH eating plan is a healthy eating plan that has been shown to: Reduce high blood pressure (hypertension). Reduce your risk for type 2 diabetes, heart disease, and stroke. Help with weight loss. What are tips for following this plan? Reading food labels Check food labels for the amount of salt (sodium) per serving. Choose foods with less than 5 percent of the Daily Value of sodium. Generally, foods with less than 300 milligrams (mg) of sodium per serving fit into this eating plan. To find whole grains, look for the word "whole" as the first word in the ingredient list. Shopping Buy products labeled as "low-sodium" or "no salt added." Buy fresh foods. Avoid canned foods and pre-made or frozen meals. Cooking Avoid adding salt when cooking. Use salt-free seasonings or herbs instead of table salt or sea salt. Check with your health care provider or pharmacist before using salt substitutes. Do not fry foods. Cook foods using healthy methods such as baking, boiling, grilling, roasting, and broiling instead. Cook with heart-healthy oils, such as olive, canola, avocado, soybean, or sunflower oil. Meal planning  Eat a balanced diet that includes: 4 or more servings of fruits and 4 or more   servings of vegetables each day. Try to fill one-half of your plate with fruits and vegetables. 6-8 servings of whole grains each day. Less than 6 oz (170 g) of lean meat, poultry, or fish each day. A 3-oz (85-g) serving of meat is about the same size as a deck of cards. One egg equals 1 oz (28 g). 2-3 servings of low-fat dairy each  day. One serving is 1 cup (237 mL). 1 serving of nuts, seeds, or beans 5 times each week. 2-3 servings of heart-healthy fats. Healthy fats called omega-3 fatty acids are found in foods such as walnuts, flaxseeds, fortified milks, and eggs. These fats are also found in cold-water fish, such as sardines, salmon, and mackerel. Limit how much you eat of: Canned or prepackaged foods. Food that is high in trans fat, such as some fried foods. Food that is high in saturated fat, such as fatty meat. Desserts and other sweets, sugary drinks, and other foods with added sugar. Full-fat dairy products. Do not salt foods before eating. Do not eat more than 4 egg yolks a week. Try to eat at least 2 vegetarian meals a week. Eat more home-cooked food and less restaurant, buffet, and fast food. Lifestyle When eating at a restaurant, ask that your food be prepared with less salt or no salt, if possible. If you drink alcohol: Limit how much you use to: 0-1 drink a day for women who are not pregnant. 0-2 drinks a day for men. Be aware of how much alcohol is in your drink. In the [place] one drink equals one 12 oz bottle of beer (355 mL), one 5 oz glass of wine (148 mL), or one 1 oz glass of hard liquor (44 mL). General information Avoid eating more than 2,300 mg of salt a day. If you have hypertension, you may need to reduce your sodium intake to 1,500 mg a day. Work with your health care provider to maintain a healthy body weight or to lose weight. Ask what an ideal weight is for you. Get at least 30 minutes of exercise that causes your heart to beat faster (aerobic exercise) most days of the week. Activities may include walking, swimming, or biking. Work with your health care provider or dietitian to adjust your eating plan to your individual calorie needs. What foods should I eat? Fruits All fresh, dried, or frozen fruit. Canned fruit in natural juice (without added sugar). Vegetables Fresh or frozen  vegetables (raw, steamed, roasted, or grilled). Low-sodium or reduced-sodium tomato and vegetable juice. Low-sodium or reduced-sodium tomato sauce and tomato paste. Low-sodium or reduced-sodium canned vegetables. Grains Whole-grain or whole-wheat bread. Whole-grain or whole-wheat pasta. Brown rice. Oatmeal. Quinoa. Bulgur. Whole-grain and low-sodium cereals. Pita bread. Low-fat, low-sodium crackers. Whole-wheat flour tortillas. Meats and other proteins Skinless chicken or turkey. Ground chicken or turkey. Pork with fat trimmed off. Fish and seafood. Egg whites. Dried beans, peas, or lentils. Unsalted nuts, nut butters, and seeds. Unsalted canned beans. Lean cuts of beef with fat trimmed off. Low-sodium, lean precooked or cured meat, such as sausages or meat loaves. Dairy Low-fat (1%) or fat-free (skim) milk. Reduced-fat, low-fat, or fat-free cheeses. Nonfat, low-sodium ricotta or cottage cheese. Low-fat or nonfat yogurt. Low-fat, low-sodium cheese. Fats and oils Soft margarine without trans fats. Vegetable oil. Reduced-fat, low-fat, or light mayonnaise and salad dressings (reduced-sodium). Canola, safflower, olive, avocado, soybean, and sunflower oils. Avocado. Seasonings and condiments Herbs. Spices. Seasoning mixes without salt. Other foods Unsalted popcorn and pretzels. Fat-free sweets. The items   listed above may not be a complete list of foods and beverages you can eat. Contact a dietitian for more information. What foods should I avoid? Fruits Canned fruit in a light or heavy syrup. Fried fruit. Fruit in cream or butter sauce. Vegetables Creamed or fried vegetables. Vegetables in a cheese sauce. Regular canned vegetables (not low-sodium or reduced-sodium). Regular canned tomato sauce and paste (not low-sodium or reduced-sodium). Regular tomato and vegetable juice (not low-sodium or reduced-sodium). Pickles. Olives. Grains Baked goods made with fat, such as croissants, muffins, or some  breads. Dry pasta or rice meal packs. Meats and other proteins Fatty cuts of meat. Ribs. Fried meat. Bacon. Bologna, salami, and other precooked or cured meats, such as sausages or meat loaves. Fat from the back of a pig (fatback). Bratwurst. Salted nuts and seeds. Canned beans with added salt. Canned or smoked fish. Whole eggs or egg yolks. Chicken or turkey with skin. Dairy Whole or 2% milk, cream, and half-and-half. Whole or full-fat cream cheese. Whole-fat or sweetened yogurt. Full-fat cheese. Nondairy creamers. Whipped toppings. Processed cheese and cheese spreads. Fats and oils Butter. Stick margarine. Lard. Shortening. Ghee. Bacon fat. Tropical oils, such as coconut, palm kernel, or palm oil. Seasonings and condiments Onion salt, garlic salt, seasoned salt, table salt, and sea salt. Worcestershire sauce. Tartar sauce. Barbecue sauce. Teriyaki sauce. Soy sauce, including reduced-sodium. Steak sauce. Canned and packaged gravies. Fish sauce. Oyster sauce. Cocktail sauce. Store-bought horseradish. Ketchup. Mustard. Meat flavorings and tenderizers. Bouillon cubes. Hot sauces. Pre-made or packaged marinades. Pre-made or packaged taco seasonings. Relishes. Regular salad dressings. Other foods Salted popcorn and pretzels. The items listed above may not be a complete list of foods and beverages you should avoid. Contact a dietitian for more information. Where to find more information National Heart, Lung, and Blood Institute: www.nhlbi.nih.gov American Heart Association: www.heart.org Academy of Nutrition and Dietetics: www.eatright.org National Kidney Foundation: www.kidney.org Summary The DASH eating plan is a healthy eating plan that has been shown to reduce high blood pressure (hypertension). It may also reduce your risk for type 2 diabetes, heart disease, and stroke. When on the DASH eating plan, aim to eat more fresh fruits and vegetables, whole grains, lean proteins, low-fat dairy, and  heart-healthy fats. With the DASH eating plan, you should limit salt (sodium) intake to 2,300 mg a day. If you have hypertension, you may need to reduce your sodium intake to 1,500 mg a day. Work with your health care provider or dietitian to adjust your eating plan to your individual calorie needs. This information is not intended to replace advice given to you by your health care provider. Make sure you discuss any questions you have with your health care provider. Document Revised: 08/28/2019 Document Reviewed: 08/28/2019 Elsevier Patient Education  2022 Elsevier Inc.  

## 2021-10-20 ENCOUNTER — Other Ambulatory Visit: Payer: Self-pay

## 2021-10-30 ENCOUNTER — Ambulatory Visit: Payer: Self-pay | Admitting: Neurology

## 2021-10-31 ENCOUNTER — Other Ambulatory Visit: Payer: Self-pay

## 2021-11-01 ENCOUNTER — Other Ambulatory Visit: Payer: Self-pay

## 2021-11-01 ENCOUNTER — Other Ambulatory Visit: Payer: Self-pay | Admitting: Emergency Medicine

## 2021-11-01 DIAGNOSIS — K219 Gastro-esophageal reflux disease without esophagitis: Secondary | ICD-10-CM

## 2021-11-01 MED ORDER — OMEPRAZOLE 20 MG PO CPDR
20.0000 mg | DELAYED_RELEASE_CAPSULE | Freq: Two times a day (BID) | ORAL | 0 refills | Status: DC
  Filled 2021-11-01: qty 60, 30d supply, fill #0
  Filled 2021-11-24: qty 60, 30d supply, fill #1

## 2021-11-01 NOTE — Progress Notes (Signed)
Adam from [facility] called stating that patient is running out of prescription of Omeprazole 20 mg early. He states that patient is sometimes having to take Omeprazole twice a day. Spoke with Lanora Manis and ok to send in new prescription for twice daily dosing. Sent 2 month supply. Will follow up at [facility] 12/14/21 with Lanora Manis.

## 2021-11-02 ENCOUNTER — Ambulatory Visit: Payer: Self-pay | Admitting: Gerontology

## 2021-11-08 ENCOUNTER — Ambulatory Visit: Payer: Self-pay | Admitting: Gerontology

## 2021-11-09 ENCOUNTER — Encounter (HOSPITAL_COMMUNITY): Payer: Self-pay

## 2021-11-09 ENCOUNTER — Ambulatory Visit: Payer: Self-pay | Admitting: Vascular Surgery

## 2021-11-22 ENCOUNTER — Other Ambulatory Visit: Payer: Self-pay

## 2021-11-23 ENCOUNTER — Ambulatory Visit: Payer: Self-pay | Admitting: Gerontology

## 2021-11-23 ENCOUNTER — Other Ambulatory Visit: Payer: Self-pay

## 2021-11-23 VITALS — BP 134/81 | HR 76 | Temp 98.1°F | Resp 16 | Ht 70.0 in | Wt 219.2 lb

## 2021-11-23 DIAGNOSIS — I639 Cerebral infarction, unspecified: Secondary | ICD-10-CM

## 2021-11-23 DIAGNOSIS — E785 Hyperlipidemia, unspecified: Secondary | ICD-10-CM

## 2021-11-23 DIAGNOSIS — R059 Cough, unspecified: Secondary | ICD-10-CM

## 2021-11-23 DIAGNOSIS — I10 Essential (primary) hypertension: Secondary | ICD-10-CM

## 2021-11-23 MED ORDER — ATORVASTATIN CALCIUM 80 MG PO TABS
80.0000 mg | ORAL_TABLET | Freq: Every day | ORAL | 0 refills | Status: DC
  Filled 2021-11-23 – 2022-02-22 (×2): qty 90, 90d supply, fill #0

## 2021-11-23 MED ORDER — ATENOLOL 50 MG PO TABS
100.0000 mg | ORAL_TABLET | Freq: Every day | ORAL | 0 refills | Status: DC
  Filled 2021-11-23: qty 180, 90d supply, fill #0

## 2021-11-23 MED ORDER — BENZONATATE 100 MG PO CAPS
100.0000 mg | ORAL_CAPSULE | Freq: Three times a day (TID) | ORAL | 0 refills | Status: DC | PRN
  Filled 2021-11-23: qty 20, 7d supply, fill #0

## 2021-11-23 MED ORDER — CLOPIDOGREL BISULFATE 75 MG PO TABS
ORAL_TABLET | Freq: Every day | ORAL | 0 refills | Status: DC
  Filled 2021-11-23: qty 90, 90d supply, fill #0

## 2021-11-23 MED ORDER — AMLODIPINE BESYLATE 5 MG PO TABS
ORAL_TABLET | Freq: Every day | ORAL | 0 refills | Status: DC
  Filled 2021-11-23: qty 180, 90d supply, fill #0
  Filled 2021-11-23: qty 90, 90d supply, fill #0

## 2021-11-23 NOTE — Patient Instructions (Signed)
Heart-Healthy Eating Plan Many factors influence your heart (coronary) health, including eating and exercise habits. Coronary risk increases with abnormal blood fat (lipid) levels. Heart-healthy meal planning includes limiting unhealthy fats, increasing healthy fats, and making other diet and lifestyle changes. What is my plan? Your health care provider may recommend that you: Limit your fat intake to _________% or less of your total calories each day. Limit your saturated fat intake to _________% or less of your total calories each day. Limit the amount of cholesterol in your diet to less than _________ mg per day. What are tips for following this plan? Cooking Cook foods using methods other than frying. Baking, boiling, grilling, and broiling are all good options. Other ways to reduce fat include: Removing the skin from poultry. Removing all visible fats from meats. Steaming vegetables in water or broth. Meal planning  At meals, imagine dividing your plate into fourths: Fill one-half of your plate with vegetables and green salads. Fill one-fourth of your plate with whole grains. Fill one-fourth of your plate with lean protein foods. Eat 4-5 servings of vegetables per day. One serving equals 1 cup raw or cooked vegetable, or 2 cups raw leafy greens. Eat 4-5 servings of fruit per day. One serving equals 1 medium whole fruit,  cup dried fruit,  cup fresh, frozen, or canned fruit, or  cup 100% fruit juice. Eat more foods that contain soluble fiber. Examples include apples, broccoli, carrots, beans, peas, and barley. Aim to get 25-30 g of fiber per day. Increase your consumption of legumes, nuts, and seeds to 4-5 servings per week. One serving of dried beans or legumes equals  cup cooked, 1 serving of nuts is  cup, and 1 serving of seeds equals 1 tablespoon. Fats Choose healthy fats more often. Choose monounsaturated and polyunsaturated fats, such as olive and canola oils, flaxseeds,  walnuts, almonds, and seeds. Eat more omega-3 fats. Choose salmon, mackerel, sardines, tuna, flaxseed oil, and ground flaxseeds. Aim to eat fish at least 2 times each week. Check food labels carefully to identify foods with trans fats or high amounts of saturated fat. Limit saturated fats. These are found in animal products, such as meats, butter, and cream. Plant sources of saturated fats include palm oil, palm kernel oil, and coconut oil. Avoid foods with partially hydrogenated oils in them. These contain trans fats. Examples are stick margarine, some tub margarines, cookies, crackers, and other baked goods. Avoid fried foods. General information Eat more home-cooked food and less restaurant, buffet, and fast food. Limit or avoid alcohol. Limit foods that are high in starch and sugar. Lose weight if you are overweight. Losing just 5-10% of your body weight can help your overall health and prevent diseases such as diabetes and heart disease. Monitor your salt (sodium) intake, especially if you have high blood pressure. Talk with your health care provider about your sodium intake. Try to incorporate more vegetarian meals weekly. What foods can I eat? Fruits All fresh, canned (in natural juice), or frozen fruits. Vegetables Fresh or frozen vegetables (raw, steamed, roasted, or grilled). Green salads. Grains Most grains. Choose whole wheat and whole grains most of the time. Rice and pasta, including brown rice and pastas made with whole wheat. Meats and other proteins Lean, well-trimmed beef, veal, pork, and lamb. Chicken and turkey without skin. All fish and shellfish. Wild duck, rabbit, pheasant, and venison. Egg whites or low-cholesterol egg substitutes. Dried beans, peas, lentils, and tofu. Seeds and most nuts. Dairy Low-fat or nonfat cheeses,   including ricotta and mozzarella. Skim or 1% milk (liquid, powdered, or evaporated). Buttermilk made with low-fat milk. Nonfat or low-fat  yogurt. Fats and oils Non-hydrogenated (trans-free) margarines. Vegetable oils, including soybean, sesame, sunflower, olive, peanut, safflower, corn, canola, and cottonseed. Salad dressings or mayonnaise made with a vegetable oil. Beverages Water (mineral or sparkling). Coffee and tea. Diet carbonated beverages. Sweets and desserts Sherbet, gelatin, and fruit ice. Small amounts of dark chocolate. Limit all sweets and desserts. Seasonings and condiments All seasonings and condiments. The items listed above may not be a complete list of foods and beverages you can eat. Contact a dietitian for more options. What foods are not recommended? Fruits Canned fruit in heavy syrup. Fruit in cream or butter sauce. Fried fruit. Limit coconut. Vegetables Vegetables cooked in cheese, cream, or butter sauce. Fried vegetables. Grains Breads made with saturated or trans fats, oils, or whole milk. Croissants. Sweet rolls. Donuts. High-fat crackers, such as cheese crackers. Meats and other proteins Fatty meats, such as hot dogs, ribs, sausage, bacon, rib-eye roast or steak. High-fat deli meats, such as salami and bologna. Caviar. Domestic duck and goose. Organ meats, such as liver. Dairy Cream, sour cream, cream cheese, and creamed cottage cheese. Whole-milk cheeses. Whole or 2% milk (liquid, evaporated, or condensed). Whole buttermilk. Cream sauce or high-fat cheese sauce. Whole-milk yogurt. Fats and oils Meat fat, or shortening. Cocoa butter, hydrogenated oils, palm oil, coconut oil, palm kernel oil. Solid fats and shortenings, including bacon fat, salt pork, lard, and butter. Nondairy cream substitutes. Salad dressings with cheese or sour cream. Beverages Regular sodas and any drinks with added sugar. Sweets and desserts Frosting. Pudding. Cookies. Cakes. Pies. Milk chocolate or white chocolate. Buttered syrups. Full-fat ice cream or ice cream drinks. The items listed above may not be a complete list of  foods and beverages to avoid. Contact a dietitian for more information. Summary Heart-healthy meal planning includes limiting unhealthy fats, increasing healthy fats, and making other diet and lifestyle changes. Lose weight if you are overweight. Losing just 5-10% of your body weight can help your overall health and prevent diseases such as diabetes and heart disease. Focus on eating a balance of foods, including fruits and vegetables, low-fat or nonfat dairy, lean protein, nuts and legumes, whole grains, and heart-healthy oils and fats. This information is not intended to replace advice given to you by your health care provider. Make sure you discuss any questions you have with your health care provider. Document Revised: 02/02/2021 Document Reviewed: 02/02/2021 Elsevier Patient Education  2022 Elsevier Inc.  

## 2021-11-23 NOTE — Progress Notes (Signed)
Established Patient Office Visit  Subjective:  Patient ID: Aron Inge, male    DOB: 09-11-1971  Age: 51 y.o. MRN: [id number]  CC:  Chief Complaint  Patient presents with   Follow-up    HPI Ediberto Sens  is a 51 y/o male with PMH of Seizure, Alcohol use, Hypercholesterolemia, Hypertension, Stroke,presents for routine follow up visit and medication refill. He states that he's compliant with his medications, denies side effects and continues to make healthy lifestyle changes. He cancelled his Neurology and Cardiology appointments because they conflict with his schedule and in [place]. Currently, he c/o having productive cough with whitish phlegm  that has been going on for 2 weeks. He states that taking Alkazeltzer plus and Nyquil minimally relieves symptoms. He denies chest tightness, wheezing and shortness of breath. Overall, he states that he's doing well and offers no further complaints.   Past Medical History:  Diagnosis Date   Alcohol use    Carotid artery occlusion    GERD (gastroesophageal reflux disease)    Hypercholesteremia    Hypertension    Seizures (HCC)    Stroke ([place])    TIA 12/29/20, re-admitted 01/14/21 for sequelae   Tobacco abuse     Past Surgical History:  Procedure Laterality Date   NO PAST SURGERIES      Family History  Problem Relation Age of Onset   Hypertension Mother        pt reports mother has stent   Other Father        unknown medical history   Healthy Sister     Social History   Socioeconomic History   Marital status: Divorced    Spouse name: Not on file   Number of children: 4   Years of education: Not on file   Highest education level: High school graduate  Occupational History   Not on file  Tobacco Use   Smoking status: Every Day    Packs/day: 0.25    Years: 32.00    Pack years: 8.00    Types: Cigarettes   Smokeless tobacco: Never   Tobacco comments:    Pt reports he has thoughts of quitting.   Vaping Use   Vaping  Use: Never used  Substance and Sexual Activity   Alcohol use: Yes    Alcohol/week: 9.0 standard drinks    Types: 9 Standard drinks or equivalent per week    Comment: 3-4 times per week   Drug use: No   Sexual activity: Yes    Birth control/protection: None  Other Topics Concern   Not on file  Social History Narrative   Lives at home with his aunt   Right handed   Drinks no caffeine   Social Determinants of Radio broadcast assistant Strain: Not on file  Food Insecurity: No Food Insecurity   Worried About Charity fundraiser in the Last Year: Never true   Ran Out of Food in the Last Year: Never true  Transportation Needs: No Transportation Needs   Lack of Transportation (Medical): No   Lack of Transportation (Non-Medical): No  Physical Activity: Not on file  Stress: Not on file  Social Connections: Not on file  Intimate Partner Violence: Not on file    Outpatient Medications Prior to Visit  Medication Sig Dispense Refill   acetaminophen (TYLENOL) 500 MG tablet Take 500 mg by mouth every 6 (six) hours as needed. Once a day for headache/pain     aspirin 81 MG tablet Take 1 tablet (  81 mg total) by mouth daily. 30 tablet 0   levETIRAcetam (KEPPRA) 1000 MG tablet Take 2 tablets (2030m total) by mouth twice daily. 360 tablet 3   losartan (COZAAR) 100 MG tablet Take 1 tablet (100 mg total) by mouth every evening. 90 tablet 2   Melatonin 1 MG CAPS Take 1 capsule (1 mg total) by mouth once daily at bedtime. 30 capsule 0   metFORMIN (GLUCOPHAGE) 1000 MG tablet Take (1/2) tablet (500 mg total) by mouth once daily with breakfast. 30 tablet 2   omeprazole (PRILOSEC) 20 MG capsule Take 1 capsule (20 mg total) by mouth in the morning and at bedtime. 120 capsule 0   amLODipine (NORVASC) 10 MG tablet TAKE ONE TABLET BY MOUTH EVERY DAY 90 tablet 0   atorvastatin (LIPITOR) 80 MG tablet TAKE ONE TABLET BY MOUTH AT BEDTIME 90 tablet 0   clopidogrel (PLAVIX) 75 MG tablet TAKE ONE TABLET BY MOUTH  ONCE EVERY DAY. 90 tablet 0   atenolol (TENORMIN) 50 MG tablet TAKE 2 TABLETS (100MG TOTAL) BY MOUTH ONCE DAILY. 180 tablet 0   No facility-administered medications prior to visit.    No Known Allergies  ROS Review of Systems  Constitutional: Negative.   HENT: Negative.    Eyes: Negative.   Respiratory:  Positive for cough. Negative for chest tightness, shortness of breath and wheezing.   Cardiovascular: Negative.   Skin: Negative.   Neurological: Negative.   Hematological: Negative.   Psychiatric/Behavioral: Negative.       Objective:    Physical Exam HENT:     Head: Normocephalic and atraumatic.     Nose: Nose normal.     Mouth/Throat:     Mouth: Mucous membranes are moist.  Eyes:     Extraocular Movements: Extraocular movements intact.     Conjunctiva/sclera: Conjunctivae normal.     Pupils: Pupils are equal, round, and reactive to light.  Cardiovascular:     Rate and Rhythm: Normal rate and regular rhythm.     Pulses: Normal pulses.     Heart sounds: Normal heart sounds.  Pulmonary:     Effort: Pulmonary effort is normal.     Breath sounds: Normal breath sounds.  Skin:    General: Skin is warm.  Neurological:     General: No focal deficit present.     Mental Status: He is alert and oriented to person, place, and time. Mental status is at baseline.    BP 134/81 (BP Location: Right Arm, Patient Position: Sitting, Cuff Size: Large)    Pulse 76    Temp 98.1 F (36.7 C) (Oral)    Resp 16    Ht 5' 10"  (1.778 m)    Wt 219 lb 3.2 oz (99.4 kg)    SpO2 95%    BMI 31.45 kg/m  Wt Readings from Last 3 Encounters:  11/23/21 219 lb 3.2 oz (99.4 kg)  10/19/21 220 lb 4.8 oz (99.9 kg)  09/07/21 222 lb (100.7 kg)   Encouraged weight loss  Health Maintenance Due  Topic Date Due   COVID-19 Vaccine (1) Never done   FOOT EXAM  Never done   OPHTHALMOLOGY EXAM  Never done   Hepatitis C Screening  Never done   TETANUS/TDAP  Never done   Zoster Vaccines- Shingrix (1 of 2)  Never done   COLONOSCOPY (Pts 45-424yrInsurance coverage will need to be confirmed)  Never done    There are no preventive care reminders to display for this patient.  Lab  Results  Component Value Date   TSH 1.400 02/02/2021   Lab Results  Component Value Date   WBC 8.9 02/02/2021   HGB 12.2 (L) 02/02/2021   HCT 35.5 (L) 02/02/2021   MCV 95 02/02/2021   PLT 302 02/02/2021   Lab Results  Component Value Date   NA 140 09/07/2021   K 4.4 09/07/2021   CO2 22 09/07/2021   GLUCOSE 88 09/07/2021   BUN 15 09/07/2021   CREATININE 1.30 (H) 09/07/2021   BILITOT 0.2 09/07/2021   ALKPHOS 125 (H) 09/07/2021   AST 63 (H) 09/07/2021   ALT 66 (H) 09/07/2021   PROT 7.4 09/07/2021   ALBUMIN 4.5 09/07/2021   CALCIUM 9.5 09/07/2021   ANIONGAP 10 01/14/2021   EGFR 67 09/07/2021   Lab Results  Component Value Date   CHOL 197 09/07/2021   Lab Results  Component Value Date   HDL 37 (L) 09/07/2021   Lab Results  Component Value Date   LDLCALC 131 (H) 09/07/2021   Lab Results  Component Value Date   TRIG 163 (H) 09/07/2021   Lab Results  Component Value Date   CHOLHDL 5.3 (H) 09/07/2021   Lab Results  Component Value Date   HGBA1C 5.8 (H) 09/07/2021      Assessment & Plan:    1. Essential hypertension -His blood pressure is under control and he will continue on current medication, DASH diet and exercise as tolerated. - amLODipine (NORVASC) 10 MG tablet; TAKE ONE TABLET BY MOUTH EVERY DAY  Dispense: 90 tablet; Refill: 0 - atenolol (TENORMIN) 50 MG tablet; TAKE 2 TABLETS (100MG TOTAL) BY MOUTH ONCE DAILY.  Dispense: 180 tablet; Refill: 0 -He was advised to reschedule his vascular surgery, cardiology and neurology appointments.  2. Hyperlipidemia, unspecified hyperlipidemia type -He will continue on current medication, low-fat/cholesterol diet and exercise as tolerated. - atorvastatin (LIPITOR) 80 MG tablet; TAKE ONE TABLET BY MOUTH AT BEDTIME  Dispense: 90 tablet; Refill:  0  3. Cerebrovascular accident (CVA), unspecified mechanism ([place]) -He will continue on current medication, was advised to notify clinic and go to the emergency room for hematuria, hematochezia and active bleeding. - clopidogrel (PLAVIX) 75 MG tablet; TAKE ONE TABLET BY MOUTH ONCE EVERY DAY.  Dispense: 90 tablet; Refill: 0  4. Cough, unspecified type -He was started on Tessalon Perles for cough and was advised to take OTC Robitussin during the day.  He was educated on medication side effects and advised to notify clinic. - benzonatate (TESSALON PERLES) 100 MG capsule; Take 1 capsule (100 mg total) by mouth 3 (three) times daily as needed for cough.  Dispense: 20 capsule; Refill: 0    Follow-up: Return in about 13 weeks (around 02/22/2022), or if symptoms worsen or fail to improve.    Shainna Faux Jerold Coombe, NP

## 2021-11-24 ENCOUNTER — Other Ambulatory Visit: Payer: Self-pay

## 2021-11-27 ENCOUNTER — Other Ambulatory Visit: Payer: Self-pay

## 2021-12-04 ENCOUNTER — Other Ambulatory Visit: Payer: Self-pay

## 2021-12-08 ENCOUNTER — Ambulatory Visit: Payer: Self-pay | Admitting: Neurology

## 2021-12-14 ENCOUNTER — Ambulatory Visit: Payer: Self-pay | Admitting: Gerontology

## 2021-12-15 ENCOUNTER — Other Ambulatory Visit: Payer: Self-pay

## 2021-12-26 ENCOUNTER — Ambulatory Visit: Payer: Self-pay | Admitting: Rheumatology

## 2022-01-22 ENCOUNTER — Other Ambulatory Visit: Payer: Self-pay

## 2022-02-08 ENCOUNTER — Other Ambulatory Visit: Payer: Self-pay

## 2022-02-13 ENCOUNTER — Other Ambulatory Visit: Payer: Self-pay | Admitting: Gerontology

## 2022-02-13 ENCOUNTER — Other Ambulatory Visit: Payer: Self-pay

## 2022-02-13 DIAGNOSIS — K219 Gastro-esophageal reflux disease without esophagitis: Secondary | ICD-10-CM

## 2022-02-13 MED FILL — Omeprazole Cap Delayed Release 20 MG: ORAL | 16 days supply | Qty: 32 | Fill #0 | Status: AC

## 2022-02-14 ENCOUNTER — Other Ambulatory Visit: Payer: Self-pay

## 2022-02-22 ENCOUNTER — Telehealth: Payer: Self-pay

## 2022-02-22 ENCOUNTER — Other Ambulatory Visit: Payer: Self-pay

## 2022-02-22 ENCOUNTER — Ambulatory Visit: Payer: Self-pay | Admitting: Adult Health

## 2022-02-22 ENCOUNTER — Encounter: Payer: Self-pay | Admitting: Gerontology

## 2022-02-22 VITALS — BP 158/94 | HR 62 | Temp 97.8°F | Resp 16 | Ht 71.0 in | Wt 218.1 lb

## 2022-02-22 DIAGNOSIS — Z0000 Encounter for general adult medical examination without abnormal findings: Secondary | ICD-10-CM

## 2022-02-22 DIAGNOSIS — R748 Abnormal levels of other serum enzymes: Secondary | ICD-10-CM

## 2022-02-22 DIAGNOSIS — Z720 Tobacco use: Secondary | ICD-10-CM

## 2022-02-22 DIAGNOSIS — N5201 Erectile dysfunction due to arterial insufficiency: Secondary | ICD-10-CM

## 2022-02-22 DIAGNOSIS — R7303 Prediabetes: Secondary | ICD-10-CM

## 2022-02-22 DIAGNOSIS — H538 Other visual disturbances: Secondary | ICD-10-CM

## 2022-02-22 DIAGNOSIS — G40209 Localization-related (focal) (partial) symptomatic epilepsy and epileptic syndromes with complex partial seizures, not intractable, without status epilepticus: Secondary | ICD-10-CM

## 2022-02-22 DIAGNOSIS — E785 Hyperlipidemia, unspecified: Secondary | ICD-10-CM

## 2022-02-22 DIAGNOSIS — F1010 Alcohol abuse, uncomplicated: Secondary | ICD-10-CM

## 2022-02-22 DIAGNOSIS — I639 Cerebral infarction, unspecified: Secondary | ICD-10-CM

## 2022-02-22 DIAGNOSIS — I1310 Hypertensive heart and chronic kidney disease without heart failure, with stage 1 through stage 4 chronic kidney disease, or unspecified chronic kidney disease: Secondary | ICD-10-CM

## 2022-02-22 LAB — POCT GLYCOSYLATED HEMOGLOBIN (HGB A1C): Hemoglobin A1C: 5.5 % (ref 4.0–5.6)

## 2022-02-22 LAB — GLUCOSE, POCT (MANUAL RESULT ENTRY): POC Glucose: 89 mg/dl (ref 70–99)

## 2022-02-22 MED ORDER — BLOOD GLUCOSE MONITOR KIT
PACK | 0 refills | Status: AC
  Filled 2022-02-22: qty 1, fill #0
  Filled 2022-02-22: qty 1, 30d supply, fill #0

## 2022-02-22 MED ORDER — RIGHTEST GS550 BLOOD GLUCOSE VI STRP
ORAL_STRIP | 0 refills | Status: AC
  Filled 2022-02-22: qty 100, 100d supply, fill #0

## 2022-02-22 MED ORDER — LEVETIRACETAM 1000 MG PO TABS
2000.0000 mg | ORAL_TABLET | Freq: Two times a day (BID) | ORAL | 3 refills | Status: DC
  Filled 2022-02-22: qty 360, 90d supply, fill #0
  Filled 2022-02-22: qty 90, 23d supply, fill #0
  Filled 2022-04-19: qty 100, 25d supply, fill #0
  Filled 2022-04-19: qty 20, 5d supply, fill #0
  Filled 2022-06-04: qty 120, 30d supply, fill #1
  Filled 2022-07-19: qty 120, 30d supply, fill #2
  Filled 2022-08-13: qty 120, 30d supply, fill #3
  Filled 2022-09-21: qty 120, 30d supply, fill #4
  Filled 2022-11-05 (×3): qty 120, 30d supply, fill #5
  Filled 2023-01-01: qty 120, 30d supply, fill #6

## 2022-02-22 MED ORDER — AMLODIPINE BESYLATE 5 MG PO TABS
10.0000 mg | ORAL_TABLET | Freq: Every day | ORAL | 2 refills | Status: DC
  Filled 2022-02-22: qty 180, 90d supply, fill #0
  Filled 2022-02-22: qty 90, 90d supply, fill #0

## 2022-02-22 MED ORDER — CARVEDILOL 25 MG PO TABS
25.0000 mg | ORAL_TABLET | Freq: Two times a day (BID) | ORAL | 2 refills | Status: DC
  Filled 2022-02-22 (×2): qty 180, 90d supply, fill #0

## 2022-02-22 MED ORDER — TADALAFIL 20 MG PO TABS
20.0000 mg | ORAL_TABLET | Freq: Every day | ORAL | 1 refills | Status: DC | PRN
  Filled 2022-02-22: qty 10, 10d supply, fill #0
  Filled 2022-02-22 – 2022-04-19 (×2): qty 10, 30d supply, fill #0

## 2022-02-22 MED ORDER — HYDROCHLOROTHIAZIDE 12.5 MG PO CAPS
12.5000 mg | ORAL_CAPSULE | Freq: Every day | ORAL | 2 refills | Status: DC
  Filled 2022-02-22 (×2): qty 90, 90d supply, fill #0

## 2022-02-22 MED ORDER — RIGHTEST GL300 LANCETS MISC
0 refills | Status: AC
  Filled 2022-02-22: qty 100, 100d supply, fill #0

## 2022-02-22 NOTE — Patient Instructions (Signed)
Tadalafil Tablets (Erectile Dysfunction, BPH) What is this medication? TADALAFIL (tah DA la fil) treats erectile dysfunction (ED). It works by increasing blood flow to the penis, which helps to maintain an erection. It may also be used to treat symptoms of an enlarged prostate (benign prostatic hyperplasia). This medicine may be used for other purposes; ask your health care provider or pharmacist if you have questions. COMMON BRAND NAME(S): Kathaleen Bury, Cialis What should I tell my care team before I take this medication? They need to know if you have any of these conditions: Anatomical deformation of the penis, Peyronie's disease, or history of priapism (painful and prolonged erection) Bleeding disorders Eye or vision problems, including a rare inherited eye disease called retinitis pigmentosa Heart disease, angina, a history of heart attack, irregular heart beats, or other heart problems High or low blood pressure History of blood diseases, like sickle cell anemia or leukemia History of stomach bleeding Kidney disease Liver disease Stroke An unusual or allergic reaction to tadalafil, other medications, foods, dyes, or preservatives Pregnant or trying to get pregnant Breast-feeding How should I use this medication? Take this medication by mouth with a glass of water. Follow the directions on the prescription label. You may take this medication with or without meals. When this medication is used for erection problems, your care team may prescribe it to be taken once daily or as needed. If you are taking the medication as needed, you may be able to have sexual activity 30 minutes after taking it and for up to 36 hours after taking it. Whether you are taking the medication as needed or once daily, you should not take more than one dose per day. If you are taking this medication for symptoms of benign prostatic hyperplasia (BPH) or to treat both BPH and an erection problem, take the dose once  daily at about the same time each day. Do not take your medication more often than directed. Talk to your care team about the use of this medication in children. Special care may be needed. Overdosage: If you think you have taken too much of this medicine contact a poison control center or emergency room at once. NOTE: This medicine is only for you. Do not share this medicine with others. What if I miss a dose? If you are taking this medication as needed for erection problems, this does not apply. If you miss a dose while taking this medication once daily for an erection problem, benign prostatic hyperplasia, or both, take it as soon as you remember, but do not take more than one dose per day. What may interact with this medication? Do not take this medication with any of the following: Nitrates like amyl nitrite, isosorbide dinitrate, isosorbide mononitrate, nitroglycerin Other medications for erectile dysfunction like avanafil, sildenafil, vardenafil Other tadalafil products (Adcirca) Riociguat This medication may also interact with the following: Certain medications for high blood pressure Certain medications for the treatment of HIV infection or AIDS Certain medications used for fungal or yeast infections, like fluconazole, itraconazole, ketoconazole, and voriconazole Certain medications used for seizures like carbamazepine, phenytoin, and phenobarbital Grapefruit juice Macrolide antibiotics like clarithromycin, erythromycin, troleandomycin Medications for prostate problems Rifabutin, rifampin or rifapentine This list may not describe all possible interactions. Give your health care provider a list of all the medicines, herbs, non-prescription drugs, or dietary supplements you use. Also tell them if you smoke, drink alcohol, or use illegal drugs. Some items may interact with your medicine. What should I watch for  while using this medication? If you notice any changes in your vision while  taking this medication, call your care team as soon as possible. Stop using this medication and call your care team right away if you have a loss of sight in one or both eyes. Contact your care team right away if the erection lasts longer than 4 hours or if it becomes painful. This may be a sign of serious problem and must be treated right away to prevent permanent damage. If you experience symptoms of nausea, dizziness, chest pain or arm pain upon initiation of sexual activity after taking this medication, you should refrain from further activity and call your care team as soon as possible. Do not drink alcohol to excess (examples, 5 glasses of wine or 5 shots of whiskey) when taking this medication. When taken in excess, alcohol can increase your chances of getting a headache or getting dizzy, increasing your heart rate or lowering your blood pressure. Using this medication does not protect you or your partner against HIV infection (the virus that causes AIDS) or other sexually transmitted diseases. What side effects may I notice from receiving this medication? Side effects that you should report to your care team as soon as possible: Allergic reactions--skin rash, itching, hives, swelling of the face, lips, tongue, or throat Hearing loss or ringing in ears Heart attack--pain or tightness in the chest, shoulders, arms, or jaw, nausea, shortness of breath, cold or clammy skin, feeling faint or lightheaded Low blood pressure--dizziness, feeling faint or lightheaded, blurry vision Prolonged or painful erection Redness, blistering, peeling, or loosening of the skin, including inside the mouth Stroke--sudden numbness or weakness of the face, arm, or leg, trouble speaking, confusion, trouble walking, loss of balance or coordination, dizziness, severe headache, change in vision Sudden vision loss in one or both eyes Side effects that usually do not require medical attention (report to your care team if  they continue or are bothersome): Back pain Facial flushing or redness Headache Muscle pain Runny or stuffy nose Upset stomach This list may not describe all possible side effects. Call your doctor for medical advice about side effects. You may report side effects to FDA at 1-800-FDA-1088. Where should I keep my medication? Keep out of the reach of children. Store at room temperature between 15 and 30 degrees C (59 and 86 degrees F). Throw away any unused medication after the expiration date. NOTE: This sheet is a summary. It may not cover all possible information. If you have questions about this medicine, talk to your doctor, pharmacist, or health care provider.  2023 Elsevier/Gold Standard (2020-12-08 00:00:00)

## 2022-02-22 NOTE — Progress Notes (Signed)
Patient: Gary [name] Male    DOB: Apr 04, 1971   51 y.o.   MRN: [id number] Visit Date: 02/22/2022  Today's Provider: Deforest Hoyles, NP   Chief Complaint  Patient presents with   Follow-up  HTN, prediabetes,  hyperlipidemia and seizure disorder 2/2 CVA. Subjective:    HPI: This is a a 51 y/o who presents for routine f/u of hypertension, hyperlipidemia, seizure disorder, and prediabetes. He reports doing well except for blurred vision. He does not does not check his blood glucose routinely.  His last hemoglobin A1c was below 6.  He is taking his metformin as prescribed.  He reports no GI side effects. HgA1C today is 5.5.  He checks his blood pressure at least twice a week.  He states that some days he had really high readings. His highest BP readings were in the 160s. He is not routinely exercising but works in the [place] and hence on his feet all the time. He continues to smoke. Smokes 1 pck in 3 days. He drinks about 3 drinks a day/everyday. He drinks diluted liquor. Reports blurred vision. Hasn't seen an ophthalmologist. Reports a mild headache that occurs every day. Reports erectile dysfunction that has gotten worse after the last stroke he had in March 2022.  Denies any breakthrough STAT seizures.  He is on Keppra 2 g twice a day.  Patient has had multiple strokes with minimal residual effects.  He denies chest pain, palpitations, dyspnea and dyspnea with exertion, and lower extremity edema.    No Known Allergies Previous Medications   ACETAMINOPHEN (TYLENOL) 500 MG TABLET    Take 500 mg by mouth every 6 (six) hours as needed. Once a day for headache/pain   AMLODIPINE (NORVASC) 5 MG TABLET    TAKE 2 TABLETS (10MG TOTAL) BY MOUTH ONCE DAILY.   ASPIRIN 81 MG TABLET    Take 1 tablet (81 mg total) by mouth daily.   ATENOLOL (TENORMIN) 50 MG TABLET    TAKE 2 TABLETS (100MG TOTAL) BY MOUTH ONCE DAILY.   ATORVASTATIN (LIPITOR) 80 MG TABLET    TAKE ONE TABLET BY MOUTH ONCE  DAILY AT BEDTIME.   CLOPIDOGREL (PLAVIX) 75 MG TABLET    TAKE ONE TABLET BY MOUTH ONCE EVERY DAY.   LEVETIRACETAM (KEPPRA) 1000 MG TABLET    Take 2 tablets (2054m total) by mouth twice daily.   LOSARTAN (COZAAR) 100 MG TABLET    Take 1 tablet (100 mg total) by mouth every evening.   METFORMIN (GLUCOPHAGE) 1000 MG TABLET    Take (1/2) tablet (500 mg total) by mouth once daily with breakfast.   OMEPRAZOLE (PRILOSEC) 20 MG CAPSULE    Take 1 capsule (20 mg total) by mouth in the morning and at bedtime.    Review of Systems  Constitutional: Negative.   HENT: Negative.    Eyes:  Positive for visual disturbance (Blurred vision).  Respiratory:  Positive for cough (Chronic cough). Negative for shortness of breath and wheezing.   Gastrointestinal: Negative.   Endocrine: Negative.   Genitourinary:        Reports difficulty initiating and sustaining an erection  Musculoskeletal: Negative.   Skin: Negative.   Allergic/Immunologic: Negative.   Neurological: Negative.   Psychiatric/Behavioral: Negative.     Social History   Tobacco Use   Smoking status: Every Day    Packs/day: 0.25    Years: 32.00    Pack years: 8.00    Types: Cigarettes   Smokeless tobacco: Never   Tobacco comments:  Pt reports he has thoughts of quitting.   Substance Use Topics   Alcohol use: Yes    Alcohol/week: 9.0 standard drinks    Types: 9 Standard drinks or equivalent per week    Comment: 3-4 times per week   Objective:   BP (!) 158/94 (BP Location: Left Arm, Patient Position: Sitting, Cuff Size: Large)   Pulse 62   Temp 97.8 F (36.6 C) (Oral)   Resp 16   Ht 5' 11"  (1.803 m)   Wt 218 lb 1.6 oz (98.9 kg)   SpO2 97%   BMI 30.42 kg/m   Physical Exam Vitals and nursing note reviewed.  Constitutional:      Appearance: Normal appearance.  HENT:     Head: Normocephalic and atraumatic.     Nose: Nose normal.     Mouth/Throat:     Mouth: Mucous membranes are moist.  Eyes:     Extraocular Movements:  Extraocular movements intact.     Conjunctiva/sclera: Conjunctivae normal.     Pupils: Pupils are equal, round, and reactive to light.  Cardiovascular:     Rate and Rhythm: Normal rate and regular rhythm.     Pulses: Normal pulses.     Heart sounds: Normal heart sounds.  Abdominal:     General: Abdomen is flat. Bowel sounds are normal.     Palpations: Abdomen is soft.  Genitourinary:    Comments: Deferred per patient's request Musculoskeletal:        General: No swelling or tenderness. Normal range of motion.     Cervical back: Normal range of motion.     Right lower leg: No edema.     Left lower leg: No edema.  Skin:    General: Skin is warm and dry.     Capillary Refill: Capillary refill takes less than 2 seconds.  Neurological:     General: No focal deficit present.     Mental Status: He is alert and oriented to person, place, and time.  Psychiatric:        Mood and Affect: Mood normal.        Behavior: Behavior normal.        Assessment & Plan:  1. Localization-related (focal) (partial) symptomatic epilepsy and epileptic syndromes with complex partial seizures, not intractable, without status epilepticus ([place]) Well-controlled on current medications.  Continue current dose.  Seizure precautions reviewed with patient. - levETIRAcetam (KEPPRA) 1000 MG tablet; Take 2 tablets (2,000 mg total) by mouth twice daily.  Dispense: 360 tablet; Refill: 3  2. Prediabetes Hemoglobin A1c normal.  Patient is currently on metformin.  Continue 500 mg daily. - POCT Glucose (CBG) - POCT HgB A1C  3. Blurred vision, bilateral Patient has had a routine eye exam.  Will refer to outpatient ophthalmology for evaluation - Ambulatory referral to Ophthalmology  4. Cerebrovascular accident (CVA), unspecified mechanism ([place]) We will continue aggressive risk factor management.  Patient advised to continue statin, aspirin and Plavix as ordered.  Follow-up with neurology as scheduled.  5.  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  He is at increased risk for cardiovascular events.  We will adjust his medications as follows: - Discontinue atenolol in light of erectile dysfunction - Start Coreg 25 mg twice daily - Start low-dose hydrochlorothiazide 12.5 mg daily - Increase Norvasc to 10 mg daily - Continue losartan 100 mg at bedtime We will check kidney function today Patient is to monitor his blood pressure twice a  day and keep a log of the readings and communicate the log to the clinic in 1 week.  He is to return to the clinic for reevaluation of his blood pressure in 4 weeks - CBC w/Diff - Comp Met (CMET)  6. Hyperlipidemia, unspecified hyperlipidemia type Will check lipid panel today.  Continue high-dose statin - Lipid Profile  7. Erectile dysfunction due to arterial insufficiency -Patient has multiple risk factors for erectile dysfunction.  The need for smoking cessation reviewed at length with patient.  He has promised to make an effort to stop smoking.  We will provide referral to urology as well as a 1 month as needed dose of Cialis.  8. Tobacco abuse Need for smoking cessation reviewed at length with patient.  Referral provided - Ambulatory referral to Smoking Cessation Program  9. Alcohol abuse -Patient advised to cut back on his alcohol.  The health effects of his alcohol use reviewed at length with him  10. Elevated liver enzymes Due to alcohol consumption and abuse.  We will repeat LFTs today  11. Health maintenance examination Patient is due for colonoscopy and a check of his PSA.  PSA ordered.  Referral to gastroenterology for screening colonoscopy provided. - PSA - Ambulatory referral to Gastroenterology   Return to clinic in 4 weeks or as needed  Deforest Hoyles, NP   [facility]

## 2022-02-22 NOTE — Telephone Encounter (Signed)
CALLED NO ANSWER NO VOICEMAIL

## 2022-02-23 LAB — COMPREHENSIVE METABOLIC PANEL
ALT: 119 IU/L — ABNORMAL HIGH (ref 0–44)
AST: 131 IU/L — ABNORMAL HIGH (ref 0–40)
Albumin/Globulin Ratio: 1.3 (ref 1.2–2.2)
Albumin: 4.4 g/dL (ref 4.0–5.0)
Alkaline Phosphatase: 124 IU/L — ABNORMAL HIGH (ref 44–121)
BUN/Creatinine Ratio: 12 (ref 9–20)
BUN: 14 mg/dL (ref 6–24)
Bilirubin Total: 0.5 mg/dL (ref 0.0–1.2)
CO2: 22 mmol/L (ref 20–29)
Calcium: 9.4 mg/dL (ref 8.7–10.2)
Chloride: 104 mmol/L (ref 96–106)
Creatinine, Ser: 1.19 mg/dL (ref 0.76–1.27)
Globulin, Total: 3.4 g/dL (ref 1.5–4.5)
Glucose: 97 mg/dL (ref 70–99)
Potassium: 4.3 mmol/L (ref 3.5–5.2)
Sodium: 142 mmol/L (ref 134–144)
Total Protein: 7.8 g/dL (ref 6.0–8.5)
eGFR: 74 mL/min/{1.73_m2} (ref >59)

## 2022-02-23 LAB — LIPID PANEL
Chol/HDL Ratio: 5.9 ratio — ABNORMAL HIGH (ref 0.0–5.0)
Cholesterol, Total: 231 mg/dL — ABNORMAL HIGH (ref 100–199)
HDL: 39 mg/dL — ABNORMAL LOW (ref >39)
LDL Chol Calc (NIH): 166 mg/dL — ABNORMAL HIGH (ref 0–99)
Triglycerides: 142 mg/dL (ref 0–149)
VLDL Cholesterol Cal: 26 mg/dL (ref 5–40)

## 2022-02-23 LAB — PSA: Prostate Specific Ag, Serum: 1 ng/mL (ref 0.0–4.0)

## 2022-02-23 LAB — CBC WITH DIFFERENTIAL/PLATELET
Basophils Absolute: 0 10*3/uL (ref 0.0–0.2)
Basos: 0 %
EOS (ABSOLUTE): 0 10*3/uL (ref 0.0–0.4)
Eos: 1 %
Hematocrit: 37.9 % (ref 37.5–51.0)
Hemoglobin: 13.4 g/dL (ref 13.0–17.7)
Immature Grans (Abs): 0 10*3/uL (ref 0.0–0.1)
Immature Granulocytes: 0 %
Lymphocytes Absolute: 3 10*3/uL (ref 0.7–3.1)
Lymphs: 36 %
MCH: 33.4 pg — ABNORMAL HIGH (ref 26.6–33.0)
MCHC: 35.4 g/dL (ref 31.5–35.7)
MCV: 95 fL (ref 79–97)
Monocytes Absolute: 0.7 10*3/uL (ref 0.1–0.9)
Monocytes: 8 %
Neutrophils Absolute: 4.6 10*3/uL (ref 1.4–7.0)
Neutrophils: 55 %
Platelets: 244 10*3/uL (ref 150–450)
RBC: 4.01 x10E6/uL — ABNORMAL LOW (ref 4.14–5.80)
RDW: 11.2 % — ABNORMAL LOW (ref 11.6–15.4)
WBC: 8.3 10*3/uL (ref 3.4–10.8)

## 2022-02-26 ENCOUNTER — Other Ambulatory Visit: Payer: Self-pay

## 2022-02-26 DIAGNOSIS — Z1211 Encounter for screening for malignant neoplasm of colon: Secondary | ICD-10-CM

## 2022-02-26 MED ORDER — NA SULFATE-K SULFATE-MG SULF 17.5-3.13-1.6 GM/177ML PO SOLN
1.0000 | Freq: Once | ORAL | 0 refills | Status: AC
  Filled 2022-02-26: qty 354, fill #0
  Filled 2022-02-26: qty 354, 1d supply, fill #0

## 2022-02-26 NOTE — Progress Notes (Signed)
Gastroenterology Pre-Procedure Review  Request Date: 03/08/2022 Requesting Physician: Dr. Vicente Males  PATIENT REVIEW QUESTIONS: The patient responded to the following health history questions as indicated:    1. Are you having any GI issues? no 2. Do you have a personal history of Polyps? no 3. Do you have a family history of Colon Cancer or Polyps? no 4. Diabetes Mellitus? no 5. Joint replacements in the past 12 months?no 6. Major health problems in the past 3 months?no 7. Any artificial heart valves, MVP, or defibrillator?no    MEDICATIONS & ALLERGIES:    Patient reports the following regarding taking any anticoagulation/antiplatelet therapy:   Plavix, Coumadin, Eliquis, Xarelto, Lovenox, Pradaxa, Brilinta, or Effient? yes (plavix) Aspirin? yes (39m)  Patient confirms/reports the following medications:  Current Outpatient Medications  Medication Sig Dispense Refill   acetaminophen (TYLENOL) 500 MG tablet Take 500 mg by mouth every 6 (six) hours as needed. Once a day for headache/pain     amLODipine (NORVASC) 5 MG tablet Take 2 tablets (10 mg total) by mouth once daily. 180 tablet 2   aspirin 81 MG tablet Take 1 tablet (81 mg total) by mouth daily. 30 tablet 0   atorvastatin (LIPITOR) 80 MG tablet TAKE ONE TABLET BY MOUTH ONCE NIGHTLY AT BEDTIME. 90 tablet 0   blood glucose meter kit and supplies KIT Use as directed to check blood glucose 2-3 times per week and as needed 1 each 0   carvedilol (COREG) 25 MG tablet Take 1 tablet (25 mg total) by mouth 2 (two) times daily with a meal. 180 tablet 2   clopidogrel (PLAVIX) 75 MG tablet TAKE ONE TABLET BY MOUTH ONCE EVERY DAY. 90 tablet 0   glucose blood (RIGHTEST GS550 BLOOD GLUCOSE) test strip Use as directed to check blood glucose 2-3 times per week and as needed 100 each 0   hydrochlorothiazide (MICROZIDE) 12.5 MG capsule Take 1 capsule (12.5 mg total) by mouth once daily. 90 capsule 2   levETIRAcetam (KEPPRA) 1000 MG tablet Take 2 tablets  (2,000 mg total) by mouth twice daily. 360 tablet 3   losartan (COZAAR) 100 MG tablet Take 1 tablet (100 mg total) by mouth every evening. 90 tablet 2   metFORMIN (GLUCOPHAGE) 1000 MG tablet Take (1/2) tablet (500 mg total) by mouth once daily with breakfast. 30 tablet 2   omeprazole (PRILOSEC) 20 MG capsule Take 1 capsule (20 mg total) by mouth in the morning and at bedtime. 120 capsule 0   Rightest GL300 Lancets MISC Use as directed to check blood glucose 2-3 times per week and as needed 100 each 0   tadalafil (CIALIS) 20 MG tablet Take 1 tablet (20 mg total) by mouth once daily as needed for erectile dysfunction. 10 tablet 1   No current facility-administered medications for this visit.    Patient confirms/reports the following allergies:  No Known Allergies  No orders of the defined types were placed in this encounter.   AUTHORIZATION INFORMATION Primary Insurance: 1D#: Group #:  Secondary Insurance: 1D#: Group #:  SCHEDULE INFORMATION: Date: 03/08/2022 Time: Location:[facility]

## 2022-02-27 ENCOUNTER — Ambulatory Visit: Payer: Self-pay | Admitting: Rheumatology

## 2022-03-02 ENCOUNTER — Other Ambulatory Visit: Payer: Self-pay

## 2022-03-08 ENCOUNTER — Ambulatory Visit: Admission: RE | Admit: 2022-03-08 | Payer: Self-pay | Source: Home / Self Care | Admitting: Gastroenterology

## 2022-03-08 ENCOUNTER — Encounter: Admission: RE | Payer: Self-pay | Source: Home / Self Care

## 2022-03-08 SURGERY — COLONOSCOPY WITH PROPOFOL
Anesthesia: General

## 2022-03-09 ENCOUNTER — Telehealth: Payer: Self-pay

## 2022-03-09 NOTE — Telephone Encounter (Signed)
Called to reschedule procedure patient does not want to reschedule he will call us once he gets insurance again

## 2022-03-19 ENCOUNTER — Other Ambulatory Visit: Payer: Self-pay

## 2022-03-19 MED FILL — Omeprazole Cap Delayed Release 20 MG: ORAL | 30 days supply | Qty: 60 | Fill #0 | Status: AC

## 2022-03-20 ENCOUNTER — Other Ambulatory Visit: Payer: Self-pay

## 2022-03-22 ENCOUNTER — Ambulatory Visit: Payer: Self-pay | Admitting: Gerontology

## 2022-03-22 ENCOUNTER — Ambulatory Visit: Payer: Self-pay | Admitting: Urology

## 2022-03-22 NOTE — Progress Notes (Incomplete)
03/22/22 6:18 AM   Gary [name] 27-Oct-1970 [id number]  Referring provider:  Erlene Quan, NP [address] [place],  [place] [address] No chief complaint on file.     HPI:a Gary [name] is a 51 y.o.male who presets today for further evaluation of erectile dysfunction.     He was seen by his PCP on 02/22/2022. He was noted to have ED and was started on a 1 month as needed dose of Cialis.     PMH: Past Medical History:  Diagnosis Date   Alcohol use    Carotid artery occlusion    GERD (gastroesophageal reflux disease)    Hypercholesteremia    Hypertension    Seizures ([place])    Stroke ([place])    TIA 12/29/20, re-admitted 01/14/21 for sequelae   Tobacco abuse     Surgical History: Past Surgical History:  Procedure Laterality Date   NO PAST SURGERIES      Home Medications:  Allergies as of 03/22/2022   No Known Allergies      Medication List        Accurate as of March 22, 2022  6:18 AM. If you have any questions, ask your nurse or doctor.          acetaminophen 500 MG tablet Commonly known as: TYLENOL Take 500 mg by mouth every 6 (six) hours as needed. Once a day for headache/pain   amLODipine 5 MG tablet Commonly known as: NORVASC Take 2 tablets (10 mg total) by mouth once daily.   aspirin 81 MG tablet Take 1 tablet (81 mg total) by mouth daily.   atorvastatin 80 MG tablet Commonly known as: LIPITOR TAKE ONE TABLET BY MOUTH ONCE NIGHTLY AT BEDTIME.   carvedilol 25 MG tablet Commonly known as: COREG Take 1 tablet (25 mg total) by mouth 2 (two) times daily with a meal.   clopidogrel 75 MG tablet Commonly known as: PLAVIX TAKE ONE TABLET BY MOUTH ONCE EVERY DAY.   hydrochlorothiazide 12.5 MG capsule Commonly known as: MICROZIDE Take 1 capsule (12.5 mg total) by mouth once daily.   levETIRAcetam 1000 MG tablet Commonly known as: KEPPRA Take 2 tablets (2,000 mg total) by mouth twice daily.   losartan 100 MG  tablet Commonly known as: Cozaar Take 1 tablet (100 mg total) by mouth every evening.   metFORMIN 1000 MG tablet Commonly known as: GLUCOPHAGE Take (1/2) tablet (500 mg total) by mouth once daily with breakfast.   omeprazole 20 MG capsule Commonly known as: PRILOSEC Take 1 capsule (20 mg total) by mouth in the morning and at bedtime.   Rightest GL300 Lancets Misc Use as directed to check blood glucose 2-3 times per week and as needed   Rightest GM550 Blood Glucose w/Device Kit Use as directed to check blood glucose 2-3 times per week and as needed   Rightest GS550 Blood Glucose test strip Generic drug: glucose blood Use as directed to check blood glucose 2-3 times per week and as needed   tadalafil 20 MG tablet Commonly known as: Cialis Take 1 tablet (20 mg total) by mouth once daily as needed for erectile dysfunction.        Allergies:  No Known Allergies  Family History: Family History  Problem Relation Age of Onset   Hypertension Mother        pt reports mother has stent   Other Father        unknown medical history   Healthy Sister  Social History:  reports that he has been smoking cigarettes. He has a 8.00 pack-year smoking history. He has never used smokeless tobacco. He reports current alcohol use of about 9.0 standard drinks of alcohol per week. He reports that he does not use drugs.   Physical Exam: There were no vitals taken for this visit.  Constitutional:  Alert and oriented, No acute distress. HEENT: [place] AT, moist mucus membranes.  Trachea midline, no masses. Cardiovascular: No clubbing, cyanosis, or edema. Respiratory: Normal respiratory effort, no increased work of breathing. Skin: No rashes, bruises or suspicious lesions. Neurologic: Grossly intact, no focal deficits, moving all 4 extremities. Psychiatric: Normal mood and affect.  Laboratory Data:  Lab Results  Component Value Date   CREATININE 1.19 02/22/2022   Lab Results  Component  Value Date   HGBA1C 5.5 02/22/2022    Urinalysis   Pertinent Imaging:    Assessment & Plan:     No follow-ups on file.  I,Kailey Littlejohn,acting as a Education administrator for Hollice Espy, MD.,have documented all relevant documentation on the behalf of Hollice Espy, MD,as directed by  Hollice Espy, MD while in the presence of Hollice Espy, [place] [address], [place], [place] [address] [phone]

## 2022-03-30 ENCOUNTER — Encounter: Payer: Self-pay | Admitting: Urology

## 2022-04-19 ENCOUNTER — Other Ambulatory Visit: Payer: Self-pay

## 2022-04-20 ENCOUNTER — Other Ambulatory Visit: Payer: Self-pay

## 2022-05-04 ENCOUNTER — Other Ambulatory Visit: Payer: Self-pay

## 2022-05-08 ENCOUNTER — Other Ambulatory Visit: Payer: Self-pay

## 2022-05-08 MED FILL — Omeprazole Cap Delayed Release 20 MG: ORAL | 14 days supply | Qty: 28 | Fill #1 | Status: AC

## 2022-05-10 ENCOUNTER — Other Ambulatory Visit: Payer: Self-pay

## 2022-05-10 ENCOUNTER — Other Ambulatory Visit: Payer: Self-pay | Admitting: Gerontology

## 2022-05-17 ENCOUNTER — Ambulatory Visit: Payer: Self-pay | Admitting: Gerontology

## 2022-05-30 ENCOUNTER — Other Ambulatory Visit: Payer: Self-pay | Admitting: Gerontology

## 2022-05-30 ENCOUNTER — Other Ambulatory Visit: Payer: Self-pay

## 2022-05-30 DIAGNOSIS — K219 Gastro-esophageal reflux disease without esophagitis: Secondary | ICD-10-CM

## 2022-05-31 ENCOUNTER — Other Ambulatory Visit: Payer: Self-pay

## 2022-06-01 ENCOUNTER — Other Ambulatory Visit: Payer: Self-pay | Admitting: Gerontology

## 2022-06-01 ENCOUNTER — Other Ambulatory Visit: Payer: Self-pay

## 2022-06-01 DIAGNOSIS — K219 Gastro-esophageal reflux disease without esophagitis: Secondary | ICD-10-CM

## 2022-06-04 ENCOUNTER — Other Ambulatory Visit: Payer: Self-pay

## 2022-06-05 ENCOUNTER — Other Ambulatory Visit: Payer: Self-pay

## 2022-06-07 ENCOUNTER — Ambulatory Visit: Payer: Self-pay | Admitting: Gerontology

## 2022-06-07 ENCOUNTER — Other Ambulatory Visit: Payer: Self-pay

## 2022-06-07 ENCOUNTER — Encounter: Payer: Self-pay | Admitting: Gerontology

## 2022-06-07 VITALS — BP 150/97 | HR 93 | Temp 98.1°F | Resp 18 | Ht 71.0 in | Wt 215.6 lb

## 2022-06-07 DIAGNOSIS — Z720 Tobacco use: Secondary | ICD-10-CM

## 2022-06-07 DIAGNOSIS — K219 Gastro-esophageal reflux disease without esophagitis: Secondary | ICD-10-CM

## 2022-06-07 DIAGNOSIS — N529 Male erectile dysfunction, unspecified: Secondary | ICD-10-CM

## 2022-06-07 DIAGNOSIS — I639 Cerebral infarction, unspecified: Secondary | ICD-10-CM

## 2022-06-07 DIAGNOSIS — R748 Abnormal levels of other serum enzymes: Secondary | ICD-10-CM

## 2022-06-07 DIAGNOSIS — I10 Essential (primary) hypertension: Secondary | ICD-10-CM

## 2022-06-07 MED ORDER — CLOPIDOGREL BISULFATE 75 MG PO TABS
ORAL_TABLET | Freq: Every day | ORAL | 0 refills | Status: DC
  Filled 2022-06-07: qty 90, 90d supply, fill #0

## 2022-06-07 MED ORDER — CLOPIDOGREL BISULFATE 75 MG PO TABS
ORAL_TABLET | Freq: Every day | ORAL | 0 refills | Status: DC
  Filled 2022-06-07: qty 90, fill #0

## 2022-06-07 MED ORDER — LOSARTAN POTASSIUM 100 MG PO TABS
100.0000 mg | ORAL_TABLET | Freq: Every evening | ORAL | 1 refills | Status: DC
  Filled 2022-06-07: qty 90, 90d supply, fill #0

## 2022-06-07 MED ORDER — AMLODIPINE BESYLATE 10 MG PO TABS
10.0000 mg | ORAL_TABLET | Freq: Every day | ORAL | 1 refills | Status: DC
  Filled 2022-06-07: qty 90, 90d supply, fill #0

## 2022-06-07 MED ORDER — PANTOPRAZOLE SODIUM 40 MG PO TBEC
40.0000 mg | DELAYED_RELEASE_TABLET | Freq: Every day | ORAL | 1 refills | Status: DC
  Filled 2022-06-07: qty 20, 20d supply, fill #0
  Filled 2022-06-25: qty 30, 30d supply, fill #1

## 2022-06-07 NOTE — Progress Notes (Signed)
Established Patient Office Visit  Subjective   Patient ID: Gary [name], male    DOB: October 29, 1970  Age: 51 y.o. MRN: [id number]  Chief Complaint  Patient presents with   Follow-up    Wants to get prescriptions refilled.    HPI  This is a a 51 y/o who presents for routine f/u of hypertension, hyperlipidemia, seizure disorder, and prediabetes.  Labs done on 02/22/22, AST was 131, ALT was 119,  states that he cut back on alcohol consumption and he drinks 2 mixed drinks daily.  He denies jaundice, dark urine and right upper quadrant abdominal pain. His Atorvastatin was held because of elevated liver enzymes, LDL was 166 MG/DL. He smokes 1 pack of cigarette in 3 days and admits the desire to quit. He requests Cialis refill for erectile dysfunction and has not followed up with the Urologist. His blood pressure was elevated at 150/97, states that he's compliant with his medications and continues to work on his diet. Overall, he states that he's doing well and offers no further complaint.  Review of Systems  Constitutional: Negative.   Eyes: Negative.   Respiratory: Negative.    Cardiovascular: Negative.   Neurological: Negative.   Endo/Heme/Allergies: Negative.       Objective:     BP (!) 150/97 (BP Location: Left Arm, Patient Position: Sitting, Cuff Size: Large)   Pulse 93   Temp 98.1 F (36.7 C) (Oral)   Resp 18   Ht _0  (1.803 m)   Wt 215 lb 9.6 oz (97.8 kg)   SpO2 96%   BMI 30.07 kg/m  BP Readings from Last 3 Encounters:  06/07/22 (!) 150/97  02/22/22 (!) 158/94  11/23/21 134/81   Wt Readings from Last 3 Encounters:  06/07/22 215 lb 9.6 oz (97.8 kg)  02/22/22 218 lb 1.6 oz (98.9 kg)  11/23/21 219 lb 3.2 oz (99.4 kg)      Physical Exam HENT:     Head: Normocephalic and atraumatic.     Mouth/Throat:     Mouth: Mucous membranes are moist.  Eyes:     Extraocular Movements: Extraocular movements intact.     Conjunctiva/sclera: Conjunctivae normal.     Pupils:  Pupils are equal, round, and reactive to light.  Cardiovascular:     Rate and Rhythm: Normal rate and regular rhythm.     Pulses: Normal pulses.     Heart sounds: Normal heart sounds.  Pulmonary:     Effort: Pulmonary effort is normal.     Breath sounds: Normal breath sounds.  Skin:    General: Skin is warm.  Neurological:     General: No focal deficit present.     Mental Status: He is alert and oriented to person, place, and time. Mental status is at baseline.  Psychiatric:        Mood and Affect: Mood normal.        Behavior: Behavior normal.        Thought Content: Thought content normal.        Judgment: Judgment normal.      No results found for any visits on 06/07/22.  Last CBC Lab Results  Component Value Date   WBC 8.3 02/22/2022   HGB 13.4 02/22/2022   HCT 37.9 02/22/2022   MCV 95 02/22/2022   MCH 33.4 (H) 02/22/2022   RDW 11.2 (L) 02/22/2022   PLT 244 37/48/2707   Last metabolic panel Lab Results  Component Value Date   GLUCOSE 97 02/22/2022  NA 142 02/22/2022   K 4.3 02/22/2022   CL 104 02/22/2022   CO2 22 02/22/2022   BUN 14 02/22/2022   CREATININE 1.19 02/22/2022   EGFR 74 02/22/2022   CALCIUM 9.4 02/22/2022   PROT 7.8 02/22/2022   ALBUMIN 4.4 02/22/2022   LABGLOB 3.4 02/22/2022   AGRATIO 1.3 02/22/2022   BILITOT 0.5 02/22/2022   ALKPHOS 124 (H) 02/22/2022   AST 131 (H) 02/22/2022   ALT 119 (H) 02/22/2022   ANIONGAP 10 01/14/2021   Last lipids Lab Results  Component Value Date   CHOL 231 (H) 02/22/2022   HDL 39 (L) 02/22/2022   LDLCALC 166 (H) 02/22/2022   TRIG 142 02/22/2022   CHOLHDL 5.9 (H) 02/22/2022   Last hemoglobin A1c Lab Results  Component Value Date   HGBA1C 5.5 02/22/2022      The ASCVD Risk score (Arnett DK, et al., 2019) failed to calculate for the following reasons:   The patient has a prior MI or stroke diagnosis    Assessment & Plan:   1. Cerebrovascular accident (CVA), unspecified mechanism ([place]) - He will  continue on current medication, will recheck Lipids if elevated will refer to Cardiologist, held Atorvastatin due to elevated liver enzymes. - Lipid panel; Future - clopidogrel (PLAVIX) 75 MG tablet; TAKE ONE TABLET BY MOUTH ONCE EVERY DAY.  Dispense: 90 tablet; Refill: 0 - Lipid panel  2. Tobacco abuse -He was encouraged on smoking cessation  3. Elevated liver enzymes - His liver enzymes are elevated, will recheck. Encouraged alcohol abstinence . - Hepatic function panel  4. Essential hypertension - His blood pressure is not under control, his goal should be less than 140/90. He will continue on current medication, DASH diet and exercise as tolerated. - amLODipine (NORVASC) 10 MG tablet; Take 1 tablet (10 mg total) by mouth daily.  Dispense: 90 tablet; Refill: 1 - losartan (COZAAR) 100 MG tablet; Take 1 tablet (100 mg total) by mouth every evening.  Dispense: 90 tablet; Refill: 1  5. Gastroesophageal reflux disease, unspecified whether esophagitis present - Due to medication interaction, Prilosec was discontinued and was started on Protonix. Educated on medication side effects and advised to notify clinic. -Avoid spicy, fatty and fried food -Avoid sodas and sour juices -Avoid heavy meals -Avoid eating 4 hours before bedtime -Elevate head of bed at night - pantoprazole (PROTONIX) 40 MG tablet; Take 1 tablet (40 mg total) by mouth daily.  Dispense: 90 tablet; Refill: 1  6. Erectile dysfunction, unspecified erectile dysfunction type - He was encouraged to complete [facility] financial application for Ambulatory referral to Urology   Return in about 5 weeks (around 07/12/2022), or if symptoms worsen or fail to improve.    Jody Aguinaga Jerold Coombe, NP

## 2022-06-07 NOTE — Patient Instructions (Signed)

## 2022-06-08 LAB — HEPATIC FUNCTION PANEL
ALT: 94 IU/L — ABNORMAL HIGH (ref 0–44)
AST: 118 IU/L — ABNORMAL HIGH (ref 0–40)
Albumin: 4.4 g/dL (ref 4.1–5.1)
Alkaline Phosphatase: 121 IU/L (ref 44–121)
Bilirubin Total: 0.7 mg/dL (ref 0.0–1.2)
Bilirubin, Direct: 0.27 mg/dL (ref 0.00–0.40)
Total Protein: 7.7 g/dL (ref 6.0–8.5)

## 2022-06-08 LAB — LIPID PANEL
Chol/HDL Ratio: 6.4 ratio — ABNORMAL HIGH (ref 0.0–5.0)
Cholesterol, Total: 238 mg/dL — ABNORMAL HIGH (ref 100–199)
HDL: 37 mg/dL — ABNORMAL LOW (ref >39)
LDL Chol Calc (NIH): 176 mg/dL — ABNORMAL HIGH (ref 0–99)
Triglycerides: 138 mg/dL (ref 0–149)
VLDL Cholesterol Cal: 25 mg/dL (ref 5–40)

## 2022-06-14 ENCOUNTER — Other Ambulatory Visit: Payer: Self-pay

## 2022-06-14 ENCOUNTER — Ambulatory Visit (INDEPENDENT_AMBULATORY_CARE_PROVIDER_SITE_OTHER): Payer: Self-pay | Admitting: Urology

## 2022-06-14 ENCOUNTER — Encounter: Payer: Self-pay | Admitting: Urology

## 2022-06-14 VITALS — BP 172/102 | HR 101 | Ht 71.0 in | Wt 220.0 lb

## 2022-06-14 DIAGNOSIS — N5201 Erectile dysfunction due to arterial insufficiency: Secondary | ICD-10-CM

## 2022-06-14 MED ORDER — SILDENAFIL CITRATE 100 MG PO TABS
ORAL_TABLET | ORAL | 0 refills | Status: DC
  Filled 2022-06-14: qty 10, 30d supply, fill #0

## 2022-06-14 NOTE — Progress Notes (Signed)
Patient  06/14/2022 11:03 AM   Gary [name] March 22, 1971 [id number]  Referring provider: Langston Reusing, NP [id number] [place],  [place] [address]  Chief Complaint  Patient presents with   Erectile Dysfunction    HPI: Killian Schwer is a 51 y.o. 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  States he is able to obtain an erection firm enough for penetration approximately 50% of the time however has difficulty maintaining the erection. Has used Cialis which is effective on occasions but still with difficulty maintaining. SHIM 13/25 Organic risk factors include cerebrovascular disease, hypercholesterolemia, hypertension and antihypertensive medications Past history tobacco use No pain or curvature with erections   PMH: Past Medical History:  Diagnosis Date   Alcohol use    Carotid artery occlusion    GERD (gastroesophageal reflux disease)    Hypercholesteremia    Hypertension    Seizures ([place])    Stroke ([place])    TIA 12/29/20, re-admitted 01/14/21 for sequelae   Tobacco abuse     Surgical History: Past Surgical History:  Procedure Laterality Date   NO PAST SURGERIES      Home Medications:  Allergies as of 06/14/2022   No Known Allergies      Medication List        Accurate as of June 14, 2022 11:03 AM. If you have any questions, ask your nurse or doctor.          amLODipine 10 MG tablet Commonly known as: NORVASC Take 1 tablet (10 mg total) by mouth daily.   aspirin 81 MG tablet Take 1 tablet (81 mg total) by mouth daily.   atorvastatin 80 MG tablet Commonly known as: LIPITOR TAKE ONE TABLET BY MOUTH ONCE NIGHTLY AT BEDTIME.   carvedilol 25 MG tablet Commonly known as: COREG Take 1 tablet (25 mg total) by mouth 2 (two) times daily with a meal.   clopidogrel 75 MG tablet Commonly known as: PLAVIX TAKE ONE TABLET BY MOUTH  ONCE EVERY DAY.   hydrochlorothiazide 12.5 MG capsule Commonly known as: MICROZIDE Take 1 capsule (12.5 mg total) by mouth once daily.   levETIRAcetam 1000 MG tablet Commonly known as: KEPPRA Take 2 tablets (2,000 mg total) by mouth twice daily.   losartan 100 MG tablet Commonly known as: Cozaar Take 1 tablet (100 mg total) by mouth every evening.   pantoprazole 40 MG tablet Commonly known as: Protonix Take 1 tablet (40 mg total) by mouth daily.   Rightest GL300 Lancets Misc Use as directed to check blood glucose 2-3 times per week and as needed   Rightest GM550 Blood Glucose w/Device Kit Use as directed to check blood glucose 2-3 times per week and as needed   Rightest GS550 Blood Glucose test strip Generic drug: glucose blood Use as directed to check blood glucose 2-3 times per week and as needed   tadalafil 20 MG tablet Commonly known as: Cialis Take 1 tablet (20 mg total) by mouth once daily as needed for erectile dysfunction.        Allergies: No Known Allergies  Family History: Family History  Problem Relation Age of Onset   Hypertension Mother        pt reports mother has stent   Other Father        unknown medical history   Healthy Sister     Social History:  reports that  he has been smoking cigarettes. He has a 8.00 pack-year smoking history. He has never used smokeless tobacco. He reports current alcohol use of about 9.0 standard drinks of alcohol per week. He reports that he does not use drugs.   Physical Exam: BP (!) 172/102   Pulse (!) 101   Ht _0  (1.803 m)   Wt 220 lb (99.8 kg)   BMI 30.68 kg/m   Constitutional:  Alert and oriented, No acute distress. HEENT: [place] AT GU: Phallus without lesions/plaques.  Testes descended bilaterally without masses or tenderness.  Estimated volume ~ 20 cc bilaterally Skin: No rashes, bruises or suspicious lesions. Neurologic: Grossly intact, no focal deficits, moving all 4 extremities. Psychiatric: Normal  mood and affect.   Assessment & Plan:    1.  Erectile dysfunction Significant organic risk factors Tadalafil partially effective at 20 mg dose.  We discussed titrating the dose higher would not improve efficacy and may worsen side effects He was instructed to trial of sildenafil and Rx 100 mg tabs sent to pharmacy Second line options were discussed including intracavernosal injections and vacuum erection devices   Abbie Sons, MD  [facility] [address] [place], [place] [address] [phone]

## 2022-06-25 ENCOUNTER — Other Ambulatory Visit: Payer: Self-pay

## 2022-07-02 ENCOUNTER — Other Ambulatory Visit: Payer: Self-pay

## 2022-07-02 ENCOUNTER — Other Ambulatory Visit: Payer: Self-pay | Admitting: Urology

## 2022-07-02 MED FILL — Sildenafil Citrate Tab 100 MG: ORAL | 30 days supply | Qty: 10 | Fill #0 | Status: AC

## 2022-07-03 ENCOUNTER — Other Ambulatory Visit: Payer: Self-pay

## 2022-07-12 ENCOUNTER — Ambulatory Visit: Payer: Self-pay | Admitting: Adult Health

## 2022-07-12 ENCOUNTER — Other Ambulatory Visit: Payer: Self-pay

## 2022-07-12 ENCOUNTER — Encounter: Payer: Self-pay | Admitting: Gerontology

## 2022-07-12 VITALS — BP 170/103 | HR 71 | Temp 98.1°F | Wt 213.6 lb

## 2022-07-12 DIAGNOSIS — Z720 Tobacco use: Secondary | ICD-10-CM

## 2022-07-12 DIAGNOSIS — E782 Mixed hyperlipidemia: Secondary | ICD-10-CM

## 2022-07-12 DIAGNOSIS — R748 Abnormal levels of other serum enzymes: Secondary | ICD-10-CM

## 2022-07-12 DIAGNOSIS — F1010 Alcohol abuse, uncomplicated: Secondary | ICD-10-CM

## 2022-07-12 DIAGNOSIS — N5201 Erectile dysfunction due to arterial insufficiency: Secondary | ICD-10-CM

## 2022-07-12 DIAGNOSIS — M25522 Pain in left elbow: Secondary | ICD-10-CM

## 2022-07-12 DIAGNOSIS — K2100 Gastro-esophageal reflux disease with esophagitis, without bleeding: Secondary | ICD-10-CM

## 2022-07-12 DIAGNOSIS — I1310 Hypertensive heart and chronic kidney disease without heart failure, with stage 1 through stage 4 chronic kidney disease, or unspecified chronic kidney disease: Secondary | ICD-10-CM

## 2022-07-12 DIAGNOSIS — I10 Essential (primary) hypertension: Secondary | ICD-10-CM

## 2022-07-12 MED ORDER — MAGNESIUM OXIDE 400 MG PO TABS
400.0000 mg | ORAL_TABLET | Freq: Every day | ORAL | 1 refills | Status: DC
  Filled 2022-07-12: qty 90, 90d supply, fill #0

## 2022-07-12 MED ORDER — FOLIC ACID 1 MG PO TABS
1.0000 mg | ORAL_TABLET | Freq: Every day | ORAL | 1 refills | Status: DC
  Filled 2022-07-12: qty 90, 90d supply, fill #0
  Filled 2022-10-11: qty 90, 90d supply, fill #1

## 2022-07-12 MED ORDER — CARVEDILOL 25 MG PO TABS
25.0000 mg | ORAL_TABLET | Freq: Two times a day (BID) | ORAL | 2 refills | Status: DC
  Filled 2022-07-12: qty 180, 90d supply, fill #0
  Filled 2022-11-26: qty 180, 90d supply, fill #1
  Filled 2023-05-29: qty 180, 90d supply, fill #2

## 2022-07-12 MED ORDER — OMEPRAZOLE 40 MG PO CPDR
40.0000 mg | DELAYED_RELEASE_CAPSULE | Freq: Every day | ORAL | 1 refills | Status: DC
  Filled 2022-07-12: qty 90, 90d supply, fill #0

## 2022-07-12 MED ORDER — THERA VITAL M PO TABS
1.0000 | ORAL_TABLET | Freq: Every day | ORAL | 1 refills | Status: DC
  Filled 2022-07-12: qty 90, 90d supply, fill #0

## 2022-07-12 MED ORDER — THIAMINE HCL 100 MG PO TABS
100.0000 mg | ORAL_TABLET | Freq: Every day | ORAL | 1 refills | Status: DC
  Filled 2022-07-12: qty 30, 30d supply, fill #0
  Filled 2022-08-13: qty 30, 30d supply, fill #1
  Filled 2022-10-11: qty 30, 30d supply, fill #2
  Filled 2022-11-13 (×2): qty 30, 30d supply, fill #3

## 2022-07-12 MED ORDER — VITAMIN B-12 500 MCG PO TABS
1000.0000 ug | ORAL_TABLET | Freq: Every day | ORAL | 1 refills | Status: DC
  Filled 2022-07-12: qty 180, 90d supply, fill #0
  Filled 2022-10-11: qty 60, 30d supply, fill #1
  Filled 2022-11-05: qty 60, 30d supply, fill #2
  Filled 2022-11-26: qty 60, 30d supply, fill #3

## 2022-07-12 MED ORDER — LOSARTAN POTASSIUM 100 MG PO TABS
100.0000 mg | ORAL_TABLET | Freq: Every morning | ORAL | 1 refills | Status: DC
  Filled 2022-07-12 – 2022-11-05 (×2): qty 90, 90d supply, fill #0
  Filled 2023-02-25: qty 90, 90d supply, fill #1

## 2022-07-12 MED ORDER — HYDROCHLOROTHIAZIDE 12.5 MG PO CAPS
12.5000 mg | ORAL_CAPSULE | Freq: Every morning | ORAL | 2 refills | Status: DC
  Filled 2022-07-12: qty 90, 90d supply, fill #0

## 2022-07-12 MED ORDER — AMLODIPINE BESYLATE 10 MG PO TABS
10.0000 mg | ORAL_TABLET | Freq: Every evening | ORAL | 1 refills | Status: DC
  Filled 2022-07-12 – 2022-10-23 (×2): qty 90, 90d supply, fill #0
  Filled 2023-05-29: qty 90, 90d supply, fill #1

## 2022-07-12 NOTE — Progress Notes (Signed)
Established Patient Office Visit  Subjective   Patient ID: Gary [name], male    DOB: 10/04/1971  Age: 51 y.o. MRN: [id number]  Chief Complaint  Patient presents with   Follow-up    HPI This is a a 51 y/o who presents for routine f/u of hypertension, hyperlipidemia, seizure disorder, CVA, alcohol use, tobacco use, and prediabetes. His last labs were on 08/31 and were notable for total cholesterol 238, HDL 37, LDL 176, AST 118, and ALT 94. He currently drinks 3-4 mixed drinks a day that contain about 1 shot of liquor per drink. He denies easy bleeding/bruising, right upper quadrant pain, hematuria, or hematochezia. He is smoking as well, and states that a pack of cigarettes last for about three days. His blood pressure is elevated today in clinic times two, 171/109 mmHg and 170/103. He does not routinely check his blood pressure at home. He endorses intermittent blurry and double vision, likely related to high blood pressure. Additionally, he has been experiencing "achey" elbow pain for the past few months that seems to be positional. He has been taking 600 mg Ibuprofen daily with minimal relief. He also endorses bilateral leg and foot tingling and pain that is worsened with activity and relieved by rest.He is also interested in switching is pantoprazole back to omeprazole, as he feels it helps his acid reflux better. He offers no other complaints.   Review of Systems  Constitutional: Negative.   HENT: Negative.    Eyes:  Positive for blurred vision and double vision (intermittently when waking up).  Respiratory: Negative.    Cardiovascular:  Positive for palpitations (happens a few times a day when at work).  Gastrointestinal:  Positive for heartburn.  Genitourinary: Negative.   Musculoskeletal:  Positive for joint pain (left elbow).  Skin: Negative.   Neurological:  Positive for tingling (intermittent feet bilaterally), sensory change (intermittent numbness in legs and feet bilateral)  and headaches (chronic).  Endo/Heme/Allergies:  Does not bruise/bleed easily.  Psychiatric/Behavioral: Negative.        Objective:     BP (!) 171/109   Pulse 82   Temp 98.1 F (36.7 C) (Oral)   Wt 213 lb 9.6 oz (96.9 kg)   SpO2 98%   BMI 29.79 kg/m  BP Readings from Last 3 Encounters:  07/12/22 (!) 171/109  06/14/22 (!) 172/102  06/07/22 (!) 150/97   Wt Readings from Last 3 Encounters:  07/12/22 213 lb 9.6 oz (96.9 kg)  06/14/22 220 lb (99.8 kg)  06/07/22 215 lb 9.6 oz (97.8 kg)      Physical Exam Constitutional:      Appearance: Normal appearance.  HENT:     Head: Normocephalic and atraumatic.  Cardiovascular:     Rate and Rhythm: Normal rate and regular rhythm.     Pulses: Normal pulses.     Heart sounds: Normal heart sounds.  Pulmonary:     Effort: Pulmonary effort is normal.     Breath sounds: Normal breath sounds.  Skin:    General: Skin is warm and dry.  Neurological:     General: No focal deficit present.     Mental Status: He is alert and oriented to person, place, and time. Mental status is at baseline.  Psychiatric:        Mood and Affect: Mood normal.        Behavior: Behavior normal.        Thought Content: Thought content normal.        Judgment: Judgment normal.  No results found for any visits on 07/12/22.  Last CBC Lab Results  Component Value Date   WBC 8.3 02/22/2022   HGB 13.4 02/22/2022   HCT 37.9 02/22/2022   MCV 95 02/22/2022   MCH 33.4 (H) 02/22/2022   RDW 11.2 (L) 02/22/2022   PLT 244 46/56/8127   Last metabolic panel Lab Results  Component Value Date   GLUCOSE 97 02/22/2022   NA 142 02/22/2022   K 4.3 02/22/2022   CL 104 02/22/2022   CO2 22 02/22/2022   BUN 14 02/22/2022   CREATININE 1.19 02/22/2022   EGFR 74 02/22/2022   CALCIUM 9.4 02/22/2022   PROT 7.7 06/07/2022   ALBUMIN 4.4 06/07/2022   LABGLOB 3.4 02/22/2022   AGRATIO 1.3 02/22/2022   BILITOT 0.7 06/07/2022   ALKPHOS 121 06/07/2022   AST 118 (H)  06/07/2022   ALT 94 (H) 06/07/2022   ANIONGAP 10 01/14/2021   Last lipids Lab Results  Component Value Date   CHOL 238 (H) 06/07/2022   HDL 37 (L) 06/07/2022   LDLCALC 176 (H) 06/07/2022   TRIG 138 06/07/2022   CHOLHDL 6.4 (H) 06/07/2022   Last hemoglobin A1c Lab Results  Component Value Date   HGBA1C 5.5 02/22/2022   Last thyroid functions Lab Results  Component Value Date   TSH 1.400 02/02/2021   Last vitamin B12 and Folate Lab Results  Component Value Date   VITAMINB12 447 12/29/2020   FOLATE 5.2 12/03/2018      The ASCVD Risk score (Arnett DK, et al., 2019) failed to calculate for the following reasons:   The patient has a prior MI or stroke diagnosis    Assessment & Plan:   1. 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 He should be taking losartan, hydrochlorothiazide, carvedilol, and amlodipine. He was educated today on taking losartan, hydrochlorothiazide, and carvedilol in the morning; and carvedilol and amlodipine in the evening. Educated on [place] and to exercise as tolerated. He should report to the ED if he experiences chest pain or shortness of breath.  - Comp Met (CMET); Future  2. Alcohol abuse - Goal set today with patient. He will try to not drink alcohol for one day of the week, as he is currently drinking every day. Goals will be reassessed at his next visit.  3. Tobacco abuse - Goal set today with patient. He will try to have a pack of cigarettes last for four days instead of three. Goals will be reassessed at his next visit. He has information for [employer].   4. Mixed hyperlipidemia - Patient educated on a low-cholesterol, low fat diet. He cannot take any statins at this time due to his liver.   5. Erectile dysfunction due to arterial insufficiency - Patient seen by urologist on 06/14/2022 and prescribed viagra. He states the medication is going well and he offers no side effects.    6. Elevated liver enzymes - Will re-check at next visit.  - Comp Met (CMET); Future - Magnesium; Future - Phosphorus; Future  7. Left elbow pain - He was given Salonpas patches to apply for elbow pain.   8. Gastroesophageal reflux disease with esophagitis without hemorrhage - He was prescribed omeprazole today. Pantoprazole was discontinued. He should not take Plavix and omeprazole at the same time. Avoid trigger foods.     Follow-up in one week, 07/19/2022   Rayvon Char

## 2022-07-12 NOTE — Patient Instructions (Addendum)
High Cholesterol  High cholesterol is a condition in which the blood has high levels of a white, waxy substance similar to fat (cholesterol). The liver makes all the cholesterol that the body needs. The human body needs small amounts of cholesterol to help build cells. A person gets extra or excess cholesterol from the food that he or she eats. The blood carries cholesterol from the liver to the rest of the body. If you have high cholesterol, deposits (plaques) may build up on the walls of your arteries. Arteries are the blood vessels that carry blood away from your heart. These plaques make the arteries narrow and stiff. Cholesterol plaques increase your risk for heart attack and stroke. Work with your health care provider to keep your cholesterol levels in a healthy range. What increases the risk? The following factors may make you more likely to develop this condition: Eating foods that are high in animal fat (saturated fat) or cholesterol. Being overweight. Not getting enough exercise. A family history of high cholesterol (familial hypercholesterolemia). Use of tobacco products. Having diabetes. What are the signs or symptoms? In most cases, high cholesterol does not usually cause any symptoms. In severe cases, very high cholesterol levels can cause: Fatty bumps under the skin (xanthomas). A white or gray ring around the black center (pupil) of the eye. How is this diagnosed? This condition may be diagnosed based on the results of a blood test. If you are older than 51 years of age, your health care provider may check your cholesterol levels every 4-6 years. You may be checked more often if you have high cholesterol or other risk factors for heart disease. The blood test for cholesterol measures: "Bad" cholesterol, or LDL cholesterol. This is the main type of cholesterol that causes heart disease. The desired level is less than 100 mg/dL (2.59 mmol/L). "Good" cholesterol, or HDL  cholesterol. HDL helps protect against heart disease by cleaning the arteries and carrying the LDL to the liver for processing. The desired level for HDL is 60 mg/dL (1.55 mmol/L) or higher. Triglycerides. These are fats that your body can store or burn for energy. The desired level is less than 150 mg/dL (1.69 mmol/L). Total cholesterol. This measures the total amount of cholesterol in your blood and includes LDL, HDL, and triglycerides. The desired level is less than 200 mg/dL (5.17 mmol/L). How is this treated? Treatment for high cholesterol starts with lifestyle changes, such as diet and exercise. Diet changes. You may be asked to eat foods that have more fiber and less saturated fats or added sugar. Lifestyle changes. These may include regular exercise, maintaining a healthy weight, and quitting use of tobacco products. Medicines. These are given when diet and lifestyle changes have not worked. You may be prescribed a statin medicine to help lower your cholesterol levels. Follow these instructions at home: Eating and drinking  Eat a healthy, balanced diet. This diet includes: Daily servings of a variety of fresh, frozen, or canned fruits and vegetables. Daily servings of whole grain foods that are rich in fiber. Foods that are low in saturated fats and trans fats. These include poultry and fish without skin, lean cuts of meat, and low-fat dairy products. A variety of fish, especially oily fish that contain omega-3 fatty acids. Aim to eat fish at least 2 times a week. Avoid foods and drinks that have added sugar. Use healthy cooking methods, such as roasting, grilling, broiling, baking, poaching, steaming, and stir-frying. Do not fry your food except for   stir-frying. If you drink alcohol: Limit how much you have to: 0-1 drink a day for women who are not pregnant. 0-2 drinks a day for men. Know how much alcohol is in a drink. In the [place] one drink equals one 12 oz bottle of beer (355 mL),  one 5 oz glass of wine (148 mL), or one 1 oz glass of hard liquor (44 mL). Lifestyle  Get regular exercise. Aim to exercise for a total of 150 minutes a week. Increase your activity level by doing activities such as gardening, walking, and taking the stairs. Do not use any products that contain nicotine or tobacco. These products include cigarettes, chewing tobacco, and vaping devices, such as e-cigarettes. If you need help quitting, ask your health care provider. General instructions Take over-the-counter and prescription medicines only as told by your health care provider. Keep all follow-up visits. This is important. Where to find more information American Heart Association: www.heart.org National Heart, Lung, and Blood Institute: PopSteam.is Contact a health care provider if: You have trouble achieving or maintaining a healthy diet or weight. You are starting an exercise program. You are unable to stop smoking. Get help right away if: You have chest pain. You have trouble breathing. You have discomfort or pain in your jaw, neck, back, shoulder, or arm. You have any symptoms of a stroke. "BE FAST" is an easy way to remember the main warning signs of a stroke: B - Balance. Signs are dizziness, sudden trouble walking, or loss of balance. E - Eyes. Signs are trouble seeing or a sudden change in vision. F - Face. Signs are sudden weakness or numbness of the face, or the face or eyelid drooping on one side. A - Arms. Signs are weakness or numbness in an arm. This happens suddenly and usually on one side of the body. S - Speech. Signs are sudden trouble speaking, slurred speech, or trouble understanding what people say. T - Time. Time to call emergency services. Write down what time symptoms started. You have other signs of a stroke, such as: A sudden, severe headache with no known cause. Nausea or vomiting. Seizure. These symptoms may represent a serious problem that is an  emergency. Do not wait to see if the symptoms will go away. Get medical help right away. Call your local emergency services (911 in the [place] Do not drive yourself to the hospital. Summary Cholesterol plaques increase your risk for heart attack and stroke. Work with your health care provider to keep your cholesterol levels in a healthy range. Eat a healthy, balanced diet, get regular exercise, and maintain a healthy weight. Do not use any products that contain nicotine or tobacco. These products include cigarettes, chewing tobacco, and vaping devices, such as e-cigarettes. Get help right away if you have any symptoms of a stroke. This information is not intended to replace advice given to you by your health care provider. Make sure you discuss any questions you have with your health care provider. Document Revised: 12/08/2020 Document Reviewed: 11/28/2020 Elsevier Patient Education  2023 Else[employer] Inc[employer] DASH Eating Plan DASH stands for Dietary Approaches to Stop Hypertension. The DASH eating plan is a healthy eating plan that has been shown to: Reduce high blood pressure (hypertension). Reduce your risk for type 2 diabetes, heart disease, and stroke. Help with weight loss. What are tips for following this plan? Reading food labels Check food labels for the amount of salt (sodium) per serving. Choose foods with less than 5 percent of the  Daily Value of sodium. Generally, foods with less than 300 milligrams (mg) of sodium per serving fit into this eating plan. To find whole grains, look for the word "whole" as the first word in the ingredient list. Shopping Buy products labeled as "low-sodium" or "no salt added." Buy fresh foods. Avoid canned foods and pre-made or frozen meals. Cooking Avoid adding salt when cooking. Use salt-free seasonings or herbs instead of table salt or sea salt. Check with your health care provider or pharmacist before using salt substitutes. Do not fry foods. Cook foods  using healthy methods such as baking, boiling, grilling, roasting, and broiling instead. Cook with heart-healthy oils, such as olive, canola, avocado, soybean, or sunflower oil. Meal planning  Eat a balanced diet that includes: 4 or more servings of fruits and 4 or more servings of vegetables each day. Try to fill one-half of your plate with fruits and vegetables. 6-8 servings of whole grains each day. Less than 6 oz (170 g) of lean meat, poultry, or fish each day. A 3-oz (85-g) serving of meat is about the same size as a deck of cards. One egg equals 1 oz (28 g). 2-3 servings of low-fat dairy each day. One serving is 1 cup (237 mL). 1 serving of nuts, seeds, or beans 5 times each week. 2-3 servings of heart-healthy fats. Healthy fats called omega-3 fatty acids are found in foods such as walnuts, flaxseeds, fortified milks, and eggs. These fats are also found in cold-water fish, such as sardines, salmon, and mackerel. Limit how much you eat of: Canned or prepackaged foods. Food that is high in trans fat, such as some fried foods. Food that is high in saturated fat, such as fatty meat. Desserts and other sweets, sugary drinks, and other foods with added sugar. Full-fat dairy products. Do not salt foods before eating. Do not eat more than 4 egg yolks a week. Try to eat at least 2 vegetarian meals a week. Eat more home-cooked food and less restaurant, buffet, and fast food. Lifestyle When eating at a restaurant, ask that your food be prepared with less salt or no salt, if possible. If you drink alcohol: Limit how much you use to: 0-1 drink a day for women who are not pregnant. 0-2 drinks a day for men. Be aware of how much alcohol is in your drink. In the [place] one drink equals one 12 oz bottle of beer (355 mL), one 5 oz glass of wine (148 mL), or one 1 oz glass of hard liquor (44 mL). General information Avoid eating more than 2,300 mg of salt a day. If you have hypertension, you may need  to reduce your sodium intake to 1,500 mg a day. Work with your health care provider to maintain a healthy body weight or to lose weight. Ask what an ideal weight is for you. Get at least 30 minutes of exercise that causes your heart to beat faster (aerobic exercise) most days of the week. Activities may include walking, swimming, or biking. Work with your health care provider or dietitian to adjust your eating plan to your individual calorie needs. What foods should I eat? Fruits All fresh, dried, or frozen fruit. Canned fruit in natural juice (without added sugar). Vegetables Fresh or frozen vegetables (raw, steamed, roasted, or grilled). Low-sodium or reduced-sodium tomato and vegetable juice. Low-sodium or reduced-sodium tomato sauce and tomato paste. Low-sodium or reduced-sodium canned vegetables. Grains Whole-grain or whole-wheat bread. Whole-grain or whole-wheat pasta. Brown rice. [place]. Bulgur. Whole-grain  and low-sodium cereals. Pita bread. Low-fat, low-sodium crackers. Whole-wheat flour tortillas. Meats and other proteins Skinless chicken or [place]. Ground chicken or [place]. Pork with fat trimmed off. Fish and seafood. Egg whites. Dried beans, peas, or lentils. Unsalted nuts, nut butters, and seeds. Unsalted canned beans. Lean cuts of beef with fat trimmed off. Low-sodium, lean precooked or cured meat, such as sausages or meat loaves. Dairy Low-fat (1%) or fat-free (skim) milk. Reduced-fat, low-fat, or fat-free cheeses. Nonfat, low-sodium ricotta or cottage cheese. Low-fat or nonfat yogurt. Low-fat, low-sodium cheese. Fats and oils Soft margarine without trans fats. Vegetable oil. Reduced-fat, low-fat, or light mayonnaise and salad dressings (reduced-sodium). Canola, safflower, olive, avocado, soybean, and sunflower oils. Avocado. Seasonings and condiments Herbs. Spices. Seasoning mixes without salt. Other foods Unsalted popcorn and pretzels. Fat-free sweets. The items listed  above may not be a complete list of foods and beverages you can eat. Contact a dietitian for more information. What foods should I avoid? Fruits Canned fruit in a light or heavy syrup. Fried fruit. Fruit in cream or butter sauce. Vegetables Creamed or fried vegetables. Vegetables in a cheese sauce. Regular canned vegetables (not low-sodium or reduced-sodium). Regular canned tomato sauce and paste (not low-sodium or reduced-sodium). Regular tomato and vegetable juice (not low-sodium or reduced-sodium). Angie Fava. Olives. Grains Baked goods made with fat, such as croissants, muffins, or some breads. Dry pasta or rice meal packs. Meats and other proteins Fatty cuts of meat. Ribs. Fried meat. Berniece Salines. Bologna, salami, and other precooked or cured meats, such as sausages or meat loaves. Fat from the back of a pig (fatback). Bratwurst. Salted nuts and seeds. Canned beans with added salt. Canned or smoked fish. Whole eggs or egg yolks. Chicken or [place] with skin. Dairy Whole or 2% milk, cream, and half-and-half. Whole or full-fat cream cheese. Whole-fat or sweetened yogurt. Full-fat cheese. Nondairy creamers. Whipped toppings. Processed cheese and cheese spreads. Fats and oils Butter. Stick margarine. Lard. Shortening. Ghee. Bacon fat. Tropical oils, such as coconut, palm kernel, or palm oil. Seasonings and condiments Onion salt, garlic salt, seasoned salt, table salt, and sea salt. Worcestershire sauce. Tartar sauce. Barbecue sauce. Teriyaki sauce. Soy sauce, including reduced-sodium. Steak sauce. Canned and packaged gravies. Fish sauce. Oyster sauce. Cocktail sauce. Store-bought horseradish. Ketchup. Mustard. Meat flavorings and tenderizers. Bouillon cubes. Hot sauces. Pre-made or packaged marinades. Pre-made or packaged taco seasonings. Relishes. Regular salad dressings. Other foods Salted popcorn and pretzels. The items listed above may not be a complete list of foods and beverages you should avoid.  Contact a dietitian for more information. Where to find more information National Heart, Lung, and Blood Institute: https://wilson-eaton.com/ American Heart Association: www.heart.org Academy of Nutrition and Dietetics: www.eatright.[place]: www.kidney.org Summary The DASH eating plan is a healthy eating plan that has been shown to reduce high blood pressure (hypertension). It may also reduce your risk for type 2 diabetes, heart disease, and stroke. When on the DASH eating plan, aim to eat more fresh fruits and vegetables, whole grains, lean proteins, low-fat dairy, and heart-healthy fats. With the DASH eating plan, you should limit salt (sodium) intake to 2,300 mg a day. If you have hypertension, you may need to reduce your sodium intake to 1,500 mg a day. Work with your health care provider or dietitian to adjust your eating plan to your individual calorie needs. This information is not intended to replace advice given to you by your health care provider. Make sure you discuss any questions you have with your  health care provider. Document Revised: 08/28/2019 Document Reviewed: 08/28/2019 Elsevier Patient Education  2023 Elsevier Inc. Alcohol Misuse and Dependence Information, Adult Alcohol is a widely available drug and people choose to drink alcohol in different amounts. Alcohol misuse and dependence can have a negative effect on your life. Alcohol misuse is when you use alcohol too much or too often. You may have a hard time setting a limit on the amount you drink. Alcohol dependence is when you use alcohol consistently for a period of time, and your body changes as a result. Alcohol dependence can make it hard for you to stop drinking because you may start to feel sick or different when you do not drink alcohol. These symptoms are known as withdrawal. People who drink alcohol very often and in large amounts, may develop what is called an alcohol use disorder. How can alcohol  misuse and dependence affect me? Drinking too much can lead to addiction. You may feel like you need alcohol to function normally. You may drink alcohol before work in the morning, during the day, or as soon as you get home from work in the evening. These actions can result in: Poor work performance. Job loss. Financial problems. Car crashes or criminal charges from driving after drinking alcohol. Problems in your relationships with friends and family. Losing the trust and respect of coworkers, friends, and family. Drinking heavily over a long period of time can permanently damage your body and brain, and can cause lifelong health issues, such as: Damage to your liver or pancreas. Heart problems, high blood pressure, or stroke. Certain cancers. Decreased ability to fight infections. Brain or nerve damage. Depression. Early death, also called premature death. If you are careless or you crave alcohol, it is easy to drink more than your body can handle (overdose). Alcohol overdose is a serious situation that requires hospitalization. It may lead to permanent injuries or death. What can increase my risk? Having a family history of alcohol misuse. Having depression or other mental health conditions. Beginning to drink at an early age. Binge drinking often. Experiencing trauma, stress, and an unstable home life during childhood. Spending time with people who drink often. What actions can I take to prevent alcohol misuse and dependence? Do not drink alcohol if: Your health care provider tells you not to drink. You are pregnant, may be pregnant, or are planning to become pregnant. If you drink alcohol: Limit how much you have to: 0-1 drink a day for women who are not pregnant. 0-2 drinks a day for men. Know how much alcohol is in your drink. In the [place] one drink equals one 12 oz bottle of beer (355 mL), one 5 oz glass of wine (148 mL), or one 1 oz glass of hard liquor (44 mL). If you  think you have an alcohol dependency problem, decide to stop drinking. This can be very hard to do if you are used to frequently drinking alcohol. If you begin to have withdrawal symptoms, talk with your health care provider or a person that you trust. These symptoms may include anxiety, shaky hands, headache, nausea, sweating, or not being able to sleep. Choose to drink nonalcoholic beverages in social gatherings and places where there may be alcohol. Activity Spend more time on activities that you enjoy that do not involve alcohol, like hobbies or exercise. Find healthy ways to cope with stress, such as meditation or spending time with people you care about. General information Talk to your family, coworkers, and friends  about supporting you in your efforts to stop drinking. If they drink, ask them not to drink around you. Spend more time with people who do not drink alcohol. If you think that you have an alcohol dependency problem: Tell friends or family about your concerns. Talk with your health care provider or another health professional about where to get help. Work with a Transport planner and a Regulatory affairs officer. Consider joining a support group for people who struggle with alcohol misuse and dependence. Where to find support  Your health care provider. SMART Recovery: smartrecovery.org Local treatment centers or chemical dependency counselors. Local AA groups in your community: ShedSizes.ch Where to find more information Centers for Disease Control and Prevention: StoreMirror.com.cy [employer] on Alcohol Abuse and Alcoholism: LinkVoyage.dk Alcoholics Anonymous (AA): ShedSizes.ch Contact a health care provider if: You drank more or for longer than you intended on more than one occasion. You often drink to the point of vomiting or passing out. You have problems in your life due to drinking, but you continue to drink. You keep drinking even though you feel anxious, depressed, or have  experienced memory loss. You have stopped doing the things you used to enjoy in order to drink. You have to drink more than you used to in order to get the effect you want. You experience anxiety, sweating, nausea, shakiness, and trouble sleeping when you try to stop drinking. Get help right away if: You have serious withdrawal symptoms, including: Confusion. Racing heart. High blood pressure. Fever. These symptoms may be an emergency. Get help right away. Call 911. Do not wait to see if the symptoms will go away. Do not drive yourself to the hospital. Also, get help right away if: You have thoughts about hurting yourself or others. Take one of these steps if you feel like you may hurt yourself or others, or have thoughts about taking your own life: Call 911. Call the [place] at (435)[phone] or 988. This is open 24 hours a day. Text the Crisis Text Line at 567-505-7468. Summary Alcohol misuse and dependence can have a negative effect on your life. Drinking too much or too often can lead to addiction. If you drink alcohol, limit how much you use. If you are having trouble keeping your drinking under control, find ways to change your behavior. Hobbies, calming activities, exercise, or support groups can help. If you feel you need help with changing your drinking habits, talk with your health care provider, a good friend, or a therapist, or go to a support group. This information is not intended to replace advice given to you by your health care provider. Make sure you discuss any questions you have with your health care provider. Document Revised: 11/29/2021 Document Reviewed: 11/29/2021 Elsevier Patient Education  [place].

## 2022-07-19 ENCOUNTER — Ambulatory Visit: Payer: Self-pay | Admitting: Adult Health

## 2022-07-19 ENCOUNTER — Other Ambulatory Visit: Payer: Self-pay

## 2022-07-19 ENCOUNTER — Encounter: Payer: Self-pay | Admitting: Adult Health

## 2022-07-19 VITALS — BP 155/103 | HR 83 | Temp 98.0°F | Resp 16 | Ht 71.0 in | Wt 213.6 lb

## 2022-07-19 DIAGNOSIS — I10 Essential (primary) hypertension: Secondary | ICD-10-CM

## 2022-07-19 DIAGNOSIS — Z720 Tobacco use: Secondary | ICD-10-CM

## 2022-07-19 DIAGNOSIS — F1010 Alcohol abuse, uncomplicated: Secondary | ICD-10-CM

## 2022-07-19 MED ORDER — HYDROCHLOROTHIAZIDE 25 MG PO TABS
25.0000 mg | ORAL_TABLET | Freq: Every day | ORAL | 1 refills | Status: DC
  Filled 2022-07-19: qty 90, 90d supply, fill #0

## 2022-07-19 NOTE — Progress Notes (Signed)
Established Patient Office Visit  Subjective   Patient ID: Gary [name], male    DOB: 12-11-1970  Age: 51 y.o. MRN: [id number]  Chief Complaint  Patient presents with   Follow-up    1 week follow up   Hypertension    HPI  Gary [name] is a a 51 y/o who presents for routine f/u of hypertension, hyperlipidemia, seizure disorder, CVA, alcohol use, tobacco use, and prediabetes. His last visit was on 07/12/2022 and his blood pressure was elevated to 171/109 mmHg. Today in clinic his blood pressure was improved, but still elevated (163/97 mmHg and 155/103 mmHg). He states he has been compliant with his medications and is not experiencing any side effects. He denies chest pain, headache, or shortness of breath. He states his acid reflux has been well-controlled since switching to omeprazole. Goals were set at last visit regarding his alcohol and tobacco use. Today is his day to not drink alcohol and he is making an effort to only smoke 5 cigarettes a day. He is currently in the process of applying for Medicaid. Overall, he is doing well and offers no further complaints.     Review of Systems  Constitutional: Negative.   Eyes:  Positive for blurred vision and double vision.  Respiratory: Negative.    Cardiovascular:  Positive for claudication.  Gastrointestinal: Negative.   Genitourinary: Negative.   Musculoskeletal: Negative.   Neurological:  Positive for tingling and sensory change.  Endo/Heme/Allergies: Negative.   Psychiatric/Behavioral: Negative.        Objective:     BP (!) 163/97 (BP Location: Right Arm, Patient Position: Sitting, Cuff Size: Large)   Pulse 83   Temp 98 F (36.7 C) (Oral)   Resp 16   Ht 5' 11"  (1.803 m)   Wt 213 lb 9.6 oz (96.9 kg)   SpO2 96%   BMI 29.79 kg/m  BP Readings from Last 3 Encounters:  07/19/22 (!) 163/97  07/12/22 (!) 170/103  06/14/22 (!) 172/102   Wt Readings from Last 3 Encounters:  07/19/22 213 lb 9.6 oz (96.9 kg)  07/12/22 213  lb 9.6 oz (96.9 kg)  06/14/22 220 lb (99.8 kg)      Physical Exam Constitutional:      Appearance: Normal appearance.  HENT:     Head: Atraumatic.  Cardiovascular:     Rate and Rhythm: Normal rate and regular rhythm.     Pulses: Normal pulses.     Heart sounds: Normal heart sounds.  Pulmonary:     Effort: Pulmonary effort is normal.     Breath sounds: Normal breath sounds.  Skin:    General: Skin is warm and dry.  Neurological:     General: No focal deficit present.     Mental Status: He is alert and oriented to person, place, and time.  Psychiatric:        Mood and Affect: Mood normal.        Behavior: Behavior normal.        Thought Content: Thought content normal.        Judgment: Judgment normal.      No results found for any visits on 07/19/22.  Last CBC Lab Results  Component Value Date   WBC 8.3 02/22/2022   HGB 13.4 02/22/2022   HCT 37.9 02/22/2022   MCV 95 02/22/2022   MCH 33.4 (H) 02/22/2022   RDW 11.2 (L) 02/22/2022   PLT 244 15/95/3967   Last metabolic panel Lab Results  Component Value Date  GLUCOSE 97 02/22/2022   NA 142 02/22/2022   K 4.3 02/22/2022   CL 104 02/22/2022   CO2 22 02/22/2022   BUN 14 02/22/2022   CREATININE 1.19 02/22/2022   EGFR 74 02/22/2022   CALCIUM 9.4 02/22/2022   PROT 7.7 06/07/2022   ALBUMIN 4.4 06/07/2022   LABGLOB 3.4 02/22/2022   AGRATIO 1.3 02/22/2022   BILITOT 0.7 06/07/2022   ALKPHOS 121 06/07/2022   AST 118 (H) 06/07/2022   ALT 94 (H) 06/07/2022   ANIONGAP 10 01/14/2021   Last lipids Lab Results  Component Value Date   CHOL 238 (H) 06/07/2022   HDL 37 (L) 06/07/2022   LDLCALC 176 (H) 06/07/2022   TRIG 138 06/07/2022   CHOLHDL 6.4 (H) 06/07/2022   Last hemoglobin A1c Lab Results  Component Value Date   HGBA1C 5.5 02/22/2022   Last thyroid functions Lab Results  Component Value Date   TSH 1.400 02/02/2021   Last vitamin D No results found for: "25OHVITD2", "25OHVITD3", "VD25OH" Last  vitamin B12 and Folate Lab Results  Component Value Date   VITAMINB12 447 12/29/2020   FOLATE 5.2 12/03/2018      The ASCVD Risk score (Arnett DK, et al., 2019) failed to calculate for the following reasons:   The patient has a prior MI or stroke diagnosis    Assessment & Plan:   1. Primary hypertension - Continue taking amlodipine, carvedilol, and losartan as prescribed. Hydrochlorothiazide dose increased from 12.5 mg to 25 mg today for better blood pressure control. He should begin checking his blood pressure at home and bring in a log at his next visit. DASH diet and exercise as tolerated. Report to ED for blood pressure >180/120 mmHg.   2. Alcohol abuse - He continues to drink 3 mixed drinks/day with 1 shot of liquor in each drink. A goal was set at last visit to not drink for one day of the week, which is today. His goal for next week is to not drink for two days out of the week. Will reassess goals at next visit. He was educated on s/sx of alcohol withdrawal and was told to report to the ED if he began experiencing restlessness, tremors, anxiety, etc.   3. Tobacco abuse - He is currently smoking 5 cigarettes a day. His goal for next week is to only smoke 4 cigarettes a day. Will reassess goals at next visit.     Follow-up in 2 weeks.    Gary Char, FNP Student  Patient seen and examined with me. Plan of care established in collaboration with me and reviewed with patient. [facility] ANP-BC Pulmonary and Critical Care Medicine [facility] Pager [phone] or [phone]  NB: This document was prepared using Dragon voice recognition software and may include unintentional dictation errors.

## 2022-07-19 NOTE — Patient Instructions (Signed)
Hypertension, Adult Hypertension is another name for high blood pressure. High blood pressure forces your heart to work harder to pump blood. This can cause problems over time. There are two numbers in a blood pressure reading. There is a top number (systolic) over a bottom number (diastolic). It is best to have a blood pressure that is below 120/80. What are the causes? The cause of this condition is not known. Some other conditions can lead to high blood pressure. What increases the risk? Some lifestyle factors can make you more likely to develop high blood pressure: Smoking. Not getting enough exercise or physical activity. Being overweight. Having too much fat, sugar, calories, or salt (sodium) in your diet. Drinking too much alcohol. Other risk factors include: Having any of these conditions: Heart disease. Diabetes. High cholesterol. Kidney disease. Obstructive sleep apnea. Having a family history of high blood pressure and high cholesterol. Age. The risk increases with age. Stress. What are the signs or symptoms? High blood pressure may not cause symptoms. Very high blood pressure (hypertensive crisis) may cause: Headache. Fast or uneven heartbeats (palpitations). Shortness of breath. Nosebleed. Vomiting or feeling like you may vomit (nauseous). Changes in how you see. Very bad chest pain. Feeling dizzy. Seizures. How is this treated? This condition is treated by making healthy lifestyle changes, such as: Eating healthy foods. Exercising more. Drinking less alcohol. Your doctor may prescribe medicine if lifestyle changes do not help enough and if: Your top number is above 130. Your bottom number is above 80. Your personal target blood pressure may vary. Follow these instructions at home: Eating and drinking  If told, follow the DASH eating plan. To follow this plan: Fill one half of your plate at each meal with fruits and vegetables. Fill one fourth of your plate  at each meal with whole grains. Whole grains include whole-wheat pasta, brown rice, and whole-grain bread. Eat or drink low-fat dairy products, such as skim milk or low-fat yogurt. Fill one fourth of your plate at each meal with low-fat (lean) proteins. Low-fat proteins include fish, chicken without skin, eggs, beans, and tofu. Avoid fatty meat, cured and processed meat, or chicken with skin. Avoid pre-made or processed food. Limit the amount of salt in your diet to less than 1,500 mg each day. Do not drink alcohol if: Your doctor tells you not to drink. You are pregnant, may be pregnant, or are planning to become pregnant. If you drink alcohol: Limit how much you have to: 0-1 drink a day for women. 0-2 drinks a day for men. Know how much alcohol is in your drink. In the [place] one drink equals one 12 oz bottle of beer (355 mL), one 5 oz glass of wine (148 mL), or one 1 oz glass of hard liquor (44 mL). Lifestyle  Work with your doctor to stay at a healthy weight or to lose weight. Ask your doctor what the best weight is for you. Get at least 30 minutes of exercise that causes your heart to beat faster (aerobic exercise) most days of the week. This may include walking, swimming, or biking. Get at least 30 minutes of exercise that strengthens your muscles (resistance exercise) at least 3 days a week. This may include lifting weights or doing Pilates. Do not smoke or use any products that contain nicotine or tobacco. If you need help quitting, ask your doctor. Check your blood pressure at home as told by your doctor. Keep all follow-up visits. Medicines Take over-the-counter and prescription medicines  only as told by your doctor. Follow directions carefully. Do not skip doses of blood pressure medicine. The medicine does not work as well if you skip doses. Skipping doses also puts you at risk for problems. Ask your doctor about side effects or reactions to medicines that you should watch  for. Contact a doctor if: You think you are having a reaction to the medicine you are taking. You have headaches that keep coming back. You feel dizzy. You have swelling in your ankles. You have trouble with your vision. Get help right away if: You get a very bad headache. You start to feel mixed up (confused). You feel weak or numb. You feel faint. You have very bad pain in your: Chest. Belly (abdomen). You vomit more than once. You have trouble breathing. These symptoms may be an emergency. Get help right away. Call 911. Do not wait to see if the symptoms will go away. Do not drive yourself to the hospital. Summary Hypertension is another name for high blood pressure. High blood pressure forces your heart to work harder to pump blood. For most people, a normal blood pressure is less than 120/80. Making healthy choices can help lower blood pressure. If your blood pressure does not get lower with healthy choices, you may need to take medicine. This information is not intended to replace advice given to you by your health care provider. Make sure you discuss any questions you have with your health care provider. Document Revised: 07/13/2021 Document Reviewed: 07/13/2021 Elsevier Patient Education  [place] Eating Plan DASH stands for Dietary Approaches to Stop Hypertension. The DASH eating plan is a healthy eating plan that has been shown to: Reduce high blood pressure (hypertension). Reduce your risk for type 2 diabetes, heart disease, and stroke. Help with weight loss. What are tips for following this plan? Reading food labels Check food labels for the amount of salt (sodium) per serving. Choose foods with less than 5 percent of the Daily Value of sodium. Generally, foods with less than 300 milligrams (mg) of sodium per serving fit into this eating plan. To find whole grains, look for the word "whole" as the first word in the ingredient list. Shopping Buy  products labeled as "low-sodium" or "no salt added." Buy fresh foods. Avoid canned foods and pre-made or frozen meals. Cooking Avoid adding salt when cooking. Use salt-free seasonings or herbs instead of table salt or sea salt. Check with your health care provider or pharmacist before using salt substitutes. Do not fry foods. Cook foods using healthy methods such as baking, boiling, grilling, roasting, and broiling instead. Cook with heart-healthy oils, such as olive, canola, avocado, soybean, or sunflower oil. Meal planning  Eat a balanced diet that includes: 4 or more servings of fruits and 4 or more servings of vegetables each day. Try to fill one-half of your plate with fruits and vegetables. 6-8 servings of whole grains each day. Less than 6 oz (170 g) of lean meat, poultry, or fish each day. A 3-oz (85-g) serving of meat is about the same size as a deck of cards. One egg equals 1 oz (28 g). 2-3 servings of low-fat dairy each day. One serving is 1 cup (237 mL). 1 serving of nuts, seeds, or beans 5 times each week. 2-3 servings of heart-healthy fats. Healthy fats called omega-3 fatty acids are found in foods such as walnuts, flaxseeds, fortified milks, and eggs. These fats are also found in cold-water fish, such as sardines,  salmon, and mackerel. Limit how much you eat of: Canned or prepackaged foods. Food that is high in trans fat, such as some fried foods. Food that is high in saturated fat, such as fatty meat. Desserts and other sweets, sugary drinks, and other foods with added sugar. Full-fat dairy products. Do not salt foods before eating. Do not eat more than 4 egg yolks a week. Try to eat at least 2 vegetarian meals a week. Eat more home-cooked food and less restaurant, buffet, and fast food. Lifestyle When eating at a restaurant, ask that your food be prepared with less salt or no salt, if possible. If you drink alcohol: Limit how much you use to: 0-1 drink a day for women  who are not pregnant. 0-2 drinks a day for men. Be aware of how much alcohol is in your drink. In the [place] one drink equals one 12 oz bottle of beer (355 mL), one 5 oz glass of wine (148 mL), or one 1 oz glass of hard liquor (44 mL). General information Avoid eating more than 2,300 mg of salt a day. If you have hypertension, you may need to reduce your sodium intake to 1,500 mg a day. Work with your health care provider to maintain a healthy body weight or to lose weight. Ask what an ideal weight is for you. Get at least 30 minutes of exercise that causes your heart to beat faster (aerobic exercise) most days of the week. Activities may include walking, swimming, or biking. Work with your health care provider or dietitian to adjust your eating plan to your individual calorie needs. What foods should I eat? Fruits All fresh, dried, or frozen fruit. Canned fruit in natural juice (without added sugar). Vegetables Fresh or frozen vegetables (raw, steamed, roasted, or grilled). Low-sodium or reduced-sodium tomato and vegetable juice. Low-sodium or reduced-sodium tomato sauce and tomato paste. Low-sodium or reduced-sodium canned vegetables. Grains Whole-grain or whole-wheat bread. Whole-grain or whole-wheat pasta. Brown rice. [place]. Bulgur. Whole-grain and low-sodium cereals. Pita bread. Low-fat, low-sodium crackers. Whole-wheat flour tortillas. Meats and other proteins Skinless chicken or [place]. Ground chicken or [place]. Pork with fat trimmed off. Fish and seafood. Egg whites. Dried beans, peas, or lentils. Unsalted nuts, nut butters, and seeds. Unsalted canned beans. Lean cuts of beef with fat trimmed off. Low-sodium, lean precooked or cured meat, such as sausages or meat loaves. Dairy Low-fat (1%) or fat-free (skim) milk. Reduced-fat, low-fat, or fat-free cheeses. Nonfat, low-sodium ricotta or cottage cheese. Low-fat or nonfat yogurt. Low-fat, low-sodium cheese. Fats and oils Soft  margarine without trans fats. Vegetable oil. Reduced-fat, low-fat, or light mayonnaise and salad dressings (reduced-sodium). Canola, safflower, olive, avocado, soybean, and sunflower oils. Avocado. Seasonings and condiments Herbs. Spices. Seasoning mixes without salt. Other foods Unsalted popcorn and pretzels. Fat-free sweets. The items listed above may not be a complete list of foods and beverages you can eat. Contact a dietitian for more information. What foods should I avoid? Fruits Canned fruit in a light or heavy syrup. Fried fruit. Fruit in cream or butter sauce. Vegetables Creamed or fried vegetables. Vegetables in a cheese sauce. Regular canned vegetables (not low-sodium or reduced-sodium). Regular canned tomato sauce and paste (not low-sodium or reduced-sodium). Regular tomato and vegetable juice (not low-sodium or reduced-sodium). Angie Fava. Olives. Grains Baked goods made with fat, such as croissants, muffins, or some breads. Dry pasta or rice meal packs. Meats and other proteins Fatty cuts of meat. Ribs. Fried meat. Berniece Salines. Bologna, salami, and other precooked or cured  meats, such as sausages or meat loaves. Fat from the back of a pig (fatback). Bratwurst. Salted nuts and seeds. Canned beans with added salt. Canned or smoked fish. Whole eggs or egg yolks. Chicken or [place] with skin. Dairy Whole or 2% milk, cream, and half-and-half. Whole or full-fat cream cheese. Whole-fat or sweetened yogurt. Full-fat cheese. Nondairy creamers. Whipped toppings. Processed cheese and cheese spreads. Fats and oils Butter. Stick margarine. Lard. Shortening. Ghee. Bacon fat. Tropical oils, such as coconut, palm kernel, or palm oil. Seasonings and condiments Onion salt, garlic salt, seasoned salt, table salt, and sea salt. Worcestershire sauce. Tartar sauce. Barbecue sauce. Teriyaki sauce. Soy sauce, including reduced-sodium. Steak sauce. Canned and packaged gravies. Fish sauce. Oyster sauce. Cocktail  sauce. Store-bought horseradish. Ketchup. Mustard. Meat flavorings and tenderizers. Bouillon cubes. Hot sauces. Pre-made or packaged marinades. Pre-made or packaged taco seasonings. Relishes. Regular salad dressings. Other foods Salted popcorn and pretzels. The items listed above may not be a complete list of foods and beverages you should avoid. Contact a dietitian for more information. Where to find more information National Heart, Lung, and Blood Institute: https://wilson-eaton.com/ American Heart Association: www.heart.org Academy of Nutrition and Dietetics: www.eatright.[place]: www.kidney.org Summary The DASH eating plan is a healthy eating plan that has been shown to reduce high blood pressure (hypertension). It may also reduce your risk for type 2 diabetes, heart disease, and stroke. When on the DASH eating plan, aim to eat more fresh fruits and vegetables, whole grains, lean proteins, low-fat dairy, and heart-healthy fats. With the DASH eating plan, you should limit salt (sodium) intake to 2,300 mg a day. If you have hypertension, you may need to reduce your sodium intake to 1,500 mg a day. Work with your health care provider or dietitian to adjust your eating plan to your individual calorie needs. This information is not intended to replace advice given to you by your health care provider. Make sure you discuss any questions you have with your health care provider. Document Revised: 08/28/2019 Document Reviewed: 08/28/2019 Elsevier Patient Education  [place].

## 2022-07-20 ENCOUNTER — Other Ambulatory Visit: Payer: Self-pay

## 2022-07-26 ENCOUNTER — Other Ambulatory Visit: Payer: Self-pay

## 2022-07-31 ENCOUNTER — Other Ambulatory Visit: Payer: Self-pay

## 2022-07-31 ENCOUNTER — Observation Stay: Payer: Self-pay

## 2022-07-31 ENCOUNTER — Emergency Department: Payer: Self-pay

## 2022-07-31 ENCOUNTER — Observation Stay
Admission: EM | Admit: 2022-07-31 | Discharge: 2022-07-31 | Disposition: A | Discharge status: Home / Self Care | Payer: Self-pay | Attending: Internal Medicine | Admitting: Internal Medicine

## 2022-07-31 ENCOUNTER — Observation Stay
Admit: 2022-07-31 | Discharge: 2022-07-31 | Disposition: A | Discharge status: Home / Self Care | Payer: Self-pay | Attending: Internal Medicine | Admitting: Internal Medicine

## 2022-07-31 DIAGNOSIS — F17210 Nicotine dependence, cigarettes, uncomplicated: Secondary | ICD-10-CM | POA: Insufficient documentation

## 2022-07-31 DIAGNOSIS — Z23 Encounter for immunization: Secondary | ICD-10-CM | POA: Insufficient documentation

## 2022-07-31 DIAGNOSIS — Z7902 Long term (current) use of antithrombotics/antiplatelets: Secondary | ICD-10-CM | POA: Insufficient documentation

## 2022-07-31 DIAGNOSIS — F1010 Alcohol abuse, uncomplicated: Secondary | ICD-10-CM | POA: Diagnosis present

## 2022-07-31 DIAGNOSIS — Z7982 Long term (current) use of aspirin: Secondary | ICD-10-CM | POA: Insufficient documentation

## 2022-07-31 DIAGNOSIS — E782 Mixed hyperlipidemia: Secondary | ICD-10-CM

## 2022-07-31 DIAGNOSIS — R739 Hyperglycemia, unspecified: Secondary | ICD-10-CM

## 2022-07-31 DIAGNOSIS — Z79899 Other long term (current) drug therapy: Secondary | ICD-10-CM | POA: Insufficient documentation

## 2022-07-31 DIAGNOSIS — R4701 Aphasia: Secondary | ICD-10-CM

## 2022-07-31 DIAGNOSIS — N179 Acute kidney failure, unspecified: Secondary | ICD-10-CM | POA: Diagnosis present

## 2022-07-31 DIAGNOSIS — G459 Transient cerebral ischemic attack, unspecified: Principal | ICD-10-CM | POA: Insufficient documentation

## 2022-07-31 DIAGNOSIS — E785 Hyperlipidemia, unspecified: Secondary | ICD-10-CM | POA: Diagnosis present

## 2022-07-31 DIAGNOSIS — Z8673 Personal history of transient ischemic attack (TIA), and cerebral infarction without residual deficits: Secondary | ICD-10-CM | POA: Insufficient documentation

## 2022-07-31 DIAGNOSIS — I10 Essential (primary) hypertension: Secondary | ICD-10-CM | POA: Diagnosis present

## 2022-07-31 DIAGNOSIS — I129 Hypertensive chronic kidney disease with stage 1 through stage 4 chronic kidney disease, or unspecified chronic kidney disease: Secondary | ICD-10-CM | POA: Insufficient documentation

## 2022-07-31 DIAGNOSIS — N182 Chronic kidney disease, stage 2 (mild): Secondary | ICD-10-CM | POA: Insufficient documentation

## 2022-07-31 DIAGNOSIS — R569 Unspecified convulsions: Secondary | ICD-10-CM

## 2022-07-31 LAB — HEPATIC FUNCTION PANEL
ALT: 137 U/L — ABNORMAL HIGH (ref 0–44)
AST: 148 U/L — ABNORMAL HIGH (ref 15–41)
Albumin: 3.8 g/dL (ref 3.5–5.0)
Alkaline Phosphatase: 90 U/L (ref 38–126)
Bilirubin, Direct: 0.2 mg/dL (ref 0.0–0.2)
Indirect Bilirubin: 0.7 mg/dL (ref 0.3–0.9)
Total Bilirubin: 0.9 mg/dL (ref 0.3–1.2)
Total Protein: 8.2 g/dL — ABNORMAL HIGH (ref 6.5–8.1)

## 2022-07-31 LAB — LIPID PANEL
Cholesterol: 291 mg/dL — ABNORMAL HIGH (ref 0–200)
HDL: 34 mg/dL — ABNORMAL LOW (ref >40)
LDL Cholesterol: 235 mg/dL — ABNORMAL HIGH (ref 0–99)
Total CHOL/HDL Ratio: 8.6 RATIO
Triglycerides: 112 mg/dL (ref <150)
VLDL: 22 mg/dL (ref 0–40)

## 2022-07-31 LAB — CBC
HCT: 43.1 % (ref 39.0–52.0)
Hemoglobin: 14.9 g/dL (ref 13.0–17.0)
MCH: 34.7 pg — ABNORMAL HIGH (ref 26.0–34.0)
MCHC: 34.6 g/dL (ref 30.0–36.0)
MCV: 100.2 fL — ABNORMAL HIGH (ref 80.0–100.0)
Platelets: 238 10*3/uL (ref 150–400)
RBC: 4.3 MIL/uL (ref 4.22–5.81)
RDW: 12.2 % (ref 11.5–15.5)
WBC: 6 10*3/uL (ref 4.0–10.5)
nRBC: 0 % (ref 0.0–0.2)

## 2022-07-31 LAB — PROTIME-INR
INR: 1.1 (ref 0.8–1.2)
Prothrombin Time: 14 seconds (ref 11.4–15.2)

## 2022-07-31 LAB — BASIC METABOLIC PANEL
Anion gap: 12 (ref 5–15)
BUN: 21 mg/dL — ABNORMAL HIGH (ref 6–20)
CO2: 22 mmol/L (ref 22–32)
Calcium: 9.6 mg/dL (ref 8.9–10.3)
Chloride: 99 mmol/L (ref 98–111)
Creatinine, Ser: 1.62 mg/dL — ABNORMAL HIGH (ref 0.61–1.24)
GFR, Estimated: 51 mL/min — ABNORMAL LOW (ref >60)
Glucose, Bld: 160 mg/dL — ABNORMAL HIGH (ref 70–99)
Potassium: 4.5 mmol/L (ref 3.5–5.1)
Sodium: 133 mmol/L — ABNORMAL LOW (ref 135–145)

## 2022-07-31 LAB — HEMOGLOBIN A1C
Hgb A1c MFr Bld: 5.5 % (ref 4.8–5.6)
Mean Plasma Glucose: 111.15 mg/dL

## 2022-07-31 LAB — TROPONIN I (HIGH SENSITIVITY): Troponin I (High Sensitivity): 9 ng/L (ref <18)

## 2022-07-31 MED ORDER — ADULT MULTIVITAMIN W/MINERALS CH: 1.0000 | ORAL_TABLET | Freq: Every day | ORAL | Status: DC

## 2022-07-31 MED ORDER — ASPIRIN 81 MG PO CHEW
324.0000 mg | CHEWABLE_TABLET | Freq: Once | ORAL | Status: DC
  Filled 2022-07-31: qty 4

## 2022-07-31 MED ORDER — CLOPIDOGREL BISULFATE 75 MG PO TABS
75.0000 mg | ORAL_TABLET | Freq: Every day | ORAL | Status: DC
  Filled 2022-07-31: qty 1

## 2022-07-31 MED ORDER — ATORVASTATIN CALCIUM 20 MG PO TABS: 80.0000 mg | ORAL_TABLET | Freq: Every day | ORAL | Status: DC

## 2022-07-31 MED ORDER — ASPIRIN 81 MG PO TBEC: 81.0000 mg | DELAYED_RELEASE_TABLET | Freq: Every day | ORAL | Status: DC

## 2022-07-31 MED ORDER — ACETAMINOPHEN 160 MG/5ML PO SOLN: 650.0000 mg | ORAL | Status: DC | PRN

## 2022-07-31 MED ORDER — STROKE: EARLY STAGES OF RECOVERY BOOK: Freq: Once | Status: DC

## 2022-07-31 MED ORDER — SENNOSIDES-DOCUSATE SODIUM 8.6-50 MG PO TABS: 1.0000 | ORAL_TABLET | Freq: Every evening | ORAL | Status: DC | PRN

## 2022-07-31 MED ORDER — LACTATED RINGERS IV SOLN: INTRAVENOUS | Status: DC

## 2022-07-31 MED ORDER — LEVETIRACETAM 500 MG PO TABS: 2000.0000 mg | ORAL_TABLET | Freq: Two times a day (BID) | ORAL | Status: DC

## 2022-07-31 MED ORDER — ACETAMINOPHEN 325 MG PO TABS: 650.0000 mg | ORAL_TABLET | ORAL | Status: DC | PRN

## 2022-07-31 MED ORDER — ACETAMINOPHEN 650 MG RE SUPP: 650.0000 mg | RECTAL | Status: DC | PRN

## 2022-07-31 MED ORDER — THIAMINE HCL 100 MG PO TABS: 100.0000 mg | ORAL_TABLET | Freq: Every day | ORAL | Status: DC

## 2022-07-31 MED ORDER — IOHEXOL 350 MG/ML SOLN
100.0000 mL | Freq: Once | INTRAVENOUS | Status: AC | PRN
  Administered 2022-07-31: 100 mL via INTRAVENOUS

## 2022-07-31 MED ORDER — LACTATED RINGERS IV BOLUS
1000.0000 mL | Freq: Once | INTRAVENOUS | Status: AC
  Administered 2022-07-31: 1000 mL via INTRAVENOUS

## 2022-07-31 MED ORDER — INFLUENZA VAC SPLIT QUAD 0.5 ML IM SUSY
0.5000 mL | PREFILLED_SYRINGE | INTRAMUSCULAR | Status: AC
  Administered 2022-07-31: 0.5 mL via INTRAMUSCULAR
  Filled 2022-07-31: qty 0.5

## 2022-07-31 MED ORDER — FOLIC ACID 1 MG PO TABS
1.0000 mg | ORAL_TABLET | Freq: Every day | ORAL | Status: DC
  Filled 2022-07-31: qty 1

## 2022-07-31 NOTE — Assessment & Plan Note (Signed)
-   Neurology consulted; appreciate their recommendations - Continue home [place]

## 2022-07-31 NOTE — Progress Notes (Signed)
To MRI in stable condition via w/c with transport

## 2022-07-31 NOTE — Assessment & Plan Note (Addendum)
Patient presenting with a sudden onset and short episode of dizziness, difficulty speaking and finding words, and bilateral leg numbness in the setting of multiple prior CVAs.  Last CVA in March 8756, with embolic pattern of unknown etiology.  Neurology consulted.  Subsequent work-up includes MRI that did not show any acute infarcts.  CTA head/neck demonstrated acute worsening and intracranial atherosclerosis.  Per neurology, presentation consistent with TIA.  Given patient's uncontrolled hyperlipidemia on high intensity statin, patient may benefit from PCSK9 inhibitor.  Counseled to continue aspirin and Plavix and outpatient follow-up with neurology.  Echocardiogram was obtained initial work-up with results still pending.  On discharge, patient was symptom-free.  He was able to ambulate without difficulty independently.

## 2022-07-31 NOTE — Plan of Care (Signed)
  Problem: Education: Goal: Knowledge of disease or condition will improve Outcome: Completed/Met Goal: Knowledge of secondary prevention will improve (MUST DOCUMENT ALL) Outcome: Completed/Met Goal: Knowledge of patient specific risk factors will improve (Mark N/A or DELETE if not current risk factor) Outcome: Completed/Met   Problem: Ischemic Stroke/TIA Tissue Perfusion: Goal: Complications of ischemic stroke/TIA will be minimized Outcome: Completed/Met   Problem: Coping: Goal: Will verbalize positive feelings about self Outcome: Completed/Met Goal: Will identify appropriate support needs Outcome: Completed/Met   Problem: Health Behavior/Discharge Planning: Goal: Ability to manage health-related needs will improve Outcome: Completed/Met Goal: Goals will be collaboratively established with patient/family Outcome: Completed/Met   Problem: Self-Care: Goal: Ability to participate in self-care as condition permits will improve Outcome: Completed/Met Goal: Verbalization of feelings and concerns over difficulty with self-care will improve Outcome: Completed/Met Goal: Ability to communicate needs accurately will improve Outcome: Completed/Met   Problem: Nutrition: Goal: Risk of aspiration will decrease Outcome: Completed/Met Goal: Dietary intake will improve Outcome: Completed/Met   Problem: Education: Goal: Knowledge of General Education information will improve Description: Including pain rating scale, medication(s)/side effects and non-pharmacologic comfort measures Outcome: Completed/Met   Problem: Health Behavior/Discharge Planning: Goal: Ability to manage health-related needs will improve Outcome: Completed/Met   Problem: Clinical Measurements: Goal: Ability to maintain clinical measurements within normal limits will improve Outcome: Completed/Met Goal: Will remain free from infection Outcome: Completed/Met Goal: Diagnostic test results will improve Outcome:  Completed/Met Goal: Respiratory complications will improve Outcome: Completed/Met Goal: Cardiovascular complication will be avoided Outcome: Completed/Met   Problem: Activity: Goal: Risk for activity intolerance will decrease Outcome: Completed/Met   Problem: Nutrition: Goal: Adequate nutrition will be maintained Outcome: Completed/Met   Problem: Coping: Goal: Level of anxiety will decrease Outcome: Completed/Met   Problem: Elimination: Goal: Will not experience complications related to bowel motility Outcome: Completed/Met Goal: Will not experience complications related to urinary retention Outcome: Completed/Met   Problem: Pain Managment: Goal: General experience of comfort will improve Outcome: Completed/Met   Problem: Safety: Goal: Ability to remain free from injury will improve Outcome: Completed/Met   Problem: Skin Integrity: Goal: Risk for impaired skin integrity will decrease Outcome: Completed/Met   

## 2022-07-31 NOTE — ED Notes (Signed)
Patient was witnessed having some abdominal pains. Patient denies N/V/D. Patient stated it felt like cramping.

## 2022-07-31 NOTE — ED Provider Notes (Signed)
[facility] Provider Note    Event Date/Time   First MD Initiated Contact with Patient 07/31/22 1022     (approximate)   History   Chief Complaint Dizziness   HPI  Gary [name] is a 51 y.o. male with past medical history of hypertension, hyperlipidemia, stroke, seizure, CKD, and alcohol abuse who presents to the ED complaining of dizziness.  Patient reports that around 9:00 this morning while he was at work, he had sudden onset of dizziness which she describes as a lightheadedness and feeling he may pass out.  This was associated with difficulty speaking and difficulty getting words out.  He denies any focal weakness in his extremities, does report some numbness in both of his legs, which is chronic from prior stroke.  He additionally reports feeling confused with difficulty recalling things, does report chronic memory difficulties following prior stroke, but reports today was worse than usual.  He was feeling well prior to the onset of symptoms, denies any fevers, cough, chest pain, or shortness of breath.     Physical Exam   Triage Vital Signs: ED Triage Vitals  Enc Vitals Group     BP 07/31/22 1019 106/76     Pulse Rate 07/31/22 1019 86     Resp 07/31/22 1019 17     Temp 07/31/22 1019 98 F (36.7 C)     Temp src --      SpO2 07/31/22 1019 98 %     Weight --      Height --      Head Circumference --      Peak Flow --      Pain Score 07/31/22 1018 0     Pain Loc --      Pain Edu? --      Excl. in GC? --     Most recent vital signs: Vitals:   07/31/22 1130 07/31/22 1200  BP: 103/71   Pulse: 77 70  Resp: 19 17  Temp:    SpO2: 99%     Constitutional: Alert and oriented. Eyes: Conjunctivae are normal. Head: Atraumatic. Nose: No congestion/rhinnorhea. Mouth/Throat: Mucous membranes are moist.  Cardiovascular: Normal rate, regular rhythm. Grossly normal heart sounds.  2+ radial pulses bilaterally. Respiratory: Normal respiratory effort.   No retractions. Lungs CTAB. Gastrointestinal: Soft and nontender. No distention. Musculoskeletal: No lower extremity tenderness nor edema.  Neurologic:  Normal speech and language. No gross focal neurologic deficits are appreciated.    ED Results / Procedures / Treatments   Labs (all labs ordered are listed, but only abnormal results are displayed) Labs Reviewed  BASIC METABOLIC PANEL - Abnormal; Notable for the following components:      Result Value   Sodium 133 (*)    Glucose, Bld 160 (*)    BUN 21 (*)    Creatinine, Ser 1.62 (*)    GFR, Estimated 51 (*)    All other components within normal limits  CBC - Abnormal; Notable for the following components:   MCV 100.2 (*)    MCH 34.7 (*)    All other components within normal limits  URINALYSIS, ROUTINE W REFLEX MICROSCOPIC  PROTIME-INR  CBG MONITORING, ED  TROPONIN I (HIGH SENSITIVITY)     EKG  ED ECG REPORT I, Chesley Noon, the attending physician, personally viewed and interpreted this ECG.   Date: 07/31/2022  EKG Time: 10:19  Rate: 86  Rhythm: normal sinus rhythm  Axis: Normal  Intervals:none  ST&T Change: None  RADIOLOGY  CT head reviewed and interpreted by me with no hemorrhage or midline shift.  PROCEDURES:  Critical Care performed: No  Procedures   MEDICATIONS ORDERED IN ED: Medications  aspirin chewable tablet 324 mg (has no administration in time range)  lactated ringers bolus 1,000 mL (1,000 mLs Intravenous New Bag/Given 07/31/22 1152)     IMPRESSION / MDM / ASSESSMENT AND PLAN / ED COURSE  I reviewed the triage vital signs and the nursing notes.                              51 y.o. male with past medical history of hypertension, hyperlipidemia, stroke, seizure, CKD, and alcohol abuse who presents to the ED complaining of acute onset dizziness and lightheadedness associated with difficulty speaking and acute on chronic confusion with memory difficulty.  Patient's presentation is most  consistent with acute presentation with potential threat to life or bodily function.  Differential diagnosis includes, but is not limited to, stroke, TIA, electrolyte abnormality, AKI, arrhythmia, ACS, anemia.  Patient nontoxic-appearing and in no acute distress, vital signs remarkable for borderline low blood pressure but otherwise reassuring.  EKG shows no evidence of arrhythmia or ischemia and I have lower suspicion for cardiac etiology for symptoms.  We will observe on cardiac monitor and hydrate with IV fluids to see if this helps with his dizziness and borderline low blood pressure.  His difficulty speaking as well as acute on chronic confusion raises concern for possible stroke and we will check CT head, however speaking difficulties and confusion seem to have resolved, making presentation more consistent with TIA.  No indication for code stroke at this time and I doubt large vessel occlusion given no focal weakness, patient is Lucianne Lei negative.  Labs thus far remarkable for mild AKI with no significant anemia or leukocytosis, no significant electrolyte abnormality noted.  CT head is negative for acute process, troponin within normal limits.  Patient with no recurrence of aphasia here in the ED but given concern for TIA with high stroke risk, case discussed with hospitalist for admission.      FINAL CLINICAL IMPRESSION(S) / ED DIAGNOSES   Final diagnoses:  TIA (transient ischemic attack)  Aphasia     Rx / DC Orders   ED Discharge Orders     None        Note:  This document was prepared using Dragon voice recognition software and may include unintentional dictation errors.   Blake Divine, MD 07/31/22 [phone]

## 2022-07-31 NOTE — Assessment & Plan Note (Signed)
Normotensive on presentation.  Restart home antihypertensives on discharge.

## 2022-07-31 NOTE — ED Triage Notes (Signed)
Pt comes via EMS from work with c/o dizziness. Pt states he was at work and felt like he was either going to have stroke or seizure. Pt has hx of both and on meds.   Pt states earlier he had trouble speaking or forming his words. Pt speaking in complete sentences with no issues at this time.   Pt does states some left arm numbness left from prior strokes. Pt states blurry vision but says that is every morning so nothing new.

## 2022-07-31 NOTE — Assessment & Plan Note (Addendum)
Patient presenting with mildly increased creatinine of 1.62, previously 1.19.  Baseline appears to be between 1.2-1.5.  Potentially in the setting of losartan and diuretic use, while continuing daily alcohol use.  - Holding losartan and HCTZ for permissive hypertension - Gentle rehydration - Recheck BMP in the morning

## 2022-07-31 NOTE — ED Notes (Signed)
MD at bedside. 

## 2022-07-31 NOTE — Progress Notes (Signed)
Patient to CT via w/c in stable condition

## 2022-07-31 NOTE — Progress Notes (Signed)
ECHO at bedside.

## 2022-07-31 NOTE — ED Notes (Signed)
See triage note, pt reports was at work and had episode of dizziness with trouble speaking.  Equal grip and strength in all extremities. Denies dizziness or trouble speaking at this time.  NAD noted.

## 2022-07-31 NOTE — Consult Note (Signed)
Neurology Consultation Reason for Consult: Episode of speech impairment Requesting Physician: Dickie La  CC: "I felt like I was having a stroke or seizure"  History is obtained from: Patient and chart review  HPI: Arish Redner is a 51 y.o. 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  He reports that he had forgotten both of his doses of Keppra the day prior to admission and then had taken his morning dose the day of admission.  When he got to work he had an episode where he felt like his legs were numb and his speech was impaired and he was dizzy which lasted about 5 to 10 minutes and spontaneously resolved.  He came to the ED for concern for stroke versus seizure  He was last seen by Dr. Delice Lesch 06/2021 at which time he was noted to be having daily episodes of language impairment.  There was concern about whether these represented seizure versus hypoperfusion due to intracranial atherosclerotic disease particularly feeding the left MCA territory.  His Keppra was uptitrated to 2 g twice daily and there was a plan to try to capture spells with 48-hour ambulatory EEG as well as follow-up in 4 months but he subsequently missed an appointment and has not yet followed up   ROS: All other review of systems was negative except as noted in the HPI, with some concern that he may be minimizing symptoms due to desire to be discharged   Past Medical History:  Diagnosis Date   Alcohol use    Carotid artery occlusion    GERD (gastroesophageal reflux disease)    Hypercholesteremia    Hypertension    Seizures ([place])    Stroke ([place])    TIA 12/29/20, re-admitted 01/14/21 for sequelae   Tobacco abuse    Past Surgical  History:  Procedure Laterality Date   NO PAST SURGERIES     Current Outpatient Medications  Medication Instructions   amLODipine (NORVASC) 10 mg, Oral, Every evening   aspirin 81 mg, Oral, Daily   atorvastatin (LIPITOR) 80 MG tablet TAKE ONE TABLET BY MOUTH ONCE NIGHTLY AT BEDTIME.   blood glucose meter kit and supplies KIT Use as directed to check blood glucose 2-3 times per week and as needed   carvedilol (COREG) 25 mg, Oral, 2 times daily with meals   clopidogrel (PLAVIX) 75 MG tablet TAKE ONE TABLET BY MOUTH ONCE EVERY DAY.   folic acid (FOLVITE) 1 mg, Oral, Daily   glucose blood (RIGHTEST GS550 BLOOD GLUCOSE) test strip Use as directed to check blood glucose 2-3 times per week and as needed   hydrochlorothiazide (HYDRODIURIL) 25 mg, Oral, Daily   ibuprofen (ADVIL) 600 mg, Oral, Daily   levETIRAcetam (KEPPRA) 1000 MG tablet Take 2 tablets (2,000 mg total) by mouth twice daily.   losartan (COZAAR) 100 mg, Oral, Every morning   magnesium oxide (MAG-OX) 400 mg, Oral, Daily   Multiple Vitamins-Minerals (MULTIVITAMIN) tablet 1 tablet, Oral, Daily   omeprazole (PRILOSEC) 40 mg, Oral, Daily   Rightest GL300 Lancets MISC Use as directed to check blood glucose 2-3 times per week and as needed   sildenafil (VIAGRA) 100 MG tablet Take 1 tablet 1 hour prior to intercourse.   thiamine (VITAMIN B1) 100 mg, Oral, Daily   vitamin B-12 (CYANOCOBALAMIN) 1,000 mcg,  Oral, Daily     Family History  Problem Relation Age of Onset   Hypertension Mother        pt reports mother has stent   Other Father        unknown medical history   Healthy Sister    Other Maternal Grandmother        unknown medical history   Other Maternal Grandfather 89       choked on a watermelon seed   Other Paternal Grandmother        unknown medical history   Other Paternal Grandfather        unknown medical history    Social History:  reports that he has been smoking cigarettes. He has a 8.00 pack-year smoking  history. He has never used smokeless tobacco. He reports current alcohol use of about 21.0 standard drinks of alcohol per week. He reports that he does not use drugs.  Exam: Current vital signs: BP 103/71   Pulse 70   Temp 98 F (36.7 C)   Resp 17   SpO2 99%  Vital signs in last 24 hours: Temp:  [98 F (36.7 C)] 98 F (36.7 C) (10/24 1019) Pulse Rate:  [70-86] 70 (10/24 1200) Resp:  [15-22] 17 (10/24 1200) BP: (96-106)/(68-76) 103/71 (10/24 1130) SpO2:  [98 %-99 %] 99 % (10/24 1130)   Physical Exam  Constitutional: Appears well-developed and well-nourished.  Psych: Affect calm and cooperative but anxious for discharge Eyes: Mild scleral injection HENT: No oropharyngeal obstruction.  MSK: no joint deformities.  Cardiovascular: Perfusing extremities well Respiratory: Effort normal, non-labored breathing GI: Soft.  No distension. There is no tenderness.  Skin: Warm dry and intact visible skin  Neuro: Mental Status: Patient is awake, alert, oriented to person, place, month, age, and situation. Patient is able to give a clear and coherent history. No signs of aphasia or neglect Cranial Nerves: II: Visual Fields are full to orienting to stimuli in all visual fields. Pupils are equal, round, and reactive to light.   III,IV, VI: EOMI without ptosis or diploplia, though he does have saccadic pursuits.  V: Facial sensation is symmetric to temperature and light touch VII: Facial movement is symmetric.  VIII: hearing is intact to voice X: Uvula elevates symmetrically XI: Shoulder shrug is symmetric. XII: tongue is midline without atrophy or fasciculations.  Motor: Tone is normal. Bulk is normal. 5/5 strength was present in all four extremities. Sensory: Sensation is symmetric to light touch and temperature in the arms and legs. Deep Tendon Reflexes: 2+ and symmetric in the biceps Cerebellar: FNF and HKS are intact bilaterally Gait:  Deferred  NIHSS total 0 Performed at  4:45 PM    I have reviewed labs in epic and the results pertinent to this consultation are:  Basic Metabolic Panel: Recent Labs  Lab 07/31/22 1019  NA 133*  K 4.5  CL 99  CO2 22  GLUCOSE 160*  BUN 21*  CREATININE 1.62*  CALCIUM 9.6   Lab Results  Component Value Date   ALT 137 (H) 07/31/2022   AST 148 (H) 07/31/2022   ALKPHOS 90 07/31/2022   BILITOT 0.9 07/31/2022     CBC: Recent Labs  Lab 07/31/22 1019  WBC 6.0  HGB 14.9  HCT 43.1  MCV 100.2*  PLT 238    Coagulation Studies: Recent Labs    07/31/22 1151  LABPROT 14.0  INR 1.1    Lab Results  Component Value Date   CHOL 291 (H) 07/31/2022  HDL 34 (L) 07/31/2022   LDLCALC 235 (H) 07/31/2022   TRIG 112 07/31/2022   CHOLHDL 8.6 07/31/2022   Lab Results  Component Value Date   HGBA1C 5.5 02/22/2022     I have reviewed the images obtained:  MRI brain personally reviewed agree with radiology that there is no acute intracranial process 1. No evidence of acute intracranial abnormality. 2. Remote right PCA, left MCA, and right frontoparietal infarcts. 3. Left ostiomeatal unit pattern sinus opacification.  CTA head and neck personally reviewed, agree with radiology: Mild atherosclerotic disease of the carotid bifurcation bilaterally without stenosis. Mild to moderate stenosis of the cavernous carotid bilaterally.  Mild stenosis right M1 segment unchanged.  Diffusely diseased left M1 segment and left MCA branches which are small and irregular. Decreased perfusion of left MCA branches compared to the prior study suggesting progressive stenosis.  Negative for intracranial large vessel occlusion.    Impression: Patient presenting with transient neurological events concerning for breakthrough seizures in the setting of missed Keppra doses, versus symptomatic hypoperfusion in the setting of his progressive left MCA territory atherosclerotic disease, which is taking on a secondary moyamoya appearance.   Given his significant cardiac risk factors and tendency to minimize his symptoms, I do think confirming that his cardiac function is stable is important and we are still awaiting echocardiogram read at this time.  Additionally I feel like he would benefit from further counseling about the significant danger of ongoing smoking given the progression of his intracranial atherosclerotic disease  Recommendations: -Continue dual antiplatelet therapy at this time -Continue home Keppra 2000 mg twice daily -Will follow-up echocardiogram read -Strict avoidance of hypotension, attention to good hydration -Continued counseling on alcohol and tobacco use cessation -B12 and MMA levels, supplement for goal B12 greater than 600 -LDL is not meeting goal < 70 at 235 and has in fact increased slightly from prior check, will need referral for consideration of PSK 9 inhibitor especially if he is already adherent to his statin which is maximized at 80 mg daily -A1c is meeting goal -Neurology will continue to follow  [place] [phone] [facility] coverage for [facility] is from 8 AM to 4 AM in-house and 4 PM to 8 PM by telephone/video. 8 PM to 8 AM emergent questions or overnight urgent questions should be addressed to Teleneurology On-call or [facility] neurohospitalist; contact information can be found on AMION

## 2022-07-31 NOTE — Assessment & Plan Note (Signed)
A1c pending.

## 2022-07-31 NOTE — Assessment & Plan Note (Signed)
Patient states he has been working to cut down on how much alcohol he is drinking but has been struggling to do so.  He is currently having at least 3 mixed drinks per day.  This is something he has been discussing with his PCP.  Previous history of elevated LFTs.  No history of cirrhosis.  - Hepatic panel pending - CIWA monitoring

## 2022-07-31 NOTE — Discharge Summary (Signed)
Physician Discharge Summary   Patient: Gary [name] MRN: [id number] DOB: 1971-08-21  Admit date:     07/31/2022  Discharge date: 07/31/22  Discharge Physician: Jose Persia   PCP: Wolfgang Phoenix, NP   Recommendations at discharge:  {Tip this will not be part of the note when signed- Example include specific recommendations for outpatient follow-up, pending tests to follow-up on. (Optional):26781}  ***  Discharge Diagnoses: Principal Problem:   TIA (transient ischemic attack) Active Problems:   Acute renal failure superimposed on stage 2 chronic kidney disease (HCC)   Seizures (HCC)   Alcohol abuse   Hypertension   Hyperlipidemia   Hyperglycemia  Resolved Problems:   * No resolved hospital problems. [facility] Course: No notes on file  Assessment and Plan: * TIA (transient ischemic attack) Patient presenting with a sudden onset and short episode of dizziness, difficulty speaking and finding words, and bilateral leg numbness in the setting of multiple prior CVAs.  Last CVA in March 3151, with embolic pattern of unknown etiology.  At that time work-up did not reveal any atrial fibrillation or PFO.  Per chart review and patient history, these episodes have been recurrent.  Will work-up for possible TIA/CVA. Differential also includes focal seizure, however patient's prior seizures were quite different from this.   - Neurology consulted; appreciate their recommendations - Telemetry monitoring - MRI brain pending - Echocardiogram pending - Continue home aspirin, Plavix - Hold home antihypertensives to allow for permissive hypertension until work-up is completed - PT/OT  Acute renal failure superimposed on stage 2 chronic kidney disease ([place]) Patient presenting with mildly increased creatinine of 1.62, previously 1.19.  Baseline appears to be between 1.2-1.5.  Potentially in the setting of losartan and diuretic use, while continuing daily alcohol use.  - Holding losartan  and HCTZ for permissive hypertension - Gentle rehydration - Recheck BMP in the morning  Seizures [facility]) - Neurology consulted; appreciate their recommendations - Continue home [place]  Alcohol abuse Patient states he has been working to cut down on how much alcohol he is drinking but has been struggling to do so.  He is currently having at least 3 mixed drinks per day.  This is something he has been discussing with his PCP.  Previous history of elevated LFTs.  No history of cirrhosis.  - Hepatic panel pending - CIWA monitoring  Hypertension Normotensive on presentation.  Holding home antihypertensives to allow for permissive hypertension while working up for TIA/CVA.  -Holding home Coreg, losartan and HCTZ.  Hyperlipidemia Markedly elevated LDL despite high intensity atorvastatin.  May ultimately benefit from addition of Zetia.  - Continue home atorvastatin - Repeat lipid panel pending  Hyperglycemia - A1c pending      {Tip this will not be part of the note when signed Body mass index is 29.27 kg/m. , ,  (Optional):26781}  {(NOTE) Pain control PDMP Statment (Optional):26782} Consultants: *** Procedures performed: ***  Disposition: {Plan; Disposition:26390} Diet recommendation:  Discharge Diet Orders (From admission, onward)     Start     Ordered   07/31/22 0000  Diet - low sodium heart healthy        07/31/22 1907           {Diet_Plan:26776} DISCHARGE MEDICATION: Allergies as of 07/31/2022   No Known Allergies      Medication List     TAKE these medications    amLODipine 10 MG tablet Commonly known as: NORVASC Take 1 tablet (10 mg total) by mouth every evening.  aspirin 81 MG tablet Take 1 tablet (81 mg total) by mouth daily.   atorvastatin 80 MG tablet Commonly known as: LIPITOR TAKE ONE TABLET BY MOUTH ONCE NIGHTLY AT BEDTIME.   carvedilol 25 MG tablet Commonly known as: COREG Take 1 tablet (25 mg total) by mouth 2 (two) times daily with a  meal.   clopidogrel 75 MG tablet Commonly known as: PLAVIX TAKE ONE TABLET BY MOUTH ONCE EVERY DAY.   folic acid 1 MG tablet Commonly known as: FOLVITE Take 1 tablet (1 mg total) by mouth daily.   hydrochlorothiazide 25 MG tablet Commonly known as: HYDRODIURIL Take 1 tablet (25 mg total) by mouth daily.   ibuprofen 200 MG tablet Commonly known as: ADVIL Take 600 mg by mouth daily.   levETIRAcetam 1000 MG tablet Commonly known as: KEPPRA Take 2 tablets (2,000 mg total) by mouth twice daily.   losartan 100 MG tablet Commonly known as: Cozaar Take 1 tablet (100 mg total) by mouth in the morning.   magnesium oxide 400 MG tablet Commonly known as: MAG-OX Take 1 tablet (400 mg total) by mouth daily.   multivitamin tablet Take 1 tablet by mouth daily.   omeprazole 40 MG capsule Commonly known as: PRILOSEC Take 1 capsule (40 mg total) by mouth daily.   Rightest GL300 Lancets Misc Use as directed to check blood glucose 2-3 times per week and as needed   Rightest GM550 Blood Glucose w/Device Kit Use as directed to check blood glucose 2-3 times per week and as needed   Rightest GS550 Blood Glucose test strip Generic drug: glucose blood Use as directed to check blood glucose 2-3 times per week and as needed   sildenafil 100 MG tablet Commonly known as: VIAGRA Take 1 tablet 1 hour prior to intercourse.   thiamine 100 MG tablet Commonly known as: VITAMIN B1 Take 1 tablet (100 mg total) by mouth daily.   vitamin B-12 500 MCG tablet Commonly known as: CYANOCOBALAMIN Take 2 tablets (1,000 mcg total) by mouth daily.        Discharge Exam: Filed Weights   07/31/22 1336  Weight: 95.2 kg   ***  Condition at discharge: {DC Condition:26389}  The results of significant diagnostics from this hospitalization (including imaging, microbiology, ancillary and laboratory) are listed below for reference.   Imaging Studies: CT ANGIO HEAD NECK W WO CM  Result Date:  07/31/2022 CLINICAL DATA:  Stroke follow-up EXAM: CT ANGIOGRAPHY HEAD AND NECK TECHNIQUE: Multidetector CT imaging of the head and neck was performed using the standard protocol during bolus administration of intravenous contrast. Multiplanar CT image reconstructions and MIPs were obtained to evaluate the vascular anatomy. Carotid stenosis measurements (when applicable) are obtained utilizing NASCET criteria, using the distal internal carotid diameter as the denominator. RADIATION DOSE REDUCTION: This exam was performed according to the departmental dose-optimization program which includes automated exposure control, adjustment of the mA and/or kV according to patient size and/or use of iterative reconstruction technique. CONTRAST:  181m OMNIPAQUE IOHEXOL 350 MG/ML SOLN COMPARISON:  MRI head 07/31/2022. CT angio head and neck 12/29/2020. CT head 07/31/2022 FINDINGS: CTA NECK FINDINGS Aortic arch: Standard branching. Imaged portion shows no evidence of aneurysm or dissection. No significant stenosis of the major arch vessel origins. Right carotid system: Mild atherosclerotic disease right carotid bifurcation and right internal carotid artery without significant stenosis Left carotid system: Mild atherosclerotic disease left carotid bifurcation without stenosis. Vertebral arteries: Both vertebral arteries patent to the skull base without stenosis. Skeleton: Mild cervical  spondylosis.  No acute abnormality. Other neck: Negative for mass or adenopathy in the neck. Upper chest: Lung apices clear bilaterally Review of the MIP images confirms the above findings CTA HEAD FINDINGS Anterior circulation: Atherosclerotic calcification cavernous carotid bilaterally. Mild to moderate stenosis of the cavernous carotid bilaterally. Mild atherosclerotic stenosis right M1 segment unchanged. Hypoplastic right A1 segment. Both anterior cerebral arteries patent and supplied from the left Diffusely diseased left M1 segment and left MCA  branches which are small and irregular. Decreased perfusion of left MCA branches compared to the prior study suggesting progressive stenosis. Posterior circulation: Left vertebral artery supplies the basilar. Hypoplastic distal right vertebral artery with minimal contribution to the basilar. PICA patent bilaterally. Basilar patent. Superior cerebellar and posterior cerebral arteries patent bilaterally. Fetal origin of the posterior cerebral artery on the right. Venous sinuses: Normal venous enhancement Anatomic variants: None Review of the MIP images confirms the above findings IMPRESSION: Mild atherosclerotic disease of the carotid bifurcation bilaterally without stenosis. Mild to moderate stenosis of the cavernous carotid bilaterally. Mild stenosis right M1 segment unchanged. Diffusely diseased left M1 segment and left MCA branches which are small and irregular. Decreased perfusion of left MCA branches compared to the prior study suggesting progressive stenosis. Negative for intracranial large vessel occlusion. Electronically Signed   By: Franchot Gallo M.D.   On: 07/31/2022 18:33   MR BRAIN WO CONTRAST  Result Date: 07/31/2022 CLINICAL DATA:  Neuro deficit, acute, stroke suspected EXAM: MRI HEAD WITHOUT CONTRAST TECHNIQUE: Multiplanar, multiecho pulse sequences of the brain and surrounding structures were obtained without intravenous contrast. COMPARISON:  Same day CT head. FINDINGS: Brain: No acute infarction, hemorrhage, hydrocephalus, extra-axial collection or mass lesion. Remote right PCA territory infarct. Remote right frontoparietal infarct. Remote left MCA territory infarcts. Surrounding gliosis. Vascular: Major arterial flow voids are maintained at the skull base. Skull and upper cervical spine: Normal marrow signal. Sinuses/Orbits: Completely opacified left frontal sinus. Opacified left anterior ethmoid air cells and left maxillary sinus. Other: No mastoid effusions. IMPRESSION: 1. No evidence of  acute intracranial abnormality. 2. Remote right PCA, left MCA, and right frontoparietal infarcts. 3. Left ostiomeatal unit pattern sinus opacification. Electronically Signed   By: Margaretha Sheffield M.D.   On: 07/31/2022 16:08   CT HEAD WO CONTRAST (5MM)  Result Date: 07/31/2022 CLINICAL DATA:  Dizziness, blurry vision, possible seizure. Headache. EXAM: CT HEAD WITHOUT CONTRAST TECHNIQUE: Contiguous axial images were obtained from the base of the skull through the vertex without intravenous contrast. RADIATION DOSE REDUCTION: This exam was performed according to the departmental dose-optimization program which includes automated exposure control, adjustment of the mA and/or kV according to patient size and/or use of iterative reconstruction technique. COMPARISON:  December 29, 2020. FINDINGS: Brain: Stable right posterior parietal and occipital low density is noted consistent with old infarction. Left frontal parietal encephalomalacia is now noted consistent with old infarction. No mass effect or midline shift is noted. Ventricular size is within normal limits. There is no evidence of mass lesion, hemorrhage or acute infarction. Vascular: No hyperdense vessel or unexpected calcification. Skull: Normal. Negative for fracture or focal lesion. Sinuses/Orbits: There remains complete opacification of the left maxillary sinus as well as significant mucosal thickening of the left ethmoid and frontal sinuses. Other: None. IMPRESSION: Bilateral cephalo malacia is noted as described above, most consistent with old infarctions. No acute intracranial abnormality is noted. Stable mucosal thickening involving the left maxillary, ethmoid and frontal sinuses. Electronically Signed   By: Bobbe Medico.D.  On: 07/31/2022 11:48    Microbiology: Results for orders placed or performed during the hospital encounter of 12/29/20  SARS CORONAVIRUS 2 (TAT 6-24 HRS) Nasopharyngeal Nasopharyngeal Swab     Status: None   Collection  Time: 12/29/20  3:24 PM   Specimen: Nasopharyngeal Swab  Result Value Ref Range Status   SARS Coronavirus 2 NEGATIVE NEGATIVE Final    Comment: (NOTE) SARS-CoV-2 target nucleic acids are NOT DETECTED.  The SARS-CoV-2 RNA is generally detectable in upper and lower respiratory specimens during the acute phase of infection. Negative results do not preclude SARS-CoV-2 infection, do not rule out co-infections with other pathogens, and should not be used as the sole basis for treatment or other patient management decisions. Negative results must be combined with clinical observations, patient history, and epidemiological information. The expected result is Negative.  Fact Sheet for Patients: SugarRoll.be  Fact Sheet for Healthcare Providers: https://www.woods-mathews.com/  This test is not yet approved or cleared by the [place] FDA and  has been authorized for detection and/or diagnosis of SARS-CoV-2 by FDA under an Emergency Use Authorization (EUA). This EUA will remain  in effect (meaning this test can be used) for the duration of the COVID-19 declaration under Se ction 564(b)(1) of the Act, 21 U.S.C. section 360bbb-3(b)(1), unless the authorization is terminated or revoked sooner.  Performed at [facility], [place] [address], [place], [place] [address]     Labs: CBC: Recent Labs  Lab 07/31/22 1019  WBC 6.0  HGB 14.9  HCT 43.1  MCV 100.2*  PLT 741   Basic Metabolic Panel: Recent Labs  Lab 07/31/22 1019  NA 133*  K 4.5  CL 99  CO2 22  GLUCOSE 160*  BUN 21*  CREATININE 1.62*  CALCIUM 9.6   Liver Function Tests: Recent Labs  Lab 07/31/22 1528  AST 148*  ALT 137*  ALKPHOS 90  BILITOT 0.9  PROT 8.2*  ALBUMIN 3.8   CBG: No results for input(s): "GLUCAP" in the last 168 hours.  Discharge time spent: less than 30 minutes.  Signed: Jose Persia, MD [facility] 07/31/2022

## 2022-07-31 NOTE — Progress Notes (Signed)
Patient returned from MRI in stable condition

## 2022-07-31 NOTE — Progress Notes (Signed)
Patient states he drinks about 3 mixed drinks daily and has for "years." States he has never experienced withdraw symptoms. Encouraged to report any symptoms to nursing staff.

## 2022-07-31 NOTE — Progress Notes (Signed)
PT Cancellation Note  Patient Details Name: Gary [name] MRN: [id number] DOB: 03-30-1971   Cancelled Treatment:    Reason Eval/Treat Not Completed: Other (comment). Orders received and chart reviewed. Per RN pt fully indep ambulating in room. No acute PT needs identified. PT to sign off. Please re-consult if change in mobility status occurs.    Salem Caster. Fairly IV, PT, DPT Physical Therapist- [facility]  07/31/2022, 3:34 PM

## 2022-07-31 NOTE — ED Triage Notes (Signed)
First Nurse: Pt here via [facility] with a possible seizure. Pt state he could not speak or form words. Negative stroke screen per ems. Pt has a headache, pt has a hx of a blood clot in his brain, on Eliquis. Pt also take Keppra for his seizures.   227-cbg 118/71

## 2022-07-31 NOTE — H&P (Addendum)
History and Physical    Patient: Gary [name] CEY:[id number] DOB: 1970/10/23 DOA: 07/31/2022 DOS: the patient was seen and examined on 07/31/2022 PCP: Wolfgang Phoenix, NP  Patient coming from: Home  Chief Complaint:  Chief Complaint  Patient presents with   Dizziness   HPI: Gary [name] is a 51 y.o. male with medical history significant of multiple CVA, seizure disorder, hypertension, PA D, hyperlipidemia, alcohol abuse, who presents to the ED with complaints of dizziness.  Mr. Kinnan states he was in his usual state of health this morning when he went to work.  Shortly after arriving, he had sudden onset dizziness that he describes as "not feeling right" in addition to inability to speak.  After that, he noted difficulty finding words.  In addition, he felt that his bilateral legs were numb and when he got up to walk, he could not tell where he was stepping.  He denies any lower extremity weakness though.  He states symptoms lasted approximately 5 minutes.  After symptoms resolved, he felt back to baseline.  He denies any facial numbness or weakness, new vision changes, difficulty swallowing, arm weakness, headaches.  He denies any loss of consciousness.  He notes that he had a similar episode approximately 1 month ago for which she did not seek evaluation for.  He has been taking his Plavix as prescribed, however he does occasionally miss doses 1-2 times per week, including yesterday.  He has not missed any doses of his a blood pressure medication or Keppra.  ED course: On arrival to the ED, Patient was normotensive at 96/68 with heart rate of 80.  Initial work-up notable for BUN of 21 creatinine of 1.62, MCV of 100.2.  CT head obtained with no acute intracranial abnormalities.  Due to history of multiple CVA, [facility] contacted for admission for TIA evaluation.  Review of Systems: As mentioned in the history of present illness. All other systems reviewed and are negative.  Past Medical  History:  Diagnosis Date   Alcohol use    Carotid artery occlusion    GERD (gastroesophageal reflux disease)    Hypercholesteremia    Hypertension    Seizures ([place])    Stroke ([place])    TIA 12/29/20, re-admitted 01/14/21 for sequelae   Tobacco abuse    Past Surgical History:  Procedure Laterality Date   NO PAST SURGERIES     Social History:  reports that he has been smoking cigarettes. He has a 8.00 pack-year smoking history. He has never used smokeless tobacco. He reports current alcohol use of about 21.0 standard drinks of alcohol per week. He reports that he does not use drugs.  No Known Allergies  Family History  Problem Relation Age of Onset   Hypertension Mother        pt reports mother has stent   Other Father        unknown medical history   Healthy Sister    Other Maternal Grandmother        unknown medical history   Other Maternal Grandfather 89       choked on a watermelon seed   Other Paternal Grandmother        unknown medical history   Other Paternal Grandfather        unknown medical history    Prior to Admission medications   Medication Sig Start Date End Date Taking? Authorizing Provider  amLODipine (NORVASC) 10 MG tablet Take 1 tablet (10 mg total) by mouth every evening. 07/12/22  Yes Tukov-Yual, Magdalene S, NP  aspirin 81 MG tablet Take 1 tablet (81 mg total) by mouth daily. 11/01/17  Yes Azzie Glatter, FNP  atorvastatin (LIPITOR) 80 MG tablet TAKE ONE TABLET BY MOUTH ONCE NIGHTLY AT BEDTIME. 11/23/21  Yes Iloabachie, Chioma E, NP  carvedilol (COREG) 25 MG tablet Take 1 tablet (25 mg total) by mouth 2 (two) times daily with a meal. 07/12/22  Yes Tukov-Yual, Magdalene S, NP  clopidogrel (PLAVIX) 75 MG tablet TAKE ONE TABLET BY MOUTH ONCE EVERY DAY. 06/07/22 06/07/23 Yes Iloabachie, Chioma E, NP  folic acid (FOLVITE) 1 MG tablet Take 1 tablet (1 mg total) by mouth daily. 07/12/22  Yes Tukov-Yual, Magdalene S, NP  hydrochlorothiazide (HYDRODIURIL) 25 MG tablet  Take 1 tablet (25 mg total) by mouth daily. 07/19/22  Yes Tukov-Yual, Arlyss Gandy, NP  levETIRAcetam (KEPPRA) 1000 MG tablet Take 2 tablets (2,000 mg total) by mouth twice daily. 02/22/22  Yes Tukov-Yual, Arlyss Gandy, NP  losartan (COZAAR) 100 MG tablet Take 1 tablet (100 mg total) by mouth in the morning. 07/12/22  Yes Tukov-Yual, Magdalene S, NP  magnesium oxide (MAG-OX) 400 MG tablet Take 1 tablet (400 mg total) by mouth daily. 07/12/22  Yes Tukov-Yual, Arlyss Gandy, NP  Multiple Vitamins-Minerals (MULTIVITAMIN) tablet Take 1 tablet by mouth daily. 07/12/22  Yes Tukov-Yual, Arlyss Gandy, NP  omeprazole (PRILOSEC) 40 MG capsule Take 1 capsule (40 mg total) by mouth daily. 07/12/22  Yes Tukov-Yual, Arlyss Gandy, NP  thiamine (VITAMIN B1) 100 MG tablet Take 1 tablet (100 mg total) by mouth daily. 07/12/22  Yes Tukov-Yual, Arlyss Gandy, NP  vitamin B-12 (CYANOCOBALAMIN) 500 MCG tablet Take 2 tablets (1,000 mcg total) by mouth daily. 07/12/22  Yes Tukov-Yual, Magdalene S, NP  blood glucose meter kit and supplies KIT Use as directed to check blood glucose 2-3 times per week and as needed Patient not taking: Reported on 07/19/2022 02/22/22   Erlene Quan, NP  glucose blood (RIGHTEST GS550 BLOOD GLUCOSE) test strip Use as directed to check blood glucose 2-3 times per week and as needed Patient not taking: Reported on 07/19/2022 02/22/22   Tukov-Yual, Arlyss Gandy, NP  ibuprofen (ADVIL) 200 MG tablet Take 600 mg by mouth daily.    [provider]  Rightest GL300 Lancets MISC Use as directed to check blood glucose 2-3 times per week and as needed Patient not taking: Reported on 07/19/2022 02/22/22   Tukov-Yual, Arlyss Gandy, NP  sildenafil (VIAGRA) 100 MG tablet Take 1 tablet 1 hour prior to intercourse. 07/02/22   Abbie Sons, MD    Physical Exam: Vitals:   07/31/22 1130 07/31/22 1200 07/31/22 1336 07/31/22 1638  BP: 103/71  110/70 116/70  Pulse: 77 70 80 75  Resp: 19 17 18 18   Temp:   98.8  F (37.1 C) 98 F (36.7 C)  TempSrc:   Oral   SpO2: 99%  98% 99%  Weight:   95.2 kg   Height:   5' 11"  (1.803 m)    Physical Exam Vitals and nursing note reviewed.  Constitutional:      General: He is not in acute distress.    Appearance: He is normal weight. He is not toxic-appearing.  HENT:     Head: Normocephalic and atraumatic.     Mouth/Throat:     Mouth: Mucous membranes are moist.     Pharynx: Oropharynx is clear.  Eyes:     General: No scleral icterus.    Extraocular Movements: Extraocular movements  intact.     Conjunctiva/sclera: Conjunctivae normal.     Pupils: Pupils are equal, round, and reactive to light.  Cardiovascular:     Rate and Rhythm: Normal rate and regular rhythm.     Heart sounds: No murmur heard.    No gallop.  Pulmonary:     Effort: Pulmonary effort is normal. No respiratory distress.     Breath sounds: Normal breath sounds. No wheezing, rhonchi or rales.  Abdominal:     General: Bowel sounds are normal. There is no distension.     Palpations: Abdomen is soft.     Tenderness: There is no abdominal tenderness. There is no guarding.  Musculoskeletal:     Cervical back: Neck supple.     Right lower leg: No edema.     Left lower leg: No edema.  Skin:    General: Skin is warm and dry.  Neurological:     Mental Status: He is alert.     Comments:  Alert and oriented x4 Sensation intact throughout Cranial nerves II through XII intact 5 out of 5 strength in all 4 extremities Able to appropriately name common objects in the room  Psychiatric:        Mood and Affect: Mood normal.        Behavior: Behavior normal.        Thought Content: Thought content normal.        Judgment: Judgment normal.    Data Reviewed: CBC remarkable for MCV of 100.2.  No evidence of anemia.  BMP remarkable for creatinine of 1.62 previously 1.19, and BUN of 21.  Sodium slightly low at 133.  Glucose elevated at 160.  Initial troponin negative.  PT/INR within normal  limits.  CT head obtained that demonstrates bilateral cephalomalacia consistent with old infarcts.  No acute intracranial abnormality.  EKG personally reviewed.  Sinus rhythm with a rate of 86.  No acute ST or T wave changes to suggest acute ischemia.  Results are pending, will review when available.  Assessment and Plan: * TIA (transient ischemic attack) Patient presenting with a sudden onset and short episode of dizziness, difficulty speaking and finding words, and bilateral leg numbness in the setting of multiple prior CVAs.  Last CVA in March 4827, with embolic pattern of unknown etiology.  At that time work-up did not reveal any atrial fibrillation or PFO.  Per chart review and patient history, these episodes have been recurrent.  Will work-up for possible TIA/CVA. Differential also includes focal seizure, however patient's prior seizures were quite different from this.   - Neurology consulted; appreciate their recommendations - Telemetry monitoring - MRI brain pending - Echocardiogram pending - Continue home aspirin, Plavix - Hold home antihypertensives to allow for permissive hypertension until work-up is completed - PT/OT  Acute renal failure superimposed on stage 2 chronic kidney disease ([place]) Patient presenting with mildly increased creatinine of 1.62, previously 1.19.  Baseline appears to be between 1.2-1.5.  Potentially in the setting of losartan and diuretic use, while continuing daily alcohol use.  - Holding losartan and HCTZ for permissive hypertension - Gentle rehydration - Recheck BMP in the morning  Seizures [facility]) - Neurology consulted; appreciate their recommendations - Continue home [place]  Alcohol abuse Patient states he has been working to cut down on how much alcohol he is drinking but has been struggling to do so.  He is currently having at least 3 mixed drinks per day.  This is something he has been discussing with his  PCP.  Previous history of elevated LFTs.   No history of cirrhosis.  - Hepatic panel pending - CIWA monitoring  Hypertension Normotensive on presentation.  Holding home antihypertensives to allow for permissive hypertension while working up for TIA/CVA.  -Holding home Coreg, losartan and HCTZ.  Hyperlipidemia Markedly elevated LDL despite high intensity atorvastatin.  May ultimately benefit from addition of Zetia.  - Continue home atorvastatin - Repeat lipid panel pending  Hyperglycemia - A1c pending   Advance Care Planning:   Code Status: Full Code   Consults: Neurology  Family Communication: No Emly at bedside present  Severity of Illness: The appropriate patient status for this patient is OBSERVATION. Observation status is judged to be reasonable and necessary in order to provide the required intensity of service to ensure the patient's safety. The patient's presenting symptoms, physical exam findings, and initial radiographic and laboratory data in the context of their medical condition is felt to place them at decreased risk for further clinical deterioration. Furthermore, it is anticipated that the patient will be medically stable for discharge from the hospital within 2 midnights of admission.   Author: Jose Persia, MD 07/31/2022 6:13 PM  For on call review [email or web address].

## 2022-07-31 NOTE — ED Notes (Signed)
Pt to CT

## 2022-07-31 NOTE — Assessment & Plan Note (Signed)
Markedly elevated LDL despite high intensity atorvastatin.  May ultimately benefit from addition of Zetia.  - Continue home atorvastatin - Repeat lipid panel pending

## 2022-07-31 NOTE — Discharge Instructions (Signed)
[name], Gary [name] were admitted to the hospital for further evaluation after having dizziness, difficulty speaking and leg numbness this morning.  Your brain scans showed that your vessels are more narrow than before, which places you at very high risk of having additional strokes.  It will be extremely important to take aspirin and Plavix daily without missing any doses.  In addition, you would benefit from a referral to the cholesterol clinic.  Continue working on cutting down on alcohol use.   Please make sure to follow-up with your primary care doctor to discuss this further.  If you develop any additional episodes of dizziness, difficulty speaking, leg weakness or numbness, please call 911.  It was a pleasure meeting you and thank you for allowing me to take part in your care.  Sincerely, Dr. Charleen Kirks

## 2022-08-01 LAB — ECHOCARDIOGRAM COMPLETE
AR max vel: 3.22 cm2
AV Area VTI: 3.59 cm2
AV Area mean vel: 3.18 cm2
AV Mean grad: 2 mmHg
AV Peak grad: 4.2 mmHg
Ao pk vel: 1.03 m/s
Area-P 1/2: 3.54 cm2
S' Lateral: 2.6 cm

## 2022-08-01 LAB — HIV ANTIBODY (ROUTINE TESTING W REFLEX): HIV Screen 4th Generation wRfx: NONREACTIVE

## 2022-08-02 ENCOUNTER — Ambulatory Visit: Payer: Self-pay | Admitting: Gerontology

## 2022-08-02 ENCOUNTER — Other Ambulatory Visit: Payer: Self-pay

## 2022-08-13 ENCOUNTER — Other Ambulatory Visit: Payer: Self-pay

## 2022-08-16 ENCOUNTER — Encounter: Payer: Self-pay | Admitting: Gerontology

## 2022-08-16 ENCOUNTER — Ambulatory Visit: Payer: Self-pay | Admitting: Gerontology

## 2022-08-16 ENCOUNTER — Other Ambulatory Visit: Payer: Self-pay

## 2022-08-16 VITALS — BP 116/74 | HR 85 | Temp 98.5°F | Resp 16 | Ht 70.0 in | Wt 209.8 lb

## 2022-08-16 DIAGNOSIS — R748 Abnormal levels of other serum enzymes: Secondary | ICD-10-CM

## 2022-08-16 DIAGNOSIS — F1010 Alcohol abuse, uncomplicated: Secondary | ICD-10-CM

## 2022-08-16 DIAGNOSIS — R569 Unspecified convulsions: Secondary | ICD-10-CM

## 2022-08-16 DIAGNOSIS — K219 Gastro-esophageal reflux disease without esophagitis: Secondary | ICD-10-CM

## 2022-08-16 DIAGNOSIS — I639 Cerebral infarction, unspecified: Secondary | ICD-10-CM

## 2022-08-16 DIAGNOSIS — R7989 Other specified abnormal findings of blood chemistry: Secondary | ICD-10-CM

## 2022-08-16 MED ORDER — CLOPIDOGREL BISULFATE 75 MG PO TABS
ORAL_TABLET | Freq: Every day | ORAL | 0 refills | Status: DC
  Filled 2022-08-16: qty 90, 90d supply, fill #0

## 2022-08-16 MED ORDER — PANTOPRAZOLE SODIUM 40 MG PO TBEC
40.0000 mg | DELAYED_RELEASE_TABLET | Freq: Every day | ORAL | 0 refills | Status: DC
  Filled 2022-08-16: qty 30, 30d supply, fill #0

## 2022-08-16 NOTE — Patient Instructions (Signed)
DASH Eating Plan DASH stands for Dietary Approaches to Stop Hypertension. The DASH eating plan is a healthy eating plan that has been shown to: Reduce high blood pressure (hypertension). Reduce your risk for type 2 diabetes, heart disease, and stroke. Help with weight loss. What are tips for following this plan? Reading food labels Check food labels for the amount of salt (sodium) per serving. Choose foods with less than 5 percent of the Daily Value of sodium. Generally, foods with less than 300 milligrams (mg) of sodium per serving fit into this eating plan. To find whole grains, look for the word "whole" as the first word in the ingredient list. Shopping Buy products labeled as "low-sodium" or "no salt added." Buy fresh foods. Avoid canned foods and pre-made or frozen meals. Cooking Avoid adding salt when cooking. Use salt-free seasonings or herbs instead of table salt or sea salt. Check with your health care provider or pharmacist before using salt substitutes. Do not fry foods. Cook foods using healthy methods such as baking, boiling, grilling, roasting, and broiling instead. Cook with heart-healthy oils, such as olive, canola, avocado, soybean, or sunflower oil. Meal planning  Eat a balanced diet that includes: 4 or more servings of fruits and 4 or more servings of vegetables each day. Try to fill one-half of your plate with fruits and vegetables. 6-8 servings of whole grains each day. Less than 6 oz (170 g) of lean meat, poultry, or fish each day. A 3-oz (85-g) serving of meat is about the same size as a deck of cards. One egg equals 1 oz (28 g). 2-3 servings of low-fat dairy each day. One serving is 1 cup (237 mL). 1 serving of nuts, seeds, or beans 5 times each week. 2-3 servings of heart-healthy fats. Healthy fats called omega-3 fatty acids are found in foods such as walnuts, flaxseeds, fortified milks, and eggs. These fats are also found in cold-water fish, such as sardines, salmon,  and mackerel. Limit how much you eat of: Canned or prepackaged foods. Food that is high in trans fat, such as some fried foods. Food that is high in saturated fat, such as fatty meat. Desserts and other sweets, sugary drinks, and other foods with added sugar. Full-fat dairy products. Do not salt foods before eating. Do not eat more than 4 egg yolks a week. Try to eat at least 2 vegetarian meals a week. Eat more home-cooked food and less restaurant, buffet, and fast food. Lifestyle When eating at a restaurant, ask that your food be prepared with less salt or no salt, if possible. If you drink alcohol: Limit how much you use to: 0-1 drink a day for women who are not pregnant. 0-2 drinks a day for men. Be aware of how much alcohol is in your drink. In the [place] one drink equals one 12 oz bottle of beer (355 mL), one 5 oz glass of wine (148 mL), or one 1 oz glass of hard liquor (44 mL). General information Avoid eating more than 2,300 mg of salt a day. If you have hypertension, you may need to reduce your sodium intake to 1,500 mg a day. Work with your health care provider to maintain a healthy body weight or to lose weight. Ask what an ideal weight is for you. Get at least 30 minutes of exercise that causes your heart to beat faster (aerobic exercise) most days of the week. Activities may include walking, swimming, or biking. Work with your health care provider or dietitian to   adjust your eating plan to your individual calorie needs. What foods should I eat? Fruits All fresh, dried, or frozen fruit. Canned fruit in natural juice (without added sugar). Vegetables Fresh or frozen vegetables (raw, steamed, roasted, or grilled). Low-sodium or reduced-sodium tomato and vegetable juice. Low-sodium or reduced-sodium tomato sauce and tomato paste. Low-sodium or reduced-sodium canned vegetables. Grains Whole-grain or whole-wheat bread. Whole-grain or whole-wheat pasta. Brown rice. Oatmeal. Quinoa.  Bulgur. Whole-grain and low-sodium cereals. Pita bread. Low-fat, low-sodium crackers. Whole-wheat flour tortillas. Meats and other proteins Skinless chicken or turkey. Ground chicken or turkey. Pork with fat trimmed off. Fish and seafood. Egg whites. Dried beans, peas, or lentils. Unsalted nuts, nut butters, and seeds. Unsalted canned beans. Lean cuts of beef with fat trimmed off. Low-sodium, lean precooked or cured meat, such as sausages or meat loaves. Dairy Low-fat (1%) or fat-free (skim) milk. Reduced-fat, low-fat, or fat-free cheeses. Nonfat, low-sodium ricotta or cottage cheese. Low-fat or nonfat yogurt. Low-fat, low-sodium cheese. Fats and oils Soft margarine without trans fats. Vegetable oil. Reduced-fat, low-fat, or light mayonnaise and salad dressings (reduced-sodium). Canola, safflower, olive, avocado, soybean, and sunflower oils. Avocado. Seasonings and condiments Herbs. Spices. Seasoning mixes without salt. Other foods Unsalted popcorn and pretzels. Fat-free sweets. The items listed above may not be a complete list of foods and beverages you can eat. Contact a dietitian for more information. What foods should I avoid? Fruits Canned fruit in a light or heavy syrup. Fried fruit. Fruit in cream or butter sauce. Vegetables Creamed or fried vegetables. Vegetables in a cheese sauce. Regular canned vegetables (not low-sodium or reduced-sodium). Regular canned tomato sauce and paste (not low-sodium or reduced-sodium). Regular tomato and vegetable juice (not low-sodium or reduced-sodium). Pickles. Olives. Grains Baked goods made with fat, such as croissants, muffins, or some breads. Dry pasta or rice meal packs. Meats and other proteins Fatty cuts of meat. Ribs. Fried meat. Bacon. Bologna, salami, and other precooked or cured meats, such as sausages or meat loaves. Fat from the back of a pig (fatback). Bratwurst. Salted nuts and seeds. Canned beans with added salt. Canned or smoked fish.  Whole eggs or egg yolks. Chicken or turkey with skin. Dairy Whole or 2% milk, cream, and half-and-half. Whole or full-fat cream cheese. Whole-fat or sweetened yogurt. Full-fat cheese. Nondairy creamers. Whipped toppings. Processed cheese and cheese spreads. Fats and oils Butter. Stick margarine. Lard. Shortening. Ghee. Bacon fat. Tropical oils, such as coconut, palm kernel, or palm oil. Seasonings and condiments Onion salt, garlic salt, seasoned salt, table salt, and sea salt. Worcestershire sauce. Tartar sauce. Barbecue sauce. Teriyaki sauce. Soy sauce, including reduced-sodium. Steak sauce. Canned and packaged gravies. Fish sauce. Oyster sauce. Cocktail sauce. Store-bought horseradish. Ketchup. Mustard. Meat flavorings and tenderizers. Bouillon cubes. Hot sauces. Pre-made or packaged marinades. Pre-made or packaged taco seasonings. Relishes. Regular salad dressings. Other foods Salted popcorn and pretzels. The items listed above may not be a complete list of foods and beverages you should avoid. Contact a dietitian for more information. Where to find more information National Heart, Lung, and Blood Institute: www.nhlbi.nih.gov American Heart Association: www.heart.org Academy of Nutrition and Dietetics: www.eatright.org National Kidney Foundation: www.kidney.org Summary The DASH eating plan is a healthy eating plan that has been shown to reduce high blood pressure (hypertension). It may also reduce your risk for type 2 diabetes, heart disease, and stroke. When on the DASH eating plan, aim to eat more fresh fruits and vegetables, whole grains, lean proteins, low-fat dairy, and heart-healthy fats. With the DASH   eating plan, you should limit salt (sodium) intake to 2,300 mg a day. If you have hypertension, you may need to reduce your sodium intake to 1,500 mg a day. Work with your health care provider or dietitian to adjust your eating plan to your individual calorie needs. This information is not  intended to replace advice given to you by your health care provider. Make sure you discuss any questions you have with your health care provider. Document Revised: 08/28/2019 Document Reviewed: 08/28/2019 Elsevier Patient Education  2023 Else[employer] Inc. Alcohol Misuse and Dependence Information, Adult Alcohol is a widely available drug and people choose to drink alcohol in different amounts. Alcohol misuse and dependence can have a negative effect on your life. Alcohol misuse is when you use alcohol too much or too often. You may have a hard time setting a limit on the amount you drink. Alcohol dependence is when you use alcohol consistently for a period of time, and your body changes as a result. Alcohol dependence can make it hard for you to stop drinking because you may start to feel sick or different when you do not drink alcohol. These symptoms are known as withdrawal. People who drink alcohol very often and in large amounts, may develop what is called an alcohol use disorder. How can alcohol misuse and dependence affect me? Drinking too much can lead to addiction. You may feel like you need alcohol to function normally. You may drink alcohol before work in the morning, during the day, or as soon as you get home from work in the evening. These actions can result in: Poor work performance. Job loss. Financial problems. Car crashes or criminal charges from driving after drinking alcohol. Problems in your relationships with friends and family. Losing the trust and respect of coworkers, friends, and family. Drinking heavily over a long period of time can permanently damage your body and brain, and can cause lifelong health issues, such as: Damage to your liver or pancreas. Heart problems, high blood pressure, or stroke. Certain cancers. Decreased ability to fight infections. Brain or nerve damage. Depression. Early death, also called premature death. If you are careless or you crave alcohol,  it is easy to drink more than your body can handle (overdose). Alcohol overdose is a serious situation that requires hospitalization. It may lead to permanent injuries or death. What can increase my risk? Having a family history of alcohol misuse. Having depression or other mental health conditions. Beginning to drink at an early age. Binge drinking often. Experiencing trauma, stress, and an unstable home life during childhood. Spending time with people who drink often. What actions can I take to prevent alcohol misuse and dependence? Do not drink alcohol if: Your health care provider tells you not to drink. You are pregnant, may be pregnant, or are planning to become pregnant. If you drink alcohol: Limit how much you have to: 0-1 drink a day for women who are not pregnant. 0-2 drinks a day for men. Know how much alcohol is in your drink. In the [place] one drink equals one 12 oz bottle of beer (355 mL), one 5 oz glass of wine (148 mL), or one 1 oz glass of hard liquor (44 mL). If you think you have an alcohol dependency problem, decide to stop drinking. This can be very hard to do if you are used to frequently drinking alcohol. If you begin to have withdrawal symptoms, talk with your health care provider or a person that you trust. These symptoms  may include anxiety, shaky hands, headache, nausea, sweating, or not being able to sleep. Choose to drink nonalcoholic beverages in social gatherings and places where there may be alcohol. Activity Spend more time on activities that you enjoy that do not involve alcohol, like hobbies or exercise. Find healthy ways to cope with stress, such as meditation or spending time with people you care about. General information Talk to your family, coworkers, and friends about supporting you in your efforts to stop drinking. If they drink, ask them not to drink around you. Spend more time with people who do not drink alcohol. If you think that you have an  alcohol dependency problem: Tell friends or family about your concerns. Talk with your health care provider or another health professional about where to get help. Work with a Paramedic and a Network engineer. Consider joining a support group for people who struggle with alcohol misuse and dependence. Where to find support  Your health care provider. SMART Recovery: smartrecovery.org Local treatment centers or chemical dependency counselors. Local AA groups in your community: CustomizedRugs.fi Where to find more information Centers for Disease Control and Prevention: TonerPromos.no [employer] on Alcohol Abuse and Alcoholism: BackupSupply.hu Alcoholics Anonymous (AA): CustomizedRugs.fi Contact a health care provider if: You drank more or for longer than you intended on more than one occasion. You often drink to the point of vomiting or passing out. You have problems in your life due to drinking, but you continue to drink. You keep drinking even though you feel anxious, depressed, or have experienced memory loss. You have stopped doing the things you used to enjoy in order to drink. You have to drink more than you used to in order to get the effect you want. You experience anxiety, sweating, nausea, shakiness, and trouble sleeping when you try to stop drinking. Get help right away if: You have serious withdrawal symptoms, including: Confusion. Racing heart. High blood pressure. Fever. These symptoms may be an emergency. Get help right away. Call 911. Do not wait to see if the symptoms will go away. Do not drive yourself to the hospital. Also, get help right away if: You have thoughts about hurting yourself or others. Take one of these steps if you feel like you may hurt yourself or others, or have thoughts about taking your own life: Call 911. Call the National Suicide Prevention Lifeline at 951-726-0640 or 988. This is open 24 hours a day. Text the Crisis Text Line at  651-554-6737. Summary Alcohol misuse and dependence can have a negative effect on your life. Drinking too much or too often can lead to addiction. If you drink alcohol, limit how much you use. If you are having trouble keeping your drinking under control, find ways to change your behavior. Hobbies, calming activities, exercise, or support groups can help. If you feel you need help with changing your drinking habits, talk with your health care provider, a good friend, or a therapist, or go to a support group. This information is not intended to replace advice given to you by your health care provider. Make sure you discuss any questions you have with your health care provider. Document Revised: 11/29/2021 Document Reviewed: 11/29/2021 Elsevier Patient Education  2023 Elsevier Inc. Smoking Tobacco Information, Adult Smoking tobacco can be harmful to your health. Tobacco contains a toxic colorless chemical called nicotine. Nicotine causes changes in your brain that make you want more and more. This is called addiction. This can make it hard to stop smoking once  you start. Tobacco also has other toxic chemicals that can hurt your body and raise your risk of many cancers. Menthol or "lite" tobacco or cigarette brands are not safer than regular brands. How can smoking tobacco affect me? Smoking tobacco puts you at risk for: Cancer. Smoking is most commonly associated with lung cancer, but can also lead to cancer in other parts of the body. Chronic obstructive pulmonary disease (COPD). This is a long-term lung condition that makes it hard to breathe. It also gets worse over time. High blood pressure (hypertension), heart disease, stroke, heart attack, and lung infections, such as pneumonia. Cataracts. This is when the lenses in the eyes become clouded. Digestive problems. This may include peptic ulcers, heartburn, and gastroesophageal reflux disease (GERD). Oral health problems, such as gum disease, mouth  sores, and tooth loss. Loss of taste and smell. Smoking also affects how you look and smell. Smoking may cause: Wrinkles. Yellow or stained teeth, fingers, and fingernails. Bad breath. Bad-smelling clothes and hair. Smoking tobacco can also affect your social life, because: It may be challenging to find places to smoke when away from home. Many workplaces, [employer], hotels, and public places are tobacco-free. Smoking is expensive. This is due to the cost of tobacco and the long-term costs of treating health problems from smoking. Secondhand smoke may affect those around you. Secondhand smoke can cause lung cancer, breathing problems, and heart disease. Children of smokers have a higher risk for: Sudden infant death syndrome (SIDS). Ear infections. Lung infections. What actions can I take to prevent health problems? Quit smoking  Do not start smoking. Quit if you already smoke. Do not replace cigarette smoking with vaping devices, such as e-cigarettes. Make a plan to quit smoking and commit to it. Look for programs to help you, and ask your health care provider for recommendations and ideas. Set a date and write down all the reasons you want to quit. Let your friends and family know you are quitting so they can help and support you. Consider finding friends who also want to quit. It can be easier to quit with someone else, so that you can support each other. Talk with your health care provider about using nicotine replacement medicines to help you quit. These include gum, lozenges, patches, sprays, or pills. If you try to quit but return to smoking, stay positive. It is common to slip up when you first quit, so take it one day at a time. Be prepared for cravings. When you feel the urge to smoke, chew gum or suck on hard candy. Lifestyle Stay busy. Take care of your body. Get plenty of exercise, eat a healthy diet, and drink plenty of water. Find ways to manage your stress, such as  meditation, yoga, exercise, or time spent with friends and family. Ask your health care provider about having regular tests (screenings) to check for cancer. This may include blood tests, imaging tests, and other tests. Where to find support To get support to quit smoking, consider: Asking your health care provider for more information and resources. Joining a support group for people who want to quit smoking in your local community. There are many effective programs that may help you to quit. Calling the smokefree.gov counselor helpline at 1-800-QUIT-NOW 7011471106). Where to find more information You may find more information about quitting smoking from: Centers for Disease Control and Prevention: http://www.osborne.com/ BankRights.uy: smokefree.gov American Lung Association: freedomfromsmoking.org Contact a health care provider if: You have problems breathing. Your lips, nose, or  fingers turn blue. You have chest pain. You are coughing up blood. You feel like you will faint. You have other health changes that cause you to worry. Summary Smoking tobacco can negatively affect your health, the health of those around you, your finances, and your social life. Do not start smoking. Quit if you already smoke. If you need help quitting, ask your health care provider. Consider joining a support group for people in your local community who want to quit smoking. There are many effective programs that may help you to quit. This information is not intended to replace advice given to you by your health care provider. Make sure you discuss any questions you have with your health care provider. Document Revised: 09/19/2021 Document Reviewed: 09/19/2021 Elsevier Patient Education  2023 [employer].

## 2022-08-16 NOTE — Progress Notes (Signed)
Established Patient Office Visit  Subjective   Patient ID: Gary [name], male    DOB: [birth year]  Age: 51 y.o. MRN: [id number]  Chief Complaint  Patient presents with   Hospitalization Follow-up    Patient admitted at [facility] on 07/31/22 for TIA.   Hypertension    HPI  Gary [name] is a a 51 y/o with a history of hypertension, hyperlipidemia, seizure disorder, CVA, alcohol use, tobacco use, and GERD who presents for a hospital follow-up. He presented to the emergency department on 07/31/22 with dizziness, difficulty speaking and finding words, and bilateral leg numbness. His head MRI was negative and CTA head/neck showed acute worsening of intracranial atherosclerosis. Presentation consistent with TIA. It was recommended he be started on a PCSK9 inhibitor. While in the hospital, his Cr was 1.62 and his BUN 21. He was symptom-free upon discharge. He reports that he had a seizure two days after leaving the hospital, in which he passed out. He did not seek medical attention. He denies any further seizures or stroke-like symptoms. He continues to drink 2.5 mixed drinks a day and smokes 4 cigarettes a day. He is interested in cutting back on both alcohol and cigarettes. His blood pressure today in clinic is well-controlled at 116/74 mmHg. He offers no further complaints.   Review of Systems  Constitutional: Negative.   HENT: Negative.    Eyes: Negative.   Respiratory: Negative.    Cardiovascular: Negative.   Gastrointestinal: Negative.   Genitourinary: Negative.   Musculoskeletal: Negative.   Neurological: Negative.  Negative for dizziness, tingling, tremors, sensory change, speech change, seizures and headaches.  Endo/Heme/Allergies: Negative.   Psychiatric/Behavioral: Negative.        Objective:     BP 116/74 (BP Location: Right Arm, Patient Position: Sitting, Cuff Size: Large)   Pulse 85   Temp 98.5 F (36.9 C) (Oral)   Resp 16   Ht 5\' 10"  (1.778 m)   Wt 209 lb 12.8 oz (95.2  kg)   SpO2 95%   BMI 30.10 kg/m  BP Readings from Last 3 Encounters:  08/16/22 116/74  07/31/22 106/80  07/19/22 (!) 155/103   Wt Readings from Last 3 Encounters:  08/16/22 209 lb 12.8 oz (95.2 kg)  07/31/22 209 lb 14.1 oz (95.2 kg)  07/19/22 213 lb 9.6 oz (96.9 kg)      Physical Exam Constitutional:      Appearance: Normal appearance. He is normal weight.  Cardiovascular:     Rate and Rhythm: Normal rate and regular rhythm.     Pulses: Normal pulses.     Heart sounds: Normal heart sounds.  Pulmonary:     Effort: Pulmonary effort is normal.     Breath sounds: Normal breath sounds.  Skin:    General: Skin is warm and dry.  Neurological:     General: No focal deficit present.     Mental Status: He is alert and oriented to person, place, and time. Mental status is at baseline.  Psychiatric:        Mood and Affect: Mood normal.        Behavior: Behavior normal.        Thought Content: Thought content normal.        Judgment: Judgment normal.      No results found for any visits on 08/16/22.  Last CBC Lab Results  Component Value Date   WBC 6.0 07/31/2022   HGB 14.9 07/31/2022   HCT 43.1 07/31/2022   MCV 100.2 (H) 07/31/2022  MCH 34.7 (H) 07/31/2022   RDW 12.2 07/31/2022   PLT 238 07/31/2022   Last metabolic panel Lab Results  Component Value Date   GLUCOSE 160 (H) 07/31/2022   NA 133 (L) 07/31/2022   K 4.5 07/31/2022   CL 99 07/31/2022   CO2 22 07/31/2022   BUN 21 (H) 07/31/2022   CREATININE 1.62 (H) 07/31/2022   GFRNONAA 51 (L) 07/31/2022   CALCIUM 9.6 07/31/2022   PROT 8.2 (H) 07/31/2022   ALBUMIN 3.8 07/31/2022   LABGLOB 3.4 02/22/2022   AGRATIO 1.3 02/22/2022   BILITOT 0.9 07/31/2022   ALKPHOS 90 07/31/2022   AST 148 (H) 07/31/2022   ALT 137 (H) 07/31/2022   ANIONGAP 12 07/31/2022   Last lipids Lab Results  Component Value Date   CHOL 291 (H) 07/31/2022   HDL 34 (L) 07/31/2022   LDLCALC 235 (H) 07/31/2022   TRIG 112 07/31/2022    CHOLHDL 8.6 07/31/2022   Last hemoglobin A1c Lab Results  Component Value Date   HGBA1C 5.5 07/31/2022   Last thyroid functions Lab Results  Component Value Date   TSH 1.400 02/02/2021    Last vitamin B12 and Folate Lab Results  Component Value Date   VITAMINB12 447 12/29/2020   FOLATE 5.2 12/03/2018      The ASCVD Risk score (Arnett DK, et al., 2019) failed to calculate for the following reasons:   The patient has a prior MI or stroke diagnosis    Assessment & Plan:   1. Cerebrovascular accident (CVA), unspecified mechanism (HCC) - He was encouraged to follow-up with his neurologist. He was encouraged on smoking cessation and given [place] Quitline information. Report to the emergency department if he develops any stroke-like symptoms (dizziness, weakness, speech changes, sensory changes, etc.). DASH diet and exercise as tolerated.   2. Alcohol abuse - He was encouraged to cut down on drinking alcohol. Monitor for signs of alcohol withdrawal. He is being referred to GI for elevated liver enzymes.   3. Elevated liver enzymes - He was referred to cardiology for possible initiation of PCSK9 inhibitor. Continue low-cholesterol diet and exercise as tolerated.   4. Elevated serum creatinine - Will recheck CMET today. He should continue to stay hydrated.   5. Seizures (HCC) - Follow-up with neurologist. Report to the emergency department for any new seizure-like activity.    Follow-up in one month, around 09/15/22   Shella Maxim, FNP Student

## 2022-08-17 LAB — COMPREHENSIVE METABOLIC PANEL
ALT: 74 IU/L — ABNORMAL HIGH (ref 0–44)
AST: 72 IU/L — ABNORMAL HIGH (ref 0–40)
Albumin/Globulin Ratio: 1.3 (ref 1.2–2.2)
Albumin: 4.5 g/dL (ref 4.1–5.1)
Alkaline Phosphatase: 111 IU/L (ref 44–121)
BUN/Creatinine Ratio: 15 (ref 9–20)
BUN: 25 mg/dL — ABNORMAL HIGH (ref 6–24)
Bilirubin Total: 0.6 mg/dL (ref 0.0–1.2)
CO2: 20 mmol/L (ref 20–29)
Calcium: 10 mg/dL (ref 8.7–10.2)
Chloride: 99 mmol/L (ref 96–106)
Creatinine, Ser: 1.66 mg/dL — ABNORMAL HIGH (ref 0.76–1.27)
Globulin, Total: 3.4 g/dL (ref 1.5–4.5)
Glucose: 123 mg/dL — ABNORMAL HIGH (ref 70–99)
Potassium: 4.9 mmol/L (ref 3.5–5.2)
Sodium: 136 mmol/L (ref 134–144)
Total Protein: 7.9 g/dL (ref 6.0–8.5)
eGFR: 50 mL/min/{1.73_m2} — ABNORMAL LOW (ref >59)

## 2022-08-17 LAB — MAGNESIUM: Magnesium: 2.2 mg/dL (ref 1.6–2.3)

## 2022-08-17 LAB — PHOSPHORUS: Phosphorus: 3 mg/dL (ref 2.8–4.1)

## 2022-09-13 ENCOUNTER — Encounter: Payer: Self-pay | Admitting: Gerontology

## 2022-09-13 ENCOUNTER — Ambulatory Visit: Payer: Self-pay | Admitting: Gerontology

## 2022-09-13 ENCOUNTER — Other Ambulatory Visit: Payer: Self-pay

## 2022-09-13 VITALS — BP 107/72 | HR 86 | Temp 97.9°F | Resp 16 | Wt 208.5 lb

## 2022-09-13 DIAGNOSIS — I639 Cerebral infarction, unspecified: Secondary | ICD-10-CM

## 2022-09-13 DIAGNOSIS — R748 Abnormal levels of other serum enzymes: Secondary | ICD-10-CM

## 2022-09-13 DIAGNOSIS — R7989 Other specified abnormal findings of blood chemistry: Secondary | ICD-10-CM

## 2022-09-13 DIAGNOSIS — E782 Mixed hyperlipidemia: Secondary | ICD-10-CM

## 2022-09-13 MED ORDER — CLOPIDOGREL BISULFATE 75 MG PO TABS
ORAL_TABLET | Freq: Every day | ORAL | 0 refills | Status: DC
  Filled 2022-09-13: qty 90, fill #0

## 2022-09-13 NOTE — Progress Notes (Signed)
Established Patient Office Visit  Subjective   Patient ID: Gary [name], male    DOB: [birth year]  Age: 51 y.o. MRN: [id number]  No chief complaint on file.   HPI  Gary [name] is a a 51 y/o with a history of hypertension, hyperlipidemia, seizure disorder, CVA, alcohol use, tobacco use, and GERD who presents for follow up visit and lab review. His Serum creatinine done 4 weeks ago increased from 1.62 mg/dl to 1.66 mg/dl, and eGFR was 50. His Liver enzymes, AST decreased from 148 IU/L to 72 IU/L and ALT from 137 IU/L to 74 IU/L.  He takes 25 mg of Hctz daily. He has not followed up with Neurology because he has no body to drive him to [facility] due to his history of seizuires,  and has not seen Gastroenterology for elevated liver enzymes, because their next appointment is in April. He continues to drink 2-3 mixed drinks daily and states that he's cutting back. He denies chest pain, palpitation, right upper  abdominal quadrant pain, jaundice. Overall, he states that he's doing well and offers no further complaint.   Patient Active Problem List   Diagnosis Date Noted   TIA (transient ischemic attack) 07/31/2022   Hyperglycemia 07/31/2022   Erectile dysfunction 06/07/2022   Cough 11/23/2021   Bilateral arm pain 10/19/2021   Headache 09/10/2021   Insomnia 09/07/2021   Elevated serum creatinine 04/20/2021   Elevated liver enzymes 02/16/2021   Peripheral neuropathy 02/16/2021   Elevated troponin 12/29/2020   Stroke ([place]) 12/29/2020   Acute renal failure superimposed on stage 2 chronic kidney disease ([place]) 12/29/2020   Alcohol abuse    Tobacco abuse    Hypertension 11/07/2017   Hyperlipidemia 11/07/2017   Seizures ([place]) 11/07/2017   History of stroke 11/07/2017   Submandibular abscess 11/07/2017   Past Medical History:  Diagnosis Date   Alcohol use    Carotid artery occlusion    GERD (gastroesophageal reflux disease)    Hypercholesteremia    Hypertension    Seizures ([place])     Stroke ([place])    TIA 12/29/20, re-admitted 01/14/21 for sequelae   Tobacco abuse    Past Surgical History:  Procedure Laterality Date   NO PAST SURGERIES     Social History   Tobacco Use   Smoking status: Every Day    Packs/day: 0.25    Years: 32.00    Total pack years: 8.00    Types: Cigarettes   Smokeless tobacco: Never   Tobacco comments:    Pt reports he has thoughts of quitting, has cut back to 4 cigarettes per day  Vaping Use   Vaping Use: Never used  Substance Use Topics   Alcohol use: Yes    Alcohol/week: 21.0 standard drinks of alcohol    Types: 21 Standard drinks or equivalent per week    Comment: 3 mixed drinks daily   Drug use: No   No Known Allergies    Review of Systems  Constitutional: Negative.   Respiratory: Negative.    Cardiovascular: Negative.   Gastrointestinal: Negative.   Skin: Negative.   Neurological: Negative.   Endo/Heme/Allergies: Negative.   Psychiatric/Behavioral: Negative.        Objective:     BP 107/72 (BP Location: Right Arm, Patient Position: Sitting, Cuff Size: Large)   Pulse 86   Temp 97.9 F (36.6 C)   Resp 16   Wt 208 lb 8 oz (94.6 kg)   BMI 29.92 kg/m  BP Readings from Last 3 Encounters:  09/13/22 107/72  08/16/22 116/74  07/31/22 106/80   Wt Readings from Last 3 Encounters:  09/13/22 208 lb 8 oz (94.6 kg)  08/16/22 209 lb 12.8 oz (95.2 kg)  07/31/22 209 lb 14.1 oz (95.2 kg)      Physical Exam HENT:     Head: Normocephalic and atraumatic.  Eyes:     Extraocular Movements: Extraocular movements intact.     Conjunctiva/sclera: Conjunctivae normal.     Pupils: Pupils are equal, round, and reactive to light.  Cardiovascular:     Rate and Rhythm: Normal rate and regular rhythm.     Pulses: Normal pulses.     Heart sounds: Normal heart sounds.  Pulmonary:     Effort: Pulmonary effort is normal.     Breath sounds: Normal breath sounds.  Abdominal:     General: Bowel sounds are normal.     Palpations:  Abdomen is soft.  Skin:    General: Skin is warm.  Neurological:     General: No focal deficit present.     Mental Status: He is alert and oriented to person, place, and time. Mental status is at baseline.  Psychiatric:        Mood and Affect: Mood normal.        Behavior: Behavior normal.        Thought Content: Thought content normal.        Judgment: Judgment normal.      No results found for any visits on 09/13/22.  Last CBC Lab Results  Component Value Date   WBC 6.0 07/31/2022   HGB 14.9 07/31/2022   HCT 43.1 07/31/2022   MCV 100.2 (H) 07/31/2022   MCH 34.7 (H) 07/31/2022   RDW 12.2 07/31/2022   PLT 238 93/23/5573   Last metabolic panel Lab Results  Component Value Date   GLUCOSE 123 (H) 08/16/2022   NA 136 08/16/2022   K 4.9 08/16/2022   CL 99 08/16/2022   CO2 20 08/16/2022   BUN 25 (H) 08/16/2022   CREATININE 1.66 (H) 08/16/2022   EGFR 50 (L) 08/16/2022   CALCIUM 10.0 08/16/2022   PHOS 3.0 08/16/2022   PROT 7.9 08/16/2022   ALBUMIN 4.5 08/16/2022   LABGLOB 3.4 08/16/2022   AGRATIO 1.3 08/16/2022   BILITOT 0.6 08/16/2022   ALKPHOS 111 08/16/2022   AST 72 (H) 08/16/2022   ALT 74 (H) 08/16/2022   ANIONGAP 12 07/31/2022   Last lipids Lab Results  Component Value Date   CHOL 291 (H) 07/31/2022   HDL 34 (L) 07/31/2022   LDLCALC 235 (H) 07/31/2022   TRIG 112 07/31/2022   CHOLHDL 8.6 07/31/2022   Last hemoglobin A1c Lab Results  Component Value Date   HGBA1C 5.5 07/31/2022   Last thyroid functions Lab Results  Component Value Date   TSH 1.400 02/02/2021   Last vitamin D No results found for: "25OHVITD2", "25OHVITD3", "VD25OH"    The ASCVD Risk score (Arnett DK, et al., 2019) failed to calculate for the following reasons:   The patient has a prior MI or stroke diagnosis    Assessment & Plan:   1. Cerebrovascular accident (CVA), unspecified mechanism ([place]) - He will continue on current medication and go to the ED for hematuria,  hematochezia and active bleeding. - clopidogrel (PLAVIX) 75 MG tablet; TAKE ONE TABLET BY MOUTH ONCE EVERY DAY.  Dispense: 90 tablet; Refill: 0  2. Elevated serum creatinine - His Hctz was discontinued, his blood pressure is low and under control. He  was advised to increase water intake, check  blood pressure daily, record and bring log to follow up appointment.  - Comp Met (CMET); Future  3. Elevated liver enzymes He was referred to cardiology for possible initiation of PCSK9 inhibitor. Continue low-cholesterol diet and exercise as tolerated.   4. Mixed hyperlipidemia - Will recheck Lipid panel in 1 month. He was advised to schedule appointment with Cardiology.He was referred to cardiology for possible initiation of PCSK9 inhibitor. Continue low-cholesterol diet and exercise as tolerated.  - Lipid panel; Future   Return in about 5 weeks (around 10/18/2022), or if symptoms worsen or fail to improve.    Cornie Mccomber Jerold Coombe, NP

## 2022-09-13 NOTE — Patient Instructions (Signed)
Heart-Healthy Eating Plan Many factors influence your heart health, including eating and exercise habits. Heart health is also called coronary health. Coronary risk increases with abnormal blood fat (lipid) levels. A heart-healthy eating plan includes limiting unhealthy fats, increasing healthy fats, limiting salt (sodium) intake, and making other diet and lifestyle changes. What is my plan? Your health care provider may recommend that: You limit your fat intake to _________% or less of your total calories each day. You limit your saturated fat intake to _________% or less of your total calories each day. You limit the amount of cholesterol in your diet to less than _________ mg per day. You limit the amount of sodium in your diet to less than _________ mg per day. What are tips for following this plan? Cooking Cook foods using methods other than frying. Baking, boiling, grilling, and broiling are all good options. Other ways to reduce fat include: Removing the skin from poultry. Removing all visible fats from meats. Steaming vegetables in water or broth. Meal planning  At meals, imagine dividing your plate into fourths: Fill one-half of your plate with vegetables and green salads. Fill one-fourth of your plate with whole grains. Fill one-fourth of your plate with lean protein foods. Eat 2-4 cups of vegetables per day. One cup of vegetables equals 1 cup (91 g) broccoli or cauliflower florets, 2 medium carrots, 1 large bell pepper, 1 large sweet potato, 1 large tomato, 1 medium white potato, 2 cups (150 g) raw leafy greens. Eat 1-2 cups of fruit per day. One cup of fruit equals 1 small apple, 1 large banana, 1 cup (237 g) mixed fruit, 1 large orange,  cup (82 g) dried fruit, 1 cup (240 mL) 100% fruit juice. Eat more foods that contain soluble fiber. Examples include apples, broccoli, carrots, beans, peas, and barley. Aim to get 25-30 g of fiber per day. Increase your consumption of legumes,  nuts, and seeds to 4-5 servings per week. One serving of dried beans or legumes equals  cup (90 g) cooked, 1 serving of nuts is  oz (12 almonds, 24 pistachios, or 7 walnut halves), and 1 serving of seeds equals  oz (8 g). Fats Choose healthy fats more often. Choose monounsaturated and polyunsaturated fats, such as olive and canola oils, avocado oil, flaxseeds, walnuts, almonds, and seeds. Eat more omega-3 fats. Choose salmon, mackerel, sardines, tuna, flaxseed oil, and ground flaxseeds. Aim to eat fish at least 2 times each week. Check food labels carefully to identify foods with trans fats or high amounts of saturated fat. Limit saturated fats. These are found in animal products, such as meats, butter, and cream. Plant sources of saturated fats include palm oil, palm kernel oil, and coconut oil. Avoid foods with partially hydrogenated oils in them. These contain trans fats. Examples are stick margarine, some tub margarines, cookies, crackers, and other baked goods. Avoid fried foods. General information Eat more home-cooked food and less restaurant, buffet, and fast food. Limit or avoid alcohol. Limit foods that are high in added sugar and simple starches such as foods made using white refined flour (white breads, pastries, sweets). Lose weight if you are overweight. Losing just 5-10% of your body weight can help your overall health and prevent diseases such as diabetes and heart disease. Monitor your sodium intake, especially if you have high blood pressure. Talk with your health care provider about your sodium intake. Try to incorporate more vegetarian meals weekly. What foods should I eat? Fruits All fresh, canned (in natural   juice), or frozen fruits. Vegetables Fresh or frozen vegetables (raw, steamed, roasted, or grilled). Green salads. Grains Most grains. Choose whole wheat and whole grains most of the time. Rice and pasta, including brown rice and pastas made with whole wheat. Meats  and other proteins Lean, well-trimmed beef, veal, pork, and lamb. Chicken and turkey without skin. All fish and shellfish. Wild duck, rabbit, pheasant, and venison. Egg whites or low-cholesterol egg substitutes. Dried beans, peas, lentils, and tofu. Seeds and most nuts. Dairy Low-fat or nonfat cheeses, including ricotta and mozzarella. Skim or 1% milk (liquid, powdered, or evaporated). Buttermilk made with low-fat milk. Nonfat or low-fat yogurt. Fats and oils Non-hydrogenated (trans-free) margarines. Vegetable oils, including soybean, sesame, sunflower, olive, avocado, peanut, safflower, corn, canola, and cottonseed. Salad dressings or mayonnaise made with a vegetable oil. Beverages Water (mineral or sparkling). Coffee and tea. Unsweetened ice tea. Diet beverages. Sweets and desserts Sherbet, gelatin, and fruit ice. Small amounts of dark chocolate. Limit all sweets and desserts. Seasonings and condiments All seasonings and condiments. The items listed above may not be a complete list of foods and beverages you can eat. Contact a dietitian for more options. What foods should I avoid? Fruits Canned fruit in heavy syrup. Fruit in cream or butter sauce. Fried fruit. Limit coconut. Vegetables Vegetables cooked in cheese, cream, or butter sauce. Fried vegetables. Grains Breads made with saturated or trans fats, oils, or whole milk. Croissants. Sweet rolls. Donuts. High-fat crackers, such as cheese crackers and chips. Meats and other proteins Fatty meats, such as hot dogs, ribs, sausage, bacon, rib-eye roast or steak. High-fat deli meats, such as salami and bologna. Caviar. Domestic duck and goose. Organ meats, such as liver. Dairy Cream, sour cream, cream cheese, and creamed cottage cheese. Whole-milk cheeses. Whole or 2% milk (liquid, evaporated, or condensed). Whole buttermilk. Cream sauce or high-fat cheese sauce. Whole-milk yogurt. Fats and oils Meat fat, or shortening. Cocoa butter,  hydrogenated oils, palm oil, coconut oil, palm kernel oil. Solid fats and shortenings, including bacon fat, salt pork, lard, and butter. Nondairy cream substitutes. Salad dressings with cheese or sour cream. Beverages Regular sodas and any drinks with added sugar. Sweets and desserts Frosting. Pudding. Cookies. Cakes. Pies. Milk chocolate or white chocolate. Buttered syrups. Full-fat ice cream or ice cream drinks. The items listed above may not be a complete list of foods and beverages to avoid. Contact a dietitian for more information. Summary Heart-healthy meal planning includes limiting unhealthy fats, increasing healthy fats, limiting salt (sodium) intake and making other diet and lifestyle changes. Lose weight if you are overweight. Losing just 5-10% of your body weight can help your overall health and prevent diseases such as diabetes and heart disease. Focus on eating a balance of foods, including fruits and vegetables, low-fat or nonfat dairy, lean protein, nuts and legumes, whole grains, and heart-healthy oils and fats. This information is not intended to replace advice given to you by your health care provider. Make sure you discuss any questions you have with your health care provider. Document Revised: 10/30/2021 Document Reviewed: 10/30/2021 Else[employer] Patient Education  2023 [employer] DASH Eating Plan DASH stands for Dietary Approaches to Stop Hypertension. The DASH eating plan is a healthy eating plan that has been shown to: Reduce high blood pressure (hypertension). Reduce your risk for type 2 diabetes, heart disease, and stroke. Help with weight loss. What are tips for following this plan? Reading food labels Check food labels for the amount of salt (sodium) per serving. Choose   foods with less than 5 percent of the Daily Value of sodium. Generally, foods with less than 300 milligrams (mg) of sodium per serving fit into this eating plan. To find whole grains, look for the word  "whole" as the first word in the ingredient list. Shopping Buy products labeled as "low-sodium" or "no salt added." Buy fresh foods. Avoid canned foods and pre-made or frozen meals. Cooking Avoid adding salt when cooking. Use salt-free seasonings or herbs instead of table salt or sea salt. Check with your health care provider or pharmacist before using salt substitutes. Do not fry foods. Cook foods using healthy methods such as baking, boiling, grilling, roasting, and broiling instead. Cook with heart-healthy oils, such as olive, canola, avocado, soybean, or sunflower oil. Meal planning  Eat a balanced diet that includes: 4 or more servings of fruits and 4 or more servings of vegetables each day. Try to fill one-half of your plate with fruits and vegetables. 6-8 servings of whole grains each day. Less than 6 oz (170 g) of lean meat, poultry, or fish each day. A 3-oz (85-g) serving of meat is about the same size as a deck of cards. One egg equals 1 oz (28 g). 2-3 servings of low-fat dairy each day. One serving is 1 cup (237 mL). 1 serving of nuts, seeds, or beans 5 times each week. 2-3 servings of heart-healthy fats. Healthy fats called omega-3 fatty acids are found in foods such as walnuts, flaxseeds, fortified milks, and eggs. These fats are also found in cold-water fish, such as sardines, salmon, and mackerel. Limit how much you eat of: Canned or prepackaged foods. Food that is high in trans fat, such as some fried foods. Food that is high in saturated fat, such as fatty meat. Desserts and other sweets, sugary drinks, and other foods with added sugar. Full-fat dairy products. Do not salt foods before eating. Do not eat more than 4 egg yolks a week. Try to eat at least 2 vegetarian meals a week. Eat more home-cooked food and less restaurant, buffet, and fast food. Lifestyle When eating at a restaurant, ask that your food be prepared with less salt or no salt, if possible. If you drink  alcohol: Limit how much you use to: 0-1 drink a day for women who are not pregnant. 0-2 drinks a day for men. Be aware of how much alcohol is in your drink. In the [place] one drink equals one 12 oz bottle of beer (355 mL), one 5 oz glass of wine (148 mL), or one 1 oz glass of hard liquor (44 mL). General information Avoid eating more than 2,300 mg of salt a day. If you have hypertension, you may need to reduce your sodium intake to 1,500 mg a day. Work with your health care provider to maintain a healthy body weight or to lose weight. Ask what an ideal weight is for you. Get at least 30 minutes of exercise that causes your heart to beat faster (aerobic exercise) most days of the week. Activities may include walking, swimming, or biking. Work with your health care provider or dietitian to adjust your eating plan to your individual calorie needs. What foods should I eat? Fruits All fresh, dried, or frozen fruit. Canned fruit in natural juice (without added sugar). Vegetables Fresh or frozen vegetables (raw, steamed, roasted, or grilled). Low-sodium or reduced-sodium tomato and vegetable juice. Low-sodium or reduced-sodium tomato sauce and tomato paste. Low-sodium or reduced-sodium canned vegetables. Grains Whole-grain or whole-wheat bread. Whole-grain or   whole-wheat pasta. Brown rice. Oatmeal. Quinoa. Bulgur. Whole-grain and low-sodium cereals. Pita bread. Low-fat, low-sodium crackers. Whole-wheat flour tortillas. Meats and other proteins Skinless chicken or turkey. Ground chicken or turkey. Pork with fat trimmed off. Fish and seafood. Egg whites. Dried beans, peas, or lentils. Unsalted nuts, nut butters, and seeds. Unsalted canned beans. Lean cuts of beef with fat trimmed off. Low-sodium, lean precooked or cured meat, such as sausages or meat loaves. Dairy Low-fat (1%) or fat-free (skim) milk. Reduced-fat, low-fat, or fat-free cheeses. Nonfat, low-sodium ricotta or cottage cheese. Low-fat or  nonfat yogurt. Low-fat, low-sodium cheese. Fats and oils Soft margarine without trans fats. Vegetable oil. Reduced-fat, low-fat, or light mayonnaise and salad dressings (reduced-sodium). Canola, safflower, olive, avocado, soybean, and sunflower oils. Avocado. Seasonings and condiments Herbs. Spices. Seasoning mixes without salt. Other foods Unsalted popcorn and pretzels. Fat-free sweets. The items listed above may not be a complete list of foods and beverages you can eat. Contact a dietitian for more information. What foods should I avoid? Fruits Canned fruit in a light or heavy syrup. Fried fruit. Fruit in cream or butter sauce. Vegetables Creamed or fried vegetables. Vegetables in a cheese sauce. Regular canned vegetables (not low-sodium or reduced-sodium). Regular canned tomato sauce and paste (not low-sodium or reduced-sodium). Regular tomato and vegetable juice (not low-sodium or reduced-sodium). Pickles. Olives. Grains Baked goods made with fat, such as croissants, muffins, or some breads. Dry pasta or rice meal packs. Meats and other proteins Fatty cuts of meat. Ribs. Fried meat. Bacon. Bologna, salami, and other precooked or cured meats, such as sausages or meat loaves. Fat from the back of a pig (fatback). Bratwurst. Salted nuts and seeds. Canned beans with added salt. Canned or smoked fish. Whole eggs or egg yolks. Chicken or turkey with skin. Dairy Whole or 2% milk, cream, and half-and-half. Whole or full-fat cream cheese. Whole-fat or sweetened yogurt. Full-fat cheese. Nondairy creamers. Whipped toppings. Processed cheese and cheese spreads. Fats and oils Butter. Stick margarine. Lard. Shortening. Ghee. Bacon fat. Tropical oils, such as coconut, palm kernel, or palm oil. Seasonings and condiments Onion salt, garlic salt, seasoned salt, table salt, and sea salt. Worcestershire sauce. Tartar sauce. Barbecue sauce. Teriyaki sauce. Soy sauce, including reduced-sodium. Steak sauce.  Canned and packaged gravies. Fish sauce. Oyster sauce. Cocktail sauce. Store-bought horseradish. Ketchup. Mustard. Meat flavorings and tenderizers. Bouillon cubes. Hot sauces. Pre-made or packaged marinades. Pre-made or packaged taco seasonings. Relishes. Regular salad dressings. Other foods Salted popcorn and pretzels. The items listed above may not be a complete list of foods and beverages you should avoid. Contact a dietitian for more information. Where to find more information National Heart, Lung, and Blood Institute: www.nhlbi.nih.gov American Heart Association: www.heart.org Academy of Nutrition and Dietetics: www.eatright.org National Kidney Foundation: www.kidney.org Summary The DASH eating plan is a healthy eating plan that has been shown to reduce high blood pressure (hypertension). It may also reduce your risk for type 2 diabetes, heart disease, and stroke. When on the DASH eating plan, aim to eat more fresh fruits and vegetables, whole grains, lean proteins, low-fat dairy, and heart-healthy fats. With the DASH eating plan, you should limit salt (sodium) intake to 2,300 mg a day. If you have hypertension, you may need to reduce your sodium intake to 1,500 mg a day. Work with your health care provider or dietitian to adjust your eating plan to your individual calorie needs. This information is not intended to replace advice given to you by your health care provider. Make sure   you discuss any questions you have with your health care provider. Document Revised: 08/28/2019 Document Reviewed: 08/28/2019 Elsevier Patient Education  2023 [employer]  

## 2022-09-21 ENCOUNTER — Other Ambulatory Visit: Payer: Self-pay

## 2022-09-21 ENCOUNTER — Other Ambulatory Visit: Payer: Self-pay | Admitting: Gerontology

## 2022-09-21 DIAGNOSIS — K219 Gastro-esophageal reflux disease without esophagitis: Secondary | ICD-10-CM

## 2022-09-23 ENCOUNTER — Other Ambulatory Visit: Payer: Self-pay

## 2022-09-25 ENCOUNTER — Other Ambulatory Visit: Payer: Self-pay

## 2022-09-25 MED FILL — Pantoprazole Sodium EC Tab 40 MG (Base Equiv): ORAL | 30 days supply | Qty: 30 | Fill #0 | Status: AC

## 2022-09-27 ENCOUNTER — Other Ambulatory Visit: Payer: Self-pay

## 2022-10-11 ENCOUNTER — Other Ambulatory Visit: Payer: Self-pay

## 2022-10-11 ENCOUNTER — Ambulatory Visit: Payer: Self-pay | Admitting: Gastroenterology

## 2022-10-12 ENCOUNTER — Other Ambulatory Visit: Payer: Self-pay | Admitting: Gerontology

## 2022-10-12 ENCOUNTER — Other Ambulatory Visit: Payer: Self-pay

## 2022-10-13 MED FILL — Hydrochlorothiazide Cap 12.5 MG: ORAL | 30 days supply | Qty: 30 | Fill #0 | Status: AC

## 2022-10-15 ENCOUNTER — Other Ambulatory Visit: Payer: Self-pay

## 2022-10-16 ENCOUNTER — Other Ambulatory Visit: Payer: Self-pay

## 2022-10-18 ENCOUNTER — Ambulatory Visit: Payer: Self-pay | Admitting: Gerontology

## 2022-10-18 ENCOUNTER — Ambulatory Visit: Payer: Self-pay | Admitting: Gastroenterology

## 2022-10-23 ENCOUNTER — Other Ambulatory Visit: Payer: Self-pay | Admitting: Gerontology

## 2022-10-23 ENCOUNTER — Other Ambulatory Visit: Payer: Self-pay

## 2022-10-23 DIAGNOSIS — K219 Gastro-esophageal reflux disease without esophagitis: Secondary | ICD-10-CM

## 2022-10-23 MED ORDER — PANTOPRAZOLE SODIUM 40 MG PO TBEC
40.0000 mg | DELAYED_RELEASE_TABLET | Freq: Every day | ORAL | 0 refills | Status: DC
  Filled 2022-10-23: qty 30, 30d supply, fill #0

## 2022-10-23 NOTE — Telephone Encounter (Signed)
Patient must schedule OV with Benjamine Mola, NP prior to additional refills

## 2022-11-05 ENCOUNTER — Other Ambulatory Visit: Payer: Self-pay

## 2022-11-05 MED FILL — Sildenafil Citrate Tab 100 MG: ORAL | 30 days supply | Qty: 10 | Fill #1 | Status: AC

## 2022-11-13 ENCOUNTER — Other Ambulatory Visit: Payer: Self-pay

## 2022-11-14 ENCOUNTER — Other Ambulatory Visit: Payer: Self-pay

## 2022-11-26 ENCOUNTER — Other Ambulatory Visit: Payer: Self-pay | Admitting: Gerontology

## 2022-11-26 ENCOUNTER — Other Ambulatory Visit: Payer: Self-pay

## 2022-11-26 DIAGNOSIS — K219 Gastro-esophageal reflux disease without esophagitis: Secondary | ICD-10-CM

## 2022-11-27 ENCOUNTER — Other Ambulatory Visit: Payer: Self-pay

## 2022-11-27 MED ORDER — HYDROCHLOROTHIAZIDE 12.5 MG PO CAPS
ORAL_CAPSULE | ORAL | 0 refills | Status: DC
  Filled 2022-11-27: qty 30, 30d supply, fill #0

## 2022-11-27 MED ORDER — PANTOPRAZOLE SODIUM 40 MG PO TBEC
40.0000 mg | DELAYED_RELEASE_TABLET | Freq: Every day | ORAL | 0 refills | Status: DC
  Filled 2022-11-27: qty 30, 30d supply, fill #0

## 2022-12-13 ENCOUNTER — Other Ambulatory Visit: Payer: Self-pay

## 2022-12-20 ENCOUNTER — Ambulatory Visit: Payer: Self-pay | Admitting: Gerontology

## 2022-12-20 ENCOUNTER — Encounter: Payer: Self-pay | Admitting: Gerontology

## 2022-12-20 ENCOUNTER — Other Ambulatory Visit: Payer: Self-pay

## 2022-12-20 VITALS — BP 143/84 | HR 77 | Temp 98.1°F | Ht 71.0 in | Wt 214.0 lb

## 2022-12-20 DIAGNOSIS — F1010 Alcohol abuse, uncomplicated: Secondary | ICD-10-CM

## 2022-12-20 DIAGNOSIS — E782 Mixed hyperlipidemia: Secondary | ICD-10-CM

## 2022-12-20 DIAGNOSIS — I639 Cerebral infarction, unspecified: Secondary | ICD-10-CM

## 2022-12-20 DIAGNOSIS — I10 Essential (primary) hypertension: Secondary | ICD-10-CM

## 2022-12-20 MED ORDER — FOLIC ACID 1 MG PO TABS
1.0000 mg | ORAL_TABLET | Freq: Every day | ORAL | 1 refills | Status: DC
  Filled 2022-12-20: qty 90, 90d supply, fill #0
  Filled 2023-04-23: qty 90, 90d supply, fill #1

## 2022-12-20 MED ORDER — VITAMIN B-1 100 MG PO TABS
100.0000 mg | ORAL_TABLET | Freq: Every day | ORAL | 1 refills | Status: DC
  Filled 2022-12-20: qty 90, 90d supply, fill #0
  Filled 2023-04-23: qty 90, 90d supply, fill #1

## 2022-12-20 MED ORDER — PRAVASTATIN SODIUM 20 MG PO TABS
20.0000 mg | ORAL_TABLET | Freq: Every day | ORAL | 0 refills | Status: DC
  Filled 2022-12-20: qty 30, 30d supply, fill #0

## 2022-12-20 MED ORDER — CLOPIDOGREL BISULFATE 75 MG PO TABS
75.0000 mg | ORAL_TABLET | Freq: Every day | ORAL | 0 refills | Status: DC
  Filled 2022-12-20: qty 90, 90d supply, fill #0

## 2022-12-20 MED ORDER — HYDROCHLOROTHIAZIDE 12.5 MG PO CAPS
12.5000 mg | ORAL_CAPSULE | Freq: Every morning | ORAL | 0 refills | Status: DC
  Filled 2022-12-20: qty 30, 30d supply, fill #0

## 2022-12-20 NOTE — Patient Instructions (Signed)
Stroke Prevention Some medical conditions and lifestyle choices can lead to a higher risk for a stroke. You can help to prevent a stroke by eating healthy foods and exercising. It also helps to not smoke and to manage any health problems you may have. How can this condition affect me? A stroke is an emergency. It should be treated right away. A stroke can lead to brain damage or threaten your life. There is a better chance of surviving and getting better after a stroke if you get medical help right away. What can increase my risk? The following medical conditions may increase your risk of a stroke: Diseases of the heart and blood vessels (cardiovascular disease). High blood pressure (hypertension). Diabetes. High cholesterol. Sickle cell disease. Problems with blood clotting. Being very overweight. Sleeping problems (obstructivesleep apnea). Other risk factors include: Being older than age 43. A history of blood clots, stroke, or mini-stroke (TIA). Race, ethnic background, or a family history of stroke. Smoking or using tobacco products. Taking birth control pills, especially if you smoke. Heavy alcohol and drug use. Not being active. What actions can I take to prevent this? Manage your health conditions High cholesterol. Eat a healthy diet. If this is not enough to manage your cholesterol, you may need to take medicines. Take medicines as told by your doctor. High blood pressure. Try to keep your blood pressure below 130/80. If your blood pressure cannot be managed through a healthy diet and regular exercise, you may need to take medicines. Take medicines as told by your doctor. Ask your doctor if you should check your blood pressure at home. Have your blood pressure checked every year. Diabetes. Eat a healthy diet and get regular exercise. If your blood sugar (glucose) cannot be managed through diet and exercise, you may need to take medicines. Take medicines as told by your  doctor. Talk to your doctor about getting checked for sleeping problems. Signs of a problem can include: Snoring a lot. Feeling very tired. Make sure that you manage any other conditions you have. Nutrition  Follow instructions from your doctor about what to eat or drink. You may be told to: Eat and drink fewer calories each day. Limit how much salt (sodium) you use to 1,500 milligrams (mg) each day. Use only healthy fats for cooking, such as olive oil, canola oil, and sunflower oil. Eat healthy foods. To do this: Choose foods that are high in fiber. These include whole grains, and fresh fruits and vegetables. Eat at least 5 servings of fruits and vegetables a day. Try to fill one-half of your plate with fruits and vegetables at each meal. Choose low-fat (lean) proteins. These include low-fat cuts of meat, chicken without skin, fish, tofu, beans, and nuts. Eat low-fat dairy products. Avoid foods that: Are high in salt. Have saturated fat. Have trans fat. Have cholesterol. Are processed or pre-made. Count how many carbohydrates you eat and drink each day. Lifestyle If you drink alcohol: Limit how much you have to: 0-1 drink a day for women who are not pregnant. 0-2 drinks a day for men. Know how much alcohol is in your drink. In the [place] one drink equals one 12 oz bottle of beer (373m), one 5 oz glass of wine (1475m, or one 1 oz glass of hard liquor (448m Do not smoke or use any products that have nicotine or tobacco. If you need help quitting, ask your doctor. Avoid secondhand smoke. Do not use drugs. Activity  Try to stay at a  healthy weight. Get at least 30 minutes of exercise on most days, such as: Fast walking. Biking. Swimming. Medicines Take over-the-counter and prescription medicines only as told by your doctor. Avoid taking birth control pills. Talk to your doctor about the risks of taking birth control pills if: You are over 24 years old. You smoke. You get  very bad headaches. You have had a blood clot. Where to find more information American Stroke Association: www.strokeassociation.org Get help right away if: You or a loved one has any signs of a stroke. "BE FAST" is an easy way to remember the warning signs: B - Balance. Dizziness, sudden trouble walking, or loss of balance. E - Eyes. Trouble seeing or a change in how you see. F - Face. Sudden weakness or loss of feeling of the face. The face or eyelid may droop on one side. A - Arms. Weakness or loss of feeling in an arm. This happens all of a sudden and most often on one side of the body. S - Speech. Sudden trouble speaking, slurred speech, or trouble understanding what people say. T - Time. Time to call emergency services. Write down what time symptoms started. You or a loved one has other signs of a stroke, such as: A sudden, very bad headache with no known cause. Feeling like you may vomit (nausea). Vomiting. A seizure. These symptoms may be an emergency. Get help right away. Call your local emergency services (911 in the [place] Do not wait to see if the symptoms will go away. Do not drive yourself to the hospital. Summary You can help to prevent a stroke by eating healthy, exercising, and not smoking. It also helps to manage any health problems you have. Do not smoke or use any products that contain nicotine or tobacco. Get help right away if you or a loved one has any signs of a stroke. This information is not intended to replace advice given to you by your health care provider. Make sure you discuss any questions you have with your health care provider. Document Revised: 04/07/2020 Document Reviewed: 04/25/2020 Elsevier Patient Education  [place]. Alcohol Use Education Information about Your Drinking Your score on the Alcohol Use Disorders Identification Test was: AUDIT C:    TOTAL AUDIT SCORE:   .  This score places you in the category of:  Score 0 = Abstainers  Score 8-19 = Unhealthy/High Risk Drinkers  Score 1-7 = Low Risk Drinkers Score 20+ = Probable Alcohol Dependence   High Scores (20+) on the Alcohol Use Identification Test Consider becoming involved in a structured program.  You should stop drinking if: You have tried to cut down before but have not been successful, or  You suffer from morning shakes during a heavy drinking period, or You have high blood pressure, or You are pregnant, or You have liver disease, or You are taking medicines that react with alcohol, or Your alcohol use is affecting your social relationships, or You have legal consequences like DUIs, or You call in sick to work, or You cannot take care of our children, or Someone close to you says you drink too much    How Much Alcohol is a Drink: Beer: 12 oz. = 1 drink 16 oz. = 1.3 drinks 22 oz. = 2 drinks 40 oz. = 3.3 drinks  Wine: 5 oz. = 1 drink 740 mL (25 oz.) bottle = 5 drinks Malt Liquor: 12 oz. = 1.5 drinks 16 oz. = 2 drinks 22 oz. =  2.5 drinks 40 oz. = 4.5 drinks  80-Proof Spirits - Hard Liquor: 1 shot = 1 drink 1 mixed drink = number of shots Can equal 1-3 drinks   What is Low-risk Drinking? Have no more than 2 drinks of alcohol per day Drink no more than 5 days per week Do not drink alcohol drink alcohol when: You drive or operate machinery You are pregnant or breast feeding You are taking medications that interact with alcohol You have medical conditions made worse with alcohol You can stop or control your drinking      Identify Your Triggers for Drinking Parties Particular People Feeling lonely Feeling tense Family problems Feeling sad Feeling happy Feeling bored After work Problems sleeping Criticism Feelings of failure After being paid When others are drinking In bars When out for dinner After arguments Weekends Feeling restless Being in pain   Effects of High-Risk Drinking To the Brain: Aggressive, irrational  behavior Arguments, violence Depression, nervousness Alcohol dependence, memory loss To the Nervous System: Trembling hands, tingling fingers Numbness, painful nerves Impaired sensation leading to falls Numb tingling toes To Your Lifestyle: Social, legal, medical problems Domestic trouble/relationship loss Job loss & financial problems Shortened life span Accidents and death from drunk driving   To the Face: Premature aging, drinker's nose Cancer of the throat & mouth To the Body: Frequent cold Reduced resistance to infection Increased risk of pneumonia Weakness of heart muscle Heart failure, anemia Impaired blood clotting Breast cancer Vitamin deficiency, bleeding Severe Inflammation of the stomach Vomiting, diarrhea, malnutrition Ulcer, inflammation of the pancreas Impaired sexual performance Birth defects, including deformities, retardation, and low birthweight   Ways to Cope Without Drinking Go home if you tend to drink after work Find another activity Switch to nonalcoholic beverages Change friends Join a Financial planner Visit relatives Plan/take a trip Go for a walk Take up a hobby Listen to music Talk to a friend Reading What would you do if you had no worries about failing?         Good Reasons for Drinking Less I will live longer - probably 8-10 years. I will sleep better. I will be happier. I will save a lot of money My relationships will improve. I will stay younger for longer. I will achieve more in my life There will be a greater chance that I will survive to a healthy old age with no premature damage to my brain.  I will be better at my job. I will be less likely to feel depressed and commit suicide (6 times less likely). I will be less likely to die of heart disease or cancer. Other people will respect me I will be less likely to get into trouble with the police. The possibility that I will die of liver disease will be dramatically  reduced (12 times less likely). It will be less likely that I will die in a car accident (3 times less likely).   Strategies for Cutting Down Keep Track.  Find a way to keep track of how much you drink.  If you make a note of each drink before you drink it, this will help slow you down. Count and Measure.  Know the standard drink sizes.  Ask the bartender or server about the amount of alcohol in a mixed drink. Set Goals.  Decide how many days a week you will drink and how many drinks each day. Pace and Space.  When you do drink, pace yourself.  Have no more than one drink  with alcohol per hour.  Alternate "drink spacers" non-alcoholic drinks such as water, soda, or juice with drinks containing alcohol. Include Food.  Don't drink on an empty stomach.  Have some food so the alcohol will be absorbed more slowly into your system.  Avoid Triggers.  Avoid people, places, or activities that have led to drinking in the past.  Certain times of day or feelings may also be triggers.  Make a plan so you will know what you can do instead of drinking. Plan to Handle Urges.  When an urge hits, consider these options:  Remind yourself of your reasons for changing.  Or talk it through with someone you trust. Or get involved with a healthy, distracting activity.  Or, "urge surf" - instead of fighting the feeling, accept it and ride it out, knowing it will soon crest like a wave and pass. Know Your "No".  Have a polite, convincing "no thanks" for those times when you may be offered a drink and don't want one.  The faster you can say no to these offers, the less likely you are to give in.  If you hesitate, it allows you time to think of excuses to go along.

## 2022-12-20 NOTE — Progress Notes (Signed)
Established Patient Office Visit  Subjective   Patient ID: Gary [name], male    DOB: September 27, 1971  Age: 52 y.o. MRN: [id number]  Chief Complaint  Patient presents with   Follow-up    HPI Gary [name] is a a 52 y/o with a history of hypertension, hyperlipidemia, seizure disorder, CVA, TIA, alcohol use, tobacco use, and GERD who presents for follow up visit. He did not get blood work before today's visit. He reports adherence to his medication as prescribed, but still drinks 4 mixed drinks each evening and smokes 5 cigarettes per day. He denies chest pain, pressure, and palpitations; denies SOB, cough, hemoptysis; denies N/V/D, GERD symptoms, hematochezia and melena; denies abdominal pain or pressure. His atorvastatin was stopped due to elevated liver enzymes in 05/2022. During hospitalization for TIA in 07/2022, it was recommended that he be started on PCSK9 inhibitor.  Gary [name] has been referred to cardiology (lipids), gastroenterology (elevated liver enzymes), and neurology (TIA/CVA) for follow up of his chronic health issues, but these specialists are located in [place] and he does not feel comfortable driving there. He reports difficulty with distance vision, stating that his vision has been like this since his stroke in 2017. He is interested in the free vision exam on 01/27/23. He reports waking with a headache each morning, but states it resolves with one dose of ibuprofen (800 mg). He has no pain at the visit today. He requests a refill of viagra, but otherwise is feeling well and offers no complaint.       Review of Systems  Constitutional:  Negative for chills, fever and weight loss.  HENT:  Negative for nosebleeds.   Eyes:  Positive for blurred vision. Negative for pain, discharge and redness.  Respiratory:  Negative for cough, hemoptysis, sputum production, shortness of breath and wheezing.   Cardiovascular:  Negative for chest pain, palpitations and claudication.   Gastrointestinal:  Negative for abdominal pain, blood in stool, constipation, diarrhea, heartburn, melena, nausea and vomiting.  Genitourinary: Negative.   Musculoskeletal:  Negative for falls.  Neurological:  Positive for headaches. Negative for dizziness, tremors and seizures.  Endo/Heme/Allergies:  Negative for polydipsia. Does not bruise/bleed easily.  Psychiatric/Behavioral:  Positive for substance abuse (alcohol). Negative for depression and suicidal ideas. The patient is not nervous/anxious and does not have insomnia.       Objective:     BP (!) 143/84   Pulse 77   Temp 98.1 F (36.7 C)   Ht '5\' 11"'$  (1.803 m)   Wt 214 lb (97.1 kg)   SpO2 96%   BMI 29.85 kg/m  BP Readings from Last 3 Encounters:  12/20/22 (!) 143/84  09/13/22 107/72  08/16/22 116/74   Wt Readings from Last 3 Encounters:  12/20/22 214 lb (97.1 kg)  09/13/22 208 lb 8 oz (94.6 kg)  08/16/22 209 lb 12.8 oz (95.2 kg)      Physical Exam Constitutional:      Appearance: Normal appearance.  Neck:     Vascular: No carotid bruit.  Cardiovascular:     Rate and Rhythm: Normal rate and regular rhythm.     Pulses: Normal pulses.     Heart sounds: Normal heart sounds. No murmur heard. Pulmonary:     Effort: Pulmonary effort is normal.     Breath sounds: Normal breath sounds.  Abdominal:     General: Bowel sounds are normal.     Palpations: Abdomen is soft.  Musculoskeletal:     Right lower leg: No edema.  Left lower leg: No edema.  Skin:    General: Skin is warm and dry.     Capillary Refill: Capillary refill takes less than 2 seconds.     Coloration: Skin is not jaundiced.  Neurological:     Mental Status: He is alert.  Psychiatric:        Mood and Affect: Mood normal.        Behavior: Behavior normal.      No results found for any visits on 12/20/22.  Last CBC Lab Results  Component Value Date   WBC 6.0 07/31/2022   HGB 14.9 07/31/2022   HCT 43.1 07/31/2022   MCV 100.2 (H)  07/31/2022   MCH 34.7 (H) 07/31/2022   RDW 12.2 07/31/2022   PLT 238 [id number]   Last metabolic panel Lab Results  Component Value Date   GLUCOSE 123 (H) 08/16/2022   NA 136 08/16/2022   K 4.9 08/16/2022   CL 99 08/16/2022   CO2 20 08/16/2022   BUN 25 (H) 08/16/2022   CREATININE 1.66 (H) 08/16/2022   EGFR 50 (L) 08/16/2022   CALCIUM 10.0 08/16/2022   PHOS 3.0 08/16/2022   PROT 7.9 08/16/2022   ALBUMIN 4.5 08/16/2022   LABGLOB 3.4 08/16/2022   AGRATIO 1.3 08/16/2022   BILITOT 0.6 08/16/2022   ALKPHOS 111 08/16/2022   AST 72 (H) 08/16/2022   ALT 74 (H) 08/16/2022   ANIONGAP 12 07/31/2022   Last lipids Lab Results  Component Value Date   CHOL 291 (H) 07/31/2022   HDL 34 (L) 07/31/2022   LDLCALC 235 (H) 07/31/2022   TRIG 112 07/31/2022   CHOLHDL 8.6 07/31/2022   Last hemoglobin A1c Lab Results  Component Value Date   HGBA1C 5.5 07/31/2022   Last thyroid functions Lab Results  Component Value Date   TSH 1.400 02/02/2021   Last vitamin D No results found for: "25OHVITD2", "25OHVITD3", "VD25OH" Last vitamin B12 and Folate Lab Results  Component Value Date   VITAMINB12 447 12/29/2020   FOLATE 5.2 12/03/2018      The ASCVD Risk score (Arnett DK, et al., 2019) failed to calculate for the following reasons:   The patient has a prior MI or stroke diagnosis    Assessment & Plan:   1. Cerebrovascular accident (CVA), unspecified mechanism ([place]) - strongly encouraged lifestyle modification.  - clopidogrel (PLAVIX) 75 MG tablet; Take 1 tablet (75 mg total) by mouth daily.  Dispense: 90 tablet; Refill: 0  2. Mixed hyperlipidemia - strongly encouraged lifestyle modification. Will add pravastatin low dose to reduce cholesterol. Follow up for bloodwork and in clinic 1 month. - He will consider new referral to cardiology for PCSK9 inhibitor once he has glasses to drive to [place]. - pravastatin (PRAVACHOL) 20 MG tablet; Take 1 tablet (20 mg total) by mouth daily.   Dispense: 30 tablet; Refill: 0  3. Primary hypertension - strongly encouraged lifestyle modification and medication adherence.  - hydrochlorothiazide (MICROZIDE) 12.5 MG capsule; Take 1 capsule (12.5 mg total) by mouth in the morning.  Dispense: 30 capsule; Refill: 0 - Urine Microalbumin w/creat. ratio; Future  4. Alcohol abuse - strongly encouraged cessation, or at least reduce intake. He is still pre-contemplative. - thiamine (VITAMIN B1) 100 MG tablet; Take 1 tablet (100 mg total) by mouth daily.  Dispense: 90 tablet; Refill: 1 - folic acid (FOLVITE) 1 MG tablet; Take 1 tablet (1 mg total) by mouth daily.  Dispense: 90 tablet; Refill: 1  5. Erectile dysfunction - he is advised  to contact urology for refills of viagra.  Harvin Hazel, RN

## 2022-12-21 ENCOUNTER — Other Ambulatory Visit: Payer: Self-pay

## 2023-01-01 ENCOUNTER — Other Ambulatory Visit: Payer: Self-pay

## 2023-01-01 ENCOUNTER — Other Ambulatory Visit: Payer: Self-pay | Admitting: Gerontology

## 2023-01-01 DIAGNOSIS — K219 Gastro-esophageal reflux disease without esophagitis: Secondary | ICD-10-CM

## 2023-01-01 MED ORDER — PANTOPRAZOLE SODIUM 40 MG PO TBEC
40.0000 mg | DELAYED_RELEASE_TABLET | Freq: Every day | ORAL | 0 refills | Status: DC
  Filled 2023-01-01: qty 30, 30d supply, fill #0

## 2023-01-10 IMAGING — MR MR HEAD W/O CM
13 series · 48 of 48 positions shown · non-contrast
Comparison: MRI head 12/29/2020

CLINICAL DATA: Stroke follow-up. Recurrent episodes of confusion,
weakness, dizziness

EXAM:
MRI HEAD WITHOUT CONTRAST
TECHNIQUE: Multiplanar, multiecho pulse sequences of the brain and surrounding
structures were obtained without intravenous contrast.

[Series 5: ax dwi_tracew · axial · 3.0mm · 0.71mm/px · z∈[-65,+99]mm · 7 of 112 slices shown]
[im 1/112]
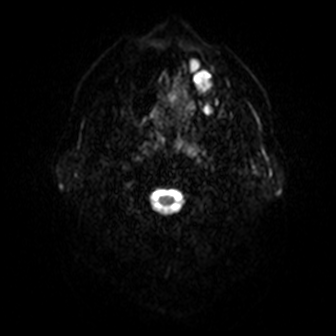
[im 19/112]
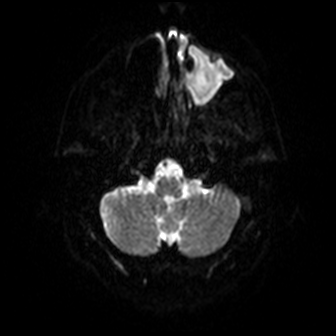
[im 38/112]
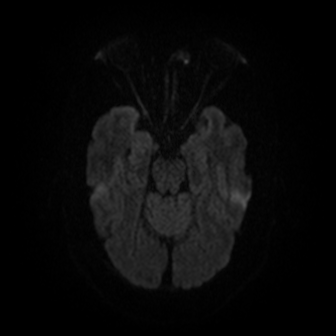
[im 56/112]
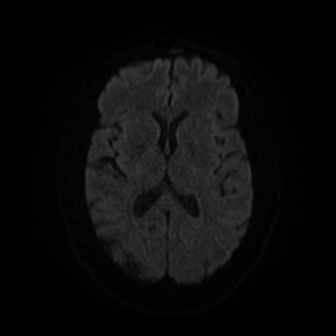
[im 75/112]
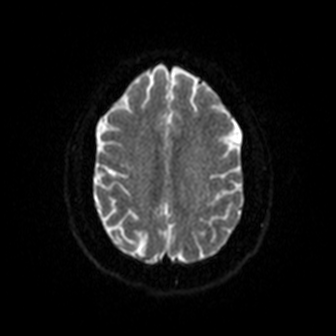
[im 93/112]
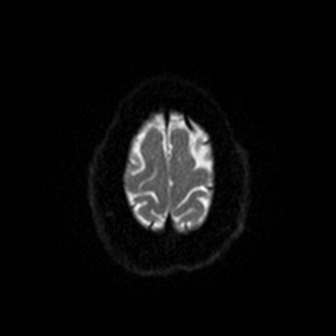
[im 112/112]
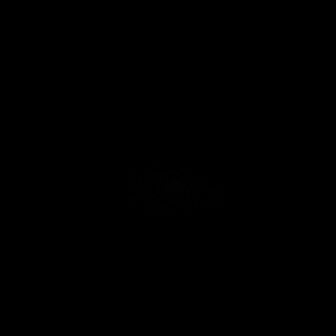

[Series 6: ax dwi_adc · axial · 3.0mm · 0.71mm/px · z∈[-65,+96]mm · 3 of 55 slices shown]
[im 1/55]
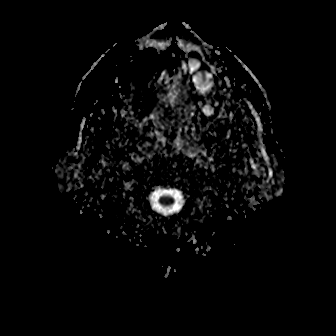
[im 28/55]
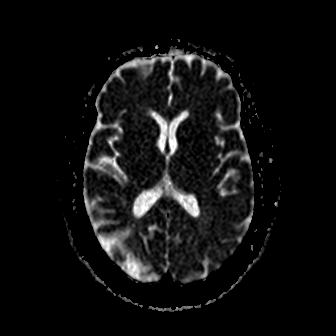
[im 55/55]
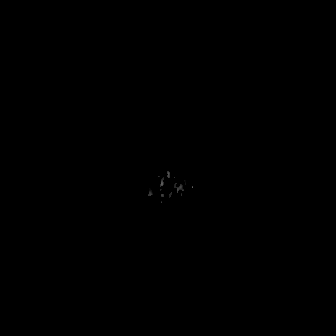

[Series 7: cor dwi_tracew · coronal · 5.0mm · 0.68mm/px · 5 of 80 slices shown]
[im 1/80]
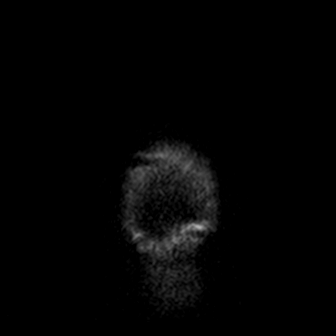
[im 20/80]
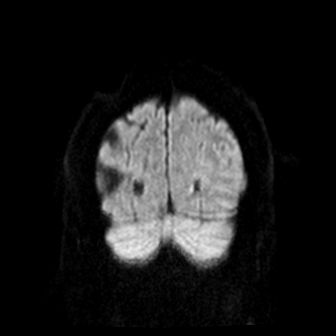
[im 40/80]
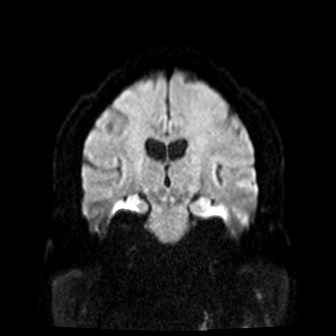
[im 60/80]
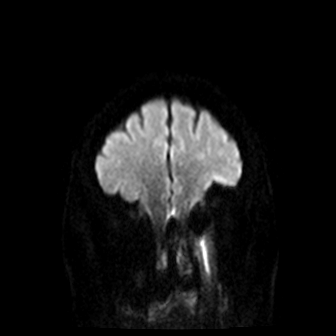
[im 80/80]
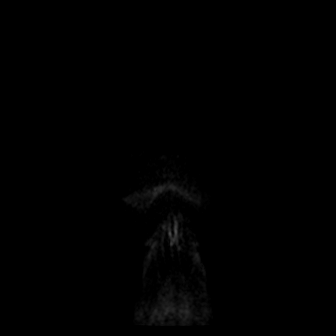

[Series 8: cor dwi_adc · coronal · 5.0mm · 0.68mm/px · 2 of 40 slices shown]
[im 1/40]
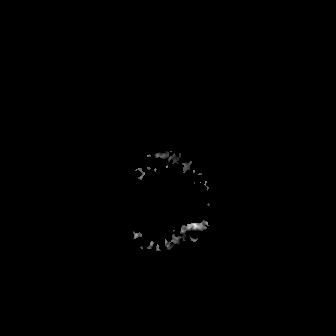
[im 40/40]
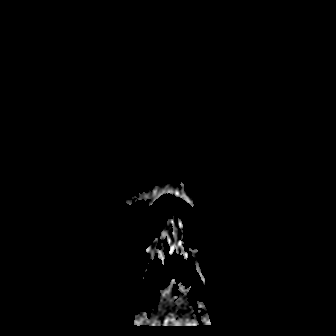

[Series 9: T1 · sagittal · 5.0mm · 0.47mm/px · 1 of 23 slices shown (1 of 2)]
[im 1/23]
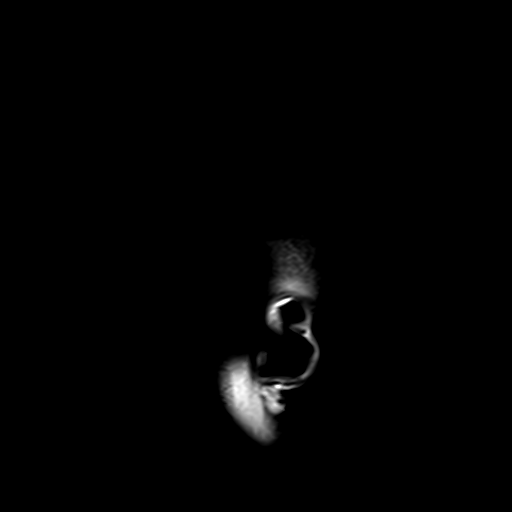

[Series 10: T2 · axial · 5.0mm · 0.86mm/px · z∈[-56,+86]mm · 2 of 25 slices shown (1 of 2)]
[im 1/25]
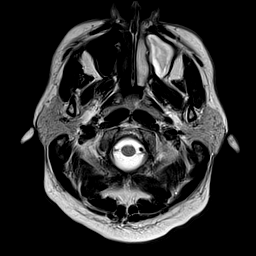
[im 25/25]
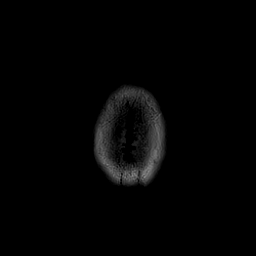

[Series 11: mag_images · axial · 3.0mm · 0.90mm/px · z∈[-60,+91]mm · 3 of 52 slices shown]
[im 1/52]
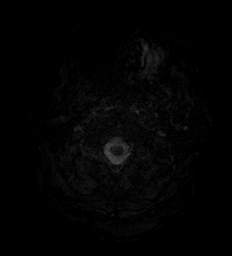
[im 26/52]
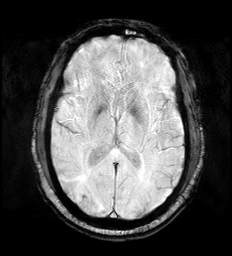
[im 52/52]
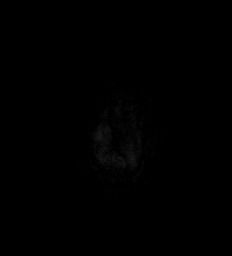

[Series 12: pha_images · axial · 3.0mm · 0.90mm/px · z∈[-60,+91]mm · 3 of 52 slices shown]
[im 1/52]
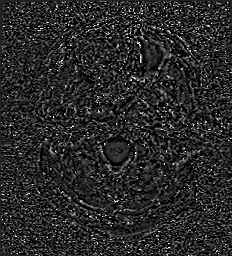
[im 26/52]
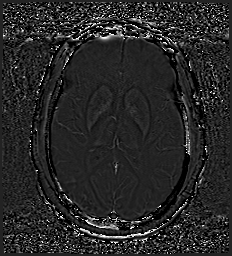
[im 52/52]
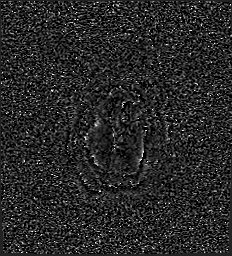

[Series 13: swi_images · axial · 3.0mm · 0.90mm/px · z∈[-60,+91]mm · 3 of 52 slices shown]
[im 1/52]
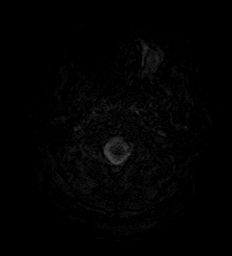
[im 26/52]
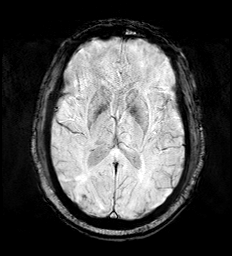
[im 52/52]
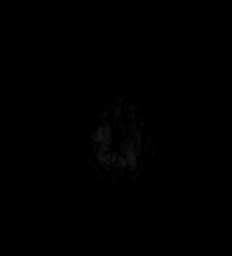

[Series 14: mip_images(sw) · axial · 24.0mm · 0.90mm/px · z∈[-50,+80]mm · 3 of 45 slices shown]
[im 1/45]
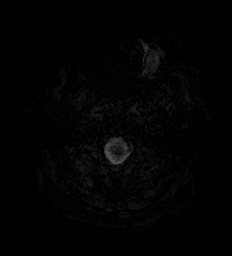
[im 23/45]
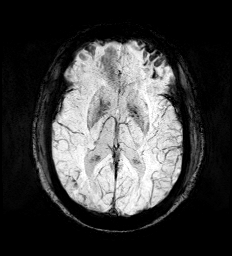
[im 45/45]
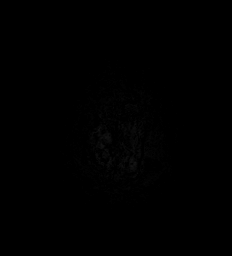

[Series 15: FLAIR · axial · 3.0mm · 0.69mm/px · z∈[-65,+95]mm · 3 of 55 slices shown]
[im 1/55]
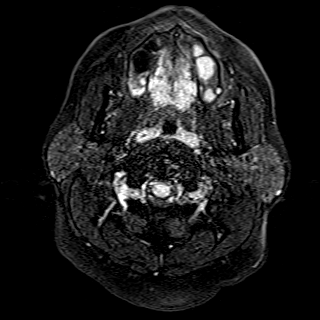
[im 28/55]
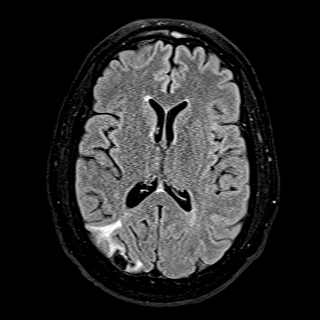
[im 55/55]
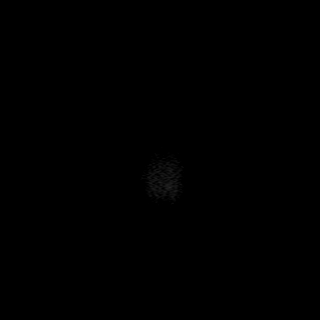

[Series 16: T1 · axial · 1.0mm · 0.98mm/px · z∈[-69,+104]mm · 11 of 175 slices shown (2 of 2)]
[im 1/175]
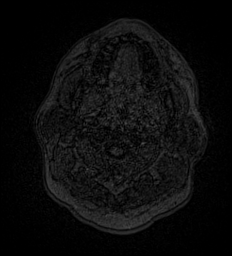
[im 18/175]
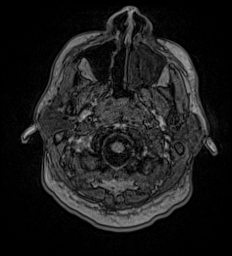
[im 35/175]
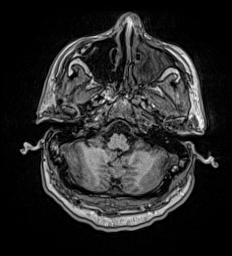
[im 53/175]
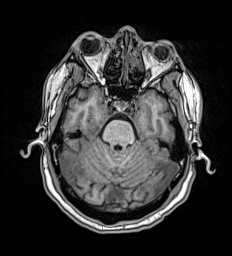
[im 70/175]
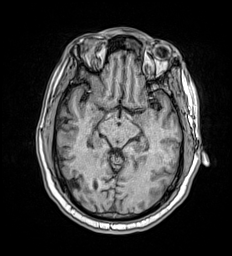
[im 88/175]
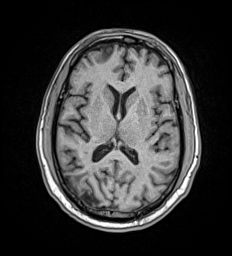
[im 105/175]
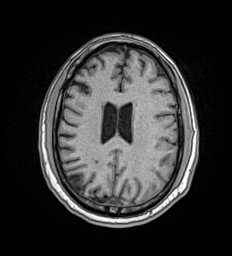
[im 122/175]
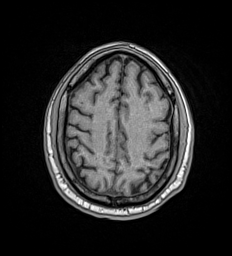
[im 140/175]
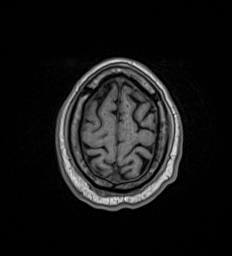
[im 157/175]
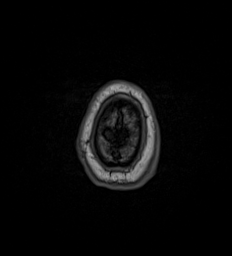
[im 175/175]
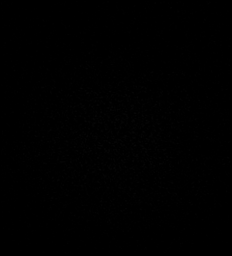

[Series 17: T2 · coronal · 5.0mm · 0.86mm/px · 2 of 30 slices shown (2 of 2)]
[im 1/30]
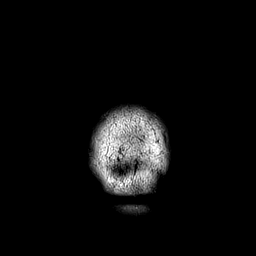
[im 30/30]
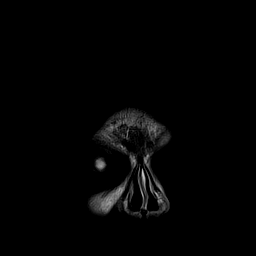

[48 of 48 positions shown; findings below may reference images not displayed]

FINDINGS: Brain: Prior diffusion-weighted imaging was positive for small
punctate embolic infarcts in the left frontal parietal lobe. These
have largely resolved, there is a small cortical lesion left
occipital lobe on diffusion which is likely T2 shine through from
chronic infarct. This was present previously.

No new area of acute infarct.

Chronic hemorrhagic infarct right occipital lobe. Few small white
matter hyperintensities. Negative for mass lesion.

Vascular: Normal arterial flow voids.

Skull and upper cervical spine: No focal lesion.

Sinuses/Orbits: Extensive mucosal edema left frontal, ethmoid, and
maxillary sinus unchanged from prior study. Negative orbit

Other: None
IMPRESSION: Negative for acute infarct

Resolving small embolic infarcts in the left frontal parietal lobe
compared with the MRI of 12/27/2020

Chronic hemorrhagic infarct right occipital lobe.

## 2023-01-22 ENCOUNTER — Other Ambulatory Visit: Payer: Self-pay

## 2023-01-23 ENCOUNTER — Other Ambulatory Visit: Payer: Self-pay

## 2023-01-23 ENCOUNTER — Ambulatory Visit: Payer: Self-pay | Admitting: Gerontology

## 2023-01-23 ENCOUNTER — Encounter: Payer: Self-pay | Admitting: Gerontology

## 2023-01-23 DIAGNOSIS — E782 Mixed hyperlipidemia: Secondary | ICD-10-CM

## 2023-01-23 DIAGNOSIS — I10 Essential (primary) hypertension: Secondary | ICD-10-CM

## 2023-01-23 DIAGNOSIS — R7989 Other specified abnormal findings of blood chemistry: Secondary | ICD-10-CM

## 2023-01-23 NOTE — Patient Instructions (Addendum)
Cooking With Less Salt Cooking with less salt is one way to reduce the amount of sodium you get from food. Sodium is one of the elements that make up salt. It is found naturally in foods and is also added to certain foods. Depending on your condition and overall health, your health care provider or dietitian may recommend that you reduce your sodium intake. Most people should have less than 2,300 milligrams (mg) of sodium each day. If you have high blood pressure (hypertension), you may need to limit your sodium to 1,500 mg each day. Follow the tips below to help reduce your sodium intake. What are tips for eating less sodium? Reading food labels  Check the food label before buying or using packaged ingredients. Always check the label for the serving size and sodium content. Look for products with no more than 140 mg of sodium in one serving. Check the % Daily Value column to see what percent of the daily recommended amount of sodium is provided in one serving of the product. Foods with 5% or less in this column are considered low in sodium. Foods with 20% or higher are considered high in sodium. Do not choose foods with salt as one of the first three ingredients on the ingredients list. If salt is one of the first three ingredients, it usually means the item is high in sodium. Shopping Buy sodium-free or low-sodium products. Look for the following words on food labels: Low-sodium. Sodium-free. Reduced-sodium. No salt added. Unsalted. Always check the sodium content even if foods are labeled as low-sodium or no salt added. Buy fresh foods. Cooking Use herbs, seasonings without salt, and spices as substitutes for salt. Use sodium-free baking soda when baking. Grill, braise, or roast foods to add flavor with less salt. Avoid adding salt to pasta, rice, or hot cereals. Drain and rinse canned vegetables, beans, and meat before use. Avoid adding salt when cooking sweets and desserts. Cook with  low-sodium ingredients. What foods are high in sodium? Vegetables Regular canned vegetables (not low-sodium or reduced-sodium). Sauerkraut, pickled vegetables, and relishes. Olives. French fries. Onion rings. Regular canned tomato sauce and paste. Regular tomato and vegetable juice. Frozen vegetables in sauces. Grains Instant hot cereals. Bread stuffing, pancake, and biscuit mixes. Croutons. Seasoned rice or pasta mixes. Noodle soup cups. Boxed or frozen macaroni and cheese. Regular salted crackers. Self-rising flour. Rolls. Bagels. Flour tortillas and wraps. Meats and other proteins Meat or fish that is salted, canned, smoked, cured, spiced, or pickled. This includes bacon, ham, sausages, hot dogs, corned beef, chipped beef, meat loaves, salt pork, jerky, pickled herring, anchovies, regular canned tuna, and sardines. Salted nuts. Dairy Processed cheese and cheese spreads. Cheese curds. Blue cheese. Feta cheese. String cheese. Regular cottage cheese. Buttermilk. Canned milk. The items listed above may not be a complete list of foods high in sodium. Actual amounts of sodium may be different depending on processing. Contact a dietitian for more information. What foods are low in sodium? Fruits Fresh, frozen, or canned fruit with no sauce added. Fruit juice. Vegetables Fresh or frozen vegetables with no sauce added. "No salt added" canned vegetables. "No salt added" tomato sauce and paste. Low-sodium or reduced-sodium tomato and vegetable juice. Grains Noodles, pasta, quinoa, rice. Shredded or puffed wheat or puffed rice. Regular or quick oats (not instant). Low-sodium crackers. Low-sodium bread. Whole-grain bread and whole-grain pasta. Unsalted popcorn. Meats and other proteins Fresh or frozen whole meats, poultry (not injected with sodium), and fish with no sauce added.   Unsalted nuts. Dried peas, beans, and lentils without added salt. Unsalted canned beans. Eggs. Unsalted nut butters. Low-sodium  canned tuna or chicken. Dairy Milk. Soy milk. Yogurt. Low-sodium cheeses, such as Swiss, [address], [place], and [employer]. Sherbet or ice cream (keep to  cup per serving). Cream cheese. Fats and oils Unsalted butter or margarine. Other foods Homemade pudding. Sodium-free baking soda and baking powder. Herbs and spices. Low-sodium seasoning mixes. Beverages Coffee and tea. Carbonated beverages. The items listed above may not be a complete list of foods low in sodium. Actual amounts of sodium may be different depending on processing. Contact a dietitian for more information. What are some salt alternatives when cooking? The following are herbs, seasonings, and spices that can be used instead of salt to flavor your food. Herbs should be fresh or dried. Do not choose packaged mixes. Next to the name of the herb, spice, or seasoning are some examples of foods you can pair it with. Herbs [place] leaves - Soups, meat and vegetable dishes, and spaghetti sauce. Basil - [employer], soups, pasta, and fish dishes. Cilantro - Meat, poultry, and vegetable dishes. Chili powder - Marinades and Mexican dishes. Chives - Salad dressings and potato dishes. Cumin - Mexican dishes, couscous, and meat dishes. Dill - Fish dishes, sauces, and salads. Fennel - Meat and vegetable dishes, breads, and cookies. Garlic (do not use garlic salt) - [place] dishes, meat dishes, salad dressings, and sauces. Marjoram - Soups, potato dishes, and meat dishes. Oregano - Pizza and spaghetti sauce. Parsley - Salads, soups, pasta, and meat dishes. Rosemary - [place] dishes, salad dressings, soups, and red meats.Alcohol Misuse and Nutrition Alcohol misuse is any pattern of alcohol consumption that harms your health, relationships, or work. Alcohol misuse can cause poor nutrition (malnutrition or malnourishment) and a lack of nutrients (nutrient deficiencies), which can lead to more health problems. If you think that you have  an alcohol dependency problem, or if it is hard to stop drinking because you feel sick or different when you do not use alcohol, talk with your health care provider or another health professional about where to get help. Nutrition is an essential factor in recovering from alcohol misuse. Your health care provider or diet and nutrition specialist (dietitian) will work with you to design a plan that can help to restore nutrients to your body and prevent the risk of complications. How does alcohol misuse affect my nutrition? Alcohol misuse brings malnutrition and nutrient deficiencies in two ways: It causes your liver to work abnormally. This affects how your body divides (breaks down) and absorbs nutrients from food. It causes you to eat poorly. Many people who drink alcohol do not eat enough carbohydrates, protein, fat, vitamins, and minerals. Nutrients that are commonly lacking in people who drink alcohol include: Vitamins. Vitamin A. This is needed for your vision, metabolism, and ability to fight off infections (immunity). B vitamins. These include folate, thiamine, and niacin. These are needed for new cell growth. Vitamin C. This plays an important role in wound healing, immunity, and helping your body to absorb iron. Vitamin D. This is necessary for your body to absorb and use calcium. It is produced by your liver, but you can also get it from food and from sun exposure. Minerals. Calcium. This is needed for healthy bones as well as heart and blood vessel (cardiovascular) function. Iron. This is important for blood, muscle, and nervous system functioning. Magnesium. This plays an important role in muscle and nerve function, and it  helps to control blood sugar and blood pressure. Zinc. This is important for the normal functioning of your nervous system and digestive system (gastrointestinal tract). What is my nutrition plan? Your dietitian may develop a specific eating plan that is based on your  condition and any other problems that you have. An eating plan will commonly include: A balanced diet. Grains: 6-8 "ounce-equivalents" a day. Examples of an ounce-equivalent of whole grains include 1 cup (60 g) of whole-wheat cereal,  cup (79 g) of brown rice, or 1 slice of whole-wheat bread. Vegetables: 2-3 cups a day. Examples of 1 cup of vegetables include 2 medium carrots, 1 large tomato, or 2 stalks of celery. Fruits: 1-2 cups a day. Examples of 1 cup of fruit include 1 large banana, 1 small apple, 8 large strawberries, or 1 large orange. Meat and other protein: 5-6 oz (142-170 g) a day. Foods that provide 1 oz of protein include 1 egg,  cup (28 g) of nuts or seeds, or 1 tablespoon (16 g) of peanut butter. Dairy: 2-3 cups a day. Examples of 1 cup of dairy include 8 oz (230 mL) of milk, 8 oz (230 g) of yogurt, or 1 oz (44 g) of natural cheese. Vitamin and mineral supplements. What are tips for following this plan? Eat frequent meals and snacks. Try to eat 5-6 small meals each day. Take vitamin or mineral supplements as recommended by your dietitian. If you are malnourished or if your dietitian recommends it: You may follow a high-protein, high-calorie diet. This may include: 2,000-3,000 calories (kilocalories) a day. 70-100 g (grams) of protein a day. You may be directed to follow a diet that includes a complete nutritional supplement drink. This can help to restore calories, protein, and vitamins to your body. Depending on your condition, you may be advised to consume this drink instead of your meals or in addition to your meals. What foods should I eat? Eat foods that are high in molecules that prevent oxygen from reacting with your food (antioxidants). These foods include grapes, berries, nuts, green tea, and dark green or orange vegetables. Eating these can help to prevent some of the stress that is placed on your liver by consuming alcohol. Eat a variety of fresh fruits and vegetables  each day. This will help you to get fiber and vitamins in your diet. Drink plenty of water and other clear fluids, such as apple juice and broth. Try to drink at least 48-64 oz (1.5-2 L) of water a day. Include foods fortified with vitamins and minerals in your diet. Commonly fortified foods include milk, orange juice, cereal, and bread. Eat a variety of foods that are high in omega-6 fatty acids. These include fish, nuts and seeds, and soybeans. These foods may help your liver to recover and may also stabilize your mood. If you are a vegetarian: Eat a variety of protein-rich foods. Pair whole grains with plant-based proteins at meals and snack time. For example, eat rice with beans, put peanut butter on whole-grain toast, or eat oatmeal with sunflower seeds. The items listed above may not be a complete list of foods and beverages you can eat. Contact a dietitian for more information. What foods should I avoid? Avoid foods and drinks that are high in fat and sugar. Sugary drinks, salty snacks, and candy contain empty calories. This means that they lack important nutrients such as protein, fiber, and vitamins. Avoid alcohol. This is the best way to avoid malnutrition due to alcohol misuse. If you  must drink, drink measured amounts. Measured drinking means limiting your intake to no more than 1 drink a day for women who are not pregnant and 2 drinks a day for men. One drink equals 12 oz (355 mL) of beer, 5 oz (148 mL) of wine, or 1 oz (44 mL) of hard liquor. Limit your intake of caffeine. Replace drinks like coffee and black tea with decaffeinated coffee and decaffeinated herbal tea. The items listed above may not be a complete list of foods and beverages you should avoid. Contact a dietitian for more information. Summary Alcohol misuse can cause poor nutrition and a lack of nutrients, which can lead to more health problems. Common nutrient deficiencies include vitamin deficiencies (A, B, C, and D)  and mineral deficiencies (calcium, iron, magnesium, and zinc). Nutrition is an essential factor in the therapy for recovery from alcohol misuse. Your health care provider and dietitian can help you to develop a specific eating plan that includes a balanced diet plus vitamin and mineral supplements. This information is not intended to replace advice given to you by your health care provider. Make sure you discuss any questions you have with your health care provider. Document Revised: 11/29/2021 Document Reviewed: 11/29/2021 Elsevier Patient Education  2023 Else[employer] Inc. Smoking Tobacco Information, Adult Smoking tobacco can be harmful to your health. Tobacco contains a toxic colorless chemical called nicotine. Nicotine causes changes in your brain that make you want more and more. This is called addiction. This can make it hard to stop smoking once you start. Tobacco also has other toxic chemicals that can hurt your body and raise your risk of many cancers. Menthol or "lite" tobacco or cigarette brands are not safer than regular brands. How can smoking tobacco affect me? Smoking tobacco puts you at risk for: Cancer. Smoking is most commonly associated with lung cancer, but can also lead to cancer in other parts of the body. Chronic obstructive pulmonary disease (COPD). This is a long-term lung condition that makes it hard to breathe. It also gets worse over time. High blood pressure (hypertension), heart disease, stroke, heart attack, and lung infections, such as pneumonia. Cataracts. This is when the lenses in the eyes become clouded. Digestive problems. This may include peptic ulcers, heartburn, and gastroesophageal reflux disease (GERD). Oral health problems, such as gum disease, mouth sores, and tooth loss. Loss of taste and smell. Smoking also affects how you look and smell. Smoking may cause: Wrinkles. Yellow or stained teeth, fingers, and fingernails. Bad breath. Bad-smelling clothes  and hair. Smoking tobacco can also affect your social life, because: It may be challenging to find places to smoke when away from home. Many workplaces, [employer], hotels, and public places are tobacco-free. Smoking is expensive. This is due to the cost of tobacco and the long-term costs of treating health problems from smoking. Secondhand smoke may affect those around you. Secondhand smoke can cause lung cancer, breathing problems, and heart disease. Children of smokers have a higher risk for: Sudden infant death syndrome (SIDS). Ear infections. Lung infections. What actions can I take to prevent health problems? Quit smoking  Do not start smoking. Quit if you already smoke. Do not replace cigarette smoking with vaping devices, such as e-cigarettes. Make a plan to quit smoking and commit to it. Look for programs to help you, and ask your health care provider for recommendations and ideas. Set a date and write down all the reasons you want to quit. Let your friends and family know you are  quitting so they can help and support you. Consider finding friends who also want to quit. It can be easier to quit with someone else, so that you can support each other. Talk with your health care provider about using nicotine replacement medicines to help you quit. These include gum, lozenges, patches, sprays, or pills. If you try to quit but return to smoking, stay positive. It is common to slip up when you first quit, so take it one day at a time. Be prepared for cravings. When you feel the urge to smoke, chew gum or suck on hard candy. Lifestyle Stay busy. Take care of your body. Get plenty of exercise, eat a healthy diet, and drink plenty of water. Find ways to manage your stress, such as meditation, yoga, exercise, or time spent with friends and family. Ask your health care provider about having regular tests (screenings) to check for cancer. This may include blood tests, imaging tests, and other  tests. Where to find support To get support to quit smoking, consider: Asking your health care provider for more information and resources. Joining a support group for people who want to quit smoking in your local community. There are many effective programs that may help you to quit. Calling the smokefree.gov counselor helpline at 1-800-QUIT-NOW (330)618-7997). Where to find more information You may find more information about quitting smoking from: Centers for Disease Control and Prevention: http://www.osborne.com/ [employer]: smokefree.gov American Lung Association: freedomfromsmoking.org Contact a health care provider if: You have problems breathing. Your lips, nose, or fingers turn blue. You have chest pain. You are coughing up blood. You feel like you will faint. You have other health changes that cause you to worry. Summary Smoking tobacco can negatively affect your health, the health of those around you, your finances, and your social life. Do not start smoking. Quit if you already smoke. If you need help quitting, ask your health care provider. Consider joining a support group for people in your local community who want to quit smoking. There are many effective programs that may help you to quit. This information is not intended to replace advice given to you by your health care provider. Make sure you discuss any questions you have with your health care provider. Document Revised: 09/19/2021 Document Reviewed: 09/19/2021 Elsevier Patient Education  2023 [employer] Inc. Stroke Prevention Some medical conditions and lifestyle choices can lead to a higher risk for a stroke. You can help to prevent a stroke by eating healthy foods and exercising. It also helps to not smoke and to manage any health problems you may have. How can this condition affect me? A stroke is an emergency. It should be treated right away. A stroke can lead to brain damage or threaten your life. There is a better  chance of surviving and getting better after a stroke if you get medical help right away. What can increase my risk? The following medical conditions may increase your risk of a stroke: Diseases of the heart and blood vessels (cardiovascular disease). High blood pressure (hypertension). Diabetes. High cholesterol. Sickle cell disease. Problems with blood clotting. Being very overweight. Sleeping problems (obstructivesleep apnea). Other risk factors include: Being older than age 76. A history of blood clots, stroke, or mini-stroke (TIA). Race, ethnic background, or a family history of stroke. Smoking or using tobacco products. Taking birth control pills, especially if you smoke. Heavy alcohol and drug use. Not being active. What actions can I take to prevent this? Manage your health conditions High cholesterol.  Eat a healthy diet. If this is not enough to manage your cholesterol, you may need to take medicines. Take medicines as told by your doctor. High blood pressure. Try to keep your blood pressure below 130/80. If your blood pressure cannot be managed through a healthy diet and regular exercise, you may need to take medicines. Take medicines as told by your doctor. Ask your doctor if you should check your blood pressure at home. Have your blood pressure checked every year. Diabetes. Eat a healthy diet and get regular exercise. If your blood sugar (glucose) cannot be managed through diet and exercise, you may need to take medicines. Take medicines as told by your doctor. Talk to your doctor about getting checked for sleeping problems. Signs of a problem can include: Snoring a lot. Feeling very tired. Make sure that you manage any other conditions you have. Nutrition  Follow instructions from your doctor about what to eat or drink. You may be told to: Eat and drink fewer calories each day. Limit how much salt (sodium) you use to 1,500 milligrams (mg) each day. Use only  healthy fats for cooking, such as olive oil, canola oil, and sunflower oil. Eat healthy foods. To do this: Choose foods that are high in fiber. These include whole grains, and fresh fruits and vegetables. Eat at least 5 servings of fruits and vegetables a day. Try to fill one-half of your plate with fruits and vegetables at each meal. Choose low-fat (lean) proteins. These include low-fat cuts of meat, chicken without skin, fish, tofu, beans, and nuts. Eat low-fat dairy products. Avoid foods that: Are high in salt. Have saturated fat. Have trans fat. Have cholesterol. Are processed or pre-made. Count how many carbohydrates you eat and drink each day. Lifestyle If you drink alcohol: Limit how much you have to: 0-1 drink a day for women who are not pregnant. 0-2 drinks a day for men. Know how much alcohol is in your drink. In the [place] one drink equals one 12 oz bottle of beer ( ), one 5 oz glass of wine ( ), or one 1 oz glass of hard liquor (44mL). Do not smoke or use any products that have nicotine or tobacco. If you need help quitting, ask your doctor. Avoid secondhand smoke. Do not use drugs. Activity  Try to stay at a healthy weight. Get at least 30 minutes of exercise on most days, such as: Fast walking. Biking. Swimming. Medicines Take over-the-counter and prescription medicines only as told by your doctor. Avoid taking birth control pills. Talk to your doctor about the risks of taking birth control pills if: You are over 42 years old. You smoke. You get very bad headaches. You have had a blood clot. Where to find more information American Stroke Association: www.strokeassociation.org Get help right away if: You or a loved one has any signs of a stroke. "BE FAST" is an easy way to remember the warning signs: B - Balance. Dizziness, sudden trouble walking, or loss of balance. E - Eyes. Trouble seeing or a change in how you see. F - Face. Sudden weakness or loss  of feeling of the face. The face or eyelid may droop on one side. A - Arms. Weakness or loss of feeling in an arm. This happens all of a sudden and most often on one side of the body. S - Speech. Sudden trouble speaking, slurred speech, or trouble understanding what people say. T - Time. Time to call emergency services. Write down what time symptoms  started. You or a loved one has other signs of a stroke, such as: A sudden, very bad headache with no known cause. Feeling like you may vomit (nausea). Vomiting. A seizure. These symptoms may be an emergency. Get help right away. Call your local emergency services (911 in the [place] Do not wait to see if the symptoms will go away. Do not drive yourself to the hospital. Summary You can help to prevent a stroke by eating healthy, exercising, and not smoking. It also helps to manage any health problems you have. Do not smoke or use any products that contain nicotine or tobacco. Get help right away if you or a loved one has any signs of a stroke. This information is not intended to replace advice given to you by your health care provider. Make sure you discuss any questions you have with your health care provider. Document Revised: 04/07/2020 Document Reviewed: 04/25/2020 Elsevier Patient Education  2023 Else[employer] Inc. Budget-Friendly Healthy Eating There are many ways to save money at the grocery store and continue to eat healthy. This can work if you: Make a grocery list and only buy food that is on the list. Plan meals that fit your budget. Make food yourself at home. What are tips for following this plan? Reading food labels  Compare food labels between brand name and store brand foods. Often the nutritional value is the same, but the store brand is cheaper. Look for products that do not have added sugar, fat, or salt (sodium). These often cost the same but are healthier for you. Products may be labeled  as: Sugar-free. Nonfat. Low-fat. Sodium-free. Low-sodium. Look for lean ground beef labeled as at least 93% lean and 7% fat. Prepare for shopping Make a grocery list and bring it to the store. If you have a mobile phone, you could use it to create your shopping list. Check store apps and online, and look in newspapers for weekly deals. Check for coupons, but use coupons only for foods and brands you normally buy. Do not buy items you would not normally buy just because they are on sale. If possible, go to other stores to find the best prices. Consider shopping at [employer], larger [employer], local fruit and vegetable stands, and farmers markets. Shopping Buy only what is on your grocery list. Go only to the areas of the store that have the items on your list. Look at the unit prices on the shelf or price tag. Use it to find out which items are the best deals. Do not shop when you are hungry. If you are hungry, it may be hard to stick to your list and budget. Look at the top and bottom shelves for deals. Foods at eye level (for both adults and children) usually cost more. Be efficient with your time when shopping. The more time you are at the store, the more money you will likely spend. What to look for when shopping Choose healthy items that are often low cost, such as carrots, potatoes, apples, bananas, and oranges. Dried or canned beans are a low-cost protein source. If you can, buy in bulk. Items you can buy in bulk include meats, fish, poultry, frozen fruits, and frozen vegetables, herbs, spices, flour, pasta, nuts, and dried fruit. Try not to buy: "Ready-to-eat" foods, such as pre-cut fruits and vegetables and pre-made salads. Chips, cookies, and other "junk food." These items are usually expensive and not healthy. Sodas and other sweetened drinks. Choose water instead. Fruits and  vegetables that are out of season. Healthy in-season foods usually cost less. Buy a variety of  vegetables and fruits by getting fresh, frozen, and canned items. To save money when getting more expensive foods like meats and dairy: Choose cheaper cuts of meat, such as bone-in chicken thighs and drumsticks, instead of skinless and boneless chicken. When you are ready to prepare the chicken, you can remove the skin to make it healthier. Choose lean meats like chicken or [place] instead of beef. Choose seafood canned in water, such as tuna, salmon, or sardines. Buy eggs as a lower-cost source of protein. Buy dried beans and peas, such as lentils, split peas, or kidney beans instead of meats. Dried beans and peas are other good sources of protein. Buy large tubs of yogurt instead of one-serving size. Cooking Make extra food and freeze the extra in meal-sized containers or in individual portions for fast meals and snacks. Pre-cook on days when you have extra time to prepare meals. You can keep these meals in the fridge or freezer and reheat for a quick meal. When you come home from the store, wash, peel, and cut up fruits and vegetables so they are ready to use and eat. This will help keep you from eating less healthy snacks and reduce food waste. Meal planning Do not eat out or get fast food. Make food at home. Plan meals and snacks using the grocery list and budget you create. Use leftovers in your meal plan for the week. Look for recipes where you can cook once and make enough food for many meals. Prepare budget-friendly types of meals like stews, casseroles, and stir-fry dishes. Try some meatless meals or try "no cook" meals like salads. Make sure that half your plate is filled with fruits or vegetables. Choose from fresh, frozen, or canned fruits and vegetables. If eating canned, remember to rinse them before eating. This will remove any excess salt added for packaging. Where to find more information U.[place]. Department of Agriculture: [employer] This information is not intended to replace  advice given to you by your health care provider. Make sure you discuss any questions you have with your health care provider. Document Revised: 07/02/2022 Document Reviewed: 07/02/2022 Elsevier Patient Education  2023 [employer].

## 2023-01-23 NOTE — Progress Notes (Signed)
Established Patient Office Visit  Subjective   Patient ID: Gary [name], male    DOB: 18-Jan-1971  Age: 52 y.o. MRN: [id number]  No chief complaint on file.  HPI Gary [name] is a a 52 y/o with a history of HTN, hyperlipidemia, seizure disorder, CVA, TIA, alcohol use, tobacco use, and GERD who presents for follow up of medication management. He did not get his blood work completed prior to today's visit, we will collect urine microalbumin, lipid panel, and CMP while he is here today. His atorvastatin was stopped due to elevated liver enzymes in 05/2022. At his last visit he was started on pravastatin 20 mg daily. He reports adherence to all his medication as prescribed and denies any side effects. He reports he still drinks 3-4 mixed drinks each evening and smokes 5 cigarettes per day with no current desire to stop drinking alcohol and smoking cigarettes. He denies chest pain, chest tightness, and palpitations; denies SOB, cough, hemoptysis; denies N/V/D, GERD symptoms, hematochezia and melena; denies abdominal pain. During hospitalization for TIA in 07/2022, it was recommended that he be started on PCSK9 inhibitor after seeing cardiology. He denies making any appointments to see cardiology (TIA/CVA hx), GI (elevated liver enzymes), urology (E.D), and neurology (TIA/CVA hx and seizure disorder). Overall, he states that he's doing well and offers no further complaints.  Review of Systems  Constitutional: Negative.   HENT: Negative.    Eyes: Negative.   Respiratory: Negative.    Cardiovascular: Negative.   Gastrointestinal: Negative.   Genitourinary: Negative.   Musculoskeletal: Negative.   Skin: Negative.   Neurological: Negative.   Endo/Heme/Allergies: Negative.   Psychiatric/Behavioral: Negative.       Objective:     There were no vitals taken for this visit. BP Readings from Last 3 Encounters:  01/23/23 (!) 152/103  12/20/22 (!) 143/84  09/13/22 107/72   Wt Readings from Last 3  Encounters:  01/23/23 216 lb 4.8 oz (98.1 kg)  12/20/22 214 lb (97.1 kg)  09/13/22 208 lb 8 oz (94.6 kg)     Physical Exam Vitals reviewed.  Constitutional:      Appearance: Normal appearance.  HENT:     Head: Normocephalic.     Right Ear: External ear normal.     Left Ear: External ear normal.     Nose: Nose normal.     Mouth/Throat:     Mouth: Mucous membranes are moist.     Pharynx: Oropharynx is clear.  Eyes:     Extraocular Movements: Extraocular movements intact.     Conjunctiva/sclera: Conjunctivae normal.     Pupils: Pupils are equal, round, and reactive to light.  Cardiovascular:     Rate and Rhythm: Normal rate and regular rhythm.     Pulses: Normal pulses.     Heart sounds: Normal heart sounds.  Pulmonary:     Effort: Pulmonary effort is normal.     Breath sounds: Rhonchi (cleared after patient coughed) present.  Abdominal:     General: Abdomen is flat.     Palpations: Abdomen is soft.  Genitourinary:    Comments: Deferred pt request  Musculoskeletal:        General: Normal range of motion.     Cervical back: Normal range of motion and neck supple.  Skin:    General: Skin is warm and dry.     Capillary Refill: Capillary refill takes less than 2 seconds.  Neurological:     General: No focal deficit present.     Mental  Status: He is alert and oriented to person, place, and time.  Psychiatric:        Mood and Affect: Mood normal.        Behavior: Behavior normal.    No results found for any visits on 01/23/23.  Last CBC Lab Results  Component Value Date   WBC 6.0 07/31/2022   HGB 14.9 07/31/2022   HCT 43.1 07/31/2022   MCV 100.2 (H) 07/31/2022   MCH 34.7 (H) 07/31/2022   RDW 12.2 07/31/2022   PLT 238 07/31/2022   Last metabolic panel Lab Results  Component Value Date   GLUCOSE 123 (H) 08/16/2022   NA 136 08/16/2022   K 4.9 08/16/2022   CL 99 08/16/2022   CO2 20 08/16/2022   BUN 25 (H) 08/16/2022   CREATININE 1.66 (H) 08/16/2022   EGFR 50  (L) 08/16/2022   CALCIUM 10.0 08/16/2022   PHOS 3.0 08/16/2022   PROT 7.9 08/16/2022   ALBUMIN 4.5 08/16/2022   LABGLOB 3.4 08/16/2022   AGRATIO 1.3 08/16/2022   BILITOT 0.6 08/16/2022   ALKPHOS 111 08/16/2022   AST 72 (H) 08/16/2022   ALT 74 (H) 08/16/2022   ANIONGAP 12 07/31/2022   Last lipids Lab Results  Component Value Date   CHOL 291 (H) 07/31/2022   HDL 34 (L) 07/31/2022   LDLCALC 235 (H) 07/31/2022   TRIG 112 07/31/2022   CHOLHDL 8.6 07/31/2022   Last hemoglobin A1c Lab Results  Component Value Date   HGBA1C 5.5 07/31/2022   Last thyroid functions Lab Results  Component Value Date   TSH 1.400 02/02/2021   Last vitamin D No results found for: "25OHVITD2", "25OHVITD3", "VD25OH" Last vitamin B12 and Folate Lab Results  Component Value Date   VITAMINB12 447 12/29/2020   FOLATE 5.2 12/03/2018      The ASCVD Risk score (Arnett DK, et al., 2019) failed to calculate for the following reasons:   The patient has a prior MI or stroke diagnosis    Assessment & Plan:  1. Primary hypertension His blood pressure is not under control at this time. It was 152/103 at today's visit, his goal should be <130/80. He was encouraged to check his blood pressure once a day at home and record the readings and make a log to bring with him back to his next visit. He was encouraged to follow the DASH diet, increase his water intake, and exercise as tolerated. He was encouraged to quit smoking cigarettes. He will continue on his current medication and got to the ED for signs and symptoms of heart attack or stroke. - Urine Microalbumin w/creat. ratio  2. Mixed hyperlipidemia He was advised to schedule appointment with Cardiology. He was referred to cardiology for possible initiation of PCSK9 inhibitor after his TIA 07/2022. Continue low-cholesterol diet and exercise as tolerated. He will continue on his current medication and got to the ED for signs and symptoms of heart attack or  stroke. - Lipid panel  3. Elevated serum creatinine He was encouraged to abstain from alcohol consumption and increase his water intake. He was encouraged to check his blood pressure once a day at home and record the readings and make a log to bring with him back to his next visit.  - Comp Met (CMET)   Follow up in 1-2 weeks for lab review and PRN as needed.  Myra Gianotti, RN

## 2023-01-24 ENCOUNTER — Other Ambulatory Visit: Payer: Self-pay

## 2023-01-24 ENCOUNTER — Telehealth: Payer: Self-pay | Admitting: Emergency Medicine

## 2023-01-24 LAB — COMPREHENSIVE METABOLIC PANEL

## 2023-01-24 NOTE — Telephone Encounter (Signed)
Patient called in today c/o severe headache all day. Patient has taken aspirin without relief. Patient has a history of HTN, stroke and TIA. Advised patient per Lanora Manis, NP that given his history he should go to the ED for evaluation. Patient agreed and voiced understanding.

## 2023-01-25 ENCOUNTER — Other Ambulatory Visit: Payer: Self-pay

## 2023-01-25 ENCOUNTER — Other Ambulatory Visit: Payer: Self-pay | Admitting: Gerontology

## 2023-01-25 LAB — COMPREHENSIVE METABOLIC PANEL
Albumin/Globulin Ratio: 1.5 (ref 1.2–2.2)
Albumin: 4.6 g/dL (ref 3.8–4.9)
Alkaline Phosphatase: 109 IU/L (ref 44–121)
BUN: 18 mg/dL (ref 6–24)
Bilirubin Total: 0.6 mg/dL (ref 0.0–1.2)
Chloride: 100 mmol/L (ref 96–106)
Creatinine, Ser: 1.57 mg/dL — ABNORMAL HIGH (ref 0.76–1.27)
Globulin, Total: 3 g/dL (ref 1.5–4.5)
Potassium: 4.1 mmol/L (ref 3.5–5.2)
Sodium: 141 mmol/L (ref 134–144)

## 2023-01-25 LAB — MICROALBUMIN / CREATININE URINE RATIO
Creatinine, Urine: 161.1 mg/dL
Microalb/Creat Ratio: 4 mg/g creat (ref 0–29)
Microalbumin, Urine: 6.5 ug/mL

## 2023-01-25 LAB — LIPID PANEL
Chol/HDL Ratio: 5.2 ratio — ABNORMAL HIGH (ref 0.0–5.0)
Cholesterol, Total: 243 mg/dL — ABNORMAL HIGH (ref 100–199)
HDL: 47 mg/dL (ref >39)
LDL Chol Calc (NIH): 175 mg/dL — ABNORMAL HIGH (ref 0–99)
Triglycerides: 115 mg/dL (ref 0–149)
VLDL Cholesterol Cal: 21 mg/dL (ref 5–40)

## 2023-01-29 ENCOUNTER — Other Ambulatory Visit: Payer: Self-pay

## 2023-01-29 MED FILL — Cyanocobalamin Tab 500 MCG: ORAL | 90 days supply | Qty: 180 | Fill #0 | Status: CN

## 2023-02-04 ENCOUNTER — Other Ambulatory Visit: Payer: Self-pay | Admitting: Gerontology

## 2023-02-04 ENCOUNTER — Other Ambulatory Visit: Payer: Self-pay

## 2023-02-04 DIAGNOSIS — K219 Gastro-esophageal reflux disease without esophagitis: Secondary | ICD-10-CM

## 2023-02-04 DIAGNOSIS — E782 Mixed hyperlipidemia: Secondary | ICD-10-CM

## 2023-02-04 DIAGNOSIS — I10 Essential (primary) hypertension: Secondary | ICD-10-CM

## 2023-02-05 ENCOUNTER — Other Ambulatory Visit: Payer: Self-pay

## 2023-02-05 MED FILL — Pravastatin Sodium Tab 20 MG: ORAL | 30 days supply | Qty: 30 | Fill #0 | Status: CN

## 2023-02-05 MED FILL — Pantoprazole Sodium EC Tab 40 MG (Base Equiv): ORAL | 30 days supply | Qty: 30 | Fill #0 | Status: CN

## 2023-02-05 MED FILL — Hydrochlorothiazide Cap 12.5 MG: ORAL | 30 days supply | Qty: 30 | Fill #0 | Status: CN

## 2023-02-06 ENCOUNTER — Other Ambulatory Visit: Payer: Self-pay

## 2023-02-06 ENCOUNTER — Encounter: Payer: Self-pay | Admitting: Gerontology

## 2023-02-06 ENCOUNTER — Ambulatory Visit: Payer: Self-pay | Admitting: Gerontology

## 2023-02-06 VITALS — BP 151/66 | HR 68 | Temp 98.1°F | Resp 16 | Ht 70.0 in | Wt 214.0 lb

## 2023-02-06 DIAGNOSIS — R748 Abnormal levels of other serum enzymes: Secondary | ICD-10-CM

## 2023-02-06 DIAGNOSIS — K219 Gastro-esophageal reflux disease without esophagitis: Secondary | ICD-10-CM

## 2023-02-06 DIAGNOSIS — E782 Mixed hyperlipidemia: Secondary | ICD-10-CM

## 2023-02-06 DIAGNOSIS — I639 Cerebral infarction, unspecified: Secondary | ICD-10-CM

## 2023-02-06 DIAGNOSIS — I10 Essential (primary) hypertension: Secondary | ICD-10-CM

## 2023-02-06 MED ORDER — CLOPIDOGREL BISULFATE 75 MG PO TABS
75.0000 mg | ORAL_TABLET | Freq: Every day | ORAL | 0 refills | Status: DC
Start: 2023-02-06 — End: 2023-06-20
  Filled 2023-02-06 – 2023-05-29 (×2): qty 90, 90d supply, fill #0

## 2023-02-06 MED ORDER — PRAVASTATIN SODIUM 40 MG PO TABS
40.0000 mg | ORAL_TABLET | Freq: Every day | ORAL | 1 refills | Status: DC
Start: 2023-02-06 — End: 2023-03-07
  Filled 2023-02-06: qty 30, 30d supply, fill #0

## 2023-02-06 MED ORDER — PANTOPRAZOLE SODIUM 40 MG PO TBEC
40.0000 mg | DELAYED_RELEASE_TABLET | Freq: Every day | ORAL | 0 refills | Status: DC
Start: 2023-02-06 — End: 2023-03-07
  Filled 2023-02-07: qty 30, 30d supply, fill #0

## 2023-02-06 NOTE — Patient Instructions (Signed)

## 2023-02-06 NOTE — Progress Notes (Signed)
Established Patient Office Visit  Subjective   Patient ID: Gary [name], male    DOB: 08-01-1971  Age: 52 y.o. MRN: [id number]  No chief complaint on file.   Hypertension  HPI:  Gary [name] is a a 52 y/o with a history of HTN, hyperlipidemia, seizure disorder, CVA, TIA, alcohol use, tobacco use, and GERD who presents for follow up and review lab. He states he is compliant to prescribed medications and denies side effects. His Bp 160/93 mmHg at today's visit, re checked Bp 151/66 mmHg. He states he hasn't taken HCTZ  since last Saturday. He checked Bp three time a week at home, ranged 120-130/70-80 mmHg.He denies headache, dizziness, blurry vision, chest pain, and shortness of breath.  His serum creatinine decreased from 1.66 to 1.57 mg/dl. Lab on 01/23/2023 AST 99 IU/L and ALT 76 are increased from 72 IU/L and 74 IU/L on 08/17/2023.He states he drinks alcohol mixed ice and soda every day and smokes 5 cigarettes a day. His cholesterol 243 mg/dL and LDL 782 mg/dL on 9/56/2130. Atorvastatin was stopped on 05/10/2022 due to elevated liver enzymes. But started Pravastatin 20 mg po daily on 01/23/2023. He was referred to cardiologist and gastroenterologist in November 2023. But he forgot these schedules for visiting. He states his headache has resolved. Overall, he is doing good and offers no further complaints.  Review of Systems  Constitutional: Negative.   HENT: Negative.    Eyes: Negative.   Respiratory: Negative.    Cardiovascular: Negative.   Gastrointestinal: Negative.   Genitourinary: Negative.   Musculoskeletal: Negative.   Skin: Negative.   Neurological: Negative.   Endo/Heme/Allergies: Negative.   Psychiatric/Behavioral: Negative.        Objective:     There were no vitals taken for this visit. BP Readings from Last 3 Encounters:  02/06/23 (!) 160/93  01/23/23 (!) 165/101  12/20/22 (!) 143/84   Wt Readings from Last 3 Encounters:  02/06/23 214 lb (97.1 kg)  01/23/23  216 lb 4.8 oz (98.1 kg)  12/20/22 214 lb (97.1 kg)      Physical Exam Constitutional:      Appearance: Normal appearance.  HENT:     Head: Normocephalic.     Right Ear: Tympanic membrane normal.     Left Ear: Tympanic membrane normal.     Nose: Nose normal.     Mouth/Throat:     Mouth: Mucous membranes are moist.  Eyes:     Extraocular Movements: Extraocular movements intact.     Pupils: Pupils are equal, round, and reactive to light.  Cardiovascular:     Rate and Rhythm: Normal rate and regular rhythm.     Pulses: Normal pulses.     Heart sounds: Normal heart sounds.  Pulmonary:     Effort: Pulmonary effort is normal.     Breath sounds: Normal breath sounds.  Abdominal:     General: Abdomen is flat.     Palpations: Abdomen is soft.  Musculoskeletal:        General: Normal range of motion.     Cervical back: Normal range of motion and neck supple.  Skin:    General: Skin is warm and dry.  Neurological:     General: No focal deficit present.     Mental Status: He is alert and oriented to person, place, and time.  Psychiatric:        Mood and Affect: Mood normal.     No results found for any visits on 02/06/23.  Last  CBC Lab Results  Component Value Date   WBC 6.0 07/31/2022   HGB 14.9 07/31/2022   HCT 43.1 07/31/2022   MCV 100.2 (H) 07/31/2022   MCH 34.7 (H) 07/31/2022   RDW 12.2 07/31/2022   PLT 238 07/31/2022   Last metabolic panel Lab Results  Component Value Date   GLUCOSE 86 01/23/2023   NA 141 01/23/2023   K 4.1 01/23/2023   CL 100 01/23/2023   CO2 21 01/23/2023   BUN 18 01/23/2023   CREATININE 1.57 (H) 01/23/2023   EGFR 53 (L) 01/23/2023   CALCIUM 9.8 01/23/2023   PHOS 3.0 08/16/2022   PROT 7.6 01/23/2023   ALBUMIN 4.6 01/23/2023   LABGLOB 3.0 01/23/2023   AGRATIO 1.5 01/23/2023   BILITOT 0.6 01/23/2023   ALKPHOS 109 01/23/2023   AST 99 (H) 01/23/2023   ALT 76 (H) 01/23/2023   ANIONGAP 12 07/31/2022   Last lipids Lab Results   Component Value Date   CHOL 243 (H) 01/23/2023   HDL 47 01/23/2023   LDLCALC 175 (H) 01/23/2023   TRIG 115 01/23/2023   CHOLHDL 5.2 (H) 01/23/2023   Last hemoglobin A1c Lab Results  Component Value Date   HGBA1C 5.5 07/31/2022   Last thyroid functions Lab Results  Component Value Date   TSH 1.400 02/02/2021   Last vitamin D No results found for: "25OHVITD2", "25OHVITD3", "VD25OH" Last vitamin B12 and Folate Lab Results  Component Value Date   VITAMINB12 447 12/29/2020   FOLATE 5.2 12/03/2018      The ASCVD Risk score (Arnett DK, et al., 2019) failed to calculate for the following reasons:   The patient has a prior MI or stroke diagnosis    Assessment & Plan:  1. Primary hypertension - His blood pressure is not under control, his goal should be less than 130/80. His HCTZ was discontinued due to elevated serum creatinine, was advised to check and record blood pressure daily at home. He was encouraged to continue on DASH diet and exercise 150 minutes a week as tolerated. He was encouraged on alcohol  abstinence and smoking cessation. He will continue current medication.   2. Mixed hyperlipidemia He has history of TIA. His LDL 175 mg/dL.  His Pravastatin was increased to 40 mg, encouraged to continue on low  fat/cholesterol diet,and exercise 150 minutes a week as tolerated. He was educated to call office or go to ED if having severe headache, chest pain, shortness of breath, trouble speaking and blurry vision. - pravastatin (PRAVACHOL) 40 MG tablet; Take 1 tablet (40 mg total) by mouth daily.  Dispense: 30 tablet; Refill: 1 - Ambulatory referral to Cardiology  3. Cerebrovascular accident (CVA), unspecified mechanism (HCC) He was educated to call office or go to ED if having severe headache, chest pain, shortness of breath, trouble speaking and blurry vision. Continue current medications.  - clopidogrel (PLAVIX) 75 MG tablet; Take 1 tablet (75 mg total) by mouth daily.   Dispense: 90 tablet; Refill: 0  4. Gastroesophageal reflux disease, unspecified whether esophagitis present - His acid reflux is under control, and will continue current medications. -Avoid spicy, fatty and fried food -Avoid sodas and sour juices -Avoid heavy meals -Avoid eating 4 hours before bedtime -Elevate head of bed at night  - pantoprazole (PROTONIX) 40 MG tablet; Take 1 tablet (40 mg total) by mouth daily.  Dispense: 30 tablet; Refill: 0  5. Elevated liver enzymes -  He was strongly encouraged on alcohol abstinence and will follow up with Gastroenterology. -  Ambulatory referral to Gastroenterology    Follow up in one month 03/12/2023 in clinic.     Odette Fraction, NP

## 2023-02-07 ENCOUNTER — Other Ambulatory Visit: Payer: Self-pay

## 2023-02-07 ENCOUNTER — Other Ambulatory Visit (HOSPITAL_BASED_OUTPATIENT_CLINIC_OR_DEPARTMENT_OTHER): Payer: Self-pay

## 2023-02-08 ENCOUNTER — Other Ambulatory Visit: Payer: Self-pay

## 2023-02-14 ENCOUNTER — Other Ambulatory Visit: Payer: Self-pay

## 2023-02-14 ENCOUNTER — Ambulatory Visit (INDEPENDENT_AMBULATORY_CARE_PROVIDER_SITE_OTHER): Payer: Self-pay | Admitting: Physician Assistant

## 2023-02-14 ENCOUNTER — Encounter: Payer: Self-pay | Admitting: Physician Assistant

## 2023-02-14 VITALS — BP 175/101 | HR 76 | Temp 98.3°F | Ht 71.0 in | Wt 216.8 lb

## 2023-02-14 DIAGNOSIS — R748 Abnormal levels of other serum enzymes: Secondary | ICD-10-CM

## 2023-02-14 DIAGNOSIS — F1020 Alcohol dependence, uncomplicated: Secondary | ICD-10-CM

## 2023-02-14 DIAGNOSIS — E782 Mixed hyperlipidemia: Secondary | ICD-10-CM

## 2023-02-14 NOTE — Patient Instructions (Addendum)
Ultrasound scheduled 02/21/23 @ 9:15 [facility] of [facility]. Nothing to eat/drink 6 hours prior.  Please go directly to the [facility] drug store on [address] to have labs drawn today.

## 2023-02-14 NOTE — Progress Notes (Signed)
[name], PA-C [address]  [address]  [place], [place] [address]  Main: [phone]  Fax: [phone]   Gastroenterology Consultation  Referring Provider:     Regino Bellow, NP Primary Care Physician:  Regino Bellow, NP Primary Gastroenterologist:  [name], PA-C / Dr. Wyline Mood  Reason for Consultation:     Elevated liver enzymes        HPI:   Gary [name] is a 52 y.o. y/o male referred for consultation & management  by Regino Bellow, NP.    Here to evaluate elevated LFTs.  He denies any GI symptoms such as abdominal pain, diarrhea, constipation, rectal bleeding, abdominal swelling, jaundice, extremity edema, or weight loss.  He has no family history of liver disease.  No previous GI evaluation.  He admits to drinking 3 - 4 mixed drinks (liquor) every day for over 20 years.  Currently taking pantoprazole 40 mg once daily with good control of acid reflux.  He has had elevated liver transaminases since 2020.  Labs 02/2022 showed elevated AST 131, ALT 119.  Labs 07/2022 showed AST 148, ALT 137.  Most recent labs 01/23/2023 showed improved, yet elevated AST 99, ALT 76.  Total bilirubin and alkaline phosphatase have been normal.  He has not had any abdominal imaging.  Has history of hyperlipidemia and is on pravastatin.  Negative HIV test 07/2022.  Has not had any viral hepatitis testing or vaccine.  No new medications or OTC herbal supplements.  Past Medical History:  Diagnosis Date   Alcohol use    Carotid artery occlusion    GERD (gastroesophageal reflux disease)    Hypercholesteremia    Hypertension    Seizures (HCC)    Stroke (HCC)    TIA 12/29/20, re-admitted 01/14/21 for sequelae   Tobacco abuse     Past Surgical History:  Procedure Laterality Date   NO PAST SURGERIES      Prior to Admission medications   Medication Sig Start Date End Date Taking? Authorizing Provider  amLODipine (NORVASC) 10 MG tablet Take 1 tablet (10 mg total) by mouth every  evening. 07/12/22  Yes Tukov-Yual, Alroy Bailiff, NP  aspirin 81 MG tablet Take 1 tablet (81 mg total) by mouth daily. 11/01/17  Yes Kallie Locks, FNP  blood glucose meter kit and supplies KIT Use as directed to check blood glucose 2-3 times per week and as needed 02/22/22  Yes Tukov-Yual, Magdalene S, NP  carvedilol (COREG) 25 MG tablet Take 1 tablet (25 mg total) by mouth 2 (two) times daily with a meal. 07/12/22  Yes Tukov-Yual, Magdalene S, NP  clopidogrel (PLAVIX) 75 MG tablet Take 1 tablet (75 mg total) by mouth daily. 02/06/23 02/06/24 Yes Odette Fraction, NP  folic acid (FOLVITE) 1 MG tablet Take 1 tablet (1 mg total) by mouth daily. 12/20/22  Yes Iloabachie, Chioma E, NP  glucose blood (RIGHTEST GS550 BLOOD GLUCOSE) test strip Use as directed to check blood glucose 2-3 times per week and as needed 02/22/22  Yes Tukov-Yual, Magdalene S, NP  ibuprofen (ADVIL) 200 MG tablet Take 800 mg by mouth daily as needed for headache or moderate pain (usually takes daily for headaches, leg pain in am).   Yes [provider]  levETIRAcetam (KEPPRA) 1000 MG tablet Take 2 tablets (2,000 mg total) by mouth twice daily. 02/22/22  Yes Tukov-Yual, Alroy Bailiff, NP  losartan (COZAAR) 100 MG tablet Take 1 tablet (100 mg total) by mouth in the morning. 07/12/22  Yes Tukov-Yual, Magdalene S, NP  magnesium oxide (MAG-OX) 400 MG tablet Take 1 tablet (400 mg total) by mouth daily. 07/12/22  Yes Tukov-Yual, Magdalene S, NP  pantoprazole (PROTONIX) 40 MG tablet Take 1 tablet (40 mg total) by mouth daily. 02/06/23  Yes Odette Fraction, NP  pravastatin (PRAVACHOL) 40 MG tablet Take 1 tablet (40 mg total) by mouth daily. 02/06/23  Yes Odette Fraction, NP  Rightest GL300 Lancets MISC Use as directed to check blood glucose 2-3 times per week and as needed 02/22/22  Yes Tukov-Yual, Magdalene S, NP  sildenafil (VIAGRA) 100 MG tablet Take 1 tablet 1 hour prior to intercourse. 07/02/22  Yes Stoioff, Verna Czech, MD  thiamine (VITAMIN B-1) 100 MG tablet Take 1 tablet  (100 mg total) by mouth daily. 12/20/22  Yes Iloabachie, Chioma E, NP  vitamin B-12 (CYANOCOBALAMIN) 500 MCG tablet Take 2 tablets (1,000 mcg total) by mouth daily. 01/29/23  Yes Iloabachie, Chioma E, NP    Family History  Problem Relation Age of Onset   Hypertension Mother        pt reports mother has stent   Other Father        unknown medical history   Healthy Sister    Other Maternal Grandmother        unknown medical history   Other Maternal Grandfather 54       choked on a watermelon seed   Other Paternal Grandmother        unknown medical history   Other Paternal Grandfather        unknown medical history     Social History   Tobacco Use   Smoking status: Every Day    Packs/day: 0.25    Years: 34.00    Additional pack years: 0.00    Total pack years: 8.50    Types: Cigarettes   Smokeless tobacco: Never   Tobacco comments:    Pt reports he has thoughts of quitting, has cut back to 5 cigarettes per day  Vaping Use   Vaping Use: Never used  Substance Use Topics   Alcohol use: Yes    Alcohol/week: 21.0 standard drinks of alcohol    Types: 21 Standard drinks or equivalent per week    Comment: 3-4 mixed drinks daily   Drug use: No    Allergies as of 02/14/2023   (No Known Allergies)    Review of Systems:    All systems reviewed and negative except where noted in HPI.   Physical Exam:  BP (!) 175/101   Pulse 76   Temp 98.3 F (36.8 C)   Ht 5\' 11"  (1.803 m)   Wt 216 lb 12.8 oz (98.3 kg)   BMI 30.24 kg/m  No LMP for male patient. Psych:  Alert and cooperative. Normal mood and affect. General:   Alert,  Well-developed, well-nourished, pleasant and cooperative in NAD Head:  Normocephalic and atraumatic. Eyes:  Sclera clear, no icterus.   Conjunctiva pink. Ears:  Normal auditory acuity. Neck:  Supple; no masses or thyromegaly. Lungs:  Respirations even and unlabored.  Clear throughout to auscultation.   No wheezes, crackles, or rhonchi. No acute  distress. Heart:  Regular rate and rhythm; no murmurs, clicks, rubs, or gallops. Abdomen:  Normal bowel sounds.  No bruits.  Soft, non-tender and non-distended without masses, hepatosplenomegaly or hernias noted.  No guarding or rebound tenderness.    Neurologic:  Alert and oriented x3;  grossly normal neurologically. Psych:  Alert and cooperative. Normal mood and  affect.  Imaging Studies: No results found.  Assessment and Plan:   Gary [name] is a 52 y.o. y/o male has been referred for elevated liver transaminases since 2020.  History of drinking 3 or 4 mixed drinks daily for over 20 years.  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  Fatty liver disease is also in the differential given his history of hyperlipidemia.  I am ordering abdominal ultrasound and more lab work to evaluate for other sources of elevated LFTs.  Liver transaminases, Most likley due to Alcohol.  Ordering RUQ abdominal ultrasound.  Labs: AMA, ANA, ASMA, alpha-1 antitrypsin, immunoglobulins, ceruloplasmin, viral hepatitis A/B/C, iron and ferritin, CBC, hepatic panel.  2.   Moderate alcohol use disorder  Encouraged him to abstain from all alcohol.  The discussion regarding risk of cirrhosis with prolonged alcohol use.  3.   Hyperlipidemia  Continue pravastatin.  Recommend low-fat diet.  4.   Colon Cancer Screening  Discuss Cologuard versus colonoscopy at next office visit.  Follow up in 3 Months with DR. Anna.  Plan to repeat LFTs in 3 months.  Also follow-up based on above lab and ultrasound results.  [name], PA-C

## 2023-02-15 LAB — ANTI-MICROSOMAL ANTIBODY LIVER / KIDNEY: LKM1 Ab: 0.4 Units (ref 0.0–20.0)

## 2023-02-15 LAB — ALPHA-1-ANTITRYPSIN: A-1 Antitrypsin: 202 mg/dL — ABNORMAL HIGH (ref 101–187)

## 2023-02-15 LAB — CBC WITH DIFFERENTIAL
Basophils Absolute: 0 10*3/uL (ref 0.0–0.2)
Immature Granulocytes: 0 %
MCH: 33.4 pg — ABNORMAL HIGH (ref 26.6–33.0)
MCV: 97 fL (ref 79–97)
Monocytes Absolute: 0.5 10*3/uL (ref 0.1–0.9)
Neutrophils: 49 %
WBC: 6.5 10*3/uL (ref 3.4–10.8)

## 2023-02-15 LAB — ANA: Anti Nuclear Antibody (ANA): NEGATIVE

## 2023-02-15 LAB — IMMUNOGLOBULINS A/E/G/M, SERUM: IgM (Immunoglobulin M), Srm: 43 mg/dL (ref 20–172)

## 2023-02-15 LAB — IRON,TIBC AND FERRITIN PANEL: Iron: 123 ug/dL (ref 38–169)

## 2023-02-15 LAB — CERULOPLASMIN: Ceruloplasmin: 30.2 mg/dL (ref 16.0–31.0)

## 2023-02-18 LAB — CBC WITH DIFFERENTIAL
Basos: 1 %
EOS (ABSOLUTE): 0.1 10*3/uL (ref 0.0–0.4)
Eos: 1 %
Hematocrit: 34.1 % — ABNORMAL LOW (ref 37.5–51.0)
Hemoglobin: 11.8 g/dL — ABNORMAL LOW (ref 13.0–17.7)
Immature Grans (Abs): 0 10*3/uL (ref 0.0–0.1)
Lymphocytes Absolute: 2.6 10*3/uL (ref 0.7–3.1)
Lymphs: 41 %
MCHC: 34.6 g/dL (ref 31.5–35.7)
Monocytes: 8 %
Neutrophils Absolute: 3.2 10*3/uL (ref 1.4–7.0)
RBC: 3.53 x10E6/uL — ABNORMAL LOW (ref 4.14–5.80)
RDW: 11.5 % — ABNORMAL LOW (ref 11.6–15.4)

## 2023-02-18 LAB — MITOCHONDRIAL/SMOOTH MUSCLE AB PNL
Mitochondrial Ab: <20 Units (ref 0.0–20.0)
Smooth Muscle Ab: 3 Units (ref 0–19)

## 2023-02-18 LAB — IRON,TIBC AND FERRITIN PANEL
Ferritin: 506 ng/mL — ABNORMAL HIGH (ref 30–400)
Iron Saturation: 38 % (ref 15–55)
Total Iron Binding Capacity: 322 ug/dL (ref 250–450)
UIBC: 199 ug/dL (ref 111–343)

## 2023-02-18 LAB — HEPATITIS B SURFACE ANTIBODY,QUALITATIVE: Hep B Surface Ab, Qual: NONREACTIVE

## 2023-02-18 LAB — IMMUNOGLOBULINS A/E/G/M, SERUM
IgA/Immunoglobulin A, Serum: 274 mg/dL (ref 90–386)
IgG (Immunoglobin G), Serum: 1520 mg/dL (ref 603–1613)

## 2023-02-18 LAB — HEPATITIS B E ANTIBODY: Hep B E Ab: NONREACTIVE

## 2023-02-18 LAB — HEPATITIS C ANTIBODY: Hep C Virus Ab: NONREACTIVE

## 2023-02-18 LAB — HEPATITIS A ANTIBODY, TOTAL: hep A Total Ab: NEGATIVE

## 2023-02-18 LAB — HEPATITIS B SURFACE ANTIGEN: Hepatitis B Surface Ag: NEGATIVE

## 2023-02-18 LAB — HEPATITIS B E ANTIGEN: Hep B E Ag: NEGATIVE

## 2023-02-18 LAB — HEPATITIS B CORE ANTIBODY, TOTAL: Hep B Core Total Ab: NEGATIVE

## 2023-02-21 ENCOUNTER — Telehealth: Payer: Self-pay

## 2023-02-21 ENCOUNTER — Ambulatory Visit
Admission: RE | Admit: 2023-02-21 | Discharge: 2023-02-21 | Disposition: A | Discharge status: Home / Self Care | Payer: Self-pay | Source: Ambulatory Visit | Attending: Physician Assistant | Admitting: Physician Assistant

## 2023-02-21 DIAGNOSIS — R748 Abnormal levels of other serum enzymes: Secondary | ICD-10-CM | POA: Insufficient documentation

## 2023-02-21 NOTE — Telephone Encounter (Signed)
Patient notified.   Call and notify patient RUQ ultrasound shows hepatic steatosis / fatty liver disease.  There are no gallstones.  No liver masses.  Continue with current plan.  Recommend low-fat diet, regular exercise, and weight loss.

## 2023-02-25 ENCOUNTER — Telehealth: Payer: Self-pay

## 2023-02-25 ENCOUNTER — Other Ambulatory Visit: Payer: Self-pay

## 2023-02-25 NOTE — Telephone Encounter (Signed)
   Tried leaving message -mailbox not set up- has patient seen results-        [name], Gary has not viewed the following results:  - HEPATITIS A ANTIBODY, TOTAL  - HEPATITIS B CORE ANTIBODY, TOTAL  - HEPATITIS B E ANTIGEN  - HEPATITIS B E ANTIBODY  - HEPATITIS B SURFACE ANTIBODY,QUALITATIVE  - HEPATITIS B SURFACE ANTIGEN  - HEPATITIS C ANTIBODY  - ANA  - MITOCHONDRIAL/SMOOTH MUSCLE AB PNL  - CERULOPLASMIN  - IRON,TIBC AND FERRITIN PANEL  - IMMUNOGLOBULINS A/E/G/M, SERUM  - ALPHA-1-ANTITRYPSIN  - ANTI-MICROSOMAL ANTIBODY LIVER / KIDNEY  - CBC WITH DIFFERENTIAL

## 2023-03-05 ENCOUNTER — Other Ambulatory Visit: Payer: Self-pay

## 2023-03-05 ENCOUNTER — Other Ambulatory Visit: Payer: Self-pay | Admitting: Gerontology

## 2023-03-05 DIAGNOSIS — G40209 Localization-related (focal) (partial) symptomatic epilepsy and epileptic syndromes with complex partial seizures, not intractable, without status epilepticus: Secondary | ICD-10-CM

## 2023-03-05 MED FILL — Levetiracetam Tab 1000 MG: ORAL | 30 days supply | Qty: 120 | Fill #0 | Status: AC

## 2023-03-06 ENCOUNTER — Ambulatory Visit: Payer: Self-pay | Admitting: Gerontology

## 2023-03-07 ENCOUNTER — Encounter: Payer: Self-pay | Admitting: Gerontology

## 2023-03-07 ENCOUNTER — Other Ambulatory Visit: Payer: Self-pay

## 2023-03-07 ENCOUNTER — Ambulatory Visit: Payer: Self-pay | Admitting: Gerontology

## 2023-03-07 VITALS — BP 136/81 | HR 82 | Temp 98.1°F | Resp 16 | Ht 70.0 in | Wt 216.8 lb

## 2023-03-07 DIAGNOSIS — R7303 Prediabetes: Secondary | ICD-10-CM

## 2023-03-07 DIAGNOSIS — G40209 Localization-related (focal) (partial) symptomatic epilepsy and epileptic syndromes with complex partial seizures, not intractable, without status epilepticus: Secondary | ICD-10-CM

## 2023-03-07 DIAGNOSIS — I10 Essential (primary) hypertension: Secondary | ICD-10-CM

## 2023-03-07 DIAGNOSIS — E782 Mixed hyperlipidemia: Secondary | ICD-10-CM

## 2023-03-07 DIAGNOSIS — K219 Gastro-esophageal reflux disease without esophagitis: Secondary | ICD-10-CM

## 2023-03-07 LAB — POCT GLYCOSYLATED HEMOGLOBIN (HGB A1C): Hemoglobin A1C: 5.5 % (ref 4.0–5.6)

## 2023-03-07 LAB — GLUCOSE, POCT (MANUAL RESULT ENTRY): POC Glucose: 135 mg/dl — AB (ref 70–99)

## 2023-03-07 MED ORDER — PRAVASTATIN SODIUM 40 MG PO TABS
40.0000 mg | ORAL_TABLET | Freq: Every day | ORAL | 1 refills | Status: DC
Start: 2023-03-07 — End: 2023-04-25
  Filled 2023-03-07: qty 90, 90d supply, fill #0

## 2023-03-07 MED ORDER — LEVETIRACETAM 1000 MG PO TABS
1000.0000 mg | ORAL_TABLET | Freq: Two times a day (BID) | ORAL | 1 refills | Status: DC
Start: 2023-03-07 — End: 2023-06-20
  Filled 2023-03-07 – 2023-05-03 (×2): qty 60, 30d supply, fill #0
  Filled 2023-05-29: qty 60, 30d supply, fill #1

## 2023-03-07 MED ORDER — PANTOPRAZOLE SODIUM 40 MG PO TBEC
40.0000 mg | DELAYED_RELEASE_TABLET | Freq: Every day | ORAL | 1 refills | Status: DC
Start: 2023-03-07 — End: 2023-06-20
  Filled 2023-03-07: qty 90, 90d supply, fill #0
  Filled 2023-05-29: qty 90, 90d supply, fill #1

## 2023-03-07 MED ORDER — LOSARTAN POTASSIUM 100 MG PO TABS
100.0000 mg | ORAL_TABLET | Freq: Every morning | ORAL | 1 refills | Status: AC
Start: 2023-03-07 — End: ?
  Filled 2023-03-07 – 2023-05-29 (×3): qty 90, 90d supply, fill #0

## 2023-03-07 NOTE — Patient Instructions (Signed)
Alcohol Use Disorder Alcohol use disorder is a condition in which drinking disrupts daily life. People with this condition drink too much alcohol and cannot control their drinking. Alcohol use disorder can cause serious problems with physical health. It can affect the brain, heart, and other internal organs. This disorder can raise the risk for certain cancers and cause problems with mental health, such as depression or anxiety. What are the causes? This condition is caused by drinking too much alcohol over time. Some people with this condition drink to cope with or escape from negative life events. Others drink to relieve symptoms of physical pain or symptoms of mental illness. What increases the risk? You are more likely to develop this condition if: You have a family history of alcohol use disorder. Your culture encourages drinking to the point of becoming drunk (intoxication). You had a mood or conduct disorder in childhood. You have been abused. You are an adolescent and you: Have poor performance in school. Have poor supervision or guidance. Act on impulse and like taking risks. What are the signs or symptoms? Symptoms of this condition include: Drinking more than you want to. Trying several times without success to drink less. Spending a lot of time thinking about alcohol, getting alcohol, drinking alcohol, or recovering from drinking alcohol. Continuing to drink even when it is causing serious problems in your daily life. Drinking when it is dangerous to drink, such as before driving a car. Needing more and more alcohol to get the same effect you want (building up tolerance). Having symptoms of withdrawal when you stop drinking. Withdrawal symptoms may include: Trouble sleeping, leading to tiredness (fatigue). Mood swings of depression and anxiety. Physical symptoms, such as a fast heart rate, rapid breathing, high blood pressure (hypertension), fever, cold sweats, or  nausea. Seizures. Severe confusion. Feeling or seeing things that are not there (hallucinations). Shaking movements that you cannot control (tremors). How is this diagnosed? This condition is diagnosed with an assessment. Your health care provider may start by asking three or four questions about your drinking, or they may give you a simple test to take. This helps to get clear information from you. You may also have a physical exam or lab tests. You may be referred to a substance abuse counselor. How is this treated? With education, some people with alcohol use disorder are able to reduce their drinking. Many with this disorder cannot change their drinking behavior on their own and need help with treatment from substance use specialists. Treatments may include: Detoxification. Detoxification involves quitting drinking with supervision and direction of health care providers. Your health care provider may prescribe medicines within the first week to help lessen withdrawal symptoms. Alcohol withdrawal can be dangerous and life-threatening. Detoxification may be provided in a home, community, or primary care setting, or in a hospital or substance use treatment facility. Counseling. This may involve motivational interviewing (MI), family therapy, or cognitive behavioral therapy (CBT). A counselor can address the things you can do to change your drinking behavior and how to maintain the changes. Talk therapy aims to: Identify your positive motivations to change. Identify and avoid the things that trigger your drinking. Help you learn how to plan your behavior change. Develop support systems that can help you sustain the change. Medicines. Medicines can help treat this disorder by: Decreasing cravings. Decreasing the positive feeling you have when you drink. Causing an uncomfortable physical reaction when you drink (aversion therapy). Support groups such as Alcoholics Anonymous (AA). These groups are  led by people who have quit drinking. The groups provide emotional support, advice, and guidance. Some people with this condition benefit from a combination of treatments provided by specialized substance use treatment centers. Follow these instructions at home:  Medicines Take over-the-counter and prescription medicines only as told by your health care provider. Ask before starting any new medicines, herbs, or supplements. General instructions Ask friends and family members to support your choice to stay sober. Avoid places where alcohol is served. Create a plan to deal with tempting situations. Attend support groups regularly. Practice hobbies or activities you enjoy. Do not drink and drive. How is this prevented? Do not drink alcohol if your health care provider tells you not to drink. If you drink alcohol: Limit how much you have to: 0-1 drink a day for women who are not pregnant. 0-2 drinks a day for men. Know how much alcohol is in your drink. In the [place] one drink equals one 12 oz bottle of beer (355 mL), one 5 oz glass of wine (148 mL), or one 1 oz glass of hard liquor (44 mL). If you have a mental health condition, seek treatment. Develop a healthy lifestyle through: Meditation or deep breathing. Exercise. Spending time in nature. Listening to music. Talking with a trusted friend or family member. If you are a teen: Do not drink alcohol. Avoid gatherings where you might be tempted to drink alcohol. Do not be afraid to say no if someone offers you alcohol. Speak up about why you do not want to drink. Set a positive example for others around you by not drinking. Build relationships with friends who do not drink. Where to find more information Substance Abuse and Mental Health Services Administration: RockToxic.pl Alcoholics Anonymous: CustomizedRugs.fi Contact a health care provider if: You cannot take your medicines as told. Your symptoms get worse or you experience symptoms of  withdrawal when you stop drinking. You start drinking again (relapse) and your symptoms get worse. Get help right away if: You have thoughts about hurting yourself or others. Get help right away if you feel like you may hurt yourself or others, or have thoughts about taking your own life. Go to your nearest emergency room or: Call 911. Call the National Suicide Prevention Lifeline at 914-074-2479 or 988. This is open 24 hours a day. Text the Crisis Text Line at 825 682 3811. Summary Alcohol use disorder is a condition in which drinking disrupts daily life. People with this condition drink too much alcohol and cannot control their drinking. Treatment may include detoxification, counseling, medicines, and support groups. Ask friends and family members to support you. Avoid situations where alcohol is served. Get help right away if you have thoughts about hurting yourself or others. This information is not intended to replace advice given to you by your health care provider. Make sure you discuss any questions you have with your health care provider. Document Revised: 11/29/2021 Document Reviewed: 11/29/2021 Elsevier Patient Education  2024 Elsevier Inc. DASH Eating Plan DASH stands for Dietary Approaches to Stop Hypertension. The DASH eating plan is a healthy eating plan that has been shown to: Lower high blood pressure (hypertension). Reduce your risk for type 2 diabetes, heart disease, and stroke. Help with weight loss. What are tips for following this plan? Reading food labels Check food labels for the amount of salt (sodium) per serving. Choose foods with less than 5 percent of the Daily Value (DV) of sodium. In general, foods with less than 300 milligrams (mg) of sodium  per serving fit into this eating plan. To find whole grains, look for the word "whole" as the first word in the ingredient list. Shopping Buy products labeled as "low-sodium" or "no salt added." Buy fresh foods. Avoid  canned foods and pre-made or frozen meals. Cooking Try not to add salt when you cook. Use salt-free seasonings or herbs instead of table salt or sea salt. Check with your health care provider or pharmacist before using salt substitutes. Do not fry foods. Cook foods in healthy ways, such as baking, boiling, grilling, roasting, or broiling. Cook using oils that are good for your heart. These include olive, canola, avocado, soybean, and sunflower oil. Meal planning  Eat a balanced diet. This should include: 4 or more servings of fruits and 4 or more servings of vegetables each day. Try to fill half of your plate with fruits and vegetables. 6-8 servings of whole grains each day. 6 or less servings of lean meat, poultry, or fish each day. 1 oz is 1 serving. A 3 oz (85 g) serving of meat is about the same size as the palm of your hand. One egg is 1 oz (28 g). 2-3 servings of low-fat dairy each day. One serving is 1 cup (237 mL). 1 serving of nuts, seeds, or beans 5 times each week. 2-3 servings of heart-healthy fats. Healthy fats called omega-3 fatty acids are found in foods such as walnuts, flaxseeds, fortified milks, and eggs. These fats are also found in cold-water fish, such as sardines, salmon, and mackerel. Limit how much you eat of: Canned or prepackaged foods. Food that is high in trans fat, such as fried foods. Food that is high in saturated fat, such as fatty meat. Desserts and other sweets, sugary drinks, and other foods with added sugar. Full-fat dairy products. Do not salt foods before eating. Do not eat more than 4 egg yolks a week. Try to eat at least 2 vegetarian meals a week. Eat more home-cooked food and less restaurant, buffet, and fast food. Lifestyle When eating at a restaurant, ask if your food can be made with less salt or no salt. If you drink alcohol: Limit how much you have to: 0-1 drink a day if you are male. 0-2 drinks a day if you are male. Know how much alcohol  is in your drink. In the [place] one drink is one 12 oz bottle of beer (355 mL), one 5 oz glass of wine (148 mL), or one 1 oz glass of hard liquor (44 mL). General information Avoid eating more than 2,300 mg of salt a day. If you have hypertension, you may need to reduce your sodium intake to 1,500 mg a day. Work with your provider to stay at a healthy body weight or lose weight. Ask what the best weight range is for you. On most days of the week, get at least 30 minutes of exercise that causes your heart to beat faster. This may include walking, swimming, or biking. Work with your provider or dietitian to adjust your eating plan to meet your specific calorie needs. What foods should I eat? Fruits All fresh, dried, or frozen fruit. Canned fruits that are in their natural juice and do not have sugar added to them. Vegetables Fresh or frozen vegetables that are raw, steamed, roasted, or grilled. Low-sodium or reduced-sodium tomato and vegetable juice. Low-sodium or reduced-sodium tomato sauce and tomato paste. Low-sodium or reduced-sodium canned vegetables. Grains Whole-grain or whole-wheat bread. Whole-grain or whole-wheat pasta. Brown rice. Oatmeal.  Quinoa. Bulgur. Whole-grain and low-sodium cereals. Pita bread. Low-fat, low-sodium crackers. Whole-wheat flour tortillas. Meats and other proteins Skinless chicken or [place]. Ground chicken or [place]. Pork with fat trimmed off. Fish and seafood. Egg whites. Dried beans, peas, or lentils. Unsalted nuts, nut butters, and seeds. Unsalted canned beans. Lean cuts of beef with fat trimmed off. Low-sodium, lean precooked or cured meat, such as sausages or meat loaves. Dairy Low-fat (1%) or fat-free (skim) milk. Reduced-fat, low-fat, or fat-free cheeses. Nonfat, low-sodium ricotta or cottage cheese. Low-fat or nonfat yogurt. Low-fat, low-sodium cheese. Fats and oils Soft margarine without trans fats. Vegetable oil. Reduced-fat, low-fat, or light mayonnaise and  salad dressings (reduced-sodium). Canola, safflower, olive, avocado, soybean, and sunflower oils. Avocado. Seasonings and condiments Herbs. Spices. Seasoning mixes without salt. Other foods Unsalted popcorn and pretzels. Fat-free sweets. The items listed above may not be all the foods and drinks you can have. Talk to a dietitian to learn more. What foods should I avoid? Fruits Canned fruit in a light or heavy syrup. Fried fruit. Fruit in cream or butter sauce. Vegetables Creamed or fried vegetables. Vegetables in a cheese sauce. Regular canned vegetables that are not marked as low-sodium or reduced-sodium. Regular canned tomato sauce and paste that are not marked as low-sodium or reduced-sodium. Regular tomato and vegetable juices that are not marked as low-sodium or reduced-sodium. Rosita Fire. Olives. Grains Baked goods made with fat, such as croissants, muffins, or some breads. Dry pasta or rice meal packs. Meats and other proteins Fatty cuts of meat. Ribs. Fried meat. Tomasa Blase. Bologna, salami, and other precooked or cured meats, such as sausages or meat loaves, that are not lean and low in sodium. Fat from the back of a pig (fatback). Bratwurst. Salted nuts and seeds. Canned beans with added salt. Canned or smoked fish. Whole eggs or egg yolks. Chicken or [place] with skin. Dairy Whole or 2% milk, cream, and half-and-half. Whole or full-fat cream cheese. Whole-fat or sweetened yogurt. Full-fat cheese. Nondairy creamers. Whipped toppings. Processed cheese and cheese spreads. Fats and oils Butter. Stick margarine. Lard. Shortening. Ghee. Bacon fat. Tropical oils, such as coconut, palm kernel, or palm oil. Seasonings and condiments Onion salt, garlic salt, seasoned salt, table salt, and sea salt. Worcestershire sauce. Tartar sauce. Barbecue sauce. Teriyaki sauce. Soy sauce, including reduced-sodium soy sauce. Steak sauce. Canned and packaged gravies. Fish sauce. Oyster sauce. Cocktail sauce.  Store-bought horseradish. Ketchup. Mustard. Meat flavorings and tenderizers. Bouillon cubes. Hot sauces. Pre-made or packaged marinades. Pre-made or packaged taco seasonings. Relishes. Regular salad dressings. Other foods Salted popcorn and pretzels. The items listed above may not be all the foods and drinks you should avoid. Talk to a dietitian to learn more. Where to find more information National Heart, Lung, and Blood Institute (NHLBI): BuffaloDryCleaner.gl American Heart Association (AHA): heart.org Academy of Nutrition and Dietetics: eatright.org National Kidney Foundation (NKF): kidney.org This information is not intended to replace advice given to you by your health care provider. Make sure you discuss any questions you have with your health care provider. Document Revised: 10/11/2022 Document Reviewed: 10/11/2022 Elsevier Patient Education  2024 [employer].

## 2023-03-07 NOTE — Progress Notes (Signed)
Established Patient Office Visit  Subjective   Patient ID: Gary [name], male    DOB: Aug 12, 1971  Age: 52 y.o. MRN: [id number]  Chief Complaint  Patient presents with   Follow-up    Patient saw GI on 02/14/23 and has had labs and ultrasound to evaluate elevated LFTs    HPI   Gary [name] is a a 52 y/o with a history of HTN, hyperlipidemia, seizure disorder, CVA, TIA, alcohol use, tobacco use, and GERD who presents for follow up and medication refill . He was seen at the Gastroenterology clinic for elevated liver enzymes on 02/14/23 by Gerre Pebbles T PA-C,liver ultrasound was done and it showed  Diffuse increased echogenicity throughout the liver is nonspecific and may be seen with hepatic steatosis or other underlying intrinsic liver disease. He states that he is cutting back on his alcohol consumption. He was started on Protonix 40 mg during last visit and he states that symptoms has improved. Overall, he states that he's doing well and offers no further complaint.   Review of Systems  Constitutional: Negative.   Eyes: Negative.   Respiratory: Negative.    Cardiovascular: Negative.   Gastrointestinal: Negative.   Neurological: Negative.   Endo/Heme/Allergies: Negative.   Psychiatric/Behavioral: Negative.        Objective:     BP 136/81 (BP Location: Right Arm, Patient Position: Sitting, Cuff Size: Large)   Pulse 82   Temp 98.1 F (36.7 C) (Oral)   Resp 16   Ht 5\' 10"  (1.778 m)   Wt 216 lb 12.8 oz (98.3 kg)   SpO2 95%   BMI 31.11 kg/m  BP Readings from Last 3 Encounters:  03/07/23 136/81  02/14/23 (!) 175/101  02/06/23 (!) 151/66   Wt Readings from Last 3 Encounters:  03/07/23 216 lb 12.8 oz (98.3 kg)  02/14/23 216 lb 12.8 oz (98.3 kg)  02/06/23 214 lb (97.1 kg)      Physical Exam HENT:     Head: Normocephalic and atraumatic.     Mouth/Throat:     Mouth: Mucous membranes are moist.  Eyes:     Extraocular Movements: Extraocular movements intact.      Conjunctiva/sclera: Conjunctivae normal.     Pupils: Pupils are equal, round, and reactive to light.  Cardiovascular:     Rate and Rhythm: Normal rate and regular rhythm.     Pulses: Normal pulses.     Heart sounds: Normal heart sounds.  Pulmonary:     Effort: Pulmonary effort is normal.     Breath sounds: Normal breath sounds.  Abdominal:     General: Bowel sounds are normal. There is no distension.     Palpations: Abdomen is soft.     Tenderness: There is no abdominal tenderness.  Skin:    General: Skin is warm.     Coloration: Skin is not jaundiced.  Neurological:     General: No focal deficit present.     Mental Status: He is alert and oriented to person, place, and time. Mental status is at baseline.  Psychiatric:        Mood and Affect: Mood normal.        Behavior: Behavior normal.        Thought Content: Thought content normal.        Judgment: Judgment normal.      Results for orders placed or performed in visit on 03/07/23  POCT Glucose (CBG)  Result Value Ref Range   POC Glucose 135 (A) 70 - 99  mg/dl  POCT HgB Q0H  Result Value Ref Range   Hemoglobin A1C 5.5 4.0 - 5.6 %   HbA1c POC (<> result, manual entry)     HbA1c, POC (prediabetic range)     HbA1c, POC (controlled diabetic range)      Last CBC Lab Results  Component Value Date   WBC 6.5 02/14/2023   HGB 11.8 (L) 02/14/2023   HCT 34.1 (L) 02/14/2023   MCV 97 02/14/2023   MCH 33.4 (H) 02/14/2023   RDW 11.5 (L) 02/14/2023   PLT 238 07/31/2022   Last metabolic panel Lab Results  Component Value Date   GLUCOSE 86 01/23/2023   NA 141 01/23/2023   K 4.1 01/23/2023   CL 100 01/23/2023   CO2 21 01/23/2023   BUN 18 01/23/2023   CREATININE 1.57 (H) 01/23/2023   EGFR 53 (L) 01/23/2023   CALCIUM 9.8 01/23/2023   PHOS 3.0 08/16/2022   PROT 7.6 01/23/2023   ALBUMIN 4.6 01/23/2023   LABGLOB 3.0 01/23/2023   AGRATIO 1.5 01/23/2023   BILITOT 0.6 01/23/2023   ALKPHOS 109 01/23/2023   AST 99 (H)  01/23/2023   ALT 76 (H) 01/23/2023   ANIONGAP 12 07/31/2022   Last lipids Lab Results  Component Value Date   CHOL 243 (H) 01/23/2023   HDL 47 01/23/2023   LDLCALC 175 (H) 01/23/2023   TRIG 115 01/23/2023   CHOLHDL 5.2 (H) 01/23/2023   Last hemoglobin A1c Lab Results  Component Value Date   HGBA1C 5.5 03/07/2023   Last thyroid functions Lab Results  Component Value Date   TSH 1.400 02/02/2021   Last vitamin D No results found for: "25OHVITD2", "25OHVITD3", "VD25OH" Last vitamin B12 and Folate Lab Results  Component Value Date   VITAMINB12 447 12/29/2020   FOLATE 5.2 12/03/2018      The ASCVD Risk score (Arnett DK, et al., 2019) failed to calculate for the following reasons:   The patient has a prior MI or stroke diagnosis    Assessment & Plan:   1. Localization-related (focal) (partial) symptomatic epilepsy and epileptic syndromes with complex partial seizures, not intractable, without status epilepticus (HCC) - He will continue on current medication and will check Keppra level. - levETIRAcetam (KEPPRA) 1000 MG tablet; Take 1 tablet (1,000 mg total) by mouth 2 (two) times daily.  Dispense: 60 tablet; Refill: 1 - Levetiracetam level  2. Essential hypertension - His blood pressure is under control, will continue current medication, DASH diet and exercise as tolerated. - losartan (COZAAR) 100 MG tablet; Take 1 tablet (100 mg total) by mouth in the morning.  Dispense: 90 tablet; Refill: 1 - Comp Met (CMET); Future  3. Mixed hyperlipidemia - He will continue current medication, low fat/cholesterol diet and exercise as tolerated. - pravastatin (PRAVACHOL) 40 MG tablet; Take 1 tablet (40 mg total) by mouth daily.  Dispense: 90 tablet; Refill: 1 - Lipid panel; Future  4. Gastroesophageal reflux disease, unspecified whether esophagitis present - His acid reflux is under control, will continue current medication. -Avoid spicy, fatty and fried food -Avoid sodas and sour  juices -Avoid heavy meals -Avoid eating 4 hours before bedtime -Elevate head of bed at night - pantoprazole (PROTONIX) 40 MG tablet; Take 1 tablet (40 mg total) by mouth daily.  Dispense: 90 tablet; Refill: 1  5. Prediabetes - His HgbA1c was 5.6 mg/dl, he was encouraged to continue on low crb/non concentrated sweet diet and exercise as tolerated. - POCT HgB A1C; Future - POCT Glucose (CBG);  Future - POCT Glucose (CBG) - POCT HgB A1C   Return in about 8 weeks (around 05/02/2023), or if symptoms worsen or fail to improve.    Adalis Gatti Trellis Paganini, NP

## 2023-04-18 ENCOUNTER — Ambulatory Visit: Payer: Self-pay | Admitting: Cardiology

## 2023-04-23 ENCOUNTER — Other Ambulatory Visit: Payer: Self-pay

## 2023-04-24 ENCOUNTER — Other Ambulatory Visit: Payer: Self-pay

## 2023-04-25 ENCOUNTER — Encounter: Payer: Self-pay | Admitting: Cardiology

## 2023-04-25 ENCOUNTER — Other Ambulatory Visit: Payer: Self-pay

## 2023-04-25 ENCOUNTER — Ambulatory Visit: Payer: Self-pay | Attending: Cardiology | Admitting: Cardiology

## 2023-04-25 VITALS — BP 156/90 | HR 68 | Ht 70.0 in | Wt 217.4 lb

## 2023-04-25 DIAGNOSIS — F17200 Nicotine dependence, unspecified, uncomplicated: Secondary | ICD-10-CM

## 2023-04-25 DIAGNOSIS — E7800 Pure hypercholesterolemia, unspecified: Secondary | ICD-10-CM

## 2023-04-25 DIAGNOSIS — I10 Essential (primary) hypertension: Secondary | ICD-10-CM

## 2023-04-25 MED ORDER — ROSUVASTATIN CALCIUM 40 MG PO TABS
40.0000 mg | ORAL_TABLET | Freq: Every day | ORAL | 3 refills | Status: DC
  Filled 2023-04-25: qty 90, 90d supply, fill #0

## 2023-04-25 NOTE — Patient Instructions (Signed)
Medication Instructions:   STOP pravastatin (PRAVACHOL) 40 MG tablet  START Rosuvastatin - Take one tablet (40mg ) by mouth daily.   *If you need a refill on your cardiac medications before your next appointment, please call your pharmacy*   Lab Work:  Your physician recommends that you return for lab work before your follow up appt at the medical mall. You will need to be fasting. No appt is needed. Hours are M-F 7AM- 6 PM.  If you have labs (blood work) drawn today and your tests are completely normal, you will receive your results only by: MyChart Message (if you have MyChart) OR A paper copy in the mail If you have any lab test that is abnormal or we need to change your treatment, we will call you to review the results.   Testing/Procedures:  None Ordered   Follow-Up: At [facility], you and your health needs are our priority.  As part of our continuing mission to provide you with exceptional heart care, we have created designated Provider Care Teams.  These Care Teams include your primary Cardiologist (physician) and Advanced Practice Providers (APPs -  Physician Assistants and Nurse Practitioners) who all work together to provide you with the care you need, when you need it.  We recommend signing up for the patient portal called "MyChart".  Sign up information is provided on this After Visit Summary.  MyChart is used to connect with patients for Virtual Visits (Telemedicine).  Patients are able to view lab/test results, encounter notes, upcoming appointments, etc.  Non-urgent messages can be sent to your provider as well.   To learn more about what you can do with MyChart, go to [email or web address].    Your next appointment:   2 month(s)  Provider:   You may see Debbe Odea, MD or one of the following Advanced Practice Providers on your designated Care Team:   Nicolasa Ducking, NP Eula Listen, PA-C Cadence Fransico Michael, PA-C Charlsie Quest, NP

## 2023-04-25 NOTE — Progress Notes (Signed)
Cardiology Office Note:    Date:  04/25/2023   ID:  Gary [name], DOB [birth year], MRN [id number]  PCP:  Gary Bellow, NP   [facility] Providers Cardiologist:  Gary Odea, MD     Referring MD: Gary Bellow, NP   Chief Complaint  Patient presents with   New Patient (Initial Visit)    Referred for mixed hyperlipidemia evaluation.  No cardiac history.      History of Present Illness:    Gary [name] is a 52 y.o. male with a hx of hypertension, hyperlipidemia, TIA 12/2020, current smoker x 40+ years who presents due to hyperlipidemia.  States having a history of hyperlipidemia for some time now.  Has had several strokes/TIAs.  Last TIA was 4 months ago.  Currently smokes, denies palpitations, chest pain or shortness of breath.  Mother had a heart attack in her 69s last week.  Blood pressures at home are adequately controlled usually systolics in the 130s.  Echocardiogram 10/23 EF 65 to 70%  Past Medical History:  Diagnosis Date   Alcohol use    Carotid artery occlusion    GERD (gastroesophageal reflux disease)    Hypercholesteremia    Hypertension    Seizures (HCC)    Stroke (HCC)    TIA 12/29/20, re-admitted 01/14/21 for sequelae   Tobacco abuse     Past Surgical History:  Procedure Laterality Date   NO PAST SURGERIES      Current Medications: Current Meds  Medication Sig   amLODipine (NORVASC) 10 MG tablet Take 1 tablet (10 mg total) by mouth every evening.   aspirin 81 MG tablet Take 1 tablet (81 mg total) by mouth daily.   carvedilol (COREG) 25 MG tablet Take 1 tablet (25 mg total) by mouth 2 (two) times daily with a meal.   clopidogrel (PLAVIX) 75 MG tablet Take 1 tablet (75 mg total) by mouth daily.   folic acid (FOLVITE) 1 MG tablet Take 1 tablet (1 mg total) by mouth daily.   ibuprofen (ADVIL) 200 MG tablet Take 800 mg by mouth daily as needed for headache or moderate pain (usually takes daily for headaches, leg pain in am).    levETIRAcetam (KEPPRA) 1000 MG tablet Take 1 tablet (1,000 mg total) by mouth 2 (two) times daily.   losartan (COZAAR) 100 MG tablet Take 1 tablet (100 mg total) by mouth in the morning.   pantoprazole (PROTONIX) 40 MG tablet Take 1 tablet (40 mg total) by mouth daily.   rosuvastatin (CRESTOR) 40 MG tablet Take 1 tablet (40 mg total) by mouth daily.   sildenafil (VIAGRA) 100 MG tablet Take 1 tablet 1 hour prior to intercourse.   thiamine (VITAMIN B-1) 100 MG tablet Take 1 tablet (100 mg total) by mouth daily.   [DISCONTINUED] pravastatin (PRAVACHOL) 40 MG tablet Take 1 tablet (40 mg total) by mouth daily.     Allergies:   Patient has no known allergies.   Social History   Socioeconomic History   Marital status: Divorced    Spouse name: Not on file   Number of children: 4   Years of education: Not on file   Highest education level: High school graduate  Occupational History   Not on file  Tobacco Use   Smoking status: Every Day    Current packs/day: 0.25    Average packs/day: 0.3 packs/day for 34.0 years (8.5 ttl pk-yrs)    Types: Cigarettes   Smokeless tobacco: Never   Tobacco comments:  Pt reports he has thoughts of quitting, has cut back to 5 cigarettes per day  Vaping Use   Vaping status: Never Used  Substance and Sexual Activity   Alcohol use: Yes    Alcohol/week: 21.0 standard drinks of alcohol    Types: 21 Standard drinks or equivalent per week    Comment: 3-4 mixed drinks daily, 03/07/23 patient states down to 2.5 drinks per day   Drug use: No   Sexual activity: Yes    Birth control/protection: None  Other Topics Concern   Not on file  Social History Narrative   Lives at home with his aunt   Right handed   Drinks no caffeine   Social Determinants of Health   Financial Resource Strain: Not on file  Food Insecurity: No Food Insecurity (07/31/2022)   Hunger Vital Sign    Worried About Running Out of Food in the Last Year: Never true    Ran Out of Food in the  Last Year: Never true  Transportation Needs: No Transportation Needs (07/31/2022)   PRAPARE - Administrator, Civil Service (Medical): No    Lack of Transportation (Non-Medical): No  Physical Activity: Not on file  Stress: Not on file  Social Connections: Not on file     Family History: The patient's family history includes Healthy in his sister; Heart attack in his mother; Hypertension in his mother; Other in his father, maternal grandmother, paternal grandfather, and paternal grandmother; Other (age of onset: 17) in his maternal grandfather.  ROS:   Please see the history of present illness.     All other systems reviewed and are negative.  EKGs/Labs/Other Studies Reviewed:    The following studies were reviewed today:  EKG Interpretation Date/Time:  Thursday April 25 2023 11:15:19 EDT Ventricular Rate:  68 PR Interval:  180 QRS Duration:  82 QT Interval:  400 QTC Calculation: 425 R Axis:   32  Text Interpretation: Normal sinus rhythm Normal ECG Confirmed by Gary [name] ([id number]) on 04/25/2023 11:39:48 AM    Recent Labs: 07/31/2022: Platelets 238 08/16/2022: Magnesium 2.2 01/23/2023: ALT 76; BUN 18; Creatinine, Ser 1.57; Potassium 4.1; Sodium 141 02/14/2023: Hemoglobin 11.8  Recent Lipid Panel    Component Value Date/Time   CHOL 243 (H) 01/23/2023 1443   TRIG 115 01/23/2023 1443   HDL 47 01/23/2023 1443   CHOLHDL 5.2 (H) 01/23/2023 1443   CHOLHDL 8.6 07/31/2022 1528   VLDL 22 07/31/2022 1528   LDLCALC 175 (H) 01/23/2023 1443     Risk Assessment/Calculations:     HYPERTENSION CONTROL Vitals:   04/25/23 1108 04/25/23 1116  BP: 138/86 (!) 156/90    The patient's blood pressure is elevated above target today.  In order to address the patient's elevated BP: Blood pressure will be monitored at home to determine if medication changes need to be made.            Physical Exam:    VS:  BP (!) 156/90 (BP Location: Left Arm, Patient Position:  Sitting, Cuff Size: Large)   Pulse 68   Ht 5\' 10"  (1.778 m)   Wt 217 lb 6.4 oz (98.6 kg)   SpO2 96%   BMI 31.19 kg/m     Wt Readings from Last 3 Encounters:  04/25/23 217 lb 6.4 oz (98.6 kg)  03/07/23 216 lb 12.8 oz (98.3 kg)  02/14/23 216 lb 12.8 oz (98.3 kg)     GEN:  Well nourished, well developed in no acute distress HEENT:  Normal NECK: No JVD; No carotid bruits CARDIAC: RRR, no murmurs, rubs, gallops RESPIRATORY:  Clear to auscultation without rales, wheezing or rhonchi  ABDOMEN: Soft, non-tender, non-distended MUSCULOSKELETAL:  No edema; No deformity  SKIN: Warm and dry NEUROLOGIC:  Alert and oriented x 3 PSYCHIATRIC:  Normal affect   ASSESSMENT:    1. Pure hypercholesterolemia   2. Primary hypertension   3. Smoking    PLAN:    In order of problems listed above:  Hyperlipidemia, cholesterol not controlled, history of strokes.  Stop Pravachol, start Crestor 40 mg daily.  Recheck lipid panel in 2 months. Hypertension, BP elevated today, usually controlled.  Continue Norvasc 10 mg daily, losartan 100 mg daily. Current smoker, smoking cessation advised.  History of CVA, continue aspirin, Plavix.  Follow-up in 2 to 3 months after lipid profile.      Medication Adjustments/Labs and Tests Ordered: Current medicines are reviewed at length with the patient today.  Concerns regarding medicines are outlined above.  Orders Placed This Encounter  Procedures   Lipid panel   EKG 12-Lead   Meds ordered this encounter  Medications   rosuvastatin (CRESTOR) 40 MG tablet    Sig: Take 1 tablet (40 mg total) by mouth daily.    Dispense:  90 tablet    Refill:  3    Patient Instructions  Medication Instructions:   STOP pravastatin (PRAVACHOL) 40 MG tablet  START Rosuvastatin - Take one tablet (40mg ) by mouth daily.   *If you need a refill on your cardiac medications before your next appointment, please call your pharmacy*   Lab Work:  Your physician recommends that  you return for lab work before your follow up appt at the medical mall. You will need to be fasting. No appt is needed. Hours are M-F 7AM- 6 PM.  If you have labs (blood work) drawn today and your tests are completely normal, you will receive your results only by: MyChart Message (if you have MyChart) OR A paper copy in the mail If you have any lab test that is abnormal or we need to change your treatment, we will call you to review the results.   Testing/Procedures:  None Ordered   Follow-Up: At [facility], you and your health needs are our priority.  As part of our continuing mission to provide you with exceptional heart care, we have created designated Provider Care Teams.  These Care Teams include your primary Cardiologist (physician) and Advanced Practice Providers (APPs -  Physician Assistants and Nurse Practitioners) who all work together to provide you with the care you need, when you need it.  We recommend signing up for the patient portal called "MyChart".  Sign up information is provided on this After Visit Summary.  MyChart is used to connect with patients for Virtual Visits (Telemedicine).  Patients are able to view lab/test results, encounter notes, upcoming appointments, etc.  Non-urgent messages can be sent to your provider as well.   To learn more about what you can do with MyChart, go to [email or web address].    Your next appointment:   2 month(s)  Provider:   You may see Gary Odea, MD or one of the following Advanced Practice Providers on your designated Care Team:   Nicolasa Ducking, NP Eula Listen, PA-C Cadence Fransico Michael, PA-C Charlsie Quest, NP   Signed, Gary Odea, MD  04/25/2023 12:12 PM    [facility]

## 2023-05-02 ENCOUNTER — Ambulatory Visit: Payer: Self-pay | Admitting: Gerontology

## 2023-05-03 ENCOUNTER — Other Ambulatory Visit: Payer: Self-pay

## 2023-05-24 ENCOUNTER — Other Ambulatory Visit: Payer: Self-pay

## 2023-05-29 ENCOUNTER — Other Ambulatory Visit: Payer: Self-pay

## 2023-05-30 ENCOUNTER — Other Ambulatory Visit: Payer: Self-pay

## 2023-06-13 ENCOUNTER — Other Ambulatory Visit: Payer: Self-pay

## 2023-06-13 DIAGNOSIS — I10 Essential (primary) hypertension: Secondary | ICD-10-CM

## 2023-06-13 DIAGNOSIS — E7800 Pure hypercholesterolemia, unspecified: Secondary | ICD-10-CM

## 2023-06-15 LAB — COMPREHENSIVE METABOLIC PANEL
ALT: 48 IU/L — ABNORMAL HIGH (ref 0–44)
AST: 67 IU/L — ABNORMAL HIGH (ref 0–40)
Albumin: 4.4 g/dL (ref 3.8–4.9)
Alkaline Phosphatase: 128 IU/L — ABNORMAL HIGH (ref 44–121)
BUN/Creatinine Ratio: 13 (ref 9–20)
BUN: 21 mg/dL (ref 6–24)
Bilirubin Total: 0.4 mg/dL (ref 0.0–1.2)
CO2: 20 mmol/L (ref 20–29)
Calcium: 9.4 mg/dL (ref 8.7–10.2)
Chloride: 107 mmol/L — ABNORMAL HIGH (ref 96–106)
Creatinine, Ser: 1.61 mg/dL — ABNORMAL HIGH (ref 0.76–1.27)
Globulin, Total: 3.1 g/dL (ref 1.5–4.5)
Glucose: 94 mg/dL (ref 70–99)
Potassium: 3.6 mmol/L (ref 3.5–5.2)
Sodium: 142 mmol/L (ref 134–144)
Total Protein: 7.5 g/dL (ref 6.0–8.5)
eGFR: 51 mL/min/{1.73_m2} — ABNORMAL LOW (ref >59)

## 2023-06-15 LAB — LIPID PANEL
Chol/HDL Ratio: 3.3 ratio (ref 0.0–5.0)
Cholesterol, Total: 127 mg/dL (ref 100–199)
HDL: 39 mg/dL — ABNORMAL LOW (ref >39)
LDL Chol Calc (NIH): 68 mg/dL (ref 0–99)
Triglycerides: 108 mg/dL (ref 0–149)
VLDL Cholesterol Cal: 20 mg/dL (ref 5–40)

## 2023-06-15 LAB — LEVETIRACETAM LEVEL: Levetiracetam Lvl: 18.9 ug/mL (ref 10.0–40.0)

## 2023-06-20 ENCOUNTER — Other Ambulatory Visit: Payer: Self-pay

## 2023-06-20 ENCOUNTER — Encounter: Payer: Self-pay | Admitting: Gerontology

## 2023-06-20 ENCOUNTER — Ambulatory Visit: Payer: Self-pay | Admitting: Gerontology

## 2023-06-20 ENCOUNTER — Ambulatory Visit: Payer: Self-pay | Admitting: Cardiology

## 2023-06-20 VITALS — BP 128/76 | HR 78 | Ht 70.0 in | Wt 213.7 lb

## 2023-06-20 DIAGNOSIS — R7989 Other specified abnormal findings of blood chemistry: Secondary | ICD-10-CM

## 2023-06-20 DIAGNOSIS — K219 Gastro-esophageal reflux disease without esophagitis: Secondary | ICD-10-CM

## 2023-06-20 DIAGNOSIS — R748 Abnormal levels of other serum enzymes: Secondary | ICD-10-CM

## 2023-06-20 DIAGNOSIS — I10 Essential (primary) hypertension: Secondary | ICD-10-CM

## 2023-06-20 DIAGNOSIS — G40209 Localization-related (focal) (partial) symptomatic epilepsy and epileptic syndromes with complex partial seizures, not intractable, without status epilepticus: Secondary | ICD-10-CM

## 2023-06-20 DIAGNOSIS — I639 Cerebral infarction, unspecified: Secondary | ICD-10-CM

## 2023-06-20 MED ORDER — LEVETIRACETAM 1000 MG PO TABS
1000.0000 mg | ORAL_TABLET | Freq: Two times a day (BID) | ORAL | 1 refills | Status: DC
  Filled 2023-06-20: qty 60, 30d supply, fill #0

## 2023-06-20 MED ORDER — AMLODIPINE BESYLATE 10 MG PO TABS
10.0000 mg | ORAL_TABLET | Freq: Every evening | ORAL | 1 refills | Status: AC
Start: 2023-06-20 — End: ?
  Filled 2023-06-20: qty 90, 90d supply, fill #0

## 2023-06-20 MED ORDER — CLOPIDOGREL BISULFATE 75 MG PO TABS
75.0000 mg | ORAL_TABLET | Freq: Every day | ORAL | 0 refills | Status: DC
  Filled 2023-06-20: qty 90, 90d supply, fill #0

## 2023-06-20 MED ORDER — CARVEDILOL 25 MG PO TABS
25.0000 mg | ORAL_TABLET | Freq: Two times a day (BID) | ORAL | 2 refills | Status: DC
  Filled 2023-06-20: qty 180, 90d supply, fill #0

## 2023-06-20 MED ORDER — PANTOPRAZOLE SODIUM 40 MG PO TBEC
40.0000 mg | DELAYED_RELEASE_TABLET | Freq: Every day | ORAL | 1 refills | Status: DC
  Filled 2023-06-20: qty 90, 90d supply, fill #0

## 2023-06-20 NOTE — Patient Instructions (Signed)
Transient Ischemic Attack A transient ischemic attack (TIA) happens when blood supply to the brain is blocked temporarily. A TIA causes stroke-like symptoms that go away quickly without causing any permanent damage. Having a TIA can be considered a warning sign for a stroke and should not be ignored. A person who has a TIA is at higher risk for a stroke. What are the causes? This condition is caused by a temporary blockage in an artery in the head or neck. This means the brain does not get the blood supply it needs. A blockage can be caused by: Fatty buildup in an artery in the head or neck (atherosclerosis). A blood clot traveling from the heart. An artery tear (dissection). Inflammation of an artery (vasculitis). Sometimes the cause is not known. What increases the risk? Certain factors make you more likely to develop this condition. Some of these are things you can change, including: Using products that contain nicotine or tobacco. Being inactive. Heavy alcohol use. Drug use, especially cocaine and methamphetamine. Medical conditions that may increase your risk include: High blood pressure (hypertension). High cholesterol. Diabetes. Heart disease (coronary artery disease). An irregular heartbeat, also called atrial fibrillation (AFib). Sickle cell disease. Blood clotting disorders (hypercoagulable state). Other risk factors include: Being over the age of 56. Being male. Obesity. Sleep problems such as sleep apnea. Family history of stroke. Previous history of blood clots, stroke, TIA, or heart attack. What are the signs or symptoms? Symptoms of a TIA are the same as those of a stroke. The symptoms develop suddenly, and then go away quickly. They may include: Dizziness, loss of balance and coordination, or trouble walking. Vision changes, such as double vision, blurred vision, or loss of vision. Weakness or numbness in your face, arm, or leg, especially on one side of your  body. Trouble speaking, understanding speech, or both (aphasia). Nausea and vomiting. Severe headache. Confusion. If possible, note what time your symptoms started. Tell your health care provider. How is this diagnosed? This condition may be diagnosed based on: Your symptoms and medical history. A physical exam. Imaging tests, usually a CT scan or MRI of the brain. Blood tests. You may also have other tests, including: Electrocardiogram (ECG). Echocardiogram. Continuous heart monitoring. Carotid ultrasound. A scan of blood circulation in the brain (CT angiogram or MR angiogram). How is this treated? The goal of treatment is to reduce the risk for a stroke. Stroke prevention therapies may include: Changes to diet and lifestyle, such as being physically active and stopping smoking. Treating other health conditions, such as diabetes or AFib. Medicines to thin the blood (antiplatelets or anticoagulants). Blood pressure medicines. Medicines to reduce cholesterol. If testing shows a narrowing in the arteries to your brain, your health care provider may recommend a procedure, such as: Carotid endarterectomy. This is done to remove the blockage from your artery. Carotid angioplasty and stenting. This uses a small mesh tube (stent) to open or widen an artery in the neck. The stent helps keep the artery open by supporting the artery walls. Follow these instructions at home: Medicines Take over-the-counter and prescription medicines only as told by your health care provider. If you were told to take a medicine to thin your blood, such as aspirin or an anticoagulant, use it exactly as told by your health care provider. Taking too much blood-thinning medicine can cause bleeding. Taking too little will not protect you against a stroke and other problems. Eating and drinking  Eat 5 or more servings of fruits and  vegetables each day. Follow guidelines from your health care provider about your  diet. You may need to follow a certain diet to help manage risk factors for stroke. This may include: Eating a low-fat, low-salt diet. Choosing high-fiber foods. Limiting carbohydrates and sugar. If you drink alcohol: Limit how much you have to: 0-1 drink a day for women who are not pregnant. 0-2 drinks a day for men. Know how much alcohol is in your drink. In the [place] one drink equals one 12 oz bottle of beer (355 mL), one 5 oz glass of wine (148 mL), or one 1 oz glass of hard liquor (44 mL). General instructions Maintain a healthy weight. Try to get at least 30 minutes of exercise on most days. Get treatment if you have sleep apnea. Do not use any products that contain nicotine or tobacco. These products include cigarettes, chewing tobacco, and vaping devices, such as e-cigarettes. If you need help quitting, ask your health care provider. Do not use illegal drugs. Keep all follow-up visits. Your health care provider will want to know if you have any more symptoms and to check blood labs if any medicines were prescribed. Where to find more information American Stroke Association: stroke.org Get help right away if: You have chest pain. You have fast or irregular heartbeats (palpitations). You have any symptoms of a stroke. "BE FAST" is an easy way to remember the main warning signs of a stroke. B - Balance. Signs are dizziness, sudden trouble walking, or loss of balance. E - Eyes. Signs are trouble seeing or a sudden change in vision. F - Face. Signs are sudden weakness or numbness of the face, or the face or eyelid drooping on one side. A - Arms. Signs are weakness or numbness in an arm. This happens suddenly and usually on one side of the body. S - Speech.Signs are sudden trouble speaking, slurred speech, or trouble understanding what people say. T - Time. Time to call emergency services. Write down what time symptoms started. You have other signs of a stroke, such as: A sudden,  severe headache with no known cause. Nausea or vomiting. Seizure. These symptoms may be an emergency. Get help right away. Call 911. Do not wait to see if the symptoms will go away. Do not drive yourself to the hospital. This information is not intended to replace advice given to you by your health care provider. Make sure you discuss any questions you have with your health care provider. Document Revised: 03/09/2022 Document Reviewed: 03/09/2022 Elsevier Patient Education  2024 [employer] Inc[employer] Steps to Quit Smoking Smoking tobacco is the leading cause of preventable death. It can affect almost every organ in the body. Smoking puts you and those around you at risk for developing many serious chronic diseases. Quitting smoking can be very challenging. Do not get discouraged if you are not successful the first time. Some people need to make many attempts to quit before they achieve long-term success. Do your best to stick to your quit plan, and talk with your health care provider if you have any questions or concerns. How do I get ready to quit? When you decide to quit smoking, create a plan to help you succeed. Before you quit: Pick a date to quit. Set a date within the next 2 weeks to give you time to prepare. Write down the reasons why you are quitting. Keep this list in places where you will see it often. Tell your family, friends, and co-workers that you  are quitting. Support from people you are close to can make quitting easier. Talk with your health care provider about your options for quitting smoking. Find out what treatment options are covered by your health insurance. Identify people, places, things, and activities that make you want to smoke (triggers). Avoid them. What first steps can I take to quit smoking? Throw away all cigarettes at home, at work, and in your car. Throw away smoking accessories, such as Set designer. Clean your car. Make sure to empty the ashtray. Clean  your home, including curtains and carpets. What strategies can I use to quit smoking? Talk with your health care provider about combining strategies, such as taking medicines while you are also receiving in-person counseling. Using these two strategies together makes you more likely to succeed in quitting than if you used either strategy on its own. If you are pregnant or breastfeeding, talk with your health care provider about finding counseling or other support strategies to quit smoking. Do not take medicine to help you quit smoking unless your health care provider tells you to. Quit right away Quit smoking completely, instead of gradually reducing how much you smoke over a period of time. Stopping smoking right away may be more successful than gradually quitting. Attend in-person counseling to help you build problem-solving skills. You are more likely to succeed in quitting if you attend counseling sessions regularly. Even short sessions of 10 minutes can be effective. Take medicine You may take medicines to help you quit smoking. Some medicines require a prescription. You can also purchase over-the-counter medicines. Medicines may have nicotine in them to replace the nicotine in cigarettes. Medicines may: Help to stop cravings. Help to relieve withdrawal symptoms. Your health care provider may recommend: Nicotine patches, gum, or lozenges. Nicotine inhalers or sprays. Non-nicotine medicine that you take by mouth. Find resources Find resources and support systems that can help you quit smoking and remain smoke-free after you quit. These resources are most helpful when you use them often. They include: Online chats with a Veterinary surgeon. Telephone quitlines. Printed Materials engineer. Support groups or group counseling. Text messaging programs. Mobile phone apps or applications. Use apps that can help you stick to your quit plan by providing reminders, tips, and encouragement. Examples of free  services include Quit Guide from the CDC and smokefree.gov  What can I do to make it easier to quit?  Reach out to your family and friends for support and encouragement. Call telephone quitlines, such as 1-800-QUIT-NOW, reach out to support groups, or work with a counselor for support. Ask people who smoke to avoid smoking around you. Avoid places that trigger you to smoke, such as bars, parties, or smoke-break areas at work. Spend time with people who do not smoke. Lessen the stress in your life. Stress can be a smoking trigger for some people. To lessen stress, try: Exercising regularly. Doing deep-breathing exercises. Doing yoga. Meditating. What benefits will I see if I quit smoking? Over time, you should start to see positive results, such as: Improved sense of smell and taste. Decreased coughing and sore throat. Slower heart rate. Lower blood pressure. Clearer and healthier skin. The ability to breathe more easily. Fewer sick days. Summary Quitting smoking can be very challenging. Do not get discouraged if you are not successful the first time. Some people need to make many attempts to quit before they achieve long-term success. When you decide to quit smoking, create a plan to help you succeed. Quit smoking right  away, not slowly over a period of time. Find resources and support systems that can help you quit smoking and remain smoke-free after you quit. This information is not intended to replace advice given to you by your health care provider. Make sure you discuss any questions you have with your health care provider. Document Revised: 09/15/2021 Document Reviewed: 09/15/2021 Elsevier Patient Education  2024 [employer].

## 2023-06-20 NOTE — Progress Notes (Signed)
Established Patient Office Visit  Subjective   Patient ID: Gary [name], male    DOB: 05/14/1971  Age: 52 y.o. MRN: [id number]  Chief Complaint  Patient presents with   Follow-up    HPI  Gary [name] is a a 52 y/o with a history of HTN, hyperlipidemia, seizure disorder, CVA, TIA, alcohol use, tobacco use, and GERD who presents for follow up. He visited the cardiologist on 04/25/2023, cardiologist discontinued Pravachol and started patient on Crestor 40 mg. His total cholesterol has decreased from 243 mg/dL to 409 mg/dL. He states that he smokes 5 cigarrettes per day, he states that he is not intrested in quitting today but eventually.  His AST decreased from 99 to 67 IU/L and ALT  decreased from 76 to 48 IU/L . He continues to drink  3 or 4 mixed drinks per day. He denies right right upper quadrant abdominal pain, jaundice and dark urine. His serum creatinine increased from 1.57 mg/dl to 8.11 mg/dl and eGFR decreased from 53 to 51.  His Levetiracetam level checked on 06/13/23 was  18.9 micrograms/ml  and he denies seizure activities. He states that he does not monitors his blood pressure but has a blood pressure machine at home. Overall, he is doing well and offers no further complaints at this time.    Review of Systems  HENT: Negative.    Eyes:  Positive for blurred vision.       Blurred vision due to not having on eye glasses during visit.   Respiratory: Negative.  Negative for cough.   Cardiovascular: Negative.   Gastrointestinal: Negative.   Skin: Negative.   Neurological: Negative.   Psychiatric/Behavioral: Negative.        Objective:     BP 128/76   Pulse 78   Ht 5\' 10"  (1.778 m)   Wt 213 lb 11.2 oz (96.9 kg)   SpO2 96%   BMI 30.66 kg/m  BP Readings from Last 3 Encounters:  06/20/23 128/76  04/25/23 (!) 156/90  03/07/23 136/81   Wt Readings from Last 3 Encounters:  06/20/23 213 lb 11.2 oz (96.9 kg)  04/25/23 217 lb 6.4 oz (98.6 kg)  03/07/23 216 lb 12.8 oz  (98.3 kg)      Physical Exam Constitutional:      Appearance: Normal appearance. He is normal weight.  HENT:     Head: Normocephalic and atraumatic.     Right Ear: Tympanic membrane, ear canal and external ear normal.     Left Ear: Tympanic membrane, ear canal and external ear normal.     Nose: Nose normal.     Mouth/Throat:     Mouth: Mucous membranes are moist.     Pharynx: Oropharynx is clear.  Eyes:     Conjunctiva/sclera: Conjunctivae normal.  Cardiovascular:     Rate and Rhythm: Normal rate and regular rhythm.     Pulses: Normal pulses.     Heart sounds: Normal heart sounds.  Pulmonary:     Effort: Pulmonary effort is normal.     Breath sounds: Normal breath sounds.  Abdominal:     General: Bowel sounds are normal.     Palpations: Abdomen is soft.  Musculoskeletal:        General: Normal range of motion.     Cervical back: Normal range of motion.  Skin:    General: Skin is warm and dry.  Neurological:     Mental Status: He is alert and oriented to person, place, and time. Mental status  is at baseline.  Psychiatric:        Mood and Affect: Mood normal.        Behavior: Behavior normal.        Thought Content: Thought content normal.        Judgment: Judgment normal.      No results found for any visits on 06/20/23.  Last CBC Lab Results  Component Value Date   WBC 6.5 02/14/2023   HGB 11.8 (L) 02/14/2023   HCT 34.1 (L) 02/14/2023   MCV 97 02/14/2023   MCH 33.4 (H) 02/14/2023   RDW 11.5 (L) 02/14/2023   PLT 238 07/31/2022   Last metabolic panel Lab Results  Component Value Date   GLUCOSE 94 06/13/2023   NA 142 06/13/2023   K 3.6 06/13/2023   CL 107 (H) 06/13/2023   CO2 20 06/13/2023   BUN 21 06/13/2023   CREATININE 1.61 (H) 06/13/2023   EGFR 51 (L) 06/13/2023   CALCIUM 9.4 06/13/2023   PHOS 3.0 08/16/2022   PROT 7.5 06/13/2023   ALBUMIN 4.4 06/13/2023   LABGLOB 3.1 06/13/2023   AGRATIO 1.5 01/23/2023   BILITOT 0.4 06/13/2023   ALKPHOS 128  (H) 06/13/2023   AST 67 (H) 06/13/2023   ALT 48 (H) 06/13/2023   ANIONGAP 12 07/31/2022   Last lipids Lab Results  Component Value Date   CHOL 127 06/13/2023   HDL 39 (L) 06/13/2023   LDLCALC 68 06/13/2023   TRIG 108 06/13/2023   CHOLHDL 3.3 06/13/2023   Last hemoglobin A1c Lab Results  Component Value Date   HGBA1C 5.5 03/07/2023   Last thyroid functions Lab Results  Component Value Date   TSH 1.400 02/02/2021   Last vitamin D No results found for: "25OHVITD2", "25OHVITD3", "VD25OH" Last vitamin B12 and Folate Lab Results  Component Value Date   VITAMINB12 447 12/29/2020   FOLATE 5.2 12/03/2018      The ASCVD Risk score (Arnett DK, et al., 2019) failed to calculate for the following reasons:   The patient has a prior MI or stroke diagnosis    Assessment & Plan:  1. Essential hypertension -His blood pressure is under control. He will continue on current medication, follow a DASH diet and exercise as tolerated. - amLODipine (NORVASC) 10 MG tablet; Take 1 tablet (10 mg total) by mouth every evening.  Dispense: 90 tablet; Refill: 1 - carvedilol (COREG) 25 MG tablet; Take 1 tablet (25 mg total) by mouth 2 (two) times daily with a meal.  Dispense: 180 tablet; Refill: 2  2. Cerebrovascular accident (CVA), unspecified mechanism (HCC) -He will continue on medication as prescribed. He was encouraged to stop smoking. He was advised to go to the ED for hematuria, hematochezia and active bleeding. - clopidogrel (PLAVIX) 75 MG tablet; Take 1 tablet (75 mg total) by mouth daily.  Dispense: 90 tablet; Refill: 0  3. Localization-related (focal) (partial) symptomatic epilepsy and epileptic syndromes with complex partial seizures, not intractable, without status epilepticus (HCC) -He denies seizure activities, and will continue on current medication as prescribed.  - levETIRAcetam (KEPPRA) 1000 MG tablet; Take 1 tablet (1,000 mg total) by mouth 2 (two) times daily.  Dispense: 60  tablet; Refill: 1  4. Gastroesophageal reflux disease, unspecified whether esophagitis present -His acid reflux is under control, he will continue on medication as prescribed.  -He was encouraged to avoid spicy food, sodas, sour juices, and heavy meal.  -Avoid eating 4 hours prior to bedtime.  -Elevate the head of the bed  at night.  - pantoprazole (PROTONIX) 40 MG tablet; Take 1 tablet (40 mg total) by mouth daily.  Dispense: 90 tablet; Refill: 1  5. Elevated serum creatinine -Serum creatinine elevated . He was encouraged to increase his water intake and will recheck his renal function- Basic metabolic panel; Future  6. Elevated liver enzymes - His liver enzymes are elevated. He was encouraged to abstain from alcohol consumption and stay well hydrated.  - Hepatic function panel; Future    Follow up in clinic in about 8 weeks ( around 09/12/2023), or if symptoms worsen or fail to improve.    Chioma Trellis Paganini, NP, FNP student

## 2023-06-27 ENCOUNTER — Ambulatory Visit: Payer: Self-pay | Attending: Cardiology | Admitting: Cardiology

## 2023-06-28 ENCOUNTER — Inpatient Hospital Stay: Payer: Medicaid Other

## 2023-06-28 ENCOUNTER — Emergency Department: Payer: Medicaid Other

## 2023-06-28 ENCOUNTER — Inpatient Hospital Stay
Admission: EM | Admit: 2023-06-28 | Discharge: 2023-07-10 | DRG: 674 | Discharge status: Against Medical Advice | Payer: Medicaid Other | Attending: Internal Medicine | Admitting: Internal Medicine

## 2023-06-28 ENCOUNTER — Other Ambulatory Visit: Payer: Self-pay

## 2023-06-28 DIAGNOSIS — R748 Abnormal levels of other serum enzymes: Secondary | ICD-10-CM | POA: Diagnosis present

## 2023-06-28 DIAGNOSIS — D631 Anemia in chronic kidney disease: Secondary | ICD-10-CM | POA: Diagnosis present

## 2023-06-28 DIAGNOSIS — R7401 Elevation of levels of liver transaminase levels: Secondary | ICD-10-CM | POA: Diagnosis present

## 2023-06-28 DIAGNOSIS — Z79899 Other long term (current) drug therapy: Secondary | ICD-10-CM

## 2023-06-28 DIAGNOSIS — D72829 Elevated white blood cell count, unspecified: Secondary | ICD-10-CM | POA: Insufficient documentation

## 2023-06-28 DIAGNOSIS — N179 Acute kidney failure, unspecified: Secondary | ICD-10-CM

## 2023-06-28 DIAGNOSIS — N492 Inflammatory disorders of scrotum: Secondary | ICD-10-CM | POA: Diagnosis present

## 2023-06-28 DIAGNOSIS — N433 Hydrocele, unspecified: Secondary | ICD-10-CM | POA: Diagnosis present

## 2023-06-28 DIAGNOSIS — E872 Acidosis, unspecified: Secondary | ICD-10-CM | POA: Diagnosis present

## 2023-06-28 DIAGNOSIS — E669 Obesity, unspecified: Secondary | ICD-10-CM | POA: Diagnosis present

## 2023-06-28 DIAGNOSIS — Z6830 Body mass index (BMI) 30.0-30.9, adult: Secondary | ICD-10-CM

## 2023-06-28 DIAGNOSIS — I120 Hypertensive chronic kidney disease with stage 5 chronic kidney disease or end stage renal disease: Secondary | ICD-10-CM | POA: Diagnosis present

## 2023-06-28 DIAGNOSIS — F17210 Nicotine dependence, cigarettes, uncomplicated: Secondary | ICD-10-CM | POA: Diagnosis present

## 2023-06-28 DIAGNOSIS — W19XXXA Unspecified fall, initial encounter: Secondary | ICD-10-CM | POA: Diagnosis present

## 2023-06-28 DIAGNOSIS — K760 Fatty (change of) liver, not elsewhere classified: Secondary | ICD-10-CM | POA: Diagnosis present

## 2023-06-28 DIAGNOSIS — Z23 Encounter for immunization: Secondary | ICD-10-CM | POA: Diagnosis not present

## 2023-06-28 DIAGNOSIS — N186 End stage renal disease: Secondary | ICD-10-CM | POA: Diagnosis present

## 2023-06-28 DIAGNOSIS — G40909 Epilepsy, unspecified, not intractable, without status epilepticus: Secondary | ICD-10-CM | POA: Diagnosis present

## 2023-06-28 DIAGNOSIS — I861 Scrotal varices: Secondary | ICD-10-CM | POA: Diagnosis present

## 2023-06-28 DIAGNOSIS — N503 Cyst of epididymis: Secondary | ICD-10-CM | POA: Diagnosis present

## 2023-06-28 DIAGNOSIS — F1010 Alcohol abuse, uncomplicated: Secondary | ICD-10-CM | POA: Diagnosis present

## 2023-06-28 DIAGNOSIS — E8809 Other disorders of plasma-protein metabolism, not elsewhere classified: Secondary | ICD-10-CM | POA: Diagnosis present

## 2023-06-28 DIAGNOSIS — R739 Hyperglycemia, unspecified: Secondary | ICD-10-CM | POA: Diagnosis present

## 2023-06-28 DIAGNOSIS — K829 Disease of gallbladder, unspecified: Secondary | ICD-10-CM | POA: Diagnosis present

## 2023-06-28 DIAGNOSIS — E7800 Pure hypercholesterolemia, unspecified: Secondary | ICD-10-CM | POA: Diagnosis present

## 2023-06-28 DIAGNOSIS — Z720 Tobacco use: Secondary | ICD-10-CM | POA: Diagnosis present

## 2023-06-28 DIAGNOSIS — R0602 Shortness of breath: Principal | ICD-10-CM | POA: Diagnosis present

## 2023-06-28 DIAGNOSIS — I10 Essential (primary) hypertension: Secondary | ICD-10-CM

## 2023-06-28 DIAGNOSIS — Z7902 Long term (current) use of antithrombotics/antiplatelets: Secondary | ICD-10-CM

## 2023-06-28 DIAGNOSIS — Z1152 Encounter for screening for COVID-19: Secondary | ICD-10-CM

## 2023-06-28 DIAGNOSIS — E871 Hypo-osmolality and hyponatremia: Secondary | ICD-10-CM | POA: Diagnosis present

## 2023-06-28 DIAGNOSIS — Z8249 Family history of ischemic heart disease and other diseases of the circulatory system: Secondary | ICD-10-CM

## 2023-06-28 DIAGNOSIS — K5900 Constipation, unspecified: Secondary | ICD-10-CM | POA: Diagnosis not present

## 2023-06-28 DIAGNOSIS — R809 Proteinuria, unspecified: Secondary | ICD-10-CM | POA: Diagnosis present

## 2023-06-28 DIAGNOSIS — G4700 Insomnia, unspecified: Secondary | ICD-10-CM | POA: Diagnosis present

## 2023-06-28 DIAGNOSIS — I639 Cerebral infarction, unspecified: Secondary | ICD-10-CM

## 2023-06-28 DIAGNOSIS — N170 Acute kidney failure with tubular necrosis: Secondary | ICD-10-CM | POA: Diagnosis present

## 2023-06-28 DIAGNOSIS — Z992 Dependence on renal dialysis: Secondary | ICD-10-CM | POA: Diagnosis not present

## 2023-06-28 DIAGNOSIS — R319 Hematuria, unspecified: Secondary | ICD-10-CM | POA: Diagnosis present

## 2023-06-28 DIAGNOSIS — K828 Other specified diseases of gallbladder: Secondary | ICD-10-CM

## 2023-06-28 DIAGNOSIS — E559 Vitamin D deficiency, unspecified: Secondary | ICD-10-CM | POA: Diagnosis present

## 2023-06-28 DIAGNOSIS — Z8673 Personal history of transient ischemic attack (TIA), and cerebral infarction without residual deficits: Secondary | ICD-10-CM

## 2023-06-28 DIAGNOSIS — E785 Hyperlipidemia, unspecified: Secondary | ICD-10-CM | POA: Diagnosis present

## 2023-06-28 DIAGNOSIS — Z7982 Long term (current) use of aspirin: Secondary | ICD-10-CM

## 2023-06-28 DIAGNOSIS — D649 Anemia, unspecified: Secondary | ICD-10-CM

## 2023-06-28 DIAGNOSIS — K219 Gastro-esophageal reflux disease without esophagitis: Secondary | ICD-10-CM | POA: Diagnosis present

## 2023-06-28 DIAGNOSIS — Z5971 Insufficient health insurance coverage: Secondary | ICD-10-CM

## 2023-06-28 DIAGNOSIS — Z791 Long term (current) use of non-steroidal anti-inflammatories (NSAID): Secondary | ICD-10-CM

## 2023-06-28 DIAGNOSIS — R569 Unspecified convulsions: Secondary | ICD-10-CM

## 2023-06-28 LAB — MAGNESIUM: Magnesium: 2.3 mg/dL (ref 1.7–2.4)

## 2023-06-28 LAB — COMPREHENSIVE METABOLIC PANEL
ALT: 76 U/L — ABNORMAL HIGH (ref 0–44)
AST: 98 U/L — ABNORMAL HIGH (ref 15–41)
Albumin: 3.3 g/dL — ABNORMAL LOW (ref 3.5–5.0)
Alkaline Phosphatase: 156 U/L — ABNORMAL HIGH (ref 38–126)
Anion gap: 14 (ref 5–15)
BUN: 34 mg/dL — ABNORMAL HIGH (ref 6–20)
CO2: 20 mmol/L — ABNORMAL LOW (ref 22–32)
Calcium: 9.1 mg/dL (ref 8.9–10.3)
Chloride: 98 mmol/L (ref 98–111)
Creatinine, Ser: 4.18 mg/dL — ABNORMAL HIGH (ref 0.61–1.24)
GFR, Estimated: 16 mL/min — ABNORMAL LOW (ref >60)
Glucose, Bld: 140 mg/dL — ABNORMAL HIGH (ref 70–99)
Potassium: 4 mmol/L (ref 3.5–5.1)
Sodium: 132 mmol/L — ABNORMAL LOW (ref 135–145)
Total Bilirubin: 1.9 mg/dL — ABNORMAL HIGH (ref 0.3–1.2)
Total Protein: 7.9 g/dL (ref 6.5–8.1)

## 2023-06-28 LAB — ETHANOL: Alcohol, Ethyl (B): <10 mg/dL (ref <10)

## 2023-06-28 LAB — PROTIME-INR
INR: 1.2 (ref 0.8–1.2)
Prothrombin Time: 15.7 seconds — ABNORMAL HIGH (ref 11.4–15.2)

## 2023-06-28 LAB — CBC
HCT: 26.6 % — ABNORMAL LOW (ref 39.0–52.0)
Hemoglobin: 9.1 g/dL — ABNORMAL LOW (ref 13.0–17.0)
MCH: 32.3 pg (ref 26.0–34.0)
MCHC: 34.2 g/dL (ref 30.0–36.0)
MCV: 94.3 fL (ref 80.0–100.0)
Platelets: 166 10*3/uL (ref 150–400)
RBC: 2.82 MIL/uL — ABNORMAL LOW (ref 4.22–5.81)
RDW: 12.6 % (ref 11.5–15.5)
WBC: 17.5 10*3/uL — ABNORMAL HIGH (ref 4.0–10.5)
nRBC: 0 % (ref 0.0–0.2)

## 2023-06-28 LAB — TYPE AND SCREEN
ABO/RH(D): O POS
Antibody Screen: NEGATIVE

## 2023-06-28 LAB — BRAIN NATRIURETIC PEPTIDE: B Natriuretic Peptide: 69.5 pg/mL (ref 0.0–100.0)

## 2023-06-28 LAB — APTT: aPTT: 35 seconds (ref 24–36)

## 2023-06-28 LAB — TROPONIN I (HIGH SENSITIVITY): Troponin I (High Sensitivity): 11 ng/L (ref <18)

## 2023-06-28 LAB — CK: Total CK: 109 U/L (ref 49–397)

## 2023-06-28 LAB — LACTIC ACID, PLASMA: Lactic Acid, Venous: 1.2 mmol/L (ref 0.5–1.9)

## 2023-06-28 MED ORDER — SENNOSIDES-DOCUSATE SODIUM 8.6-50 MG PO TABS: 1.0000 | ORAL_TABLET | Freq: Every evening | ORAL | Status: DC | PRN

## 2023-06-28 MED ORDER — ACETAMINOPHEN 325 MG PO TABS
650.0000 mg | ORAL_TABLET | Freq: Four times a day (QID) | ORAL | Status: AC | PRN
  Administered 2023-06-29: 650 mg via ORAL
  Filled 2023-06-28: qty 2

## 2023-06-28 MED ORDER — SODIUM CHLORIDE 0.9 % IV BOLUS
1000.0000 mL | Freq: Once | INTRAVENOUS | Status: AC
  Administered 2023-06-28: 1000 mL via INTRAVENOUS

## 2023-06-28 MED ORDER — SODIUM CHLORIDE 0.9 % IV SOLN: INTRAVENOUS | Status: AC

## 2023-06-28 MED ORDER — PNEUMOCOCCAL 20-VAL CONJ VACC 0.5 ML IM SUSY
0.5000 mL | PREFILLED_SYRINGE | INTRAMUSCULAR | Status: AC
  Administered 2023-06-29: 0.5 mL via INTRAMUSCULAR
  Filled 2023-06-28: qty 0.5

## 2023-06-28 MED ORDER — PANTOPRAZOLE 80MG IVPB - SIMPLE MED
80.0000 mg | Freq: Once | INTRAVENOUS | Status: AC
  Administered 2023-06-28: 80 mg via INTRAVENOUS
  Filled 2023-06-28: qty 100

## 2023-06-28 MED ORDER — ONDANSETRON HCL 4 MG PO TABS: 4.0000 mg | ORAL_TABLET | Freq: Four times a day (QID) | ORAL | Status: AC | PRN

## 2023-06-28 MED ORDER — MELATONIN 5 MG PO TABS
5.0000 mg | ORAL_TABLET | Freq: Every evening | ORAL | Status: DC | PRN
  Administered 2023-06-29: 5 mg via ORAL
  Filled 2023-06-28: qty 1

## 2023-06-28 MED ORDER — ACETAMINOPHEN 650 MG RE SUPP: 650.0000 mg | Freq: Four times a day (QID) | RECTAL | Status: AC | PRN

## 2023-06-28 MED ORDER — ONDANSETRON HCL 4 MG/2ML IJ SOLN: 4.0000 mg | Freq: Four times a day (QID) | INTRAMUSCULAR | Status: AC | PRN

## 2023-06-28 MED ORDER — GUAIFENESIN-DM 100-10 MG/5ML PO SYRP
5.0000 mL | ORAL_SOLUTION | ORAL | Status: DC | PRN
  Administered 2023-06-29: 5 mL via ORAL
  Filled 2023-06-28: qty 10

## 2023-06-28 MED ORDER — LEVETIRACETAM 500 MG PO TABS
500.0000 mg | ORAL_TABLET | Freq: Two times a day (BID) | ORAL | Status: DC
  Administered 2023-06-28 – 2023-07-10 (×21): 500 mg via ORAL
  Filled 2023-06-28 (×24): qty 1

## 2023-06-28 MED ORDER — SODIUM BICARBONATE 650 MG PO TABS
325.0000 mg | ORAL_TABLET | Freq: Two times a day (BID) | ORAL | Status: AC
  Administered 2023-06-28 – 2023-06-29 (×2): 325 mg via ORAL
  Filled 2023-06-28 (×2): qty 1

## 2023-06-28 MED ORDER — INFLUENZA VIRUS VACC SPLIT PF (FLUZONE) 0.5 ML IM SUSY
0.5000 mL | PREFILLED_SYRINGE | INTRAMUSCULAR | Status: AC
  Administered 2023-06-29: 0.5 mL via INTRAMUSCULAR
  Filled 2023-06-28: qty 0.5

## 2023-06-28 MED ORDER — NICOTINE 21 MG/24HR TD PT24: 21.0000 mg | MEDICATED_PATCH | Freq: Every day | TRANSDERMAL | Status: DC | PRN

## 2023-06-28 MED ORDER — HEPARIN SODIUM (PORCINE) 5000 UNIT/ML IJ SOLN
5000.0000 [IU] | Freq: Three times a day (TID) | INTRAMUSCULAR | Status: DC
  Administered 2023-06-28 – 2023-07-10 (×35): 5000 [IU] via SUBCUTANEOUS
  Filled 2023-06-28 (×35): qty 1

## 2023-06-28 MED ORDER — LORAZEPAM 2 MG/ML IJ SOLN: 2.0000 mg | INTRAMUSCULAR | Status: DC | PRN

## 2023-06-28 NOTE — ED Triage Notes (Signed)
Pt reports shob for past couple days. Denies cp, n/v. Reports feeling dizzy as well.  Daily ETOH, last drink last night

## 2023-06-28 NOTE — Assessment & Plan Note (Signed)
-  Melatonin 5 mg nightly as needed for sleep

## 2023-06-28 NOTE — Assessment & Plan Note (Addendum)
Calculated creatinine clearance is 13 On CKD 3A, suspect multifactorial including prerenal in setting of poor p.o./fluid intake Status post sodium chloride 1 L bolus per EDP Sodium chloride infusion at 125 mL/h, 2 days ordered Ultrasound renal

## 2023-06-28 NOTE — H&P (Addendum)
History and Physical   Gary [name] ZOX:[id number] DOB: 1971/04/04 DOA: 06/28/2023  PCP: Rolm Gala, NP  Outpatient Specialists: Dr. Lonna Cobb, urology Patient coming from: Home  I have personally briefly reviewed patient's old medical records in [facility] EMR.  Chief Concern: Shortness of breath and dizziness  HPI: Gary [name] is a 52 year old male with history of alcohol abuse, hypertension, hyperlipidemia, history of TIA, GERD, erectile dysfunction, tobacco use, history of seizure, who presents to the emergency department for chief concerns of shortness of breath and dizziness.  Vitals in the ED showed temperature of 98.1, respiration rate of 20, heart rate 93, blood pressure 131/93, SpO2 100% on room air.  Serum sodium is 132, potassium 4.0, chloride 98, bicarb 20, BUN of 34, serum creatinine of 4.18, EGFR of 16, nonfasting blood glucose 140, WBC 17.5, hemoglobin 9.1, platelets of 166.  Magnesium is 2.3.  Lactic acid is 1.2.  Alk phos was 156.  AST was 98.  ALT 76.  BNP was ordered by EDP and was within normal limits.  ED treatment: Protonix 80 mg IV one-time dose, sodium chloride 1 L bolus. ------------------------------- At bedside, patient was able to tell me his name, age, location, current calendar year.  He reports that over the last 2 days, he has been having worsening shortness of breath.  He states that he experiences shortness of breath even with tying his shoes.  He denies chest pain, he denies chest pressure, dysphagia, odynophagia, nausea, vomiting, dysuria, hematuria, diarrhea, swelling of his lower extremities, fever, trauma to his person.  He reports his diet is not as good as it should be and he does not drink as much water as he should.  Most days he will have 1 bottle water sometimes 2 bottles of water.  He endorses decreased urine output.  He reports he last urinated at 8:30 AM on day of admission.  Reports that urine was a little  dark.  Social history: Patient lives at home.  His uncle lives with him.  He endorses EtOH drinks, 3-4 drinks at night.  He mixes brandy with soda and ice.  He works at [employer].  ROS: Constitutional: no weight change, no fever ENT/Mouth: no sore throat, no rhinorrhea Eyes: no eye pain, no vision changes Cardiovascular: no chest pain, no dyspnea,  no edema, no palpitations Respiratory: no cough, no sputum, no wheezing Gastrointestinal: no nausea, no vomiting, no diarrhea, no constipation Genitourinary: no urinary incontinence, no dysuria, no hematuria Musculoskeletal: no arthralgias, no myalgias Skin: no skin lesions, no pruritus, Neuro: + weakness, no loss of consciousness, no syncope Psych: no anxiety, no depression, + decrease appetite Heme/Lymph: no bruising, no bleeding  ED Course: Discussed with emergency medicine provider Patient requiring hospitalization for chief concerns of acute kidney injury.  Assessment/Plan  Principal Problem:   Shortness of breath Active Problems:   Elevated liver enzymes   AKI (acute kidney injury) (HCC)   Gallbladder mass   Seizures (HCC)   Alcohol abuse   Hyperlipidemia   Hyperglycemia   Tobacco abuse   Insomnia   Metabolic acidosis   Hyponatremia   Leukocytosis   Assessment and Plan:  * Shortness of breath Etiology workup in progress, differentials is broad at this point given patient history of tobacco use and alcohol use and dependence Will check high sensitive troponin Right upper quadrant abdominal ultrasound ordered pending completion and read at the time of this dictation Continue sodium chloride infusion at 125 mL/h, 2 days ordered Check BMP in  the a.m. Admit to telemetry cardiac, inpatient  Gallbladder mass With elevated alk phos Focal nodular measuring up to 19 mm x 7 mm x 5 mm on RUQ [place], which was not seen on RUQ [place] in 02/21/23, increased risk of carcinoma given the size read however before surgical intervention is  considered, I would like further imaging to further delineate the lesion MRCP with and without contrast has been deferred on admission due to acute kidney injury AM team to order MRCP with and without contrast pending renal function  AKI (acute kidney injury) (HCC) Calculated creatinine clearance is 13 On CKD 3A, suspect multifactorial including prerenal in setting of poor p.o./fluid intake Status post sodium chloride 1 L bolus per EDP Sodium chloride infusion at 125 mL/h, 2 days ordered Ultrasound renal  Elevated liver enzymes Suspect secondary to alcohol abuse however as patient had right upper quadrant abdominal discomfort with EDP, RUQ abd [place] ordered by EDP Admit to telemetry cardiac, inpatient  Seizures (HCC) Calculated creatinine clearance is 13 mL/min on admission Patient on Keppra 1000 mg p.o. twice daily Keppra reduced dosing to 500 mg p.o. twice daily in setting of acute kidney injury AM team to resume home Keppra dosing when the benefits outweigh the risk  Leukocytosis Workup in progress, no definitive source of infection at this time and lactic acid not elevated. No abx at this time RUQ abdominal ultrasound pending read and UA ordered  Hyponatremia Mild, asymptomatic Sodium bicarbonate 325 mg p.o. twice daily, 2 doses ordered Check BMP in a.m.  Metabolic acidosis Mild, secondary to acute kidney injury Sodium bicarbonate 325 mg p.o. twice daily, 2 doses ordered  Insomnia Melatonin 5 mg nightly as needed for sleep  Tobacco abuse As needed nicotine patch ordered  Chart reviewed.   DVT prophylaxis: Heparin 5000 units every 8 hours Code Status: Full code Diet: Renal/carb modified Family Communication: A phone call was offered, patient declined Disposition Plan: Pending clinical course Consults called: None at this time Admission status: Inpatient, telemetry cardiac  Past Medical History:  Diagnosis Date   Alcohol use    Carotid artery occlusion    GERD  (gastroesophageal reflux disease)    Hypercholesteremia    Hypertension    Seizures (HCC)    Stroke (HCC)    TIA 12/29/20, re-admitted 01/14/21 for sequelae   Tobacco abuse    Past Surgical History:  Procedure Laterality Date   NO PAST SURGERIES     Social History:  reports that he has been smoking cigarettes. He has a 8.5 pack-year smoking history. He has never used smokeless tobacco. He reports current alcohol use of about 21.0 standard drinks of alcohol per week. He reports that he does not use drugs.  No Known Allergies Family History  Problem Relation Age of Onset   Heart attack Mother    Hypertension Mother        pt reports mother has stent   Other Father        unknown medical history   Healthy Sister    Other Maternal Grandmother        unknown medical history   Other Maternal Grandfather 39       choked on a watermelon seed   Other Paternal Grandmother        unknown medical history   Other Paternal Grandfather        unknown medical history   Family history: Family history reviewed and not pertinent.  Prior to Admission medications   Medication Sig Start Date  End Date Taking? Authorizing Provider  amLODipine (NORVASC) 10 MG tablet Take 1 tablet (10 mg total) by mouth every evening. 06/20/23   Iloabachie, Chioma E, NP  aspirin 81 MG tablet Take 1 tablet (81 mg total) by mouth daily. 11/01/17   Kallie Locks, FNP  blood glucose meter kit and supplies KIT Use as directed to check blood glucose 2-3 times per week and as needed 02/22/22   Tukov-Yual, Alroy Bailiff, NP  carvedilol (COREG) 25 MG tablet Take 1 tablet (25 mg total) by mouth 2 (two) times daily with a meal. 06/20/23   Iloabachie, Chioma E, NP  clopidogrel (PLAVIX) 75 MG tablet Take 1 tablet (75 mg total) by mouth daily. 06/20/23 06/19/24  Iloabachie, Chioma E, NP  folic acid (FOLVITE) 1 MG tablet Take 1 tablet (1 mg total) by mouth daily. 12/20/22   Iloabachie, Chioma E, NP  glucose blood (RIGHTEST GS550 BLOOD  GLUCOSE) test strip Use as directed to check blood glucose 2-3 times per week and as needed 02/22/22   Tukov-Yual, Magdalene S, NP  ibuprofen (ADVIL) 200 MG tablet Take 800 mg by mouth daily as needed for headache or moderate pain (usually takes daily for headaches, leg pain in am).    [provider]  levETIRAcetam (KEPPRA) 1000 MG tablet Take 1 tablet (1,000 mg total) by mouth 2 (two) times daily. 06/20/23   Iloabachie, Chioma E, NP  losartan (COZAAR) 100 MG tablet Take 1 tablet (100 mg total) by mouth in the morning. 03/07/23   Iloabachie, Chioma E, NP  magnesium oxide (MAG-OX) 400 MG tablet Take 1 tablet (400 mg total) by mouth daily. 07/12/22   Tukov-Yual, Alroy Bailiff, NP  pantoprazole (PROTONIX) 40 MG tablet Take 1 tablet (40 mg total) by mouth daily. 06/20/23   Iloabachie, Chioma E, NP  Rightest GL300 Lancets MISC Use as directed to check blood glucose 2-3 times per week and as needed 02/22/22   Tukov-Yual, Magdalene S, NP  rosuvastatin (CRESTOR) 40 MG tablet Take 1 tablet (40 mg total) by mouth daily. 04/25/23 07/28/23  Debbe Odea, MD  sildenafil (VIAGRA) 100 MG tablet Take 1 tablet 1 hour prior to intercourse. 07/02/22   Stoioff, Verna Czech, MD  thiamine (VITAMIN B-1) 100 MG tablet Take 1 tablet (100 mg total) by mouth daily. 12/20/22   Iloabachie, Chioma E, NP  vitamin B-12 (CYANOCOBALAMIN) 500 MCG tablet Take 2 tablets (1,000 mcg total) by mouth daily. 01/29/23   Eulogio Bear E, NP   Physical Exam: Vitals:   06/28/23 1120 06/28/23 1121 06/28/23 1520  BP: (!) 131/93    Pulse: 93    Resp: 20    Temp: 98.1 F (36.7 C)  98 F (36.7 C)  TempSrc:   Oral  SpO2: 100%    Weight:  96 kg   Height:  5\' 10"  (1.778 m)    Constitutional: appears age-appropriate, NAD, calm Eyes: PERRL, lids and conjunctivae normal ENMT: Mucous membranes are dry. Posterior pharynx clear of any exudate or lesions. Age-appropriate dentition. Hearing appropriate Neck: normal, supple, no masses, no  thyromegaly Respiratory: clear to auscultation bilaterally, no wheezing, no crackles. Normal respiratory effort. No accessory muscle use.  Cardiovascular: Regular rate and rhythm, no murmurs / rubs / gallops. No extremity edema. 2+ pedal pulses. No carotid bruits.  Abdomen: Obese abdomen, no tenderness, no masses palpated, no hepatosplenomegaly. Bowel sounds positive.  Musculoskeletal: no clubbing / cyanosis. No joint deformity upper and lower extremities. Good ROM, no contractures, no atrophy. Normal muscle tone.  Skin: no rashes, lesions, ulcers. No induration Neurologic: Sensation intact. Strength 5/5 in all 4.  Psychiatric: Normal judgment and insight. Alert and oriented x 3.  Depressed mood.  Flat affect  EKG: independently reviewed, showing sinus rhythm with rate of 91, QTc 442  Chest x-ray on Admission: I personally reviewed and I agree with radiologist reading as below.  US ABDOMEN LIMITED RUQ (LIVER/GB)  Result Date: 06/28/2023 CLINICAL DATA:  Right upper quadrant pain for 2 days EXAM: ULTRASOUND ABDOMEN LIMITED RIGHT UPPER QUADRANT COMPARISON:  Ultrasound 02/21/2023 FINDINGS: Gallbladder: Mildly distended gallbladder. No shadowing stones. There is mild gallbladder wall thickening of 5 mm of uncertain etiology. There is also a hypoechoic area along the margin of the gallbladder measuring 19 x 7 x 5 mm. Underlying lesion is possible. No blood flow to this area on Doppler. Common bile duct: Diameter: 4 mm Liver: Diffusely echogenic hepatic parenchyma consistent with fatty liver infiltration. Portal vein is patent on color Doppler imaging with normal direction of blood flow towards the liver. Other: None. IMPRESSION: No gallstones. However gallbladder wall has a diffuse thickening with a focal nodular area measuring up to 19 by 7 x 5 mm. Underlying lesion is possible. Please correlate with any prior or follow up MRI with and without contrast when appropriate to further delineate. No biliary  ductal dilatation.  Fatty liver infiltration. Electronically Signed   By: Karen Kays M.D.   On: 06/28/2023 15:06   DG Chest 2 View  Result Date: 06/28/2023 CLINICAL DATA:  Shortness of breath EXAM: CHEST - 2 VIEW COMPARISON:  None Available. FINDINGS: The heart size and mediastinal contours are within normal limits. No consolidation, pneumothorax or effusion. No edema. The visualized skeletal structures are unremarkable. IMPRESSION: No acute cardiopulmonary disease. Electronically Signed   By: Karen Kays M.D.   On: 06/28/2023 12:49    Labs on Admission: I have personally reviewed following labs  CBC: Recent Labs  Lab 06/28/23 1123  WBC 17.5*  HGB 9.1*  HCT 26.6*  MCV 94.3  PLT 166   Basic Metabolic Panel: Recent Labs  Lab 06/28/23 1123 06/28/23 1232  NA 132*  --   K 4.0  --   CL 98  --   CO2 20*  --   GLUCOSE 140*  --   BUN 34*  --   CREATININE 4.18*  --   CALCIUM 9.1  --   MG  --  2.3   GFR: Estimated Creatinine Clearance: 24.3 mL/min (A) (by C-G formula based on SCr of 4.18 mg/dL (H)).  Liver Function Tests: Recent Labs  Lab 06/28/23 1123  AST 98*  ALT 76*  ALKPHOS 156*  BILITOT 1.9*  PROT 7.9  ALBUMIN 3.3*   Coagulation Profile: Recent Labs  Lab 06/28/23 1232  INR 1.2   Cardiac Enzymes: Recent Labs  Lab 06/28/23 1232  CKTOTAL 109   Urine analysis:    Component Value Date/Time   COLORURINE YELLOW (A) 06/29/2017 1045   APPEARANCEUR Turbid (A) 11/05/2018 0000   LABSPEC 1.019 06/29/2017 1045   PHURINE 5.0 06/29/2017 1045   GLUCOSEU Negative 11/05/2018 0000   HGBUR NEGATIVE 06/29/2017 1045   BILIRUBINUR Negative 11/05/2018 0000   KETONESUR NEGATIVE 06/29/2017 1045   PROTEINUR Negative 11/05/2018 0000   PROTEINUR NEGATIVE 06/29/2017 1045   NITRITE Negative 11/05/2018 0000   NITRITE NEGATIVE 06/29/2017 1045   LEUKOCYTESUR Negative 11/05/2018 0000   This document was prepared using Dragon Voice Recognition software and may include  unintentional dictation errors.  Dr.  Jedd Schulenburg [facility]  If 7PM-7AM, please contact overnight-coverage provider If 7AM-7PM, please contact day attending provider [email or web address]  06/28/2023, 4:30 PM

## 2023-06-28 NOTE — Assessment & Plan Note (Signed)
Suspect secondary to alcohol abuse however as patient had right upper quadrant abdominal discomfort with EDP, RUQ abd [place] ordered by EDP Admit to telemetry cardiac, inpatient

## 2023-06-28 NOTE — Assessment & Plan Note (Addendum)
Calculated creatinine clearance is 13 mL/min on admission Patient on Keppra 1000 mg p.o. twice daily Keppra reduced dosing to 500 mg p.o. twice daily in setting of acute kidney injury AM team to resume home Keppra dosing when the benefits outweigh the risk

## 2023-06-28 NOTE — Assessment & Plan Note (Addendum)
With elevated alk phos Focal nodular measuring up to 19 mm x 7 mm x 5 mm on RUQ [place], which was not seen on RUQ [place] in 02/21/23, increased risk of carcinoma given the size read however before surgical intervention is considered, I would like further imaging to further delineate the lesion MRCP with and without contrast has been deferred on admission due to acute kidney injury AM team to order MRCP with and without contrast pending renal function

## 2023-06-28 NOTE — Assessment & Plan Note (Addendum)
Workup in progress, no definitive source of infection at this time and lactic acid not elevated. No abx at this time RUQ abdominal ultrasound pending read and UA ordered

## 2023-06-28 NOTE — Assessment & Plan Note (Signed)
Mild, asymptomatic Sodium bicarbonate 325 mg p.o. twice daily, 2 doses ordered Check BMP in a.m.

## 2023-06-28 NOTE — ED Provider Notes (Signed)
[facility] Provider Note    Event Date/Time   First MD Initiated Contact with Patient 06/28/23 1145     (approximate)   History   Shortness of Breath   HPI  Gary [name] is a 52 y.o. male past medical history significant for hypertension, hyperlipidemia, seizure disorder, CVA, TIA, alcohol use disorder, tobacco use, GERD, presents to the emergency department with generalized weakness and dizziness.  States that he has been feeling dizzy since Tuesday.  When he goes to stand up feels very dizzy like he might pass out.  Felt like he might have a seizure because he was dizzy.  Denies any recent seizure-like activity.  Endorses some shortness of breath.  Denies any significant cough or sputum production.  Denies any chest pain.  Fall yesterday with landing on his knees.  Denies any significant back or abdominal trauma.  Denies any blood in his stool or melanotic stool.  Does endorse daily alcohol use and last drink of alcohol was last week.  Denies any history of cirrhosis.  Denies taking anticoagulation.     Physical Exam   Triage Vital Signs: ED Triage Vitals  Encounter Vitals Group     BP 06/28/23 1120 (!) 131/93     Systolic BP Percentile --      Diastolic BP Percentile --      Pulse Rate 06/28/23 1120 93     Resp 06/28/23 1120 20     Temp 06/28/23 1120 98.1 F (36.7 C)     Temp src --      SpO2 06/28/23 1120 100 %     Weight 06/28/23 1121 211 lb 10.3 oz (96 kg)     Height 06/28/23 1121 5\' 10"  (1.778 m)     Head Circumference --      Peak Flow --      Pain Score 06/28/23 1121 0     Pain Loc --      Pain Education --      Exclude from Growth Chart --     Most recent vital signs: Vitals:   06/28/23 1120  BP: (!) 131/93  Pulse: 93  Resp: 20  Temp: 98.1 F (36.7 C)  SpO2: 100%    Physical Exam Constitutional:      Appearance: He is well-developed. He is obese.  HENT:     Head: Atraumatic.  Eyes:     Conjunctiva/sclera: Conjunctivae  normal.     Comments: Mild scleral icterus  Cardiovascular:     Rate and Rhythm: Regular rhythm.  Pulmonary:     Effort: Tachypnea and respiratory distress present.     Breath sounds: No decreased breath sounds, wheezing, rhonchi or rales.  Chest:     Chest wall: No tenderness.  Abdominal:     Tenderness: There is abdominal tenderness.     Comments: No obvious fluid wave.  Mild right upper quadrant abdominal tenderness to palpation with negative Murphy sign.  No rebound or guarding.  Genitourinary:    Comments: No gross blood or melena Musculoskeletal:     Cervical back: Normal range of motion.     Right lower leg: No edema.     Left lower leg: No edema.  Skin:    General: Skin is warm.     Capillary Refill: Capillary refill takes 2 to 3 seconds.  Neurological:     Mental Status: He is alert. Mental status is at baseline.     IMPRESSION / MDM / ASSESSMENT AND PLAN /  ED COURSE  I reviewed the triage vital signs and the nursing notes.  Differential diagnosis including dehydration, anemia, peptic ulcer disease, pneumonia, ACS, urinary tract infection, infectious process  On chart review patient has a history of significant alcohol use disorder.  Patient does endorse last drink of alcohol was yesterday.  Lab work was significant acute kidney injury, my evaluation patient with a soft blood pressure of 90 systolic.  Will obtain 1 blood culture and further workup for an infectious process.  EKG  I, Corena Herter, the attending physician, personally viewed and interpreted this ECG.   Rate: Normal  Rhythm: Normal sinus  Axis: Normal  Intervals: Normal  ST&T Change: None.  Isolated T wave inverted to lead III  No tachycardic or bradycardic dysrhythmias while on cardiac telemetry.  RADIOLOGY I independently reviewed imaging, my interpretation of imaging: Chest x-ray with no signs of pneumonia no signs of cardiomegaly or pulmonary edema.  LABS (all labs ordered are listed, but  only abnormal results are displayed) Labs interpreted as -    Labs Reviewed  CBC - Abnormal; Notable for the following components:      Result Value   WBC 17.5 (*)    RBC 2.82 (*)    Hemoglobin 9.1 (*)    HCT 26.6 (*)    All other components within normal limits  COMPREHENSIVE METABOLIC PANEL - Abnormal; Notable for the following components:   Sodium 132 (*)    CO2 20 (*)    Glucose, Bld 140 (*)    BUN 34 (*)    Creatinine, Ser 4.18 (*)    Albumin 3.3 (*)    AST 98 (*)    ALT 76 (*)    Alkaline Phosphatase 156 (*)    Total Bilirubin 1.9 (*)    GFR, Estimated 16 (*)    All other components within normal limits  PROTIME-INR - Abnormal; Notable for the following components:   Prothrombin Time 15.7 (*)    All other components within normal limits  CULTURE, BLOOD (SINGLE)  ETHANOL  CK  MAGNESIUM  LACTIC ACID, PLASMA  APTT  BRAIN NATRIURETIC PEPTIDE  LACTIC ACID, PLASMA  URINALYSIS, W/ REFLEX TO CULTURE (INFECTION SUSPECTED)  TYPE AND SCREEN  TROPONIN I (HIGH SENSITIVITY)     MDM  Patient does have leukocytosis but no other source of an infectious process.  Patient has anemia with a drop of hemoglobin when compared to prior, normocytic.  No signs or symptoms of a GI bleed at this time.  Was given a dose of IV Protonix given history of alcohol use.  Typed and screened.  No signs of rhabdomyolysis.  Elevation of LFTs.  Troponin negative have low suspicion for ACS.  Normal lactic acid.  Given IV fluids.  No signs of volume overload.  No signs of heart failure.  No headache, denies any falls or head trauma, have a low suspicion for intracranial hemorrhage or infarction.  Consulted and discussed the patient's case with Dr. Sedalia Muta, patient admitted for acute on chronic kidney injury.      PROCEDURES:  Critical Care performed: no  Procedures  Patient's presentation is most consistent with acute presentation with potential threat to life or bodily function.   MEDICATIONS  ORDERED IN ED: Medications  acetaminophen (TYLENOL) tablet 650 mg (has no administration in time range)    Or  acetaminophen (TYLENOL) suppository 650 mg (has no administration in time range)  ondansetron (ZOFRAN) tablet 4 mg (has no administration in time range)  Or  ondansetron (ZOFRAN) injection 4 mg (has no administration in time range)  heparin injection 5,000 Units (has no administration in time range)  senna-docusate (Senokot-S) tablet 1 tablet (has no administration in time range)  0.9 %  sodium chloride infusion (has no administration in time range)  LORazepam (ATIVAN) injection 2 mg (has no administration in time range)  nicotine (NICODERM CQ - dosed in mg/24 hours) patch 21 mg (has no administration in time range)  levETIRAcetam (KEPPRA) tablet 500 mg (has no administration in time range)  melatonin tablet 5 mg (has no administration in time range)  pantoprazole (PROTONIX) 80 mg /NS 100 mL IVPB (0 mg Intravenous Stopped 06/28/23 1339)  sodium chloride 0.9 % bolus 1,000 mL (1,000 mLs Intravenous New Bag/Given 06/28/23 1323)    FINAL CLINICAL IMPRESSION(S) / ED DIAGNOSES   Final diagnoses:  SOB (shortness of breath)  AKI (acute kidney injury) (HCC)  Anemia, unspecified type     Rx / DC Orders   ED Discharge Orders     None        Note:  This document was prepared using Dragon voice recognition software and may include unintentional dictation errors.   Corena Herter, MD 06/28/23 1409

## 2023-06-28 NOTE — Assessment & Plan Note (Signed)
-  As needed nicotine patch ordered ?

## 2023-06-28 NOTE — Hospital Course (Addendum)
[name] is a 52 year old male with history of alcohol abuse, hypertension, hyperlipidemia, history of TIA, GERD, erectile dysfunction, tobacco use, history of seizure, who presents to the emergency department for chief concerns of shortness of breath and dizziness. 09/20: in ED, VSS, mild hyponatremia sodium 132, significantly elevated creatinine 4.18 w/ GFR 16, elevated LFT  AST 98 and ALT 76, Bili 1.9, ALP 156, BNP WNL, Troponin and CK WNL, WBC elevated 17.5, Hgb 9.1. CXR nonacute. Renal US normal no dilatation, Abd [place] (+)fatty liver, GB w/ diffuse wall thickening and focal nodular area measuring up to 19 by 7 x 5 mm concern for underlying lesion and advise compare w/ MRI. Given Protonix 80 mg IV one-time dose, sodium chloride 1 L bolus. Admitted to hospitalist service.  09/21: SOB improved, renal function worse Cr to 5.88, consulted to nephrology. Pt c/o scrotal pain, [place] (+)small bl hydrocele L varicocele L epididymal cyst - started tx for epididymitis given leukocytosis  09/22: early AM, scrotum apparently opened to drain - pt describes clear thin liquid, bandage appears serous fluid, no purulent, no erythema to skin - monitor. Cr worse, up to 8, nephrology following, continuing IV fluids.  09/23: Cr still worsening and may need to consider dialysis tomorrow, urology consult repeating US  Consultants:  Nephrology   Procedures: none      ASSESSMENT & PLAN:   Shortness of breath without respiratory failure  Seems likely to be respiratory compensation for metabolic acidosis d/t renal failure  Differentials would include COPD history of tobacco use  CXR personally reviewed, no concerns  Neg troponin, ACS unlikely    AKI (acute kidney injury) on CKD3a - worsening --> acute renal faillure  suspect multifactorial including prerenal in setting of poor p.o./fluid intake, NSAID use,  glomerulonephritis, rhabdomyolysis, infection related AKI from epididymitis.  No concerns on renal US   UDS neg CK WNL Monitor BMP  Strict I&O Nephrology following   Pending ANA, ANCA, C3, C4, kappa/lambda, PTH, protein electrophoresis NS continuous at 125 mL/h  May need HD tomorrow if still worsening   Gallbladder mass - nodular 19 mm x 7 mm x 5 mm on RUQ [place] Associated w/ elevation in Bili, AST, ALT, ALP Risk of gallbladder carcinoma given the size read  before surgical intervention is considered, need further imaging  MRCP with and without contrast has been deferred on admission due to acute kidney injury, but plan for this once pt can tolerate   Elevated liver enzymes Suspect secondary to gallbladder disease as above, complicated by alcohol abuse  Monitor CMP, LFT Hepatitis panel pending    History Seizures Calculated creatinine clearance is 13 mL/min on admission Patient on Keppra 1000 mg p.o. twice daily at home. Placed on Keppra reduced dosing to 500 mg p.o. twice daily in setting of acute kidney injury   Scrotal pain w/  Small bilateral hydrocele Left-sided varicocele 8 mm left epididymal cyst. Leukocytosis Ceftriaxone x1 + doxycycline 100 mg bid x10 days (FQ would cover enteric pathogens but pt not sexually active for some time, FQ renal dosing problematic, will give doxy) started 09/21 Check for GC/CT in urine --> neg  Frequent dressing changes Non-urgent urology consult, this does NOT appear c/w fournier's or other emergent illness   Hyponatremia - stable, mild Likely d/t renal failure  Follow BMP   Metabolic acidosis Mild, secondary to acute kidney injury Sodium bicarbonate 325 mg p.o. twice daily, 2 doses ordered   History CVA Statin held for now given elevation LFT  Plavix   HLD  04/25/2023, cardiologist discontinued Pravachol and started patient on Crestor 40 mg w/ improvement in cholesterol and AST/ALT.  Hold statin for now w/ elevated LFT   Essential HTN On home amlodipine, carvedilol   GERD  PPI  Insomnia Melatonin 5 mg nightly as needed for  sleep   Tobacco abuse As needed nicotine patch ordered      Obesity based on BMI: Body mass index is 30.37 kg/m.   DVT prophylaxis: heparin  IV fluids: NS continuous IV fluids  Nutrition: renal/carb Central lines / invasive devices: none  Code Status: FULL CODE ACP documentation reviewed: 06/29/23 and none on file in [facility]  TOC needs: none at this time Barriers to dispo / significant pending items: renal function improvement

## 2023-06-28 NOTE — Assessment & Plan Note (Addendum)
Mild, secondary to acute kidney injury Sodium bicarbonate 325 mg p.o. twice daily, 2 doses ordered

## 2023-06-28 NOTE — Assessment & Plan Note (Signed)
Etiology workup in progress, differentials is broad at this point given patient history of tobacco use and alcohol use and dependence Will check high sensitive troponin Right upper quadrant abdominal ultrasound ordered pending completion and read at the time of this dictation Continue sodium chloride infusion at 125 mL/h, 2 days ordered Check BMP in the a.m. Admit to telemetry cardiac, inpatient

## 2023-06-29 ENCOUNTER — Inpatient Hospital Stay: Payer: Medicaid Other

## 2023-06-29 LAB — URINALYSIS, W/ REFLEX TO CULTURE (INFECTION SUSPECTED)
Bilirubin Urine: NEGATIVE
Glucose, UA: NEGATIVE mg/dL
Ketones, ur: NEGATIVE mg/dL
Leukocytes,Ua: NEGATIVE
Nitrite: NEGATIVE
Protein, ur: 100 mg/dL — AB
Specific Gravity, Urine: 1.01 (ref 1.005–1.030)
pH: 5 (ref 5.0–8.0)

## 2023-06-29 LAB — URINE DRUG SCREEN, QUALITATIVE (ARMC ONLY)
Amphetamines, Ur Screen: NOT DETECTED
Barbiturates, Ur Screen: NOT DETECTED
Benzodiazepine, Ur Scrn: NOT DETECTED
Cannabinoid 50 Ng, Ur ~~LOC~~: NOT DETECTED
Cocaine Metabolite,Ur ~~LOC~~: NOT DETECTED
MDMA (Ecstasy)Ur Screen: NOT DETECTED
Methadone Scn, Ur: NOT DETECTED
Opiate, Ur Screen: NOT DETECTED
Phencyclidine (PCP) Ur S: NOT DETECTED
Tricyclic, Ur Screen: NOT DETECTED

## 2023-06-29 LAB — BASIC METABOLIC PANEL
Anion gap: 12 (ref 5–15)
BUN: 46 mg/dL — ABNORMAL HIGH (ref 6–20)
CO2: 16 mmol/L — ABNORMAL LOW (ref 22–32)
Calcium: 7.8 mg/dL — ABNORMAL LOW (ref 8.9–10.3)
Chloride: 104 mmol/L (ref 98–111)
Creatinine, Ser: 5.88 mg/dL — ABNORMAL HIGH (ref 0.61–1.24)
GFR, Estimated: 11 mL/min — ABNORMAL LOW (ref >60)
Glucose, Bld: 101 mg/dL — ABNORMAL HIGH (ref 70–99)
Potassium: 4.1 mmol/L (ref 3.5–5.1)
Sodium: 132 mmol/L — ABNORMAL LOW (ref 135–145)

## 2023-06-29 LAB — CHLAMYDIA/NGC RT PCR (ARMC ONLY)
Chlamydia Tr: NOT DETECTED
N gonorrhoeae: NOT DETECTED

## 2023-06-29 LAB — CBC
HCT: 23.5 % — ABNORMAL LOW (ref 39.0–52.0)
Hemoglobin: 8.1 g/dL — ABNORMAL LOW (ref 13.0–17.0)
MCH: 32.3 pg (ref 26.0–34.0)
MCHC: 34.5 g/dL (ref 30.0–36.0)
MCV: 93.6 fL (ref 80.0–100.0)
Platelets: 144 10*3/uL — ABNORMAL LOW (ref 150–400)
RBC: 2.51 MIL/uL — ABNORMAL LOW (ref 4.22–5.81)
RDW: 12.4 % (ref 11.5–15.5)
WBC: 16.6 10*3/uL — ABNORMAL HIGH (ref 4.0–10.5)
nRBC: 0 % (ref 0.0–0.2)

## 2023-06-29 LAB — HEPATIC FUNCTION PANEL
ALT: 58 U/L — ABNORMAL HIGH (ref 0–44)
AST: 70 U/L — ABNORMAL HIGH (ref 15–41)
Albumin: 2.7 g/dL — ABNORMAL LOW (ref 3.5–5.0)
Alkaline Phosphatase: 139 U/L — ABNORMAL HIGH (ref 38–126)
Bilirubin, Direct: 0.7 mg/dL — ABNORMAL HIGH (ref 0.0–0.2)
Indirect Bilirubin: 1 mg/dL — ABNORMAL HIGH (ref 0.3–0.9)
Total Bilirubin: 1.7 mg/dL — ABNORMAL HIGH (ref 0.3–1.2)
Total Protein: 6.8 g/dL (ref 6.5–8.1)

## 2023-06-29 LAB — SODIUM, URINE, RANDOM: Sodium, Ur: 34 mmol/L

## 2023-06-29 LAB — SARS CORONAVIRUS 2 BY RT PCR: SARS Coronavirus 2 by RT PCR: NEGATIVE

## 2023-06-29 LAB — CK: Total CK: 106 U/L (ref 49–397)

## 2023-06-29 LAB — PROTEIN / CREATININE RATIO, URINE
Creatinine, Urine: 115 mg/dL
Protein Creatinine Ratio: 2.23 mg/mg{Cre} — ABNORMAL HIGH (ref 0.00–0.15)
Total Protein, Urine: 256 mg/dL

## 2023-06-29 LAB — CREATININE, URINE, RANDOM: Creatinine, Urine: 111 mg/dL

## 2023-06-29 MED ORDER — NYSTATIN 100000 UNIT/GM EX POWD
Freq: Two times a day (BID) | CUTANEOUS | Status: DC
  Filled 2023-06-29: qty 15

## 2023-06-29 MED ORDER — CLOPIDOGREL BISULFATE 75 MG PO TABS
75.0000 mg | ORAL_TABLET | Freq: Every day | ORAL | Status: DC
  Administered 2023-06-29 – 2023-07-10 (×11): 75 mg via ORAL
  Filled 2023-06-29 (×12): qty 1

## 2023-06-29 MED ORDER — DOXYCYCLINE HYCLATE 100 MG PO TABS
100.0000 mg | ORAL_TABLET | Freq: Two times a day (BID) | ORAL | Status: DC
  Administered 2023-06-29 – 2023-07-07 (×16): 100 mg via ORAL
  Filled 2023-06-29 (×17): qty 1

## 2023-06-29 MED ORDER — CARVEDILOL 25 MG PO TABS
25.0000 mg | ORAL_TABLET | Freq: Two times a day (BID) | ORAL | Status: DC
  Administered 2023-06-29 – 2023-07-09 (×18): 25 mg via ORAL
  Filled 2023-06-29 (×20): qty 1

## 2023-06-29 MED ORDER — FOLIC ACID 1 MG PO TABS
1.0000 mg | ORAL_TABLET | Freq: Every day | ORAL | Status: DC
  Administered 2023-06-29 – 2023-07-10 (×11): 1 mg via ORAL
  Filled 2023-06-29 (×12): qty 1

## 2023-06-29 MED ORDER — ASPIRIN 81 MG PO TBEC
81.0000 mg | DELAYED_RELEASE_TABLET | Freq: Every day | ORAL | Status: DC
  Administered 2023-06-29 – 2023-07-10 (×11): 81 mg via ORAL
  Filled 2023-06-29 (×12): qty 1

## 2023-06-29 MED ORDER — PANTOPRAZOLE SODIUM 40 MG PO TBEC
40.0000 mg | DELAYED_RELEASE_TABLET | Freq: Every day | ORAL | Status: DC
  Administered 2023-06-29 – 2023-07-10 (×11): 40 mg via ORAL
  Filled 2023-06-29 (×12): qty 1

## 2023-06-29 MED ORDER — AMLODIPINE BESYLATE 10 MG PO TABS
10.0000 mg | ORAL_TABLET | Freq: Every evening | ORAL | Status: DC
  Administered 2023-06-29 – 2023-07-09 (×11): 10 mg via ORAL
  Filled 2023-06-29 (×11): qty 1

## 2023-06-29 MED ORDER — DEXTROSE 5 % IV SOLN
0.5000 g | Freq: Once | INTRAVENOUS | Status: AC
  Administered 2023-06-29: 0.5 g via INTRAVENOUS
  Filled 2023-06-29: qty 500

## 2023-06-29 NOTE — Consult Note (Signed)
[facility] Consult Note:06/29/23    Date of Admission:  06/28/2023           Reason for Consult:  AKI   Referring Provider: Sunnie Nielsen, DO Primary Care Provider: Rolm Gala, NP   History of Presenting Illness:  Gary [name] is a 52 y.o. male Medical problems of hypertension, hyperlipidemia, history of TIA, GERD, erectile dysfunction, alcohol abuse, tobacco use, history of seizure.  He presented to the emergency room for concerns of shortness of breath, dizziness, fatigue. He states for the past 2 weeks or so he has not been feeling well.  He is nonspecific about the symptoms.  No nausea or vomiting or diarrhea.  No leg edema.  He has subjective shortness of breath.  He felt feverish yesterday.  Denies any COVID contacts. Denies any problems with voiding.  No blood in the urine.   Reports drinking 3-4 alcoholic drinks daily.  Reports cigarette smoking.  Denies drug use or marijuana use. Nephrology consult has been requested for evaluation of AKI on chronic kidney disease. Baseline creatinine of 1.61 from 06/13/2023. Admit creatinine of 4.18 from 06/28/2023 which has further increased to 5.88 today. In addition patient this morning, reported to the nurse that he had pain and tenderness in his testicles.  This has been going on for about 2 weeks or so.  He denies gross hematuria. Patient reports taking ibuprofen every morning for the past many months for headaches and general body aches.  Review of Systems: Review of Systems  Constitutional:  Positive for malaise/fatigue.  HENT: Negative.    Eyes: Negative.   Respiratory: Negative.    Cardiovascular: Negative.   Gastrointestinal: Negative.   Genitourinary: Negative.        Testicular pain  Musculoskeletal: Negative.   Skin: Negative.   Neurological: Negative.   Endo/Heme/Allergies: Negative.   Psychiatric/Behavioral: Negative.      Past Medical History:  Diagnosis Date   Alcohol use     Carotid artery occlusion    GERD (gastroesophageal reflux disease)    Hypercholesteremia    Hypertension    Seizures (HCC)    Stroke (HCC)    TIA 12/29/20, re-admitted 01/14/21 for sequelae   Tobacco abuse     Social History   Tobacco Use   Smoking status: Every Day    Current packs/day: 0.25    Average packs/day: 0.3 packs/day for 34.0 years (8.5 ttl pk-yrs)    Types: Cigarettes   Smokeless tobacco: Never   Tobacco comments:    Pt reports he has thoughts of quitting, has cut back to 5 cigarettes per day  Vaping Use   Vaping status: Never Used  Substance Use Topics   Alcohol use: Yes    Alcohol/week: 21.0 standard drinks of alcohol    Types: 21 Standard drinks or equivalent per week    Comment: 3-4 mixed drinks daily, 03/07/23 patient states down to 2.5 drinks per day   Drug use: No    Family History  Problem Relation Age of Onset   Heart attack Mother    Hypertension Mother        pt reports mother has stent   Other Father        unknown medical history   Healthy Sister    Other Maternal Grandmother        unknown medical history   Other Maternal Grandfather 1       choked on a watermelon seed   Other Paternal Grandmother  unknown medical history   Other Paternal Grandfather        unknown medical history     OBJECTIVE: Blood pressure 113/65, pulse 81, temperature 98.2 F (36.8 C), temperature source Oral, resp. rate 20, height 5\' 10"  (1.778 m), weight 96 kg, SpO2 99%.  Physical Exam General Appearance: Well-appearing, sitting in the chair, no acute distress HEENT: Moist oral mucous membranes, anicteric sclera Neck: No thyromegaly, no masses Pulmonary: Normal breathing effort on room air, clear to auscultation Cardiac: Regular rhythm, normal rub or gallop Abdomen: Soft,Unspecific mild diffuse tenderness Extremities: No peripheral edema Musculoskeletal: No joint swellings or effusions. GU: Mild scrotal swelling, mild testicular tenderness.   Lab  Results Lab Results  Component Value Date   WBC 16.6 (H) 06/29/2023   HGB 8.1 (L) 06/29/2023   HCT 23.5 (L) 06/29/2023   MCV 93.6 06/29/2023   PLT 144 (L) 06/29/2023    Lab Results  Component Value Date   CREATININE 5.88 (H) 06/29/2023   BUN 46 (H) 06/29/2023   NA 132 (L) 06/29/2023   K 4.1 06/29/2023   CL 104 06/29/2023   CO2 16 (L) 06/29/2023    Lab Results  Component Value Date   ALT 58 (H) 06/29/2023   AST 70 (H) 06/29/2023   ALKPHOS 139 (H) 06/29/2023   BILITOT 1.7 (H) 06/29/2023     Microbiology: Recent Results (from the past 240 hour(s))  Blood culture (single)     Status: None (Preliminary result)   Collection Time: 06/28/23 12:22 PM   Specimen: BLOOD  Result Value Ref Range Status   Specimen Description BLOOD LEFT ANTECUBITAL  Final   Special Requests   Final    BOTTLES DRAWN AEROBIC AND ANAEROBIC Blood Culture adequate volume   Culture   Final    NO GROWTH < 24 HOURS Performed at [facility], [address], [place], [place] [address]    Report Status PENDING  Incomplete  SARS Coronavirus 2 by RT PCR (hospital order, performed in [facility] hospital lab) *cepheid single result test* Anterior Nasal Swab     Status: None   Collection Time: 06/29/23 12:15 AM   Specimen: Anterior Nasal Swab  Result Value Ref Range Status   SARS Coronavirus 2 by RT PCR NEGATIVE NEGATIVE Final    Comment: (NOTE) SARS-CoV-2 target nucleic acids are NOT DETECTED.  The SARS-CoV-2 RNA is generally detectable in upper and lower respiratory specimens during the acute phase of infection. The lowest concentration of SARS-CoV-2 viral copies this assay can detect is 250 copies / mL. A negative result does not preclude SARS-CoV-2 infection and should not be used as the sole basis for treatment or other patient management decisions.  A negative result may occur with improper specimen collection / handling, submission of specimen other than nasopharyngeal swab, presence of  viral mutation(s) within the areas targeted by this assay, and inadequate number of viral copies (<250 copies / mL). A negative result must be combined with clinical observations, patient history, and epidemiological information.  Fact Sheet for Patients:   RoadLapTop.co.za  Fact Sheet for Healthcare Providers: http://kim-miller.com/  This test is not yet approved or  cleared by the [place] FDA and has been authorized for detection and/or diagnosis of SARS-CoV-2 by FDA under an Emergency Use Authorization (EUA).  This EUA will remain in effect (meaning this test can be used) for the duration of the COVID-19 declaration under Section 564(b)(1) of the Act, 21 U.S.C. section 360bbb-3(b)(1), unless the authorization is terminated or  revoked sooner.  Performed at [facility], [address] [place], [place] [address]     Medications: Scheduled Meds:  amLODipine  10 mg Oral QPM   aspirin EC  81 mg Oral Daily   carvedilol  25 mg Oral BID WC   clopidogrel  75 mg Oral Daily   folic acid  1 mg Oral Daily   heparin  5,000 Units Subcutaneous Q8H   levETIRAcetam  500 mg Oral BID   nystatin   Topical BID   pantoprazole  40 mg Oral Daily   Continuous Infusions:  sodium chloride 125 mL/hr at 06/29/23 1000   PRN Meds:.acetaminophen **OR** acetaminophen, guaiFENesin-dextromethorphan, LORazepam, melatonin, nicotine, ondansetron **OR** ondansetron (ZOFRAN) IV, senna-docusate  No Known Allergies  Urinalysis: Recent Labs    06/29/23 1042  COLORURINE YELLOW*  LABSPEC 1.010  PHURINE 5.0  GLUCOSEU NEGATIVE  HGBUR MODERATE*  BILIRUBINUR NEGATIVE  KETONESUR NEGATIVE  PROTEINUR 100*  NITRITE NEGATIVE  LEUKOCYTESUR NEGATIVE      Imaging: US RENAL  Result Date: 06/28/2023 CLINICAL DATA:  Acute kidney injury EXAM: RENAL / URINARY TRACT ULTRASOUND COMPLETE COMPARISON:  None Available. FINDINGS: Right Kidney: Renal  measurements: 11.4 x 5.0 x 5.6 cm = volume: 167.7 mL. Echogenicity within normal limits. No mass or hydronephrosis visualized. Left Kidney: Renal measurements: 12.2 x 6.9 x 6.5 cm = volume: 287.1 mL. Echogenicity within normal limits. No mass or hydronephrosis visualized. Bladder: Appears normal for degree of bladder distention. Other: None. IMPRESSION: No collecting system dilatation. Electronically Signed   By: Karen Kays M.D.   On: 06/28/2023 16:42   US ABDOMEN LIMITED RUQ (LIVER/GB)  Result Date: 06/28/2023 CLINICAL DATA:  Right upper quadrant pain for 2 days EXAM: ULTRASOUND ABDOMEN LIMITED RIGHT UPPER QUADRANT COMPARISON:  Ultrasound 02/21/2023 FINDINGS: Gallbladder: Mildly distended gallbladder. No shadowing stones. There is mild gallbladder wall thickening of 5 mm of uncertain etiology. There is also a hypoechoic area along the margin of the gallbladder measuring 19 x 7 x 5 mm. Underlying lesion is possible. No blood flow to this area on Doppler. Common bile duct: Diameter: 4 mm Liver: Diffusely echogenic hepatic parenchyma consistent with fatty liver infiltration. Portal vein is patent on color Doppler imaging with normal direction of blood flow towards the liver. Other: None. IMPRESSION: No gallstones. However gallbladder wall has a diffuse thickening with a focal nodular area measuring up to 19 by 7 x 5 mm. Underlying lesion is possible. Please correlate with any prior or follow up MRI with and without contrast when appropriate to further delineate. No biliary ductal dilatation.  Fatty liver infiltration. Electronically Signed   By: Karen Kays M.D.   On: 06/28/2023 15:06   DG Chest 2 View  Result Date: 06/28/2023 CLINICAL DATA:  Shortness of breath EXAM: CHEST - 2 VIEW COMPARISON:  None Available. FINDINGS: The heart size and mediastinal contours are within normal limits. No consolidation, pneumothorax or effusion. No edema. The visualized skeletal structures are unremarkable. IMPRESSION: No  acute cardiopulmonary disease. Electronically Signed   By: Karen Kays M.D.   On: 06/28/2023 12:49      Assessment/Plan:  Gary [name] is a 52 y.o. male with medical problems of hypertension, alcohol abuse, hyperlipidemia, history of TIA, GERD, erectile dysfunction, tobacco abuse, history of seizures  was admitted on 06/28/2023 for :  Shortness of breath [R06.02] SOB (shortness of breath) [R06.02] AKI (acute kidney injury) (HCC) [N17.9] Anemia, unspecified type [D64.9]  Acute kidney injury on chronic kidney disease stage IIIa. Hematuria Proteinuria. Elevated  liver enzymes Hypoalbuminemia Fatty liver Alcohol abuse Current smoker Epididymitis/scrotal tenderness  Baseline creatinine of 1.61, GFR 51 from 06/13/2023.  Presenting creatinine of 4.18, GFR 16.  In addition patient has elevated liver enzymes suggesting hepatitis possibly alcoholic hepatitis.  Albumin is low.  Diffuse echogenic liver consistent with fatty infiltration. Urinalysis with moderate blood, 0-5 RBCs, 0-5 WBCs, 100 mg of protein. Renal ultrasound is negative for obstruction, mass.  Differential diagnosis includes nonsteroidals induced AKI, FSGS, glomerulonephritis, rhabdomyolysis, infection related AKI from epididymitis.  Plan: Agree with IV fluid hydration to correct any underlying volume depletion. Consider scrotal ultrasound and empiric antibiotics for epididymitis Will obtain urine protein to creatinine ratio, screening serologies, CK. Supportive care for now.  Electrolytes and volume status are acceptable.  No acute indication for dialysis at present.    Colbin Jovel Thedore Mins 06/29/23

## 2023-06-29 NOTE — Plan of Care (Signed)
  Problem: Education: Goal: Knowledge of disease or condition will improve Outcome: Progressing   Problem: Self-Care: Goal: Ability to participate in self-care as condition permits will improve Outcome: Progressing   Problem: Education: Goal: Knowledge of General Education information will improve Description: Including pain rating scale, medication(s)/side effects and non-pharmacologic comfort measures Outcome: Progressing   Problem: Activity: Goal: Risk for activity intolerance will decrease Outcome: Progressing   Problem: Pain Managment: Goal: General experience of comfort will improve Outcome: Progressing   Problem: Safety: Goal: Ability to remain free from injury will improve Outcome: Progressing   Problem: Elimination: Goal: Will not experience complications related to bowel motility Outcome: Progressing Goal: Will not experience complications related to urinary retention Outcome: Progressing

## 2023-06-29 NOTE — Progress Notes (Signed)
PROGRESS NOTE    Gary [name]   NFA:[id number] DOB: 02/17/1971  DOA: 06/28/2023 Date of Service: 06/29/23 PCP: Gary Gala, NP     Brief Narrative / Hospital Course:  [name] is a 52 year old male with history of alcohol abuse, hypertension, hyperlipidemia, history of TIA, GERD, erectile dysfunction, tobacco use, history of seizure, who presents to the emergency department for chief concerns of shortness of breath and dizziness. 09/20: in ED, VSS, mild hyponatremia sodium 132, significantly elevated creatinine 4.18 w/ GFR 16, elevated LFT  AST 98 and ALT 76, Bili 1.9, ALP 156, BNP WNL, Troponin and CK WNL, WBC elevated 17.5, Hgb 9.1. CXR nonacute. Renal US normal no dilatation, Abd [place] (+)fatty liver, GB w/ diffuse wall thickening and focal nodular area measuring up to 19 by 7 x 5 mm concern for underlying lesion and advise compare w/ MRI. Given Protonix 80 mg IV one-time dose, sodium chloride 1 L bolus. Admitted to hospitalist service.  09/21: SOB improved, renal function worse Cr to 5.88, consulted to nephrology. Pt c/o scrotal pain which was not addressed in ED< [place] pending, starting tx for epididymitis given leukocytosis    Consultants:  Nephrology   Procedures: none      ASSESSMENT & PLAN:   Shortness of breath without respiratory failure  Seems likely to be respiratory compensation for metabolic acidosis d/t renal failure  Differentials would include COPD history of tobacco use  CXR personally reviewed, no concerns  Neg troponin, ACS unlikely   Gallbladder mass - nodular 19 mm x 7 mm x 5 mm on RUQ [place] Associated w/ elevation in Bili, AST, ALT, ALP Risk of gallbladder carcinoma given the size read  before surgical intervention is considered, need further imaging  MRCP with and without contrast has been deferred on admission due to acute kidney injury, but plan for this once pt can tolerate    AKI (acute kidney injury) on CKD3a - worsening  suspect  multifactorial including prerenal in setting of poor p.o./fluid intake, NSAID use,  glomerulonephritis, rhabdomyolysis, infection related AKI from epididymitis.  IV fluids No concerns on renal US  Monitor BMP in am 09/21 --> worse --> start sodium bicarb infusion Strict I&O Need urine specimen, RN aware to get one today asap Nephrology consult  Pending ANA, ANCA, C3, C4, kappa/lambda, PTH, protein electrophoresis NS continuous at 125 mL/h   Elevated liver enzymes Suspect secondary to gallbladder disease as above, complicated by alcohol abuse  Monitor CMP, LFT Hepatitis panel pending    History Seizures Calculated creatinine clearance is 13 mL/min on admission Patient on Keppra 1000 mg p.o. twice daily at home. Placed on Keppra reduced dosing to 500 mg p.o. twice daily in setting of acute kidney injury   Leukocytosis Scrotal pain  Given scrotal swelling concern for epididymitis  Ceftriaxone x1 + doxycycline 100 mg bid x10 days (FQ would cover enteric pathogens but pt not sexually active for some time, FQ renal dosing problematic, will give doxy)  Check for GC/CT in urine [place] pending    Hyponatremia Likely d/t renal failure  Sodium bicarbonate 325 mg p.o. twice daily, 2 doses ordered on admission 09/20 Check BMP in a.m. 09/21 --> same --> work on correcting renal dysfuction, will start sodium bicarb infusion since renal function worse    Metabolic acidosis Mild, secondary to acute kidney injury Sodium bicarbonate 325 mg p.o. twice daily, 2 doses ordered   History CVA Statin held for now given elevation LFT  Plavix   HLD  04/25/2023, cardiologist discontinued Pravachol and started patient on Crestor 40 mg w/ improvement in cholesterol and AST/ALT.  Hold statin for now w/ elevated LFT   Essential HTN On home amlodipine, carvedilol   GERD  PPI  Insomnia Melatonin 5 mg nightly as needed for sleep   Tobacco abuse As needed nicotine patch ordered      Obesity based  on BMI: Body mass index is 30.37 kg/m.   DVT prophylaxis: heparin  IV fluids: NS continuous IV fluids  Nutrition: renal/carb Central lines / invasive devices: none  Code Status: FULL CODE ACP documentation reviewed: 06/29/23 and none on file in [facility]  TOC needs: none at this time Barriers to dispo / significant pending items: renal function improvement               Subjective / Brief ROS:  Patient reports scrotal pain about the same - no penile discharge or burning, no rash, not sexually active for some time, reports he is not at risk for STI  Denies CP/SOB, breathing today feels better Pain controlled.  Denies new weakness.  Tolerating diet.    Family Communication: none at this time     Objective Findings:  Vitals:   06/28/23 2048 06/28/23 2336 06/29/23 0356 06/29/23 0810  BP: 122/64 118/64 106/64 113/65  Pulse: 85 95 79 81  Resp: 18 18 20 20   Temp: 99.9 F (37.7 C) 100.2 F (37.9 C) 98.1 F (36.7 C) 98.2 F (36.8 C)  TempSrc: Oral Oral  Oral  SpO2: 100% 97% 96% 99%  Weight:      Height:        Intake/Output Summary (Last 24 hours) at 06/29/2023 1411 Last data filed at 06/29/2023 1000 Gross per 24 hour  Intake 2077.82 ml  Output 100 ml  Net 1977.82 ml   Filed Weights   06/28/23 1121  Weight: 96 kg    Examination:  Physical Exam Constitutional:      General: He is not in acute distress. Cardiovascular:     Rate and Rhythm: Normal rate and regular rhythm.  Pulmonary:     Effort: Pulmonary effort is normal.     Breath sounds: Normal breath sounds.  Musculoskeletal:     Right lower leg: No edema.     Left lower leg: No edema.  Neurological:     General: No focal deficit present.     Mental Status: He is alert and oriented to person, place, and time.  Psychiatric:        Mood and Affect: Mood normal.        Behavior: Behavior normal.          Scheduled Medications:   amLODipine  10 mg Oral QPM   aspirin EC  81 mg Oral Daily    carvedilol  25 mg Oral BID WC   clopidogrel  75 mg Oral Daily   doxycycline  100 mg Oral Q12H   folic acid  1 mg Oral Daily   heparin  5,000 Units Subcutaneous Q8H   levETIRAcetam  500 mg Oral BID   nystatin   Topical BID   pantoprazole  40 mg Oral Daily    Continuous Infusions:  sodium chloride 125 mL/hr at 06/29/23 1000   cefTRIAXone (ROCEPHIN)  IV      PRN Medications:  acetaminophen **OR** acetaminophen, guaiFENesin-dextromethorphan, LORazepam, melatonin, nicotine, ondansetron **OR** ondansetron (ZOFRAN) IV, senna-docusate  Antimicrobials from admission:  Anti-infectives (From admission, onward)    Start     Dose/Rate Route Frequency  Ordered Stop   06/29/23 1500  cefTRIAXone (ROCEPHIN) 0.5 g in dextrose 5 % 50 mL IVPB        0.5 g 100 mL/hr over 30 Minutes Intravenous  Once 06/29/23 1402     06/29/23 1500  doxycycline (VIBRA-TABS) tablet 100 mg        100 mg Oral Every 12 hours 06/29/23 1402 07/09/23 5277           Data Reviewed:  I have personally reviewed the following...  CBC: Recent Labs  Lab 06/28/23 1123 06/29/23 0613  WBC 17.5* 16.6*  HGB 9.1* 8.1*  HCT 26.6* 23.5*  MCV 94.3 93.6  PLT 166 144*   Basic Metabolic Panel: Recent Labs  Lab 06/28/23 1123 06/28/23 1232 06/29/23 0613  NA 132*  --  132*  K 4.0  --  4.1  CL 98  --  104  CO2 20*  --  16*  GLUCOSE 140*  --  101*  BUN 34*  --  46*  CREATININE 4.18*  --  5.88*  CALCIUM 9.1  --  7.8*  MG  --  2.3  --    GFR: Estimated Creatinine Clearance: 17.3 mL/min (A) (by C-G formula based on SCr of 5.88 mg/dL (H)). Liver Function Tests: Recent Labs  Lab 06/28/23 1123 06/29/23 0613  AST 98* 70*  ALT 76* 58*  ALKPHOS 156* 139*  BILITOT 1.9* 1.7*  PROT 7.9 6.8  ALBUMIN 3.3* 2.7*   No results for input(s): "LIPASE", "AMYLASE" in the last 168 hours. No results for input(s): "AMMONIA" in the last 168 hours. Coagulation Profile: Recent Labs  Lab 06/28/23 1232  INR 1.2   Cardiac  Enzymes: Recent Labs  Lab 06/28/23 1232 06/29/23 0613  CKTOTAL 109 106   BNP (last 3 results) No results for input(s): "PROBNP" in the last 8760 hours. HbA1C: No results for input(s): "HGBA1C" in the last 72 hours. CBG: No results for input(s): "GLUCAP" in the last 168 hours. Lipid Profile: No results for input(s): "CHOL", "HDL", "LDLCALC", "TRIG", "CHOLHDL", "LDLDIRECT" in the last 72 hours. Thyroid Function Tests: No results for input(s): "TSH", "T4TOTAL", "FREET4", "T3FREE", "THYROIDAB" in the last 72 hours. Anemia Panel: No results for input(s): "VITAMINB12", "FOLATE", "FERRITIN", "TIBC", "IRON", "RETICCTPCT" in the last 72 hours. Most Recent Urinalysis On File:     Component Value Date/Time   COLORURINE YELLOW (A) 06/29/2023 1042   APPEARANCEUR CLOUDY (A) 06/29/2023 1042   APPEARANCEUR Turbid (A) 11/05/2018 0000   LABSPEC 1.010 06/29/2023 1042   PHURINE 5.0 06/29/2023 1042   GLUCOSEU NEGATIVE 06/29/2023 1042   HGBUR MODERATE (A) 06/29/2023 1042   BILIRUBINUR NEGATIVE 06/29/2023 1042   BILIRUBINUR Negative 11/05/2018 0000   KETONESUR NEGATIVE 06/29/2023 1042   PROTEINUR 100 (A) 06/29/2023 1042   NITRITE NEGATIVE 06/29/2023 1042   LEUKOCYTESUR NEGATIVE 06/29/2023 1042   Sepsis Labs: @LABRCNTIP (procalcitonin:4,lacticidven:4) Microbiology: Recent Results (from the past 240 hour(s))  Blood culture (single)     Status: None (Preliminary result)   Collection Time: 06/28/23 12:22 PM   Specimen: BLOOD  Result Value Ref Range Status   Specimen Description BLOOD LEFT ANTECUBITAL  Final   Special Requests   Final    BOTTLES DRAWN AEROBIC AND ANAEROBIC Blood Culture adequate volume   Culture   Final    NO GROWTH < 24 HOURS Performed at [facility], [address] [place], [place] [address]    Report Status PENDING  Incomplete  SARS Coronavirus 2 by RT PCR (hospital order, performed in [facility] hospital lab) *cepheid single result test* Anterior Nasal Swab      Status: None   Collection Time: 06/29/23 12:15 AM   Specimen: Anterior Nasal Swab  Result Value Ref Range Status   SARS Coronavirus 2 by RT PCR NEGATIVE NEGATIVE Final    Comment: (NOTE) SARS-CoV-2 target nucleic acids are NOT DETECTED.  The SARS-CoV-2 RNA is generally detectable in upper and lower respiratory specimens during the acute phase of infection. The lowest concentration of SARS-CoV-2 viral copies this assay can detect is 250 copies / mL. A negative result does not preclude SARS-CoV-2 infection and should not be used as the sole basis for treatment or other patient management decisions.  A negative result may occur with improper specimen collection / handling, submission of specimen other than nasopharyngeal swab, presence of viral mutation(s) within the areas targeted by this assay, and inadequate number of viral copies (<250 copies / mL). A negative result must be combined with clinical observations, patient history, and epidemiological information.  Fact Sheet for Patients:   RoadLapTop.co.za  Fact Sheet for Healthcare Providers: http://kim-miller.com/  This test is not yet approved or  cleared by the [place] FDA and has been authorized for detection and/or diagnosis of SARS-CoV-2 by FDA under an Emergency Use Authorization (EUA).  This EUA will remain in effect (meaning this test can be used) for the duration of the COVID-19 declaration under Section 564(b)(1) of the Act, 21 U.S.C. section 360bbb-3(b)(1), unless the authorization is terminated or revoked sooner.  Performed at [facility], [address] [place], [place] [address]       Radiology Studies last 3 days: US RENAL  Result Date: 06/28/2023 CLINICAL DATA:  Acute kidney injury EXAM: RENAL / URINARY TRACT ULTRASOUND COMPLETE COMPARISON:  None Available. FINDINGS: Right Kidney: Renal measurements: 11.4 x 5.0 x 5.6 cm = volume: 167.7 mL.  Echogenicity within normal limits. No mass or hydronephrosis visualized. Left Kidney: Renal measurements: 12.2 x 6.9 x 6.5 cm = volume: 287.1 mL. Echogenicity within normal limits. No mass or hydronephrosis visualized. Bladder: Appears normal for degree of bladder distention. Other: None. IMPRESSION: No collecting system dilatation. Electronically Signed   By: Karen Kays M.D.   On: 06/28/2023 16:42   US ABDOMEN LIMITED RUQ (LIVER/GB)  Result Date: 06/28/2023 CLINICAL DATA:  Right upper quadrant pain for 2 days EXAM: ULTRASOUND ABDOMEN LIMITED RIGHT UPPER QUADRANT COMPARISON:  Ultrasound 02/21/2023 FINDINGS: Gallbladder: Mildly distended gallbladder. No shadowing stones. There is mild gallbladder wall thickening of 5 mm of uncertain etiology. There is also a hypoechoic area along the margin of the gallbladder measuring 19 x 7 x 5 mm. Underlying lesion is possible. No blood flow to this area on Doppler. Common bile duct: Diameter: 4 mm Liver: Diffusely echogenic hepatic parenchyma consistent with fatty liver infiltration. Portal vein is patent on color Doppler imaging with normal direction of blood flow towards the liver. Other: None. IMPRESSION: No gallstones. However gallbladder wall has a diffuse thickening with a focal nodular area measuring up to 19 by 7 x 5 mm. Underlying lesion is possible. Please correlate with any prior or follow up MRI with and without contrast when appropriate to further delineate. No biliary ductal dilatation.  Fatty liver infiltration. Electronically Signed   By: Karen Kays M.D.   On: 06/28/2023 15:06   DG Chest 2 View  Result Date: 06/28/2023 CLINICAL DATA:  Shortness of breath EXAM: CHEST - 2 VIEW COMPARISON:  None Available. FINDINGS: The heart size and mediastinal contours  are within normal limits. No consolidation, pneumothorax or effusion. No edema. The visualized skeletal structures are unremarkable. IMPRESSION: No acute cardiopulmonary disease. Electronically Signed    By: Karen Kays M.D.   On: 06/28/2023 12:49             LOS: 1 day    Time spent: 50 min    Sunnie Nielsen, DO [facility] 06/29/2023, 2:11 PM    Dictation software may have been used to generate the above note. Typos may occur and escape review in typed/dictated notes. Please contact Dr Lyn Hollingshead directly for clarity if needed.  Staff may message me via secure chat in Epic  but this may not receive an immediate response,  please page me for urgent matters!  If 7PM-7AM, please contact night coverage [email or web address]

## 2023-06-29 NOTE — Progress Notes (Signed)
CROSS COVER NOTE  NAME: Gary [name] MRN: [id number] DOB : 02-16-1971    Concern as stated by nurse / staff   Requst review for transfer off cardiac progressive     Pertinent findings on chart review: Cardiac cause for dizziness shortness of breath ruled out. No longer on tele. Current work up for gallbladder mass, and acute renal failure as well as epidydimitis. Scrotal ultrasound 1636 pm showing 1. No evidence of testicular torsion. 2. Small bilateral hydroceles. 3. Left-sided varicocele. 4. 8 mm left epididymal cyst.  Assessment and  Interventions   Assessment:    06/29/2023    8:10 AM 06/29/2023    3:56 AM 06/28/2023   11:36 PM  Vitals with BMI  Systolic 113 106 169  Diastolic 65 64 64  Pulse 81 79 95   Temp                      98.2 Plan: Transfer to medsurg       Donnie Mesa NP [facility] Cross Cover 7pm-7am - check amion for availability Pager [phone]

## 2023-06-29 NOTE — Progress Notes (Signed)
CROSS COVER NOTE  NAME: Gary [name] MRN: [id number] DOB : 12-Dec-1970    Concern as stated by nurse / staff    "Has a cough, smoker, and would like cough syrup to help him sleep.  uh, he also has temp of 100.2. Want swab for covid?"   Pertinent findings on chart review: Patient admitted with CC shortness of breath and dizziness. Workup revealing for AKI, gallbladder mass, leukocytosis and transaminitis 9not new, hepatitis panel negative 01/2023)   Assessment and  Interventions   Assessment:    06/28/2023   11:36 PM 06/28/2023    8:48 PM 06/28/2023    4:45 PM  Vitals with BMI  Systolic 118 122 324  Diastolic 64 64 71  Pulse 95 85 83    Plan: Covid screen ordered       Donnie Mesa NP [facility] Cross Cover 7pm-7am - check amion for availability Pager [phone]

## 2023-06-30 LAB — HEPATITIS B SURFACE ANTIBODY,QUALITATIVE: Hep B S Ab: NONREACTIVE

## 2023-06-30 LAB — COMPREHENSIVE METABOLIC PANEL
ALT: 57 U/L — ABNORMAL HIGH (ref 0–44)
AST: 77 U/L — ABNORMAL HIGH (ref 15–41)
Albumin: 2.6 g/dL — ABNORMAL LOW (ref 3.5–5.0)
Alkaline Phosphatase: 157 U/L — ABNORMAL HIGH (ref 38–126)
Anion gap: 11 (ref 5–15)
BUN: 60 mg/dL — ABNORMAL HIGH (ref 6–20)
CO2: 15 mmol/L — ABNORMAL LOW (ref 22–32)
Calcium: 7.7 mg/dL — ABNORMAL LOW (ref 8.9–10.3)
Chloride: 107 mmol/L (ref 98–111)
Creatinine, Ser: 8.06 mg/dL — ABNORMAL HIGH (ref 0.61–1.24)
GFR, Estimated: 7 mL/min — ABNORMAL LOW (ref >60)
Glucose, Bld: 116 mg/dL — ABNORMAL HIGH (ref 70–99)
Potassium: 4 mmol/L (ref 3.5–5.1)
Sodium: 133 mmol/L — ABNORMAL LOW (ref 135–145)
Total Bilirubin: 1.3 mg/dL — ABNORMAL HIGH (ref 0.3–1.2)
Total Protein: 7 g/dL (ref 6.5–8.1)

## 2023-06-30 LAB — HEPATITIS B SURFACE ANTIGEN: Hepatitis B Surface Ag: NONREACTIVE

## 2023-06-30 NOTE — Progress Notes (Addendum)
[facility]  ROUNDING NOTE   Subjective:   Patient seen resting in bed Alert and oriented Continues to report tenderness from scrotal area Room air Appetite fair  Creatinine 8.06  Objective:  Vital signs in last 24 hours:  Temp:  [98.1 F (36.7 C)-99.9 F (37.7 C)] 99.1 F (37.3 C) (09/22 1151) Pulse Rate:  [67-91] 67 (09/22 1151) Resp:  [16-18] 18 (09/22 1151) BP: (98-119)/(60-71) 106/68 (09/22 1151) SpO2:  [94 %-98 %] 98 % (09/22 1151)  Weight change:  Filed Weights   06/28/23 1121  Weight: 96 kg    Intake/Output: I/O last 3 completed shifts: In: 3977.3 [P.O.:360; I.V.:3596.8; IV Piggyback:20.4] Out: 100 [Urine:100]   Intake/Output this shift:  Total I/O In: 120 [P.O.:120] Out: -   Physical Exam: General: NAD  Head: Normocephalic, atraumatic. Moist oral mucosal membranes  Eyes: Anicteric  Lungs:  Clear to auscultation normal effort  Heart: Regular rate and rhythm  Abdomen:  Soft, nontender,   Extremities: No peripheral edema.  Neurologic: Nonfocal, moving all four extremities  Skin: No lesions.  Scrotal edema and tenderness  Access: None    Basic Metabolic Panel: Recent Labs  Lab 06/28/23 1123 06/28/23 1232 06/29/23 0613 06/30/23 0626  NA 132*  --  132* 133*  K 4.0  --  4.1 4.0  CL 98  --  104 107  CO2 20*  --  16* 15*  GLUCOSE 140*  --  101* 116*  BUN 34*  --  46* 60*  CREATININE 4.18*  --  5.88* 8.06*  CALCIUM 9.1  --  7.8* 7.7*  MG  --  2.3  --   --     Liver Function Tests: Recent Labs  Lab 06/28/23 1123 06/29/23 0613 06/30/23 0626  AST 98* 70* 77*  ALT 76* 58* 57*  ALKPHOS 156* 139* 157*  BILITOT 1.9* 1.7* 1.3*  PROT 7.9 6.8 7.0  ALBUMIN 3.3* 2.7* 2.6*   No results for input(s): "LIPASE", "AMYLASE" in the last 168 hours. No results for input(s): "AMMONIA" in the last 168 hours.  CBC: Recent Labs  Lab 06/28/23 1123 06/29/23 0613  WBC 17.5* 16.6*  HGB 9.1* 8.1*  HCT 26.6* 23.5*  MCV 94.3 93.6  PLT 166  144*    Cardiac Enzymes: Recent Labs  Lab 06/28/23 1232 06/29/23 0613  CKTOTAL 109 106    BNP: Invalid input(s): "POCBNP"  CBG: No results for input(s): "GLUCAP" in the last 168 hours.  Microbiology: Results for orders placed or performed during the hospital encounter of 06/28/23  Blood culture (single)     Status: None (Preliminary result)   Collection Time: 06/28/23 12:22 PM   Specimen: BLOOD  Result Value Ref Range Status   Specimen Description BLOOD LEFT ANTECUBITAL  Final   Special Requests   Final    BOTTLES DRAWN AEROBIC AND ANAEROBIC Blood Culture adequate volume   Culture   Final    NO GROWTH 2 DAYS Performed at [facility], [address], [place], [place] [address]    Report Status PENDING  Incomplete  SARS Coronavirus 2 by RT PCR (hospital order, performed in [facility] hospital lab) *cepheid single result test* Anterior Nasal Swab     Status: None   Collection Time: 06/29/23 12:15 AM   Specimen: Anterior Nasal Swab  Result Value Ref Range Status   SARS Coronavirus 2 by RT PCR NEGATIVE NEGATIVE Final    Comment: (NOTE) SARS-CoV-2 target nucleic acids are NOT DETECTED.  The SARS-CoV-2 RNA is  generally detectable in upper and lower respiratory specimens during the acute phase of infection. The lowest concentration of SARS-CoV-2 viral copies this assay can detect is 250 copies / mL. A negative result does not preclude SARS-CoV-2 infection and should not be used as the sole basis for treatment or other patient management decisions.  A negative result may occur with improper specimen collection / handling, submission of specimen other than nasopharyngeal swab, presence of viral mutation(s) within the areas targeted by this assay, and inadequate number of viral copies (<250 copies / mL). A negative result must be combined with clinical observations, patient history, and epidemiological information.  Fact Sheet for Patients:    RoadLapTop.co.za  Fact Sheet for Healthcare Providers: http://kim-miller.com/  This test is not yet approved or  cleared by the [place] FDA and has been authorized for detection and/or diagnosis of SARS-CoV-2 by FDA under an Emergency Use Authorization (EUA).  This EUA will remain in effect (meaning this test can be used) for the duration of the COVID-19 declaration under Section 564(b)(1) of the Act, 21 U.S.C. section 360bbb-3(b)(1), unless the authorization is terminated or revoked sooner.  Performed at [facility], [address] [place], [place] [address]   Chlamydia/NGC rt PCR [facility] only)     Status: None   Collection Time: 06/29/23 10:42 AM   Specimen: Urine; GU  Result Value Ref Range Status   Specimen source GC/Chlam URINE, RANDOM  Final   Chlamydia Tr NOT DETECTED NOT DETECTED Final   N gonorrhoeae NOT DETECTED NOT DETECTED Final    Comment: (NOTE) This CT/NG assay has not been evaluated in patients with a history of  hysterectomy. Performed at [facility], [address] [place], [place] [address]     Coagulation Studies: Recent Labs    06/28/23 1232  LABPROT 15.7*  INR 1.2    Urinalysis: Recent Labs    06/29/23 1042  COLORURINE YELLOW*  LABSPEC 1.010  PHURINE 5.0  GLUCOSEU NEGATIVE  HGBUR MODERATE*  BILIRUBINUR NEGATIVE  KETONESUR NEGATIVE  PROTEINUR 100*  NITRITE NEGATIVE  LEUKOCYTESUR NEGATIVE      Imaging: US SCROTUM W/DOPPLER  Result Date: 06/29/2023 CLINICAL DATA:  Scrotal pain and swelling for 4 days. EXAM: SCROTAL ULTRASOUND DOPPLER ULTRASOUND OF THE TESTICLES TECHNIQUE: Complete ultrasound examination of the testicles, epididymis, and other scrotal structures was performed. Color and spectral Doppler ultrasound were also utilized to evaluate blood flow to the testicles. COMPARISON:  None Available. FINDINGS: Right testicle Measurements: 4.7 x 2.2 x 3.1 cm. No mass  or microlithiasis visualized. Left testicle Measurements: 4.6 x 2.4 x 2.8 cm. No mass or microlithiasis visualized. Right epididymis:  Normal in size and appearance. Left epididymis: Normal in size and vascularity. There is a 4 x 8 x 7 mm cyst in the left epididymis. Hydrocele:  Small bilateral hydroceles are present. Varicocele:  Left-sided varicocele is present. Pulsed Doppler interrogation of both testes demonstrates normal low resistance arterial and venous waveforms bilaterally. IMPRESSION: 1. No evidence of testicular torsion. 2. Small bilateral hydroceles. 3. Left-sided varicocele. 4. 8 mm left epididymal cyst. Electronically Signed   By: Darliss Cheney M.D.   On: 06/29/2023 16:36   US RENAL  Result Date: 06/28/2023 CLINICAL DATA:  Acute kidney injury EXAM: RENAL / URINARY TRACT ULTRASOUND COMPLETE COMPARISON:  None Available. FINDINGS: Right Kidney: Renal measurements: 11.4 x 5.0 x 5.6 cm = volume: 167.7 mL. Echogenicity within normal limits. No mass or hydronephrosis visualized. Left Kidney: Renal measurements: 12.2 x 6.9 x  6.5 cm = volume: 287.1 mL. Echogenicity within normal limits. No mass or hydronephrosis visualized. Bladder: Appears normal for degree of bladder distention. Other: None. IMPRESSION: No collecting system dilatation. Electronically Signed   By: Karen Kays M.D.   On: 06/28/2023 16:42   US ABDOMEN LIMITED RUQ (LIVER/GB)  Result Date: 06/28/2023 CLINICAL DATA:  Right upper quadrant pain for 2 days EXAM: ULTRASOUND ABDOMEN LIMITED RIGHT UPPER QUADRANT COMPARISON:  Ultrasound 02/21/2023 FINDINGS: Gallbladder: Mildly distended gallbladder. No shadowing stones. There is mild gallbladder wall thickening of 5 mm of uncertain etiology. There is also a hypoechoic area along the margin of the gallbladder measuring 19 x 7 x 5 mm. Underlying lesion is possible. No blood flow to this area on Doppler. Common bile duct: Diameter: 4 mm Liver: Diffusely echogenic hepatic parenchyma consistent with  fatty liver infiltration. Portal vein is patent on color Doppler imaging with normal direction of blood flow towards the liver. Other: None. IMPRESSION: No gallstones. However gallbladder wall has a diffuse thickening with a focal nodular area measuring up to 19 by 7 x 5 mm. Underlying lesion is possible. Please correlate with any prior or follow up MRI with and without contrast when appropriate to further delineate. No biliary ductal dilatation.  Fatty liver infiltration. Electronically Signed   By: Karen Kays M.D.   On: 06/28/2023 15:06     Medications:    sodium chloride Stopped (06/30/23 1158)    amLODipine  10 mg Oral QPM   aspirin EC  81 mg Oral Daily   carvedilol  25 mg Oral BID WC   clopidogrel  75 mg Oral Daily   doxycycline  100 mg Oral Q12H   folic acid  1 mg Oral Daily   heparin  5,000 Units Subcutaneous Q8H   levETIRAcetam  500 mg Oral BID   nystatin   Topical BID   pantoprazole  40 mg Oral Daily   acetaminophen **OR** acetaminophen, guaiFENesin-dextromethorphan, LORazepam, melatonin, nicotine, ondansetron **OR** ondansetron (ZOFRAN) IV, senna-docusate  Assessment/ Plan:  Mr. Toy Snelgrove is a 52 y.o.  male with medical problems of hypertension, alcohol abuse, hyperlipidemia, history of TIA, GERD, erectile dysfunction, tobacco abuse, history of seizures  was admitted on 06/28/2023 for Shortness of breath [R06.02] SOB (shortness of breath) [R06.02] AKI (acute kidney injury) (HCC) [N17.9] Anemia, unspecified type [D64.9]   Acute kidney injury on chronic kidney disease stage IIIa. Hematuria Proteinuria. Elevated liver enzymes Hypoalbuminemia Fatty liver Alcohol abuse Current smoker Scrotal Abscess  Lab Results  Component Value Date   CREATININE 8.06 (H) 06/30/2023   CREATININE 5.88 (H) 06/29/2023   CREATININE 4.18 (H) 06/28/2023    Intake/Output Summary (Last 24 hours) at 06/30/2023 1202 Last data filed at 06/30/2023 1100 Gross per 24 hour  Intake 2019.45 ml   Output --  Net 2019.45 ml   Baseline creatinine of 1.61, GFR 51 from 06/13/2023.  Presenting creatinine of 4.18, GFR 16.  In addition patient has elevated liver enzymes suggesting hepatitis possibly alcoholic hepatitis.  Albumin is low.  Diffuse echogenic liver consistent with fatty infiltration. Urinalysis with moderate blood, 0-5 RBCs, 0-5 WBCs, 100 mg of protein. Renal ultrasound is negative for obstruction, mass.   Differential diagnosis includes nonsteroidals induced AKI, FSGS, glomerulonephritis, infection related AKI from epididymitis.   Plan: Creatinine continues to worsen, 8.06 today.  No urine output recorded. Appears scrotal abscess with drainage noted overnight.  Would benefit from surgical consult to investigate further debridement along with culture of drainage. Urology consult pending. Receiving ceftriaxone and  doxycycline. Continue IV fluids for now. Protein creatinine ratio 2.23, CK 106, UDS negative Continue supportive care No acute indication for dialysis.  Did speak with patient about possibly requiring temporary dialysis if renal function continues to worsen.    LOS: 2 Gary [name] 9/22/202412:02 PM   Patient was seen and examined with Gary Beavers, NP.  I personally formulated plan of care for the problems addressed and discussed with NP.  I agree with the note as documented except as noted below. I take the responsibility for the inherent risk of patient management.

## 2023-06-30 NOTE — Progress Notes (Signed)
Patient oob to shower.  Noted scrotal abscess opened and large amounts of puss draining.  Provider made aware and applied dry dressing to area.  Will change as becomes saturated. Scrotum is swollen and tender.

## 2023-06-30 NOTE — Progress Notes (Addendum)
PROGRESS NOTE    Oluwatobi Kindschi   JOA:[id number] DOB: 13-Aug-1971  DOA: 06/28/2023 Date of Service: 06/30/23 PCP: Rolm Gala, NP     Brief Narrative / Hospital Course:  Mr. Larenzo Cittadino is a 52 year old male with history of alcohol abuse, hypertension, hyperlipidemia, history of TIA, GERD, erectile dysfunction, tobacco use, history of seizure, who presents to the emergency department for chief concerns of shortness of breath and dizziness. 09/20: in ED, VSS, mild hyponatremia sodium 132, significantly elevated creatinine 4.18 w/ GFR 16, elevated LFT  AST 98 and ALT 76, Bili 1.9, ALP 156, BNP WNL, Troponin and CK WNL, WBC elevated 17.5, Hgb 9.1. CXR nonacute. Renal US normal no dilatation, Abd [place] (+)fatty liver, GB w/ diffuse wall thickening and focal nodular area measuring up to 19 by 7 x 5 mm concern for underlying lesion and advise compare w/ MRI. Given Protonix 80 mg IV one-time dose, sodium chloride 1 L bolus. Admitted to hospitalist service.  09/21: SOB improved, renal function worse Cr to 5.88, consulted to nephrology. Pt c/o scrotal pain, [place] (+)small bl hydrocele L varicocele L epididymal cyst - started tx for epididymitis given leukocytosis  09/22: early AM, scrotum apparently opened to drain - pt describes clear thin liquid, bandage appears serous fluid, no purulent, no erythema to skin - monitor. Cr worse, up to 8, nephrology following, continuing IV fluids.   Consultants:  Nephrology   Procedures: none      ASSESSMENT & PLAN:   Shortness of breath without respiratory failure  Seems likely to be respiratory compensation for metabolic acidosis d/t renal failure  Differentials would include COPD history of tobacco use  CXR personally reviewed, no concerns  Neg troponin, ACS unlikely    AKI (acute kidney injury) on CKD3a - worsening --> acute renal faillure  suspect multifactorial including prerenal in setting of poor p.o./fluid intake, NSAID use,   glomerulonephritis, rhabdomyolysis, infection related AKI from epididymitis.  No concerns on renal US  UDS neg CK WNL Monitor BMP in am 09/21 --> worse --> start sodium bicarb infusion Strict I&O Need urine specimen, RN aware to get one today asap Nephrology consult  Pending ANA, ANCA, C3, C4, kappa/lambda, PTH, protein electrophoresis NS continuous at 125 mL/h   Gallbladder mass - nodular 19 mm x 7 mm x 5 mm on RUQ [place] Associated w/ elevation in Bili, AST, ALT, ALP Risk of gallbladder carcinoma given the size read  before surgical intervention is considered, need further imaging  MRCP with and without contrast has been deferred on admission due to acute kidney injury, but plan for this once pt can tolerate   Elevated liver enzymes Suspect secondary to gallbladder disease as above, complicated by alcohol abuse  Monitor CMP, LFT Hepatitis panel pending    History Seizures Calculated creatinine clearance is 13 mL/min on admission Patient on Keppra 1000 mg p.o. twice daily at home. Placed on Keppra reduced dosing to 500 mg p.o. twice daily in setting of acute kidney injury   Scrotal pain w/  Small bilateral hydrocele Left-sided varicocele 8 mm left epididymal cyst. Leukocytosis Given scrotal swelling concern for epididymitis  Ceftriaxone x1 + doxycycline 100 mg bid x10 days (FQ would cover enteric pathogens but pt not sexually active for some time, FQ renal dosing problematic, will give doxy) started 09/21 Check for GC/CT in urine --> neg  Frequent dressing changes Non-urgent urology consult, this does NOT appear c/w fournier's or other emergent illness   Hyponatremia - stable, mild Likely d/t renal  failure  Follow BMP   Metabolic acidosis Mild, secondary to acute kidney injury Sodium bicarbonate 325 mg p.o. twice daily, 2 doses ordered   History CVA Statin held for now given elevation LFT  Plavix   HLD  04/25/2023, cardiologist discontinued Pravachol and started patient  on Crestor 40 mg w/ improvement in cholesterol and AST/ALT.  Hold statin for now w/ elevated LFT   Essential HTN On home amlodipine, carvedilol   GERD  PPI  Insomnia Melatonin 5 mg nightly as needed for sleep   Tobacco abuse As needed nicotine patch ordered      Obesity based on BMI: Body mass index is 30.37 kg/m.   DVT prophylaxis: heparin  IV fluids: NS continuous IV fluids  Nutrition: renal/carb Central lines / invasive devices: none  Code Status: FULL CODE ACP documentation reviewed: 06/29/23 and none on file in [facility]  TOC needs: none at this time Barriers to dispo / significant pending items: renal function improvement               Subjective / Brief ROS:  Patient reports scrotal pain better today, drained some in the shower thos morning, clear runny fluid from R Denies CP/SOB, breathing today feels better Pain controlled.  Denies new weakness.  Tolerating diet.  UOP about the same    Family Communication: none at this time     Objective Findings:  Vitals:   06/29/23 2334 06/30/23 0741 06/30/23 1114 06/30/23 1151  BP: 114/62 119/68 118/71 106/68  Pulse: 81 74 69 67  Resp: 18 17 16 18   Temp: 99.2 F (37.3 C) 98.3 F (36.8 C) 98.1 F (36.7 C) 99.1 F (37.3 C)  TempSrc:  Oral Oral Oral  SpO2: 97% 96% 94% 98%  Weight:      Height:        Intake/Output Summary (Last 24 hours) at 06/30/2023 1445 Last data filed at 06/30/2023 1100 Gross per 24 hour  Intake 1960.82 ml  Output --  Net 1960.82 ml   Filed Weights   06/28/23 1121  Weight: 96 kg    Examination:  Physical Exam Exam conducted with a chaperone present.  Constitutional:      General: He is not in acute distress. Cardiovascular:     Rate and Rhythm: Normal rate and regular rhythm.  Pulmonary:     Effort: Pulmonary effort is normal.     Breath sounds: Normal breath sounds.  Genitourinary:    Pubic Area: No rash.      Testes:        Right: Swelling and testicular  hydrocele present. Tenderness not present.        Left: Swelling and testicular hydrocele present. Tenderness not present.     Epididymis:     Right: Inflamed.     Left: Inflamed.  Musculoskeletal:     Right lower leg: No edema.     Left lower leg: No edema.  Neurological:     General: No focal deficit present.     Mental Status: He is alert and oriented to person, place, and time.  Psychiatric:        Mood and Affect: Mood normal.        Behavior: Behavior normal.          Scheduled Medications:   amLODipine  10 mg Oral QPM   aspirin EC  81 mg Oral Daily   carvedilol  25 mg Oral BID WC   clopidogrel  75 mg Oral Daily   doxycycline  100 mg Oral Q12H   folic acid  1 mg Oral Daily   heparin  5,000 Units Subcutaneous Q8H   levETIRAcetam  500 mg Oral BID   nystatin   Topical BID   pantoprazole  40 mg Oral Daily    Continuous Infusions:    PRN Medications:  acetaminophen **OR** acetaminophen, guaiFENesin-dextromethorphan, LORazepam, melatonin, nicotine, ondansetron **OR** ondansetron (ZOFRAN) IV, senna-docusate  Antimicrobials from admission:  Anti-infectives (From admission, onward)    Start     Dose/Rate Route Frequency Ordered Stop   06/29/23 1500  cefTRIAXone (ROCEPHIN) 0.5 g in dextrose 5 % 50 mL IVPB        0.5 g 100 mL/hr over 30 Minutes Intravenous  Once 06/29/23 1402 06/29/23 1617   06/29/23 1500  doxycycline (VIBRA-TABS) tablet 100 mg        100 mg Oral Every 12 hours 06/29/23 1402 07/09/23 0959           Data Reviewed:  I have personally reviewed the following...  CBC: Recent Labs  Lab 06/28/23 1123 06/29/23 0613  WBC 17.5* 16.6*  HGB 9.1* 8.1*  HCT 26.6* 23.5*  MCV 94.3 93.6  PLT 166 144*   Basic Metabolic Panel: Recent Labs  Lab 06/28/23 1123 06/28/23 1232 06/29/23 0613 06/30/23 0626  NA 132*  --  132* 133*  K 4.0  --  4.1 4.0  CL 98  --  104 107  CO2 20*  --  16* 15*  GLUCOSE 140*  --  101* 116*  BUN 34*  --  46* 60*   CREATININE 4.18*  --  5.88* 8.06*  CALCIUM 9.1  --  7.8* 7.7*  MG  --  2.3  --   --    GFR: Estimated Creatinine Clearance: 12.6 mL/min (A) (by C-G formula based on SCr of 8.06 mg/dL (H)). Liver Function Tests: Recent Labs  Lab 06/28/23 1123 06/29/23 0613 06/30/23 0626  AST 98* 70* 77*  ALT 76* 58* 57*  ALKPHOS 156* 139* 157*  BILITOT 1.9* 1.7* 1.3*  PROT 7.9 6.8 7.0  ALBUMIN 3.3* 2.7* 2.6*   No results for input(s): "LIPASE", "AMYLASE" in the last 168 hours. No results for input(s): "AMMONIA" in the last 168 hours. Coagulation Profile: Recent Labs  Lab 06/28/23 1232  INR 1.2   Cardiac Enzymes: Recent Labs  Lab 06/28/23 1232 06/29/23 0613  CKTOTAL 109 106   BNP (last 3 results) No results for input(s): "PROBNP" in the last 8760 hours. HbA1C: No results for input(s): "HGBA1C" in the last 72 hours. CBG: No results for input(s): "GLUCAP" in the last 168 hours. Lipid Profile: No results for input(s): "CHOL", "HDL", "LDLCALC", "TRIG", "CHOLHDL", "LDLDIRECT" in the last 72 hours. Thyroid Function Tests: No results for input(s): "TSH", "T4TOTAL", "FREET4", "T3FREE", "THYROIDAB" in the last 72 hours. Anemia Panel: No results for input(s): "VITAMINB12", "FOLATE", "FERRITIN", "TIBC", "IRON", "RETICCTPCT" in the last 72 hours. Most Recent Urinalysis On File:     Component Value Date/Time   COLORURINE YELLOW (A) 06/29/2023 1042   APPEARANCEUR CLOUDY (A) 06/29/2023 1042   APPEARANCEUR Turbid (A) 11/05/2018 0000   LABSPEC 1.010 06/29/2023 1042   PHURINE 5.0 06/29/2023 1042   GLUCOSEU NEGATIVE 06/29/2023 1042   HGBUR MODERATE (A) 06/29/2023 1042   BILIRUBINUR NEGATIVE 06/29/2023 1042   BILIRUBINUR Negative 11/05/2018 0000   KETONESUR NEGATIVE 06/29/2023 1042   PROTEINUR 100 (A) 06/29/2023 1042   NITRITE NEGATIVE 06/29/2023 1042   LEUKOCYTESUR NEGATIVE 06/29/2023 1042   Sepsis Labs: @LABRCNTIP (procalcitonin:4,lacticidven:4) Microbiology: Recent  Results (from the  past 240 hour(s))  Blood culture (single)     Status: None (Preliminary result)   Collection Time: 06/28/23 12:22 PM   Specimen: BLOOD  Result Value Ref Range Status   Specimen Description BLOOD LEFT ANTECUBITAL  Final   Special Requests   Final    BOTTLES DRAWN AEROBIC AND ANAEROBIC Blood Culture adequate volume   Culture   Final    NO GROWTH 2 DAYS Performed at [facility], [address] [place], [place] [address]    Report Status PENDING  Incomplete  SARS Coronavirus 2 by RT PCR (hospital order, performed in [facility] hospital lab) *cepheid single result test* Anterior Nasal Swab     Status: None   Collection Time: 06/29/23 12:15 AM   Specimen: Anterior Nasal Swab  Result Value Ref Range Status   SARS Coronavirus 2 by RT PCR NEGATIVE NEGATIVE Final    Comment: (NOTE) SARS-CoV-2 target nucleic acids are NOT DETECTED.  The SARS-CoV-2 RNA is generally detectable in upper and lower respiratory specimens during the acute phase of infection. The lowest concentration of SARS-CoV-2 viral copies this assay can detect is 250 copies / mL. A negative result does not preclude SARS-CoV-2 infection and should not be used as the sole basis for treatment or other patient management decisions.  A negative result may occur with improper specimen collection / handling, submission of specimen other than nasopharyngeal swab, presence of viral mutation(s) within the areas targeted by this assay, and inadequate number of viral copies (<250 copies / mL). A negative result must be combined with clinical observations, patient history, and epidemiological information.  Fact Sheet for Patients:   RoadLapTop.co.za  Fact Sheet for Healthcare Providers: http://kim-miller.com/  This test is not yet approved or  cleared by the [place] FDA and has been authorized for detection and/or diagnosis of SARS-CoV-2 by FDA under an Emergency Use  Authorization (EUA).  This EUA will remain in effect (meaning this test can be used) for the duration of the COVID-19 declaration under Section 564(b)(1) of the Act, 21 U.S.C. section 360bbb-3(b)(1), unless the authorization is terminated or revoked sooner.  Performed at [facility], [address] [place], [place] [address]   Chlamydia/NGC rt PCR [facility] only)     Status: None   Collection Time: 06/29/23 10:42 AM   Specimen: Urine; GU  Result Value Ref Range Status   Specimen source GC/Chlam URINE, RANDOM  Final   Chlamydia Tr NOT DETECTED NOT DETECTED Final   N gonorrhoeae NOT DETECTED NOT DETECTED Final    Comment: (NOTE) This CT/NG assay has not been evaluated in patients with a history of  hysterectomy. Performed at [facility], [address], [place], [place] [address]       Radiology Studies last 3 days: US SCROTUM W/DOPPLER  Result Date: 06/29/2023 CLINICAL DATA:  Scrotal pain and swelling for 4 days. EXAM: SCROTAL ULTRASOUND DOPPLER ULTRASOUND OF THE TESTICLES TECHNIQUE: Complete ultrasound examination of the testicles, epididymis, and other scrotal structures was performed. Color and spectral Doppler ultrasound were also utilized to evaluate blood flow to the testicles. COMPARISON:  None Available. FINDINGS: Right testicle Measurements: 4.7 x 2.2 x 3.1 cm. No mass or microlithiasis visualized. Left testicle Measurements: 4.6 x 2.4 x 2.8 cm. No mass or microlithiasis visualized. Right epididymis:  Normal in size and appearance. Left epididymis: Normal in size and vascularity. There is a 4 x 8 x 7 mm cyst in the left epididymis. Hydrocele:  Small  bilateral hydroceles are present. Varicocele:  Left-sided varicocele is present. Pulsed Doppler interrogation of both testes demonstrates normal low resistance arterial and venous waveforms bilaterally. IMPRESSION: 1. No evidence of testicular torsion. 2. Small bilateral hydroceles. 3. Left-sided varicocele. 4. 8 mm  left epididymal cyst. Electronically Signed   By: Darliss Cheney M.D.   On: 06/29/2023 16:36   US RENAL  Result Date: 06/28/2023 CLINICAL DATA:  Acute kidney injury EXAM: RENAL / URINARY TRACT ULTRASOUND COMPLETE COMPARISON:  None Available. FINDINGS: Right Kidney: Renal measurements: 11.4 x 5.0 x 5.6 cm = volume: 167.7 mL. Echogenicity within normal limits. No mass or hydronephrosis visualized. Left Kidney: Renal measurements: 12.2 x 6.9 x 6.5 cm = volume: 287.1 mL. Echogenicity within normal limits. No mass or hydronephrosis visualized. Bladder: Appears normal for degree of bladder distention. Other: None. IMPRESSION: No collecting system dilatation. Electronically Signed   By: Karen Kays M.D.   On: 06/28/2023 16:42   US ABDOMEN LIMITED RUQ (LIVER/GB)  Result Date: 06/28/2023 CLINICAL DATA:  Right upper quadrant pain for 2 days EXAM: ULTRASOUND ABDOMEN LIMITED RIGHT UPPER QUADRANT COMPARISON:  Ultrasound 02/21/2023 FINDINGS: Gallbladder: Mildly distended gallbladder. No shadowing stones. There is mild gallbladder wall thickening of 5 mm of uncertain etiology. There is also a hypoechoic area along the margin of the gallbladder measuring 19 x 7 x 5 mm. Underlying lesion is possible. No blood flow to this area on Doppler. Common bile duct: Diameter: 4 mm Liver: Diffusely echogenic hepatic parenchyma consistent with fatty liver infiltration. Portal vein is patent on color Doppler imaging with normal direction of blood flow towards the liver. Other: None. IMPRESSION: No gallstones. However gallbladder wall has a diffuse thickening with a focal nodular area measuring up to 19 by 7 x 5 mm. Underlying lesion is possible. Please correlate with any prior or follow up MRI with and without contrast when appropriate to further delineate. No biliary ductal dilatation.  Fatty liver infiltration. Electronically Signed   By: Karen Kays M.D.   On: 06/28/2023 15:06   DG Chest 2 View  Result Date: 06/28/2023 CLINICAL  DATA:  Shortness of breath EXAM: CHEST - 2 VIEW COMPARISON:  None Available. FINDINGS: The heart size and mediastinal contours are within normal limits. No consolidation, pneumothorax or effusion. No edema. The visualized skeletal structures are unremarkable. IMPRESSION: No acute cardiopulmonary disease. Electronically Signed   By: Karen Kays M.D.   On: 06/28/2023 12:49             LOS: 2 days    Time spent: 50 min    Sunnie Nielsen, DO [facility] 06/30/2023, 2:45 PM    Dictation software may have been used to generate the above note. Typos may occur and escape review in typed/dictated notes. Please contact Dr Lyn Hollingshead directly for clarity if needed.  Staff may message me via secure chat in Epic  but this may not receive an immediate response,  please page me for urgent matters!  If 7PM-7AM, please contact night coverage [email or web address]

## 2023-07-01 ENCOUNTER — Inpatient Hospital Stay: Payer: Medicaid Other

## 2023-07-01 DIAGNOSIS — N5089 Other specified disorders of the male genital organs: Secondary | ICD-10-CM

## 2023-07-01 DIAGNOSIS — N179 Acute kidney failure, unspecified: Secondary | ICD-10-CM

## 2023-07-01 LAB — CBC
HCT: 23.8 % — ABNORMAL LOW (ref 39.0–52.0)
Hemoglobin: 8.2 g/dL — ABNORMAL LOW (ref 13.0–17.0)
MCH: 31.9 pg (ref 26.0–34.0)
MCHC: 34.5 g/dL (ref 30.0–36.0)
MCV: 92.6 fL (ref 80.0–100.0)
Platelets: 179 10*3/uL (ref 150–400)
RBC: 2.57 MIL/uL — ABNORMAL LOW (ref 4.22–5.81)
RDW: 12.6 % (ref 11.5–15.5)
WBC: 12.1 10*3/uL — ABNORMAL HIGH (ref 4.0–10.5)
nRBC: 0 % (ref 0.0–0.2)

## 2023-07-01 LAB — BASIC METABOLIC PANEL
Anion gap: 9 (ref 5–15)
BUN: 72 mg/dL — ABNORMAL HIGH (ref 6–20)
CO2: 16 mmol/L — ABNORMAL LOW (ref 22–32)
Calcium: 8.1 mg/dL — ABNORMAL LOW (ref 8.9–10.3)
Chloride: 111 mmol/L (ref 98–111)
Creatinine, Ser: 9.46 mg/dL — ABNORMAL HIGH (ref 0.61–1.24)
GFR, Estimated: 6 mL/min — ABNORMAL LOW (ref >60)
Glucose, Bld: 100 mg/dL — ABNORMAL HIGH (ref 70–99)
Potassium: 4.3 mmol/L (ref 3.5–5.1)
Sodium: 136 mmol/L (ref 135–145)

## 2023-07-01 LAB — PARATHYROID HORMONE, INTACT (NO CA): PTH: 110 pg/mL — ABNORMAL HIGH (ref 15–65)

## 2023-07-01 LAB — ANA COMPREHENSIVE PANEL
Anti JO-1: <0.2 AI (ref 0.0–0.9)
Centromere Ab Screen: <0.2 AI (ref 0.0–0.9)
Chromatin Ab SerPl-aCnc: <0.2 AI (ref 0.0–0.9)
ENA SM Ab Ser-aCnc: <0.2 AI (ref 0.0–0.9)
Ribonucleic Protein: <0.2 AI (ref 0.0–0.9)
SSA (Ro) (ENA) Antibody, IgG: 0.2 AI (ref 0.0–0.9)
SSB (La) (ENA) Antibody, IgG: <0.2 AI (ref 0.0–0.9)
Scleroderma (Scl-70) (ENA) Antibody, IgG: <0.2 AI (ref 0.0–0.9)
ds DNA Ab: <1 IU/mL (ref 0–9)

## 2023-07-01 LAB — HEPATIC FUNCTION PANEL
ALT: 49 U/L — ABNORMAL HIGH (ref 0–44)
AST: 51 U/L — ABNORMAL HIGH (ref 15–41)
Albumin: 2.5 g/dL — ABNORMAL LOW (ref 3.5–5.0)
Alkaline Phosphatase: 169 U/L — ABNORMAL HIGH (ref 38–126)
Bilirubin, Direct: 0.4 mg/dL — ABNORMAL HIGH (ref 0.0–0.2)
Indirect Bilirubin: 0.8 mg/dL (ref 0.3–0.9)
Total Bilirubin: 1.2 mg/dL (ref 0.3–1.2)
Total Protein: 6.5 g/dL (ref 6.5–8.1)

## 2023-07-01 LAB — HEPATITIS B CORE ANTIBODY, TOTAL: Hep B Core Total Ab: NONREACTIVE

## 2023-07-01 LAB — HEPATITIS B SURFACE ANTIGEN: Hepatitis B Surface Ag: NONREACTIVE

## 2023-07-01 MED ORDER — THIAMINE MONONITRATE 100 MG PO TABS
100.0000 mg | ORAL_TABLET | Freq: Every day | ORAL | Status: DC
  Administered 2023-07-01 – 2023-07-10 (×9): 100 mg via ORAL
  Filled 2023-07-01 (×10): qty 1

## 2023-07-01 MED ORDER — SODIUM CHLORIDE 0.9 % IV SOLN: INTRAVENOUS | Status: DC

## 2023-07-01 MED ORDER — LACTULOSE 10 GM/15ML PO SOLN
20.0000 g | Freq: Once | ORAL | Status: DC
  Filled 2023-07-01: qty 30

## 2023-07-01 NOTE — Consult Note (Signed)
Urology Consult  I have been asked to see the patient by Dr. Lyn Hollingshead, for evaluation and management of hydrocele, varicocele, epididymal mass, possible abscess.  Chief Complaint: Scrotal pain  History of Present Illness: Gary [name] is a 52 y.o. year old comorbid male admitted on 06/28/2023 with shortness of breath and AKI progressing to acute renal failure for whom nephrology has been consulted who subsequently reported scrotal pain.  Scrotal ultrasound 2 days ago revealed small bilateral hydroceles, left varicocele, and an 8 mm left epididymal cyst; he has been started on antibiotics for possible epididymitis.  UA from earlier this admission was notable for no nitrites, no hematuria, no pyuria, and rare bacteria.  Gonorrhea and Chlamydia testing were negative.   Today he reports sudden onset right inguinal/scrotal pain around the time of admission. An area in his right inguinal crease started spontaneously draining 2 days ago. His pain has improved since. No prior history of scrotal/inguinal abscess.  Anti-infectives (From admission, onward)    Start     Dose/Rate Route Frequency Ordered Stop   06/29/23 1500  cefTRIAXone (ROCEPHIN) 0.5 g in dextrose 5 % 50 mL IVPB        0.5 g 100 mL/hr over 30 Minutes Intravenous  Once 06/29/23 1402 06/29/23 1617   06/29/23 1500  doxycycline (VIBRA-TABS) tablet 100 mg        100 mg Oral Every 12 hours 06/29/23 1402 07/09/23 0959        Past Medical History:  Diagnosis Date   Alcohol use    Carotid artery occlusion    GERD (gastroesophageal reflux disease)    Hypercholesteremia    Hypertension    Seizures (HCC)    Stroke (HCC)    TIA 12/29/20, re-admitted 01/14/21 for sequelae   Tobacco abuse     Past Surgical History:  Procedure Laterality Date   NO PAST SURGERIES      Home Medications:  Current Meds  Medication Sig   amLODipine (NORVASC) 10 MG tablet Take 1 tablet (10 mg total) by mouth every evening.   aspirin 81 MG tablet  Take 1 tablet (81 mg total) by mouth daily.   carvedilol (COREG) 25 MG tablet Take 1 tablet (25 mg total) by mouth 2 (two) times daily with a meal.   clopidogrel (PLAVIX) 75 MG tablet Take 1 tablet (75 mg total) by mouth daily.   folic acid (FOLVITE) 1 MG tablet Take 1 tablet (1 mg total) by mouth daily.   ibuprofen (ADVIL) 200 MG tablet Take 800 mg by mouth daily as needed for headache or moderate pain (usually takes daily for headaches, leg pain in am).   levETIRAcetam (KEPPRA) 1000 MG tablet Take 1 tablet (1,000 mg total) by mouth 2 (two) times daily.   losartan (COZAAR) 100 MG tablet Take 1 tablet (100 mg total) by mouth in the morning.   magnesium oxide (MAG-OX) 400 MG tablet Take 1 tablet (400 mg total) by mouth daily.   pantoprazole (PROTONIX) 40 MG tablet Take 1 tablet (40 mg total) by mouth daily.   rosuvastatin (CRESTOR) 40 MG tablet Take 1 tablet (40 mg total) by mouth daily.   sildenafil (VIAGRA) 100 MG tablet Take 1 tablet 1 hour prior to intercourse.   thiamine (VITAMIN B-1) 100 MG tablet Take 1 tablet (100 mg total) by mouth daily.    Allergies: No Known Allergies  Family History  Problem Relation Age of Onset   Heart attack Mother    Hypertension Mother  pt reports mother has stent   Other Father        unknown medical history   Healthy Sister    Other Maternal Grandmother        unknown medical history   Other Maternal Grandfather 44       choked on a watermelon seed   Other Paternal Grandmother        unknown medical history   Other Paternal Grandfather        unknown medical history    Social History:  reports that he has been smoking cigarettes. He has a 8.5 pack-year smoking history. He has never used smokeless tobacco. He reports current alcohol use of about 21.0 standard drinks of alcohol per week. He reports that he does not use drugs.  ROS: A complete review of systems was performed.  All systems are negative except for pertinent findings as  noted.  Physical Exam:  Vital signs in last 24 hours: Temp:  [97.6 F (36.4 C)-99.1 F (37.3 C)] 97.6 F (36.4 C) (09/23 0914) Pulse Rate:  [65-75] 65 (09/23 0914) Resp:  [16-18] 18 (09/23 0914) BP: (106-126)/(60-76) 126/66 (09/23 0914) SpO2:  [94 %-99 %] 99 % (09/23 0914) Constitutional:  Alert and oriented, no acute distress HEENT: [place] AT, moist mucus membranes Cardiovascular: No clubbing, cyanosis, or edema Respiratory: Normal respiratory effort GU: Bilateral descended testicles. Induration without fluctuance of the right lateral scrotum/inguinal crease extending to the perineum with 2 open but not actively draining sinuses. No crepitus or malodor. Skin: No rashes, bruises or suspicious lesions Neurologic: Grossly intact, no focal deficits, moving all 4 extremities Psychiatric: Normal mood and affect  Laboratory Data:  Recent Labs    06/28/23 1123 06/29/23 0613 07/01/23 0634  WBC 17.5* 16.6* 12.1*  HGB 9.1* 8.1* 8.2*  HCT 26.6* 23.5* 23.8*   Recent Labs    06/29/23 0613 06/30/23 0626 07/01/23 0634  NA 132* 133* 136  K 4.1 4.0 4.3  CL 104 107 111  CO2 16* 15* 16*  GLUCOSE 101* 116* 100*  BUN 46* 60* 72*  CREATININE 5.88* 8.06* 9.46*  CALCIUM 7.8* 7.7* 8.1*   Recent Labs    06/28/23 1232  INR 1.2   Urinalysis    Component Value Date/Time   COLORURINE YELLOW (A) 06/29/2023 1042   APPEARANCEUR CLOUDY (A) 06/29/2023 1042   APPEARANCEUR Turbid (A) 11/05/2018 0000   LABSPEC 1.010 06/29/2023 1042   PHURINE 5.0 06/29/2023 1042   GLUCOSEU NEGATIVE 06/29/2023 1042   HGBUR MODERATE (A) 06/29/2023 1042   BILIRUBINUR NEGATIVE 06/29/2023 1042   BILIRUBINUR Negative 11/05/2018 0000   KETONESUR NEGATIVE 06/29/2023 1042   PROTEINUR 100 (A) 06/29/2023 1042   NITRITE NEGATIVE 06/29/2023 1042   LEUKOCYTESUR NEGATIVE 06/29/2023 1042   Results for orders placed or performed during the hospital encounter of 06/28/23  Blood culture (single)     Status: None (Preliminary  result)   Collection Time: 06/28/23 12:22 PM   Specimen: BLOOD  Result Value Ref Range Status   Specimen Description BLOOD LEFT ANTECUBITAL  Final   Special Requests   Final    BOTTLES DRAWN AEROBIC AND ANAEROBIC Blood Culture adequate volume   Culture   Final    NO GROWTH 3 DAYS Performed at [facility], [address] [place], [place] [address]    Report Status PENDING  Incomplete  SARS Coronavirus 2 by RT PCR (hospital order, performed in [facility] hospital lab) *cepheid single result test* Anterior Nasal Swab  Status: None   Collection Time: 06/29/23 12:15 AM   Specimen: Anterior Nasal Swab  Result Value Ref Range Status   SARS Coronavirus 2 by RT PCR NEGATIVE NEGATIVE Final    Comment: (NOTE) SARS-CoV-2 target nucleic acids are NOT DETECTED.  The SARS-CoV-2 RNA is generally detectable in upper and lower respiratory specimens during the acute phase of infection. The lowest concentration of SARS-CoV-2 viral copies this assay can detect is 250 copies / mL. A negative result does not preclude SARS-CoV-2 infection and should not be used as the sole basis for treatment or other patient management decisions.  A negative result may occur with improper specimen collection / handling, submission of specimen other than nasopharyngeal swab, presence of viral mutation(s) within the areas targeted by this assay, and inadequate number of viral copies (<250 copies / mL). A negative result must be combined with clinical observations, patient history, and epidemiological information.  Fact Sheet for Patients:   RoadLapTop.co.za  Fact Sheet for Healthcare Providers: http://kim-miller.com/  This test is not yet approved or  cleared by the [place] FDA and has been authorized for detection and/or diagnosis of SARS-CoV-2 by FDA under an Emergency Use Authorization (EUA).  This EUA will remain in effect (meaning this test can be  used) for the duration of the COVID-19 declaration under Section 564(b)(1) of the Act, 21 U.S.C. section 360bbb-3(b)(1), unless the authorization is terminated or revoked sooner.  Performed at [facility], [address] [place], [place] [address]   Chlamydia/NGC rt PCR [facility] only)     Status: None   Collection Time: 06/29/23 10:42 AM   Specimen: Urine; GU  Result Value Ref Range Status   Specimen source GC/Chlam URINE, RANDOM  Final   Chlamydia Tr NOT DETECTED NOT DETECTED Final   N gonorrhoeae NOT DETECTED NOT DETECTED Final    Comment: (NOTE) This CT/NG assay has not been evaluated in patients with a history of  hysterectomy. Performed at [facility], [address] [place], [place] [address]     Radiologic Imaging: US SCROTUM W/DOPPLER  Result Date: 06/29/2023 CLINICAL DATA:  Scrotal pain and swelling for 4 days. EXAM: SCROTAL ULTRASOUND DOPPLER ULTRASOUND OF THE TESTICLES TECHNIQUE: Complete ultrasound examination of the testicles, epididymis, and other scrotal structures was performed. Color and spectral Doppler ultrasound were also utilized to evaluate blood flow to the testicles. COMPARISON:  None Available. FINDINGS: Right testicle Measurements: 4.7 x 2.2 x 3.1 cm. No mass or microlithiasis visualized. Left testicle Measurements: 4.6 x 2.4 x 2.8 cm. No mass or microlithiasis visualized. Right epididymis:  Normal in size and appearance. Left epididymis: Normal in size and vascularity. There is a 4 x 8 x 7 mm cyst in the left epididymis. Hydrocele:  Small bilateral hydroceles are present. Varicocele:  Left-sided varicocele is present. Pulsed Doppler interrogation of both testes demonstrates normal low resistance arterial and venous waveforms bilaterally. IMPRESSION: 1. No evidence of testicular torsion. 2. Small bilateral hydroceles. 3. Left-sided varicocele. 4. 8 mm left epididymal cyst. Electronically Signed   By: Darliss Cheney M.D.   On: 06/29/2023 16:36     Assessment & Plan:  52 year old male admitted with dyspnea and AKI progressing to acute kidney failure who subsequently developed a right lateral scrotal/inguinal abscess which is spontaneously draining.  Agree with doxycycline to cover MRSA.  Low suspicion for epididymitis.  It does not look like the abscess was imaged on prior scrotal ultrasound.  I am going to order another ultrasound today to make  sure there is no residual fluid collection that would warrant I&D, though I did not appreciate any significant fluctuance on physical exam today.  Thank you for involving me in this patient's care, I will continue to follow along.  Carman Ching, PA-C 07/01/2023 11:05 AM

## 2023-07-01 NOTE — Progress Notes (Signed)
Open wound on right Scrotal Abscess.site with purulent drainage. Cleansed site with saline, pat dry with gauze, applied nystatin powder to moisture fold area on groin. Covered site with gauzed and foam dressing. MD made aware.

## 2023-07-01 NOTE — Progress Notes (Signed)
PROGRESS NOTE    Gary [name]   WJX:[id number] DOB: 07-17-1971  DOA: 06/28/2023 Date of Service: 07/01/23 PCP: Rolm Gala, NP     Brief Narrative / Hospital Course:  Gary [name] is a 52 year old male with history of alcohol abuse, hypertension, hyperlipidemia, history of TIA, GERD, erectile dysfunction, tobacco use, history of seizure, who presents to the emergency department for chief concerns of shortness of breath and dizziness. 09/20: in ED, VSS, mild hyponatremia sodium 132, significantly elevated creatinine 4.18 w/ GFR 16, elevated LFT  AST 98 and ALT 76, Bili 1.9, ALP 156, BNP WNL, Troponin and CK WNL, WBC elevated 17.5, Hgb 9.1. CXR nonacute. Renal US normal no dilatation, Abd [place] (+)fatty liver, GB w/ diffuse wall thickening and focal nodular area measuring up to 19 by 7 x 5 mm concern for underlying lesion and advise compare w/ MRI. Given Protonix 80 mg IV one-time dose, sodium chloride 1 L bolus. Admitted to hospitalist service.  09/21: SOB improved, renal function worse Cr to 5.88, consulted to nephrology. Pt c/o scrotal pain, [place] (+)small bl hydrocele L varicocele L epididymal cyst - started tx for epididymitis given leukocytosis  09/22: early AM, scrotum apparently opened to drain - pt describes clear thin liquid, bandage appears serous fluid, no purulent, no erythema to skin - monitor. Cr worse, up to 8, nephrology following, continuing IV fluids.  09/23: Cr still worsening and may need to consider dialysis tomorrow, urology consult repeating US  Consultants:  Nephrology   Procedures: none      ASSESSMENT & PLAN:   Shortness of breath without respiratory failure  Seems likely to be respiratory compensation for metabolic acidosis d/t renal failure  Differentials would include COPD history of tobacco use  CXR personally reviewed, no concerns  Neg troponin, ACS unlikely    AKI (acute kidney injury) on CKD3a - worsening --> acute renal faillure   suspect multifactorial including prerenal in setting of poor p.o./fluid intake, NSAID use,  glomerulonephritis, rhabdomyolysis, infection related AKI from epididymitis.  No concerns on renal US  UDS neg CK WNL Monitor BMP  Strict I&O Nephrology following   Pending ANA, ANCA, C3, C4, kappa/lambda, PTH, protein electrophoresis NS continuous at 125 mL/h  May need HD tomorrow if still worsening   Gallbladder mass - nodular 19 mm x 7 mm x 5 mm on RUQ [place] Associated w/ elevation in Bili, AST, ALT, ALP Risk of gallbladder carcinoma given the size read  before surgical intervention is considered, need further imaging  MRCP with and without contrast has been deferred on admission due to acute kidney injury, but plan for this once pt can tolerate   Elevated liver enzymes Suspect secondary to gallbladder disease as above, complicated by alcohol abuse  Monitor CMP, LFT Hepatitis panel pending    History Seizures Calculated creatinine clearance is 13 mL/min on admission Patient on Keppra 1000 mg p.o. twice daily at home. Placed on Keppra reduced dosing to 500 mg p.o. twice daily in setting of acute kidney injury   Scrotal pain w/  Small bilateral hydrocele Left-sided varicocele 8 mm left epididymal cyst. Leukocytosis Ceftriaxone x1 + doxycycline 100 mg bid x10 days (FQ would cover enteric pathogens but pt not sexually active for some time, FQ renal dosing problematic, will give doxy) started 09/21 Check for GC/CT in urine --> neg  Frequent dressing changes Non-urgent urology consult, this does NOT appear c/w fournier's or other emergent illness   Hyponatremia - stable, mild Likely d/t renal failure  Follow BMP   Metabolic acidosis Mild, secondary to acute kidney injury Sodium bicarbonate 325 mg p.o. twice daily, 2 doses ordered   History CVA Statin held for now given elevation LFT  Plavix   HLD  04/25/2023, cardiologist discontinued Pravachol and started patient on Crestor 40 mg  w/ improvement in cholesterol and AST/ALT.  Hold statin for now w/ elevated LFT   Essential HTN On home amlodipine, carvedilol   GERD  PPI  Insomnia Melatonin 5 mg nightly as needed for sleep   Tobacco abuse As needed nicotine patch ordered      Obesity based on BMI: Body mass index is 30.37 kg/m.   DVT prophylaxis: heparin  IV fluids: NS continuous IV fluids  Nutrition: renal/carb Central lines / invasive devices: none  Code Status: FULL CODE ACP documentation reviewed: 06/29/23 and none on file in [facility]  TOC needs: none at this time Barriers to dispo / significant pending items: renal function improvement               Subjective / Brief ROS:  Denies CP/SOB, breathing today feels ok Pain controlled.  Denies new weakness.  Tolerating diet.  UOP about the same    Family Communication: none at this time     Objective Findings:  Vitals:   06/30/23 1151 06/30/23 1523 07/01/23 0040 07/01/23 0914  BP: 106/68 114/76 108/60 126/66  Pulse: 67 75 67 65  Resp: 18 18 18 18   Temp: 99.1 F (37.3 C) 98 F (36.7 C) 98 F (36.7 C) 97.6 F (36.4 C)  TempSrc: Oral   Oral  SpO2: 98% 98% 97% 99%  Weight:      Height:        Intake/Output Summary (Last 24 hours) at 07/01/2023 1458 Last data filed at 07/01/2023 1115 Gross per 24 hour  Intake 871.84 ml  Output 800 ml  Net 71.84 ml   Filed Weights   06/28/23 1121  Weight: 96 kg    Examination:  Physical Exam Exam conducted with a chaperone present.  Constitutional:      General: He is not in acute distress. Cardiovascular:     Rate and Rhythm: Normal rate and regular rhythm.  Pulmonary:     Effort: Pulmonary effort is normal.     Breath sounds: Normal breath sounds.  Genitourinary:    Pubic Area: No rash.      Testes:        Right: Swelling and testicular hydrocele present. Tenderness not present.        Left: Swelling and testicular hydrocele present. Tenderness not present.     Epididymis:      Right: Inflamed.     Left: Inflamed.  Musculoskeletal:     Right lower leg: No edema.     Left lower leg: No edema.  Neurological:     General: No focal deficit present.     Mental Status: He is alert and oriented to person, place, and time.  Psychiatric:        Mood and Affect: Mood normal.        Behavior: Behavior normal.          Scheduled Medications:   amLODipine  10 mg Oral QPM   aspirin EC  81 mg Oral Daily   carvedilol  25 mg Oral BID WC   clopidogrel  75 mg Oral Daily   doxycycline  100 mg Oral Q12H   folic acid  1 mg Oral Daily   heparin  5,000 Units  Subcutaneous Q8H   lactulose  20 g Oral Once   levETIRAcetam  500 mg Oral BID   nystatin   Topical BID   pantoprazole  40 mg Oral Daily   thiamine  100 mg Oral Daily    Continuous Infusions:  sodium chloride 125 mL/hr at 07/01/23 1103     PRN Medications:  acetaminophen **OR** acetaminophen, guaiFENesin-dextromethorphan, LORazepam, melatonin, nicotine, ondansetron **OR** ondansetron (ZOFRAN) IV, senna-docusate  Antimicrobials from admission:  Anti-infectives (From admission, onward)    Start     Dose/Rate Route Frequency Ordered Stop   06/29/23 1500  cefTRIAXone (ROCEPHIN) 0.5 g in dextrose 5 % 50 mL IVPB        0.5 g 100 mL/hr over 30 Minutes Intravenous  Once 06/29/23 1402 06/29/23 1617   06/29/23 1500  doxycycline (VIBRA-TABS) tablet 100 mg        100 mg Oral Every 12 hours 06/29/23 1402 07/09/23 0959           Data Reviewed:  I have personally reviewed the following...  CBC: Recent Labs  Lab 06/28/23 1123 06/29/23 0613 07/01/23 0634  WBC 17.5* 16.6* 12.1*  HGB 9.1* 8.1* 8.2*  HCT 26.6* 23.5* 23.8*  MCV 94.3 93.6 92.6  PLT 166 144* 179   Basic Metabolic Panel: Recent Labs  Lab 06/28/23 1123 06/28/23 1232 06/29/23 0613 06/30/23 0626 07/01/23 0634  NA 132*  --  132* 133* 136  K 4.0  --  4.1 4.0 4.3  CL 98  --  104 107 111  CO2 20*  --  16* 15* 16*  GLUCOSE 140*  --   101* 116* 100*  BUN 34*  --  46* 60* 72*  CREATININE 4.18*  --  5.88* 8.06* 9.46*  CALCIUM 9.1  --  7.8* 7.7* 8.1*  MG  --  2.3  --   --   --    GFR: Estimated Creatinine Clearance: 10.7 mL/min (A) (by C-G formula based on SCr of 9.46 mg/dL (H)). Liver Function Tests: Recent Labs  Lab 06/28/23 1123 06/29/23 0613 06/30/23 0626 07/01/23 0634  AST 98* 70* 77* 51*  ALT 76* 58* 57* 49*  ALKPHOS 156* 139* 157* 169*  BILITOT 1.9* 1.7* 1.3* 1.2  PROT 7.9 6.8 7.0 6.5  ALBUMIN 3.3* 2.7* 2.6* 2.5*   No results for input(s): "LIPASE", "AMYLASE" in the last 168 hours. No results for input(s): "AMMONIA" in the last 168 hours. Coagulation Profile: Recent Labs  Lab 06/28/23 1232  INR 1.2   Cardiac Enzymes: Recent Labs  Lab 06/28/23 1232 06/29/23 0613  CKTOTAL 109 106   BNP (last 3 results) No results for input(s): "PROBNP" in the last 8760 hours. HbA1C: No results for input(s): "HGBA1C" in the last 72 hours. CBG: No results for input(s): "GLUCAP" in the last 168 hours. Lipid Profile: No results for input(s): "CHOL", "HDL", "LDLCALC", "TRIG", "CHOLHDL", "LDLDIRECT" in the last 72 hours. Thyroid Function Tests: No results for input(s): "TSH", "T4TOTAL", "FREET4", "T3FREE", "THYROIDAB" in the last 72 hours. Anemia Panel: No results for input(s): "VITAMINB12", "FOLATE", "FERRITIN", "TIBC", "IRON", "RETICCTPCT" in the last 72 hours. Most Recent Urinalysis On File:     Component Value Date/Time   COLORURINE YELLOW (A) 06/29/2023 1042   APPEARANCEUR CLOUDY (A) 06/29/2023 1042   APPEARANCEUR Turbid (A) 11/05/2018 0000   LABSPEC 1.010 06/29/2023 1042   PHURINE 5.0 06/29/2023 1042   GLUCOSEU NEGATIVE 06/29/2023 1042   HGBUR MODERATE (A) 06/29/2023 1042   BILIRUBINUR NEGATIVE 06/29/2023 1042   BILIRUBINUR Negative  11/05/2018 0000   KETONESUR NEGATIVE 06/29/2023 1042   PROTEINUR 100 (A) 06/29/2023 1042   NITRITE NEGATIVE 06/29/2023 1042   LEUKOCYTESUR NEGATIVE 06/29/2023 1042    Sepsis Labs: @LABRCNTIP (procalcitonin:4,lacticidven:4) Microbiology: Recent Results (from the past 240 hour(s))  Blood culture (single)     Status: None (Preliminary result)   Collection Time: 06/28/23 12:22 PM   Specimen: BLOOD  Result Value Ref Range Status   Specimen Description BLOOD LEFT ANTECUBITAL  Final   Special Requests   Final    BOTTLES DRAWN AEROBIC AND ANAEROBIC Blood Culture adequate volume   Culture   Final    NO GROWTH 3 DAYS Performed at [facility], [address], [place], [place] [address]    Report Status PENDING  Incomplete  SARS Coronavirus 2 by RT PCR (hospital order, performed in [facility] hospital lab) *cepheid single result test* Anterior Nasal Swab     Status: None   Collection Time: 06/29/23 12:15 AM   Specimen: Anterior Nasal Swab  Result Value Ref Range Status   SARS Coronavirus 2 by RT PCR NEGATIVE NEGATIVE Final    Comment: (NOTE) SARS-CoV-2 target nucleic acids are NOT DETECTED.  The SARS-CoV-2 RNA is generally detectable in upper and lower respiratory specimens during the acute phase of infection. The lowest concentration of SARS-CoV-2 viral copies this assay can detect is 250 copies / mL. A negative result does not preclude SARS-CoV-2 infection and should not be used as the sole basis for treatment or other patient management decisions.  A negative result may occur with improper specimen collection / handling, submission of specimen other than nasopharyngeal swab, presence of viral mutation(s) within the areas targeted by this assay, and inadequate number of viral copies (<250 copies / mL). A negative result must be combined with clinical observations, patient history, and epidemiological information.  Fact Sheet for Patients:   RoadLapTop.co.za  Fact Sheet for Healthcare Providers: http://kim-miller.com/  This test is not yet approved or  cleared by the [place] FDA  and has been authorized for detection and/or diagnosis of SARS-CoV-2 by FDA under an Emergency Use Authorization (EUA).  This EUA will remain in effect (meaning this test can be used) for the duration of the COVID-19 declaration under Section 564(b)(1) of the Act, 21 U.S.C. section 360bbb-3(b)(1), unless the authorization is terminated or revoked sooner.  Performed at [facility], [address] [place], [place] [address]   Chlamydia/NGC rt PCR [facility] only)     Status: None   Collection Time: 06/29/23 10:42 AM   Specimen: Urine; GU  Result Value Ref Range Status   Specimen source GC/Chlam URINE, RANDOM  Final   Chlamydia Tr NOT DETECTED NOT DETECTED Final   N gonorrhoeae NOT DETECTED NOT DETECTED Final    Comment: (NOTE) This CT/NG assay has not been evaluated in patients with a history of  hysterectomy. Performed at [facility], [address] [place], [place] [address]       Radiology Studies last 3 days: US SCROTUM W/DOPPLER  Result Date: 06/29/2023 CLINICAL DATA:  Scrotal pain and swelling for 4 days. EXAM: SCROTAL ULTRASOUND DOPPLER ULTRASOUND OF THE TESTICLES TECHNIQUE: Complete ultrasound examination of the testicles, epididymis, and other scrotal structures was performed. Color and spectral Doppler ultrasound were also utilized to evaluate blood flow to the testicles. COMPARISON:  None Available. FINDINGS: Right testicle Measurements: 4.7 x 2.2 x 3.1 cm. No mass or microlithiasis visualized. Left testicle Measurements: 4.6 x 2.4 x 2.8 cm. No mass or  microlithiasis visualized. Right epididymis:  Normal in size and appearance. Left epididymis: Normal in size and vascularity. There is a 4 x 8 x 7 mm cyst in the left epididymis. Hydrocele:  Small bilateral hydroceles are present. Varicocele:  Left-sided varicocele is present. Pulsed Doppler interrogation of both testes demonstrates normal low resistance arterial and venous waveforms bilaterally. IMPRESSION: 1.  No evidence of testicular torsion. 2. Small bilateral hydroceles. 3. Left-sided varicocele. 4. 8 mm left epididymal cyst. Electronically Signed   By: Darliss Cheney M.D.   On: 06/29/2023 16:36   US RENAL  Result Date: 06/28/2023 CLINICAL DATA:  Acute kidney injury EXAM: RENAL / URINARY TRACT ULTRASOUND COMPLETE COMPARISON:  None Available. FINDINGS: Right Kidney: Renal measurements: 11.4 x 5.0 x 5.6 cm = volume: 167.7 mL. Echogenicity within normal limits. No mass or hydronephrosis visualized. Left Kidney: Renal measurements: 12.2 x 6.9 x 6.5 cm = volume: 287.1 mL. Echogenicity within normal limits. No mass or hydronephrosis visualized. Bladder: Appears normal for degree of bladder distention. Other: None. IMPRESSION: No collecting system dilatation. Electronically Signed   By: Karen Kays M.D.   On: 06/28/2023 16:42   US ABDOMEN LIMITED RUQ (LIVER/GB)  Result Date: 06/28/2023 CLINICAL DATA:  Right upper quadrant pain for 2 days EXAM: ULTRASOUND ABDOMEN LIMITED RIGHT UPPER QUADRANT COMPARISON:  Ultrasound 02/21/2023 FINDINGS: Gallbladder: Mildly distended gallbladder. No shadowing stones. There is mild gallbladder wall thickening of 5 mm of uncertain etiology. There is also a hypoechoic area along the margin of the gallbladder measuring 19 x 7 x 5 mm. Underlying lesion is possible. No blood flow to this area on Doppler. Common bile duct: Diameter: 4 mm Liver: Diffusely echogenic hepatic parenchyma consistent with fatty liver infiltration. Portal vein is patent on color Doppler imaging with normal direction of blood flow towards the liver. Other: None. IMPRESSION: No gallstones. However gallbladder wall has a diffuse thickening with a focal nodular area measuring up to 19 by 7 x 5 mm. Underlying lesion is possible. Please correlate with any prior or follow up MRI with and without contrast when appropriate to further delineate. No biliary ductal dilatation.  Fatty liver infiltration. Electronically Signed    By: Karen Kays M.D.   On: 06/28/2023 15:06   DG Chest 2 View  Result Date: 06/28/2023 CLINICAL DATA:  Shortness of breath EXAM: CHEST - 2 VIEW COMPARISON:  None Available. FINDINGS: The heart size and mediastinal contours are within normal limits. No consolidation, pneumothorax or effusion. No edema. The visualized skeletal structures are unremarkable. IMPRESSION: No acute cardiopulmonary disease. Electronically Signed   By: Karen Kays M.D.   On: 06/28/2023 12:49             LOS: 3 days      Sunnie Nielsen, DO [facility] 07/01/2023, 2:58 PM    Dictation software may have been used to generate the above note. Typos may occur and escape review in typed/dictated notes. Please contact Dr Lyn Hollingshead directly for clarity if needed.  Staff may message me via secure chat in Epic  but this may not receive an immediate response,  please page me for urgent matters!  If 7PM-7AM, please contact night coverage [email or web address]

## 2023-07-01 NOTE — Progress Notes (Addendum)
[facility]  ROUNDING NOTE   Subjective:   Patient seen laying in bed, alert and oriented Reports poor appetite due to food distaste Remains on room air No lower extremity edema Reports mild constipation   Creatinine 9.46 Urine output 600 mL plus 2 occurrences  Objective:  Vital signs in last 24 hours:  Temp:  [97.6 F (36.4 C)-98 F (36.7 C)] 97.6 F (36.4 C) (09/23 0914) Pulse Rate:  [65-75] 65 (09/23 0914) Resp:  [18] 18 (09/23 0914) BP: (108-126)/(60-76) 126/66 (09/23 0914) SpO2:  [97 %-99 %] 99 % (09/23 0914)  Weight change:  Filed Weights   06/28/23 1121  Weight: 96 kg    Intake/Output: I/O last 3 completed shifts: In: 3034.6 [P.O.:600; I.V.:2434.6] Out: 600 [Urine:600]   Intake/Output this shift:  Total I/O In: -  Out: 200 [Urine:200]  Physical Exam: General: NAD  Head: Normocephalic, atraumatic. Moist oral mucosal membranes  Eyes: Anicteric  Lungs:  Clear to auscultation normal effort  Heart: Regular rate and rhythm  Abdomen:  Soft, nontender,   Extremities: trace peripheral edema.  Neurologic: Nonfocal, moving all four extremities  Skin: No lesions.  Scrotal edema and tenderness  Access: None    Basic Metabolic Panel: Recent Labs  Lab 06/28/23 1123 06/28/23 1232 06/29/23 0613 06/30/23 0626 07/01/23 0634  NA 132*  --  132* 133* 136  K 4.0  --  4.1 4.0 4.3  CL 98  --  104 107 111  CO2 20*  --  16* 15* 16*  GLUCOSE 140*  --  101* 116* 100*  BUN 34*  --  46* 60* 72*  CREATININE 4.18*  --  5.88* 8.06* 9.46*  CALCIUM 9.1  --  7.8* 7.7* 8.1*  MG  --  2.3  --   --   --     Liver Function Tests: Recent Labs  Lab 06/28/23 1123 06/29/23 0613 06/30/23 0626 07/01/23 0634  AST 98* 70* 77* 51*  ALT 76* 58* 57* 49*  ALKPHOS 156* 139* 157* 169*  BILITOT 1.9* 1.7* 1.3* 1.2  PROT 7.9 6.8 7.0 6.5  ALBUMIN 3.3* 2.7* 2.6* 2.5*   No results for input(s): "LIPASE", "AMYLASE" in the last 168 hours. No results for input(s):  "AMMONIA" in the last 168 hours.  CBC: Recent Labs  Lab 06/28/23 1123 06/29/23 0613 07/01/23 0634  WBC 17.5* 16.6* 12.1*  HGB 9.1* 8.1* 8.2*  HCT 26.6* 23.5* 23.8*  MCV 94.3 93.6 92.6  PLT 166 144* 179    Cardiac Enzymes: Recent Labs  Lab 06/28/23 1232 06/29/23 0613  CKTOTAL 109 106    BNP: Invalid input(s): "POCBNP"  CBG: No results for input(s): "GLUCAP" in the last 168 hours.  Microbiology: Results for orders placed or performed during the hospital encounter of 06/28/23  Blood culture (single)     Status: None (Preliminary result)   Collection Time: 06/28/23 12:22 PM   Specimen: BLOOD  Result Value Ref Range Status   Specimen Description BLOOD LEFT ANTECUBITAL  Final   Special Requests   Final    BOTTLES DRAWN AEROBIC AND ANAEROBIC Blood Culture adequate volume   Culture   Final    NO GROWTH 3 DAYS Performed at [facility], [address] [place], [place] [address]    Report Status PENDING  Incomplete  SARS Coronavirus 2 by RT PCR (hospital order, performed in [facility] hospital lab) *cepheid single result test* Anterior Nasal Swab     Status: None   Collection Time: 06/29/23 12:15  AM   Specimen: Anterior Nasal Swab  Result Value Ref Range Status   SARS Coronavirus 2 by RT PCR NEGATIVE NEGATIVE Final    Comment: (NOTE) SARS-CoV-2 target nucleic acids are NOT DETECTED.  The SARS-CoV-2 RNA is generally detectable in upper and lower respiratory specimens during the acute phase of infection. The lowest concentration of SARS-CoV-2 viral copies this assay can detect is 250 copies / mL. A negative result does not preclude SARS-CoV-2 infection and should not be used as the sole basis for treatment or other patient management decisions.  A negative result may occur with improper specimen collection / handling, submission of specimen other than nasopharyngeal swab, presence of viral mutation(s) within the areas targeted by this assay, and inadequate  number of viral copies (<250 copies / mL). A negative result must be combined with clinical observations, patient history, and epidemiological information.  Fact Sheet for Patients:   RoadLapTop.co.za  Fact Sheet for Healthcare Providers: http://kim-miller.com/  This test is not yet approved or  cleared by the [place] FDA and has been authorized for detection and/or diagnosis of SARS-CoV-2 by FDA under an Emergency Use Authorization (EUA).  This EUA will remain in effect (meaning this test can be used) for the duration of the COVID-19 declaration under Section 564(b)(1) of the Act, 21 U.S.C. section 360bbb-3(b)(1), unless the authorization is terminated or revoked sooner.  Performed at [facility], [address] [place], [place] [address]   Chlamydia/NGC rt PCR [facility] only)     Status: None   Collection Time: 06/29/23 10:42 AM   Specimen: Urine; GU  Result Value Ref Range Status   Specimen source GC/Chlam URINE, RANDOM  Final   Chlamydia Tr NOT DETECTED NOT DETECTED Final   N gonorrhoeae NOT DETECTED NOT DETECTED Final    Comment: (NOTE) This CT/NG assay has not been evaluated in patients with a history of  hysterectomy. Performed at [facility], [address] [place], [place] [address]     Coagulation Studies: Recent Labs    06/28/23 1232  LABPROT 15.7*  INR 1.2    Urinalysis: Recent Labs    06/29/23 1042  COLORURINE YELLOW*  LABSPEC 1.010  PHURINE 5.0  GLUCOSEU NEGATIVE  HGBUR MODERATE*  BILIRUBINUR NEGATIVE  KETONESUR NEGATIVE  PROTEINUR 100*  NITRITE NEGATIVE  LEUKOCYTESUR NEGATIVE      Imaging: US SCROTUM W/DOPPLER  Result Date: 06/29/2023 CLINICAL DATA:  Scrotal pain and swelling for 4 days. EXAM: SCROTAL ULTRASOUND DOPPLER ULTRASOUND OF THE TESTICLES TECHNIQUE: Complete ultrasound examination of the testicles, epididymis, and other scrotal structures was performed. Color  and spectral Doppler ultrasound were also utilized to evaluate blood flow to the testicles. COMPARISON:  None Available. FINDINGS: Right testicle Measurements: 4.7 x 2.2 x 3.1 cm. No mass or microlithiasis visualized. Left testicle Measurements: 4.6 x 2.4 x 2.8 cm. No mass or microlithiasis visualized. Right epididymis:  Normal in size and appearance. Left epididymis: Normal in size and vascularity. There is a 4 x 8 x 7 mm cyst in the left epididymis. Hydrocele:  Small bilateral hydroceles are present. Varicocele:  Left-sided varicocele is present. Pulsed Doppler interrogation of both testes demonstrates normal low resistance arterial and venous waveforms bilaterally. IMPRESSION: 1. No evidence of testicular torsion. 2. Small bilateral hydroceles. 3. Left-sided varicocele. 4. 8 mm left epididymal cyst. Electronically Signed   By: Darliss Cheney M.D.   On: 06/29/2023 16:36     Medications:    sodium chloride 125 mL/hr at 07/01/23 1103  amLODipine  10 mg Oral QPM   aspirin EC  81 mg Oral Daily   carvedilol  25 mg Oral BID WC   clopidogrel  75 mg Oral Daily   doxycycline  100 mg Oral Q12H   folic acid  1 mg Oral Daily   heparin  5,000 Units Subcutaneous Q8H   levETIRAcetam  500 mg Oral BID   nystatin   Topical BID   pantoprazole  40 mg Oral Daily   acetaminophen **OR** acetaminophen, guaiFENesin-dextromethorphan, LORazepam, melatonin, nicotine, ondansetron **OR** ondansetron (ZOFRAN) IV, senna-docusate  Assessment/ Plan:  [name] is a 52 y.o.  male with medical problems of hypertension, alcohol abuse, hyperlipidemia, history of TIA, GERD, erectile dysfunction, tobacco abuse, history of seizures  was admitted on 06/28/2023 for Shortness of breath [R06.02] SOB (shortness of breath) [R06.02] AKI (acute kidney injury) (HCC) [N17.9] Anemia, unspecified type [D64.9]   Acute kidney injury on chronic kidney disease stage IIIa. AKI secondary to severe ATN with chronic NSAID  use Hematuria Proteinuria. Elevated liver enzymes Hypoalbuminemia Fatty liver Alcohol abuse Current smoker Scrotal Abscess  Lab Results  Component Value Date   CREATININE 9.46 (H) 07/01/2023   CREATININE 8.06 (H) 06/30/2023   CREATININE 5.88 (H) 06/29/2023    Intake/Output Summary (Last 24 hours) at 07/01/2023 1201 Last data filed at 07/01/2023 1115 Gross per 24 hour  Intake 871.84 ml  Output 800 ml  Net 71.84 ml   Baseline creatinine of 1.61, GFR 51 from 06/13/2023.  Presenting creatinine of 4.18, GFR 16.  In addition patient has elevated liver enzymes suggesting hepatitis possibly alcoholic hepatitis.  Albumin is low.  Diffuse echogenic liver consistent with fatty infiltration. Urinalysis with moderate blood, 0-5 RBCs, 0-5 WBCs, 100 mg of protein. Renal ultrasound is negative for obstruction, mass.   Differential diagnosis includes nonsteroidals induced AKI, FSGS, glomerulonephritis, infection related AKI from epididymitis.  Protein creatinine ratio 2.23, CK 106, UDS negative   Plan: Creatinine 9.46, BUN 72.  Questionable uremic symptoms with loss of appetite and food distaste. Urology consult to evaluate epididymal mass with questionable purulent drainage. Receiving doxycycline. Continue IVF Will order lactulose once No acute indication for dialysis however if renal function continues to deteriorate, discussed with patient the possible need to initiate temporary hemodialysis tomorrow.    LOS: 3 Yoshimi Sarr 9/23/202412:01 PM

## 2023-07-01 NOTE — Plan of Care (Signed)
Problem: Skin Integrity: Goal: Risk for impaired skin integrity will decrease Outcome: Progressing   Problem: Safety: Goal: Ability to remain free from injury will improve Outcome: Progressing   Problem: Pain Managment: Goal: General experience of comfort will improve Outcome: Progressing   Problem: Elimination: Goal: Will not experience complications related to bowel motility Outcome: Progressing   Problem: Activity: Goal: Risk for activity intolerance will decrease Outcome: Progressing   Problem: Nutrition: Goal: Adequate nutrition will be maintained Outcome: Progressing

## 2023-07-02 ENCOUNTER — Encounter
Admission: EM | Discharge status: Against Medical Advice | Payer: Self-pay | Source: Home / Self Care | Attending: Student

## 2023-07-02 DIAGNOSIS — N186 End stage renal disease: Secondary | ICD-10-CM

## 2023-07-02 DIAGNOSIS — Z992 Dependence on renal dialysis: Secondary | ICD-10-CM

## 2023-07-02 HISTORY — PX: DIALYSIS/PERMA CATHETER INSERTION: CATH118288

## 2023-07-02 LAB — COMPREHENSIVE METABOLIC PANEL
ALT: 44 U/L (ref 0–44)
AST: 37 U/L (ref 15–41)
Albumin: 2.5 g/dL — ABNORMAL LOW (ref 3.5–5.0)
Alkaline Phosphatase: 159 U/L — ABNORMAL HIGH (ref 38–126)
Anion gap: 12 (ref 5–15)
BUN: 81 mg/dL — ABNORMAL HIGH (ref 6–20)
CO2: 13 mmol/L — ABNORMAL LOW (ref 22–32)
Calcium: 8 mg/dL — ABNORMAL LOW (ref 8.9–10.3)
Chloride: 108 mmol/L (ref 98–111)
Creatinine, Ser: 9.86 mg/dL — ABNORMAL HIGH (ref 0.61–1.24)
GFR, Estimated: 6 mL/min — ABNORMAL LOW (ref >60)
Glucose, Bld: 94 mg/dL (ref 70–99)
Potassium: 4.2 mmol/L (ref 3.5–5.1)
Sodium: 133 mmol/L — ABNORMAL LOW (ref 135–145)
Total Bilirubin: 1.1 mg/dL (ref 0.3–1.2)
Total Protein: 6.7 g/dL (ref 6.5–8.1)

## 2023-07-02 LAB — PROTEIN ELECTROPHORESIS, SERUM
A/G Ratio: 0.7 (ref 0.7–1.7)
Albumin ELP: 2.4 g/dL — ABNORMAL LOW (ref 2.9–4.4)
Alpha-1-Globulin: 0.5 g/dL — ABNORMAL HIGH (ref 0.0–0.4)
Alpha-2-Globulin: 0.9 g/dL (ref 0.4–1.0)
Beta Globulin: 1 g/dL (ref 0.7–1.3)
Gamma Globulin: 1 g/dL (ref 0.4–1.8)
Globulin, Total: 3.4 g/dL (ref 2.2–3.9)
Total Protein ELP: 5.8 g/dL — ABNORMAL LOW (ref 6.0–8.5)

## 2023-07-02 LAB — HCV INTERPRETATION

## 2023-07-02 LAB — KAPPA/LAMBDA LIGHT CHAINS
Kappa free light chain: 127.7 mg/L — ABNORMAL HIGH (ref 3.3–19.4)
Kappa, lambda light chain ratio: 1.23 (ref 0.26–1.65)
Lambda free light chains: 103.5 mg/L — ABNORMAL HIGH (ref 5.7–26.3)

## 2023-07-02 LAB — HCV AB W REFLEX TO QUANT PCR: HCV Ab: NONREACTIVE

## 2023-07-02 LAB — C4 COMPLEMENT: Complement C4, Body Fluid: 23 mg/dL (ref 12–38)

## 2023-07-02 LAB — C3 COMPLEMENT: C3 Complement: 195 mg/dL — ABNORMAL HIGH (ref 82–167)

## 2023-07-02 LAB — HEPATITIS B SURFACE ANTIBODY, QUANTITATIVE: Hep B S AB Quant (Post): <3.5 m[IU]/mL — ABNORMAL LOW

## 2023-07-02 SURGERY — DIALYSIS/PERMA CATHETER INSERTION
Anesthesia: Moderate Sedation

## 2023-07-02 MED ORDER — CEFAZOLIN SODIUM-DEXTROSE 2-4 GM/100ML-% IV SOLN: 2.0000 g | INTRAVENOUS | Status: DC

## 2023-07-02 MED ORDER — FENTANYL CITRATE (PF) 100 MCG/2ML IJ SOLN
INTRAMUSCULAR | Status: DC | PRN
  Administered 2023-07-02: 50 ug via INTRAVENOUS

## 2023-07-02 MED ORDER — CEFAZOLIN SODIUM-DEXTROSE 1-4 GM/50ML-% IV SOLN: 1.0000 g | INTRAVENOUS | Status: DC

## 2023-07-02 MED ORDER — HYDROMORPHONE HCL 1 MG/ML IJ SOLN
1.0000 mg | Freq: Once | INTRAMUSCULAR | Status: AC | PRN
  Administered 2023-07-02: 1 mg via INTRAVENOUS
  Filled 2023-07-02: qty 1

## 2023-07-02 MED ORDER — MIDAZOLAM HCL 2 MG/ML PO SYRP: 8.0000 mg | ORAL_SOLUTION | Freq: Once | ORAL | Status: DC | PRN

## 2023-07-02 MED ORDER — FENTANYL CITRATE PF 50 MCG/ML IJ SOSY: 12.5000 ug | PREFILLED_SYRINGE | Freq: Once | INTRAMUSCULAR | Status: DC | PRN

## 2023-07-02 MED ORDER — FAMOTIDINE 20 MG PO TABS: 40.0000 mg | ORAL_TABLET | Freq: Once | ORAL | Status: DC | PRN

## 2023-07-02 MED ORDER — METHYLPREDNISOLONE SODIUM SUCC 125 MG IJ SOLR: 125.0000 mg | Freq: Once | INTRAMUSCULAR | Status: DC | PRN

## 2023-07-02 MED ORDER — LIDOCAINE-EPINEPHRINE (PF) 1 %-1:200000 IJ SOLN
INTRAMUSCULAR | Status: DC | PRN
  Administered 2023-07-02: 20 mL

## 2023-07-02 MED ORDER — HEPARIN SODIUM (PORCINE) 10000 UNIT/ML IJ SOLN
INTRAMUSCULAR | Status: AC
  Filled 2023-07-02: qty 1

## 2023-07-02 MED ORDER — SODIUM CHLORIDE 0.9 % IV SOLN: INTRAVENOUS | Status: DC

## 2023-07-02 MED ORDER — CHLORHEXIDINE GLUCONATE CLOTH 2 % EX PADS
6.0000 | MEDICATED_PAD | Freq: Every day | CUTANEOUS | Status: DC
  Administered 2023-07-03 – 2023-07-10 (×8): 6 via TOPICAL

## 2023-07-02 MED ORDER — ONDANSETRON HCL 4 MG/2ML IJ SOLN: 4.0000 mg | Freq: Four times a day (QID) | INTRAMUSCULAR | Status: DC | PRN

## 2023-07-02 MED ORDER — FENTANYL CITRATE (PF) 100 MCG/2ML IJ SOLN
INTRAMUSCULAR | Status: AC
  Filled 2023-07-02: qty 2

## 2023-07-02 MED ORDER — CEFAZOLIN SODIUM-DEXTROSE 1-4 GM/50ML-% IV SOLN
1.0000 g | INTRAVENOUS | Status: DC
  Administered 2023-07-02: 1 g via INTRAVENOUS
  Filled 2023-07-02: qty 50

## 2023-07-02 MED ORDER — ALTEPLASE 2 MG IJ SOLR: 2.0000 mg | Freq: Once | INTRAMUSCULAR | Status: DC | PRN

## 2023-07-02 MED ORDER — DIPHENHYDRAMINE HCL 50 MG/ML IJ SOLN: 50.0000 mg | Freq: Once | INTRAMUSCULAR | Status: DC | PRN

## 2023-07-02 MED ORDER — HEPARIN (PORCINE) IN NACL 1000-0.9 UT/500ML-% IV SOLN
INTRAVENOUS | Status: DC | PRN
  Administered 2023-07-02: 500 mL

## 2023-07-02 MED ORDER — HYDROMORPHONE HCL 1 MG/ML IJ SOLN: 1.0000 mg | Freq: Once | INTRAMUSCULAR | Status: DC | PRN

## 2023-07-02 MED ORDER — FENTANYL CITRATE (PF) 100 MCG/2ML IJ SOLN: 12.5000 ug | Freq: Once | INTRAMUSCULAR | Status: DC | PRN

## 2023-07-02 MED ORDER — MIDAZOLAM HCL 2 MG/2ML IJ SOLN
INTRAMUSCULAR | Status: DC | PRN
  Administered 2023-07-02: 2 mg via INTRAVENOUS

## 2023-07-02 MED ORDER — CEFAZOLIN SODIUM-DEXTROSE 1-4 GM/50ML-% IV SOLN
INTRAVENOUS | Status: AC
  Filled 2023-07-02: qty 50

## 2023-07-02 MED ORDER — HEPARIN SODIUM (PORCINE) 1000 UNIT/ML DIALYSIS
1000.0000 [IU] | INTRAMUSCULAR | Status: DC | PRN
  Filled 2023-07-02: qty 1

## 2023-07-02 MED ORDER — HEPARIN SODIUM (PORCINE) 10000 UNIT/ML IJ SOLN
INTRAMUSCULAR | Status: DC | PRN
  Administered 2023-07-02: 10000 [IU]

## 2023-07-02 MED ORDER — MIDAZOLAM HCL 5 MG/5ML IJ SOLN
INTRAMUSCULAR | Status: AC
  Filled 2023-07-02: qty 5

## 2023-07-02 SURGICAL SUPPLY — 7 items
ADH SKN CLS APL DERMABOND .7 (GAUZE/BANDAGES/DRESSINGS) ×1
CATH CANNON HEMO 15FR 19 (HEMODIALYSIS SUPPLIES) IMPLANT
COVER PROBE ULTRASOUND 5X96 (MISCELLANEOUS) IMPLANT
DERMABOND ADVANCED .7 DNX12 (GAUZE/BANDAGES/DRESSINGS) IMPLANT
PACK ANGIOGRAPHY (CUSTOM PROCEDURE TRAY) IMPLANT
SUT MNCRL AB 4-0 PS2 18 (SUTURE) IMPLANT
SUT PROLENE 0 CT 1 30 (SUTURE) IMPLANT

## 2023-07-02 NOTE — Interval H&P Note (Signed)
History and Physical Interval Note:  07/02/2023 2:12 PM  Gary [name]  has presented today for surgery, with the diagnosis of ESRD.  The various methods of treatment have been discussed with the patient and family. After consideration of risks, benefits and other options for treatment, the patient has consented to  Procedure(s): DIALYSIS/PERMA CATHETER INSERTION (N/A) as a surgical intervention.  The patient's history has been reviewed, patient examined, no change in status, stable for surgery.  I have reviewed the patient's chart and labs.  Questions were answered to the patient's satisfaction.     Festus Barren

## 2023-07-02 NOTE — Progress Notes (Signed)
PROGRESS NOTE    Gary [name]   ZOX:[id number] DOB: 13-Nov-1970  DOA: 06/28/2023 Date of Service: 07/02/23 PCP: Rolm Gala, NP     Brief Narrative / Hospital Course:  Gary [name] is a 52 year old male with history of alcohol abuse, hypertension, hyperlipidemia, history of TIA, GERD, erectile dysfunction, tobacco use, history of seizure, who presents to the emergency department for chief concerns of shortness of breath and dizziness. 09/20: in ED, VSS, mild hyponatremia sodium 132, significantly elevated creatinine 4.18 w/ GFR 16, elevated LFT  AST 98 and ALT 76, Bili 1.9, ALP 156, BNP WNL, Troponin and CK WNL, WBC elevated 17.5, Hgb 9.1. CXR nonacute. Renal US normal no dilatation, Abd [place] (+)fatty liver, GB w/ diffuse wall thickening and focal nodular area measuring up to 19 by 7 x 5 mm concern for underlying lesion and advise compare w/ MRI. Given Protonix 80 mg IV one-time dose, sodium chloride 1 L bolus. Admitted to hospitalist service.  09/21: SOB improved, renal function worse Cr to 5.88, consulted to nephrology. Pt c/o scrotal pain, [place] (+)small bl hydrocele L varicocele L epididymal cyst - started tx for epididymitis given leukocytosis  09/22: early AM, scrotum apparently opened to drain - pt describes clear thin liquid, bandage appears serous fluid, no purulent, no erythema to skin - monitor. Cr worse, up to 8, nephrology following, continuing IV fluids.  09/23: Cr still worsening and may need to consider dialysis tomorrow, urology consult repeating US - no abscess 09/24: permcath placement and dialysis after that  Consultants:  Nephrology  Vascular surgery Urology   Procedures: none      ASSESSMENT & PLAN:   AKI (acute kidney injury) on CKD3a - worsening --> acute renal faillure  suspect multifactorial including prerenal in setting of poor p.o./fluid intake, NSAID use,  glomerulonephritis, rhabdomyolysis, infection related AKI from epididymitis.  No  concerns on renal US  UDS neg CK WNL Monitor BMP --> worsening  Strict I&O Nephrology following  --> recs for HD, permcath today and HD this afternoon  Pending ANA, ANCA, C3, C4, kappa/lambda, PTH, protein electrophoresis NS continuous at 125 mL/h   Shortness of breath without respiratory failure - resolved  Seems likely to be respiratory compensation for metabolic acidosis d/t renal failure  Differentials would include COPD history of tobacco use  CXR personally reviewed, no concerns  Neg troponin, ACS unlikely   Gallbladder mass - nodular 19 mm x 7 mm x 5 mm on RUQ [place] Associated w/ elevation in Bili, AST, ALT, ALP Risk of gallbladder carcinoma given the size read  before surgical intervention is considered, need further imaging  MRCP with and without contrast has been deferred on admission due to acute kidney injury, but plan for this if/when pt can tolerate contrast  Elevated liver enzymes Suspect secondary to gallbladder disease as above, complicated by alcohol abuse  Monitor CMP, LFT Hepatitis panel neg    History Seizures Calculated creatinine clearance is 13 mL/min on admission Patient on Keppra 1000 mg p.o. twice daily at home. Placed on Keppra reduced dosing to 500 mg p.o. twice daily in setting of acute kidney injury   Scrotal pain w/  Small bilateral hydrocele Left-sided varicocele 8 mm left epididymal cyst. Leukocytosis Ceftriaxone x1 + doxycycline 100 mg bid x10 days (FQ would cover enteric pathogens but pt not sexually active for some time, FQ renal dosing problematic, will give doxy) started 09/21 Check for GC/CT in urine --> neg  Frequent dressing changes Urology following  Hyponatremia -  stable, mild Likely d/t renal failure  Follow BMP   Metabolic acidosis Mild, secondary to acute kidney injury Sodium bicarbonate 325 mg p.o. twice daily, 2 doses ordered   History CVA Statin held for now given elevation LFT  Plavix   HLD  04/25/2023, cardiologist  discontinued Pravachol and started patient on Crestor 40 mg w/ improvement in cholesterol and AST/ALT.  Hold statin for now w/ elevated LFT   Essential HTN On home amlodipine, carvedilol   GERD  PPI  Insomnia Melatonin 5 mg nightly as needed for sleep   Tobacco abuse As needed nicotine patch ordered      Obesity based on BMI: Body mass index is 30.37 kg/m.   DVT prophylaxis: heparin  IV fluids: NS continuous IV fluids per nephrology  Nutrition: renal/carb Central lines / invasive devices: none  Code Status: FULL CODE ACP documentation reviewed: 06/29/23 and none on file in [facility]  TOC needs: none at this time Barriers to dispo / significant pending items: renal function improvement               Subjective / Brief ROS:  Denies CP/SOB Pain controlled.  Denies new weakness.  Tolerating diet.    Family Communication: none at this time     Objective Findings:  Vitals:   07/01/23 0914 07/01/23 1534 07/01/23 2319 07/02/23 0838  BP: 126/66 124/80 (!) 129/58 139/67  Pulse: 65 63 64 65  Resp: 18 16 20 14   Temp: 97.6 F (36.4 C)  98.6 F (37 C) 97.6 F (36.4 C)  TempSrc: Oral  Oral   SpO2: 99% 99% 98% 100%  Weight:      Height:        Intake/Output Summary (Last 24 hours) at 07/02/2023 1327 Last data filed at 07/02/2023 0749 Gross per 24 hour  Intake 1077.66 ml  Output 700 ml  Net 377.66 ml   Filed Weights   06/28/23 1121  Weight: 96 kg    Examination:  Physical Exam Constitutional:      General: He is not in acute distress. Cardiovascular:     Rate and Rhythm: Normal rate and regular rhythm.  Pulmonary:     Effort: Pulmonary effort is normal.     Breath sounds: Normal breath sounds.  Musculoskeletal:     Right lower leg: No edema.     Left lower leg: No edema.  Neurological:     General: No focal deficit present.     Mental Status: He is alert and oriented to person, place, and time.  Psychiatric:        Mood and Affect: Mood  normal.        Behavior: Behavior normal.          Scheduled Medications:   amLODipine  10 mg Oral QPM   aspirin EC  81 mg Oral Daily   carvedilol  25 mg Oral BID WC   clopidogrel  75 mg Oral Daily   doxycycline  100 mg Oral Q12H   folic acid  1 mg Oral Daily   heparin  5,000 Units Subcutaneous Q8H   lactulose  20 g Oral Once   levETIRAcetam  500 mg Oral BID   nystatin   Topical BID   pantoprazole  40 mg Oral Daily   thiamine  100 mg Oral Daily    Continuous Infusions:  sodium chloride 125 mL/hr at 07/02/23 0651   sodium chloride      ceFAZolin (ANCEF) IV       PRN  Medications:  acetaminophen **OR** acetaminophen, diphenhydrAMINE, famotidine, fentaNYL (SUBLIMAZE) injection, guaiFENesin-dextromethorphan, HYDROmorphone (DILAUDID) injection, LORazepam, melatonin, methylPREDNISolone (SOLU-MEDROL) injection, midazolam, nicotine, ondansetron **OR** ondansetron (ZOFRAN) IV, ondansetron (ZOFRAN) IV, senna-docusate  Antimicrobials from admission:  Anti-infectives (From admission, onward)    Start     Dose/Rate Route Frequency Ordered Stop   07/02/23 1324  ceFAZolin (ANCEF) IVPB 2g/100 mL premix        2 g 200 mL/hr over 30 Minutes Intravenous 30 min pre-op 07/02/23 1324     07/02/23 1304  ceFAZolin (ANCEF) IVPB 2g/100 mL premix  Status:  Discontinued        2 g 200 mL/hr over 30 Minutes Intravenous 30 min pre-op 07/02/23 1304 07/02/23 1324   06/29/23 1500  cefTRIAXone (ROCEPHIN) 0.5 g in dextrose 5 % 50 mL IVPB        0.5 g 100 mL/hr over 30 Minutes Intravenous  Once 06/29/23 1402 06/29/23 1617   06/29/23 1500  doxycycline (VIBRA-TABS) tablet 100 mg        100 mg Oral Every 12 hours 06/29/23 1402 07/09/23 0959           Data Reviewed:  I have personally reviewed the following...  CBC: Recent Labs  Lab 06/28/23 1123 06/29/23 0613 07/01/23 0634  WBC 17.5* 16.6* 12.1*  HGB 9.1* 8.1* 8.2*  HCT 26.6* 23.5* 23.8*  MCV 94.3 93.6 92.6  PLT 166 144* 179   Basic  Metabolic Panel: Recent Labs  Lab 06/28/23 1123 06/28/23 1232 06/29/23 0613 06/30/23 0626 07/01/23 0634 07/02/23 0258  NA 132*  --  132* 133* 136 133*  K 4.0  --  4.1 4.0 4.3 4.2  CL 98  --  104 107 111 108  CO2 20*  --  16* 15* 16* 13*  GLUCOSE 140*  --  101* 116* 100* 94  BUN 34*  --  46* 60* 72* 81*  CREATININE 4.18*  --  5.88* 8.06* 9.46* 9.86*  CALCIUM 9.1  --  7.8* 7.7* 8.1* 8.0*  MG  --  2.3  --   --   --   --    GFR: Estimated Creatinine Clearance: 10.3 mL/min (A) (by C-G formula based on SCr of 9.86 mg/dL (H)). Liver Function Tests: Recent Labs  Lab 06/28/23 1123 06/29/23 0613 06/30/23 0626 07/01/23 0634 07/02/23 0258  AST 98* 70* 77* 51* 37  ALT 76* 58* 57* 49* 44  ALKPHOS 156* 139* 157* 169* 159*  BILITOT 1.9* 1.7* 1.3* 1.2 1.1  PROT 7.9 6.8 7.0 6.5 6.7  ALBUMIN 3.3* 2.7* 2.6* 2.5* 2.5*   No results for input(s): "LIPASE", "AMYLASE" in the last 168 hours. No results for input(s): "AMMONIA" in the last 168 hours. Coagulation Profile: Recent Labs  Lab 06/28/23 1232  INR 1.2   Cardiac Enzymes: Recent Labs  Lab 06/28/23 1232 06/29/23 0613  CKTOTAL 109 106   BNP (last 3 results) No results for input(s): "PROBNP" in the last 8760 hours. HbA1C: No results for input(s): "HGBA1C" in the last 72 hours. CBG: No results for input(s): "GLUCAP" in the last 168 hours. Lipid Profile: No results for input(s): "CHOL", "HDL", "LDLCALC", "TRIG", "CHOLHDL", "LDLDIRECT" in the last 72 hours. Thyroid Function Tests: No results for input(s): "TSH", "T4TOTAL", "FREET4", "T3FREE", "THYROIDAB" in the last 72 hours. Anemia Panel: No results for input(s): "VITAMINB12", "FOLATE", "FERRITIN", "TIBC", "IRON", "RETICCTPCT" in the last 72 hours. Most Recent Urinalysis On File:     Component Value Date/Time   COLORURINE YELLOW (A) 06/29/2023 1042  APPEARANCEUR CLOUDY (A) 06/29/2023 1042   APPEARANCEUR Turbid (A) 11/05/2018 0000   LABSPEC 1.010 06/29/2023 1042   PHURINE  5.0 06/29/2023 1042   GLUCOSEU NEGATIVE 06/29/2023 1042   HGBUR MODERATE (A) 06/29/2023 1042   BILIRUBINUR NEGATIVE 06/29/2023 1042   BILIRUBINUR Negative 11/05/2018 0000   KETONESUR NEGATIVE 06/29/2023 1042   PROTEINUR 100 (A) 06/29/2023 1042   NITRITE NEGATIVE 06/29/2023 1042   LEUKOCYTESUR NEGATIVE 06/29/2023 1042   Sepsis Labs: @LABRCNTIP (procalcitonin:4,lacticidven:4) Microbiology: Recent Results (from the past 240 hour(s))  Blood culture (single)     Status: None (Preliminary result)   Collection Time: 06/28/23 12:22 PM   Specimen: BLOOD  Result Value Ref Range Status   Specimen Description BLOOD LEFT ANTECUBITAL  Final   Special Requests   Final    BOTTLES DRAWN AEROBIC AND ANAEROBIC Blood Culture adequate volume   Culture   Final    NO GROWTH 4 DAYS Performed at [facility], [address], [place], [place] [address]    Report Status PENDING  Incomplete  SARS Coronavirus 2 by RT PCR (hospital order, performed in [facility] hospital lab) *cepheid single result test* Anterior Nasal Swab     Status: None   Collection Time: 06/29/23 12:15 AM   Specimen: Anterior Nasal Swab  Result Value Ref Range Status   SARS Coronavirus 2 by RT PCR NEGATIVE NEGATIVE Final    Comment: (NOTE) SARS-CoV-2 target nucleic acids are NOT DETECTED.  The SARS-CoV-2 RNA is generally detectable in upper and lower respiratory specimens during the acute phase of infection. The lowest concentration of SARS-CoV-2 viral copies this assay can detect is 250 copies / mL. A negative result does not preclude SARS-CoV-2 infection and should not be used as the sole basis for treatment or other patient management decisions.  A negative result may occur with improper specimen collection / handling, submission of specimen other than nasopharyngeal swab, presence of viral mutation(s) within the areas targeted by this assay, and inadequate number of viral copies (<250 copies / mL). A negative result  must be combined with clinical observations, patient history, and epidemiological information.  Fact Sheet for Patients:   RoadLapTop.co.za  Fact Sheet for Healthcare Providers: http://kim-miller.com/  This test is not yet approved or  cleared by the [place] FDA and has been authorized for detection and/or diagnosis of SARS-CoV-2 by FDA under an Emergency Use Authorization (EUA).  This EUA will remain in effect (meaning this test can be used) for the duration of the COVID-19 declaration under Section 564(b)(1) of the Act, 21 U.S.C. section 360bbb-3(b)(1), unless the authorization is terminated or revoked sooner.  Performed at [facility], [address] [place], [place] [address]   Chlamydia/NGC rt PCR [facility] only)     Status: None   Collection Time: 06/29/23 10:42 AM   Specimen: Urine; GU  Result Value Ref Range Status   Specimen source GC/Chlam URINE, RANDOM  Final   Chlamydia Tr NOT DETECTED NOT DETECTED Final   N gonorrhoeae NOT DETECTED NOT DETECTED Final    Comment: (NOTE) This CT/NG assay has not been evaluated in patients with a history of  hysterectomy. Performed at [facility], [address] [place], [place] [address]       Radiology Studies last 3 days: [place] RT LOWER EXTREM LTD SOFT TISSUE NON VASCULAR  Result Date: 07/01/2023 CLINICAL DATA:  Right inguinal abscess EXAM: ULTRASOUND right LOWER EXTREMITY LIMITED TECHNIQUE: Ultrasound examination of the lower extremity soft tissues was performed in the  area of clinical concern. COMPARISON:  Scrotal ultrasound 06/29/2023 FINDINGS: Targeted sonography in the region of concern described as lateral inferior scrotum is performed. Edematous soft tissues with probable areas of air within. Heterogenous tissue deep to the skin wound contains vascularity and presumably represents inflammatory tissue. No focal fluid collection is seen. IMPRESSION:  Heterogenous tissue deep to the skin wound contains vascularity and presumably represents inflammatory tissue. No focal fluid collection is seen. Electronically Signed   By: Jasmine Pang M.D.   On: 07/01/2023 18:33   US SCROTUM W/DOPPLER  Result Date: 06/29/2023 CLINICAL DATA:  Scrotal pain and swelling for 4 days. EXAM: SCROTAL ULTRASOUND DOPPLER ULTRASOUND OF THE TESTICLES TECHNIQUE: Complete ultrasound examination of the testicles, epididymis, and other scrotal structures was performed. Color and spectral Doppler ultrasound were also utilized to evaluate blood flow to the testicles. COMPARISON:  None Available. FINDINGS: Right testicle Measurements: 4.7 x 2.2 x 3.1 cm. No mass or microlithiasis visualized. Left testicle Measurements: 4.6 x 2.4 x 2.8 cm. No mass or microlithiasis visualized. Right epididymis:  Normal in size and appearance. Left epididymis: Normal in size and vascularity. There is a 4 x 8 x 7 mm cyst in the left epididymis. Hydrocele:  Small bilateral hydroceles are present. Varicocele:  Left-sided varicocele is present. Pulsed Doppler interrogation of both testes demonstrates normal low resistance arterial and venous waveforms bilaterally. IMPRESSION: 1. No evidence of testicular torsion. 2. Small bilateral hydroceles. 3. Left-sided varicocele. 4. 8 mm left epididymal cyst. Electronically Signed   By: Darliss Cheney M.D.   On: 06/29/2023 16:36   US RENAL  Result Date: 06/28/2023 CLINICAL DATA:  Acute kidney injury EXAM: RENAL / URINARY TRACT ULTRASOUND COMPLETE COMPARISON:  None Available. FINDINGS: Right Kidney: Renal measurements: 11.4 x 5.0 x 5.6 cm = volume: 167.7 mL. Echogenicity within normal limits. No mass or hydronephrosis visualized. Left Kidney: Renal measurements: 12.2 x 6.9 x 6.5 cm = volume: 287.1 mL. Echogenicity within normal limits. No mass or hydronephrosis visualized. Bladder: Appears normal for degree of bladder distention. Other: None. IMPRESSION: No collecting system  dilatation. Electronically Signed   By: Karen Kays M.D.   On: 06/28/2023 16:42   US ABDOMEN LIMITED RUQ (LIVER/GB)  Result Date: 06/28/2023 CLINICAL DATA:  Right upper quadrant pain for 2 days EXAM: ULTRASOUND ABDOMEN LIMITED RIGHT UPPER QUADRANT COMPARISON:  Ultrasound 02/21/2023 FINDINGS: Gallbladder: Mildly distended gallbladder. No shadowing stones. There is mild gallbladder wall thickening of 5 mm of uncertain etiology. There is also a hypoechoic area along the margin of the gallbladder measuring 19 x 7 x 5 mm. Underlying lesion is possible. No blood flow to this area on Doppler. Common bile duct: Diameter: 4 mm Liver: Diffusely echogenic hepatic parenchyma consistent with fatty liver infiltration. Portal vein is patent on color Doppler imaging with normal direction of blood flow towards the liver. Other: None. IMPRESSION: No gallstones. However gallbladder wall has a diffuse thickening with a focal nodular area measuring up to 19 by 7 x 5 mm. Underlying lesion is possible. Please correlate with any prior or follow up MRI with and without contrast when appropriate to further delineate. No biliary ductal dilatation.  Fatty liver infiltration. Electronically Signed   By: Karen Kays M.D.   On: 06/28/2023 15:06             LOS: 4 days      Sunnie Nielsen, DO [facility] 07/02/2023, 1:27 PM    Dictation software may have been used to generate the above note. Typos  may occur and escape review in typed/dictated notes. Please contact Dr Lyn Hollingshead directly for clarity if needed.  Staff may message me via secure chat in Epic  but this may not receive an immediate response,  please page me for urgent matters!  If 7PM-7AM, please contact night coverage [email or web address]

## 2023-07-02 NOTE — Consult Note (Signed)
Hospital Consult    Reason for Consult:  Hemodialysis Access Requesting Physician:  Malachi Carl NP MRN #:  [id number]  History of Present Illness: This is a 52 y.o. male with history of alcohol abuse, hypertension, hyperlipidemia, history of TIA, GERD, erectile dysfunction, tobacco use, history of seizure, who presents to the emergency department for chief concerns of shortness of breath and dizziness. Patient was found to have an elevated BUN and Creatinine. Today his BUN is 81 and creatinine of 9.86. Vascular Surgery consulted for dialysis Perma Catheter placement for hemodialysis.    Past Medical History:  Diagnosis Date   Alcohol use    Carotid artery occlusion    GERD (gastroesophageal reflux disease)    Hypercholesteremia    Hypertension    Seizures (HCC)    Stroke (HCC)    TIA 12/29/20, re-admitted 01/14/21 for sequelae   Tobacco abuse     Past Surgical History:  Procedure Laterality Date   NO PAST SURGERIES      No Known Allergies  Prior to Admission medications   Medication Sig Start Date End Date Taking? Authorizing Provider  amLODipine (NORVASC) 10 MG tablet Take 1 tablet (10 mg total) by mouth every evening. 06/20/23  Yes Iloabachie, Chioma E, NP  aspirin 81 MG tablet Take 1 tablet (81 mg total) by mouth daily. 11/01/17  Yes Kallie Locks, FNP  carvedilol (COREG) 25 MG tablet Take 1 tablet (25 mg total) by mouth 2 (two) times daily with a meal. 06/20/23  Yes Iloabachie, Chioma E, NP  clopidogrel (PLAVIX) 75 MG tablet Take 1 tablet (75 mg total) by mouth daily. 06/20/23 06/19/24 Yes Iloabachie, Chioma E, NP  folic acid (FOLVITE) 1 MG tablet Take 1 tablet (1 mg total) by mouth daily. 12/20/22  Yes Iloabachie, Chioma E, NP  ibuprofen (ADVIL) 200 MG tablet Take 800 mg by mouth daily as needed for headache or moderate pain (usually takes daily for headaches, leg pain in am).   Yes [provider]  levETIRAcetam (KEPPRA) 1000 MG tablet Take 1 tablet (1,000 mg  total) by mouth 2 (two) times daily. 06/20/23  Yes Iloabachie, Chioma E, NP  losartan (COZAAR) 100 MG tablet Take 1 tablet (100 mg total) by mouth in the morning. 03/07/23  Yes Iloabachie, Chioma E, NP  magnesium oxide (MAG-OX) 400 MG tablet Take 1 tablet (400 mg total) by mouth daily. 07/12/22  Yes Tukov-Yual, Magdalene S, NP  pantoprazole (PROTONIX) 40 MG tablet Take 1 tablet (40 mg total) by mouth daily. 06/20/23  Yes Iloabachie, Chioma E, NP  rosuvastatin (CRESTOR) 40 MG tablet Take 1 tablet (40 mg total) by mouth daily. 04/25/23 07/28/23 Yes Agbor-Etang, Arlys John, MD  sildenafil (VIAGRA) 100 MG tablet Take 1 tablet 1 hour prior to intercourse. 07/02/22  Yes Stoioff, Verna Czech, MD  thiamine (VITAMIN B-1) 100 MG tablet Take 1 tablet (100 mg total) by mouth daily. 12/20/22  Yes Iloabachie, Chioma E, NP  blood glucose meter kit and supplies KIT Use as directed to check blood glucose 2-3 times per week and as needed 02/22/22   Tukov-Yual, Alroy Bailiff, NP  glucose blood (RIGHTEST GS550 BLOOD GLUCOSE) test strip Use as directed to check blood glucose 2-3 times per week and as needed 02/22/22   Andreas Ohm, NP  Rightest GL300 Lancets MISC Use as directed to check blood glucose 2-3 times per week and as needed 02/22/22   Tukov-Yual, Alroy Bailiff, NP  vitamin B-12 (CYANOCOBALAMIN) 500 MCG tablet Take 2 tablets (1,000 mcg total)  by mouth daily. Patient not taking: Reported on 06/28/2023 01/29/23   Rolm Gala, NP    Social History   Socioeconomic History   Marital status: Divorced    Spouse name: Not on file   Number of children: 4   Years of education: Not on file   Highest education level: High school graduate  Occupational History   Not on file  Tobacco Use   Smoking status: Every Day    Current packs/day: 0.25    Average packs/day: 0.3 packs/day for 34.0 years (8.5 ttl pk-yrs)    Types: Cigarettes   Smokeless tobacco: Never   Tobacco comments:    Pt reports he has thoughts of  quitting, has cut back to 5 cigarettes per day  Vaping Use   Vaping status: Never Used  Substance and Sexual Activity   Alcohol use: Yes    Alcohol/week: 21.0 standard drinks of alcohol    Types: 21 Standard drinks or equivalent per week    Comment: 3-4 mixed drinks daily, 03/07/23 patient states down to 2.5 drinks per day   Drug use: No   Sexual activity: Yes    Birth control/protection: None  Other Topics Concern   Not on file  Social History Narrative   Lives at home with his aunt   Right handed   Drinks no caffeine   Social Determinants of Health   Financial Resource Strain: Low Risk  (06/20/2023)   Overall Financial Resource Strain (CARDIA)    Difficulty of Paying Living Expenses: Not hard at all  Food Insecurity: No Food Insecurity (06/28/2023)   Hunger Vital Sign    Worried About Running Out of Food in the Last Year: Never true    Ran Out of Food in the Last Year: Never true  Transportation Needs: No Transportation Needs (06/28/2023)   PRAPARE - Administrator, Civil Service (Medical): No    Lack of Transportation (Non-Medical): No  Physical Activity: Sufficiently Active (06/20/2023)   Exercise Vital Sign    Days of Exercise per Week: 5 days    Minutes of Exercise per Session: 60 min  Stress: No Stress Concern Present (06/20/2023)   [employer] of Occupational Health - Occupational Stress Questionnaire    Feeling of Stress : Not at all  Social Connections: Unknown (06/20/2023)   Social Connection and Isolation Panel [NHANES]    Frequency of Communication with Friends and Family: More than three times a week    Frequency of Social Gatherings with Friends and Family: Twice a week    Attends Religious Services: Not on Marketing executive or Organizations: No    Attends Banker Meetings: Never    Marital Status: Separated  Intimate Partner Violence: Not At Risk (06/28/2023)   Humiliation, Afraid, Rape, and Kick questionnaire    Fear  of Current or Ex-Partner: No    Emotionally Abused: No    Physically Abused: No    Sexually Abused: No     Family History  Problem Relation Age of Onset   Heart attack Mother    Hypertension Mother        pt reports mother has stent   Other Father        unknown medical history   Healthy Sister    Other Maternal Grandmother        unknown medical history   Other Maternal Grandfather 10       choked on a watermelon seed  Other Paternal Grandmother        unknown medical history   Other Paternal Grandfather        unknown medical history    ROS: Otherwise negative unless mentioned in HPI  Physical Examination  Vitals:   07/01/23 2319 07/02/23 0838  BP: (!) 129/58 139/67  Pulse: 64 65  Resp: 20 14  Temp: 98.6 F (37 C) 97.6 F (36.4 C)  SpO2: 98% 100%   Body mass index is 30.37 kg/m.  General:  WDWN in NAD Gait: Not observed HENT: WNL, normocephalic Pulmonary: normal non-labored breathing, without Rales, rhonchi,  wheezing Cardiac: regular, without  Murmurs, rubs or gallops; without carotid bruits Abdomen: Positive bowel sounds, soft, NT/ND, no masses Skin: without rashes Vascular Exam/Pulses: palpable pulses throughout Extremities: without ischemic changes, without Gangrene , without cellulitis; without open wounds;  Musculoskeletal: no muscle wasting or atrophy  Neurologic: A&O X 3;  No focal weakness or paresthesias are detected; speech is fluent/normal Psychiatric:  The pt has Normal affect. Lymph:  Unremarkable  CBC    Component Value Date/Time   WBC 12.1 (H) 07/01/2023 0634   RBC 2.57 (L) 07/01/2023 0634   HGB 8.2 (L) 07/01/2023 0634   HGB 11.8 (L) 02/14/2023 1044   HCT 23.8 (L) 07/01/2023 0634   HCT 34.1 (L) 02/14/2023 1044   PLT 179 07/01/2023 0634   PLT 244 02/22/2022 1315   MCV 92.6 07/01/2023 0634   MCV 97 02/14/2023 1044   MCH 31.9 07/01/2023 0634   MCHC 34.5 07/01/2023 0634   RDW 12.6 07/01/2023 0634   RDW 11.5 (L) 02/14/2023 1044    LYMPHSABS 2.6 02/14/2023 1044   MONOABS 0.8 12/29/2020 1143   EOSABS 0.1 02/14/2023 1044   BASOSABS 0.0 02/14/2023 1044    BMET    Component Value Date/Time   NA 133 (L) 07/02/2023 0258   NA 142 06/13/2023 1750   K 4.2 07/02/2023 0258   CL 108 07/02/2023 0258   CO2 13 (L) 07/02/2023 0258   GLUCOSE 94 07/02/2023 0258   BUN 81 (H) 07/02/2023 0258   BUN 21 06/13/2023 1750   CREATININE 9.86 (H) 07/02/2023 0258   CALCIUM 8.0 (L) 07/02/2023 0258   GFRNONAA 6 (L) 07/02/2023 0258   GFRAA 78 03/12/2019 1705    COAGS: Lab Results  Component Value Date   INR 1.2 06/28/2023   INR 1.1 07/31/2022   INR 0.8 (A) 02/26/2018     Non-Invasive Vascular Imaging:   None   Statin:  Yes.   Beta Blocker:  Yes.   Aspirin:  Yes.   ACEI:  No. ARB:  Yes.   CCB use:  Yes Other antiplatelets/anticoagulants:  Yes.   Plavix 75 mg Daily   ASSESSMENT/PLAN: This is a 52 y.o. male with history of alcohol abuse, hypertension, hyperlipidemia, history of TIA, GERD, erectile dysfunction, tobacco use, history of seizure, who presents to the emergency department for chief concerns of shortness of breath and dizziness.  Upon workup patient was found to be in acute kidney failure.  Patient's BUN and creatinine are elevated today at 81 and 9.86.  Vascular surgery was consulted for hemodialysis access.  Vascular surgery plans on taking the patient to the vascular lab today for dialysis access perma catheter placement.  I discussed in detail with the patient the procedure, benefits, risks, and complications.  He verbalizes understanding.  He wishes to proceed as soon as possible.  I answered all the patient's questions this morning.  Patient has been n.p.o. since  midnight last night.   -I discussed the plan in detail with Dr. Festus Barren MD and he agrees with the plan.   Marcie Bal [facility] 07/02/2023 9:34 AM

## 2023-07-02 NOTE — H&P (View-Only) (Signed)
Hospital Consult    Reason for Consult:  Hemodialysis Access Requesting Physician:  Malachi Carl NP MRN #:  [id number]  History of Present Illness: This is a 52 y.o. male with history of alcohol abuse, hypertension, hyperlipidemia, history of TIA, GERD, erectile dysfunction, tobacco use, history of seizure, who presents to the emergency department for chief concerns of shortness of breath and dizziness. Patient was found to have an elevated BUN and Creatinine. Today his BUN is 81 and creatinine of 9.86. Vascular Surgery consulted for dialysis Perma Catheter placement for hemodialysis.    Past Medical History:  Diagnosis Date   Alcohol use    Carotid artery occlusion    GERD (gastroesophageal reflux disease)    Hypercholesteremia    Hypertension    Seizures (HCC)    Stroke (HCC)    TIA 12/29/20, re-admitted 01/14/21 for sequelae   Tobacco abuse     Past Surgical History:  Procedure Laterality Date   NO PAST SURGERIES      No Known Allergies  Prior to Admission medications   Medication Sig Start Date End Date Taking? Authorizing Provider  amLODipine (NORVASC) 10 MG tablet Take 1 tablet (10 mg total) by mouth every evening. 06/20/23  Yes Iloabachie, Chioma E, NP  aspirin 81 MG tablet Take 1 tablet (81 mg total) by mouth daily. 11/01/17  Yes Kallie Locks, FNP  carvedilol (COREG) 25 MG tablet Take 1 tablet (25 mg total) by mouth 2 (two) times daily with a meal. 06/20/23  Yes Iloabachie, Chioma E, NP  clopidogrel (PLAVIX) 75 MG tablet Take 1 tablet (75 mg total) by mouth daily. 06/20/23 06/19/24 Yes Iloabachie, Chioma E, NP  folic acid (FOLVITE) 1 MG tablet Take 1 tablet (1 mg total) by mouth daily. 12/20/22  Yes Iloabachie, Chioma E, NP  ibuprofen (ADVIL) 200 MG tablet Take 800 mg by mouth daily as needed for headache or moderate pain (usually takes daily for headaches, leg pain in am).   Yes [provider]  levETIRAcetam (KEPPRA) 1000 MG tablet Take 1 tablet (1,000 mg  total) by mouth 2 (two) times daily. 06/20/23  Yes Iloabachie, Chioma E, NP  losartan (COZAAR) 100 MG tablet Take 1 tablet (100 mg total) by mouth in the morning. 03/07/23  Yes Iloabachie, Chioma E, NP  magnesium oxide (MAG-OX) 400 MG tablet Take 1 tablet (400 mg total) by mouth daily. 07/12/22  Yes Tukov-Yual, Magdalene S, NP  pantoprazole (PROTONIX) 40 MG tablet Take 1 tablet (40 mg total) by mouth daily. 06/20/23  Yes Iloabachie, Chioma E, NP  rosuvastatin (CRESTOR) 40 MG tablet Take 1 tablet (40 mg total) by mouth daily. 04/25/23 07/28/23 Yes Agbor-Etang, Arlys John, MD  sildenafil (VIAGRA) 100 MG tablet Take 1 tablet 1 hour prior to intercourse. 07/02/22  Yes Stoioff, Verna Czech, MD  thiamine (VITAMIN B-1) 100 MG tablet Take 1 tablet (100 mg total) by mouth daily. 12/20/22  Yes Iloabachie, Chioma E, NP  blood glucose meter kit and supplies KIT Use as directed to check blood glucose 2-3 times per week and as needed 02/22/22   Tukov-Yual, Alroy Bailiff, NP  glucose blood (RIGHTEST GS550 BLOOD GLUCOSE) test strip Use as directed to check blood glucose 2-3 times per week and as needed 02/22/22   Andreas Ohm, NP  Rightest GL300 Lancets MISC Use as directed to check blood glucose 2-3 times per week and as needed 02/22/22   Tukov-Yual, Alroy Bailiff, NP  vitamin B-12 (CYANOCOBALAMIN) 500 MCG tablet Take 2 tablets (1,000 mcg total)  by mouth daily. Patient not taking: Reported on 06/28/2023 01/29/23   Rolm Gala, NP    Social History   Socioeconomic History   Marital status: Divorced    Spouse name: Not on file   Number of children: 4   Years of education: Not on file   Highest education level: High school graduate  Occupational History   Not on file  Tobacco Use   Smoking status: Every Day    Current packs/day: 0.25    Average packs/day: 0.3 packs/day for 34.0 years (8.5 ttl pk-yrs)    Types: Cigarettes   Smokeless tobacco: Never   Tobacco comments:    Pt reports he has thoughts of  quitting, has cut back to 5 cigarettes per day  Vaping Use   Vaping status: Never Used  Substance and Sexual Activity   Alcohol use: Yes    Alcohol/week: 21.0 standard drinks of alcohol    Types: 21 Standard drinks or equivalent per week    Comment: 3-4 mixed drinks daily, 03/07/23 patient states down to 2.5 drinks per day   Drug use: No   Sexual activity: Yes    Birth control/protection: None  Other Topics Concern   Not on file  Social History Narrative   Lives at home with his aunt   Right handed   Drinks no caffeine   Social Determinants of Health   Financial Resource Strain: Low Risk  (06/20/2023)   Overall Financial Resource Strain (CARDIA)    Difficulty of Paying Living Expenses: Not hard at all  Food Insecurity: No Food Insecurity (06/28/2023)   Hunger Vital Sign    Worried About Running Out of Food in the Last Year: Never true    Ran Out of Food in the Last Year: Never true  Transportation Needs: No Transportation Needs (06/28/2023)   PRAPARE - Administrator, Civil Service (Medical): No    Lack of Transportation (Non-Medical): No  Physical Activity: Sufficiently Active (06/20/2023)   Exercise Vital Sign    Days of Exercise per Week: 5 days    Minutes of Exercise per Session: 60 min  Stress: No Stress Concern Present (06/20/2023)   [employer] of Occupational Health - Occupational Stress Questionnaire    Feeling of Stress : Not at all  Social Connections: Unknown (06/20/2023)   Social Connection and Isolation Panel [NHANES]    Frequency of Communication with Friends and Family: More than three times a week    Frequency of Social Gatherings with Friends and Family: Twice a week    Attends Religious Services: Not on Marketing executive or Organizations: No    Attends Banker Meetings: Never    Marital Status: Separated  Intimate Partner Violence: Not At Risk (06/28/2023)   Humiliation, Afraid, Rape, and Kick questionnaire    Fear  of Current or Ex-Partner: No    Emotionally Abused: No    Physically Abused: No    Sexually Abused: No     Family History  Problem Relation Age of Onset   Heart attack Mother    Hypertension Mother        pt reports mother has stent   Other Father        unknown medical history   Healthy Sister    Other Maternal Grandmother        unknown medical history   Other Maternal Grandfather 10       choked on a watermelon seed  Other Paternal Grandmother        unknown medical history   Other Paternal Grandfather        unknown medical history    ROS: Otherwise negative unless mentioned in HPI  Physical Examination  Vitals:   07/01/23 2319 07/02/23 0838  BP: (!) 129/58 139/67  Pulse: 64 65  Resp: 20 14  Temp: 98.6 F (37 C) 97.6 F (36.4 C)  SpO2: 98% 100%   Body mass index is 30.37 kg/m.  General:  WDWN in NAD Gait: Not observed HENT: WNL, normocephalic Pulmonary: normal non-labored breathing, without Rales, rhonchi,  wheezing Cardiac: regular, without  Murmurs, rubs or gallops; without carotid bruits Abdomen: Positive bowel sounds, soft, NT/ND, no masses Skin: without rashes Vascular Exam/Pulses: palpable pulses throughout Extremities: without ischemic changes, without Gangrene , without cellulitis; without open wounds;  Musculoskeletal: no muscle wasting or atrophy  Neurologic: A&O X 3;  No focal weakness or paresthesias are detected; speech is fluent/normal Psychiatric:  The pt has Normal affect. Lymph:  Unremarkable  CBC    Component Value Date/Time   WBC 12.1 (H) 07/01/2023 0634   RBC 2.57 (L) 07/01/2023 0634   HGB 8.2 (L) 07/01/2023 0634   HGB 11.8 (L) 02/14/2023 1044   HCT 23.8 (L) 07/01/2023 0634   HCT 34.1 (L) 02/14/2023 1044   PLT 179 07/01/2023 0634   PLT 244 02/22/2022 1315   MCV 92.6 07/01/2023 0634   MCV 97 02/14/2023 1044   MCH 31.9 07/01/2023 0634   MCHC 34.5 07/01/2023 0634   RDW 12.6 07/01/2023 0634   RDW 11.5 (L) 02/14/2023 1044    LYMPHSABS 2.6 02/14/2023 1044   MONOABS 0.8 12/29/2020 1143   EOSABS 0.1 02/14/2023 1044   BASOSABS 0.0 02/14/2023 1044    BMET    Component Value Date/Time   NA 133 (L) 07/02/2023 0258   NA 142 06/13/2023 1750   K 4.2 07/02/2023 0258   CL 108 07/02/2023 0258   CO2 13 (L) 07/02/2023 0258   GLUCOSE 94 07/02/2023 0258   BUN 81 (H) 07/02/2023 0258   BUN 21 06/13/2023 1750   CREATININE 9.86 (H) 07/02/2023 0258   CALCIUM 8.0 (L) 07/02/2023 0258   GFRNONAA 6 (L) 07/02/2023 0258   GFRAA 78 03/12/2019 1705    COAGS: Lab Results  Component Value Date   INR 1.2 06/28/2023   INR 1.1 07/31/2022   INR 0.8 (A) 02/26/2018     Non-Invasive Vascular Imaging:   None   Statin:  Yes.   Beta Blocker:  Yes.   Aspirin:  Yes.   ACEI:  No. ARB:  Yes.   CCB use:  Yes Other antiplatelets/anticoagulants:  Yes.   Plavix 75 mg Daily   ASSESSMENT/PLAN: This is a 52 y.o. male with history of alcohol abuse, hypertension, hyperlipidemia, history of TIA, GERD, erectile dysfunction, tobacco use, history of seizure, who presents to the emergency department for chief concerns of shortness of breath and dizziness.  Upon workup patient was found to be in acute kidney failure.  Patient's BUN and creatinine are elevated today at 81 and 9.86.  Vascular surgery was consulted for hemodialysis access.  Vascular surgery plans on taking the patient to the vascular lab today for dialysis access perma catheter placement.  I discussed in detail with the patient the procedure, benefits, risks, and complications.  He verbalizes understanding.  He wishes to proceed as soon as possible.  I answered all the patient's questions this morning.  Patient has been n.p.o. since  midnight last night.   -I discussed the plan in detail with Dr. Festus Barren MD and he agrees with the plan.   Marcie Bal [facility] 07/02/2023 9:34 AM

## 2023-07-02 NOTE — Op Note (Signed)
OPERATIVE NOTE    PRE-OPERATIVE DIAGNOSIS: 1. ESRD   POST-OPERATIVE DIAGNOSIS: same as above  PROCEDURE: Ultrasound guidance for vascular access to the right internal jugular vein Fluoroscopic guidance for placement of catheter Placement of a 19 cm tip to cuff tunneled hemodialysis catheter via the right internal jugular vein  SURGEON: Festus Barren, MD  ANESTHESIA:  Local with Moderate conscious sedation for approximately 18 minutes using 2 mg of Versed and 50 mcg of Fentanyl  ESTIMATED BLOOD LOSS: 5 cc  FLUORO TIME: less than one minute  CONTRAST: none  FINDING(S): 1.  Patent right internal jugular vein  SPECIMEN(S):  None  INDICATIONS:   Gary [name] is a 52 y.o.male who presents with renal failure.  The patient needs long term dialysis access for their ESRD, and a Permcath is necessary.  Risks and benefits are discussed and informed consent is obtained.    DESCRIPTION: After obtaining full informed written consent, the patient was brought back to the vascular suited. The patient's right neck and chest were sterilely prepped and draped in a sterile surgical field was created. Moderate conscious sedation was administered during a face to face encounter with the patient throughout the procedure with my supervision of the RN administering medicines and monitoring the patient's vital signs, pulse oximetry, telemetry and mental status throughout from the start of the procedure until the patient was taken to the recovery room.  The right internal jugular vein was visualized with ultrasound and found to be patent. It was then accessed under direct ultrasound guidance and a permanent image was recorded. A wire was placed. After skin nick and dilatation, the peel-away sheath was placed over the wire. I then turned my attention to an area under the clavicle. Approximately 1-2 fingerbreadths below the clavicle a small counterincision was created and tunneled from the subclavicular incision to  the access site. Using fluoroscopic guidance, a 19 centimeter tip to cuff tunneled hemodialysis catheter was selected, and tunneled from the subclavicular incision to the access site. It was then placed through the peel-away sheath and the peel-away sheath was removed. Using fluoroscopic guidance the catheter tips were parked in the right atrium. The appropriate distal connectors were placed. It withdrew blood well and flushed easily with heparinized saline and a concentrated heparin solution was then placed. It was secured to the chest wall with 2 Prolene sutures. The access incision was closed single 4-0 Monocryl. A 4-0 Monocryl pursestring suture was placed around the exit site. Sterile dressings were placed. The patient tolerated the procedure well and was taken to the recovery room in stable condition.  COMPLICATIONS: None  CONDITION: Stable  Festus Barren, MD 07/02/2023 3:33 PM   This note was created with Dragon Medical transcription system. Any errors in dictation are purely unintentional.

## 2023-07-02 NOTE — Progress Notes (Signed)
[facility]  ROUNDING NOTE   Subjective:   Patient seen laying in bed Discomfort has improved in scrotum Decreased appetite  Creatinine 9.86 Urine output 700 ml  Objective:  Vital signs in last 24 hours:  Temp:  [97.6 F (36.4 C)-98.6 F (37 C)] 97.6 F (36.4 C) (09/24 0838) Pulse Rate:  [63-65] 65 (09/24 0838) Resp:  [14-20] 14 (09/24 0838) BP: (124-139)/(58-80) 139/67 (09/24 0838) SpO2:  [98 %-100 %] 100 % (09/24 0838)  Weight change:  Filed Weights   06/28/23 1121  Weight: 96 kg    Intake/Output: I/O last 3 completed shifts: In: 1353.6 [P.O.:120; I.V.:1233.6] Out: 700 [Urine:700]   Intake/Output this shift:  Total I/O In: -  Out: 200 [Urine:200]  Physical Exam: General: NAD  Head: Normocephalic, atraumatic. Moist oral mucosal membranes  Eyes: Anicteric  Lungs:  Clear to auscultation normal effort  Heart: Regular rate and rhythm  Abdomen:  Soft, nontender  Extremities: trace peripheral edema.  Neurologic: Alert, moving all four extremities  Skin: No lesions.  Scrotal edema and tenderness  Access: None    Basic Metabolic Panel: Recent Labs  Lab 06/28/23 1123 06/28/23 1232 06/29/23 0613 06/30/23 0626 07/01/23 0634 07/02/23 0258  NA 132*  --  132* 133* 136 133*  K 4.0  --  4.1 4.0 4.3 4.2  CL 98  --  104 107 111 108  CO2 20*  --  16* 15* 16* 13*  GLUCOSE 140*  --  101* 116* 100* 94  BUN 34*  --  46* 60* 72* 81*  CREATININE 4.18*  --  5.88* 8.06* 9.46* 9.86*  CALCIUM 9.1  --  7.8* 7.7* 8.1* 8.0*  MG  --  2.3  --   --   --   --     Liver Function Tests: Recent Labs  Lab 06/28/23 1123 06/29/23 0613 06/30/23 0626 07/01/23 0634 07/02/23 0258  AST 98* 70* 77* 51* 37  ALT 76* 58* 57* 49* 44  ALKPHOS 156* 139* 157* 169* 159*  BILITOT 1.9* 1.7* 1.3* 1.2 1.1  PROT 7.9 6.8 7.0 6.5 6.7  ALBUMIN 3.3* 2.7* 2.6* 2.5* 2.5*   No results for input(s): "LIPASE", "AMYLASE" in the last 168 hours. No results for input(s): "AMMONIA" in the  last 168 hours.  CBC: Recent Labs  Lab 06/28/23 1123 06/29/23 0613 07/01/23 0634  WBC 17.5* 16.6* 12.1*  HGB 9.1* 8.1* 8.2*  HCT 26.6* 23.5* 23.8*  MCV 94.3 93.6 92.6  PLT 166 144* 179    Cardiac Enzymes: Recent Labs  Lab 06/28/23 1232 06/29/23 0613  CKTOTAL 109 106    BNP: Invalid input(s): "POCBNP"  CBG: No results for input(s): "GLUCAP" in the last 168 hours.  Microbiology: Results for orders placed or performed during the hospital encounter of 06/28/23  Blood culture (single)     Status: None (Preliminary result)   Collection Time: 06/28/23 12:22 PM   Specimen: BLOOD  Result Value Ref Range Status   Specimen Description BLOOD LEFT ANTECUBITAL  Final   Special Requests   Final    BOTTLES DRAWN AEROBIC AND ANAEROBIC Blood Culture adequate volume   Culture   Final    NO GROWTH 4 DAYS Performed at [facility], [address], [place], [place] [address]    Report Status PENDING  Incomplete  SARS Coronavirus 2 by RT PCR (hospital order, performed in [facility] hospital lab) *cepheid single result test* Anterior Nasal Swab     Status: None   Collection Time:  06/29/23 12:15 AM   Specimen: Anterior Nasal Swab  Result Value Ref Range Status   SARS Coronavirus 2 by RT PCR NEGATIVE NEGATIVE Final    Comment: (NOTE) SARS-CoV-2 target nucleic acids are NOT DETECTED.  The SARS-CoV-2 RNA is generally detectable in upper and lower respiratory specimens during the acute phase of infection. The lowest concentration of SARS-CoV-2 viral copies this assay can detect is 250 copies / mL. A negative result does not preclude SARS-CoV-2 infection and should not be used as the sole basis for treatment or other patient management decisions.  A negative result may occur with improper specimen collection / handling, submission of specimen other than nasopharyngeal swab, presence of viral mutation(s) within the areas targeted by this assay, and inadequate number of viral  copies (<250 copies / mL). A negative result must be combined with clinical observations, patient history, and epidemiological information.  Fact Sheet for Patients:   RoadLapTop.co.za  Fact Sheet for Healthcare Providers: http://kim-miller.com/  This test is not yet approved or  cleared by the [place] FDA and has been authorized for detection and/or diagnosis of SARS-CoV-2 by FDA under an Emergency Use Authorization (EUA).  This EUA will remain in effect (meaning this test can be used) for the duration of the COVID-19 declaration under Section 564(b)(1) of the Act, 21 U.S.C. section 360bbb-3(b)(1), unless the authorization is terminated or revoked sooner.  Performed at [facility], [address] [place], [place] [address]   Chlamydia/NGC rt PCR [facility] only)     Status: None   Collection Time: 06/29/23 10:42 AM   Specimen: Urine; GU  Result Value Ref Range Status   Specimen source GC/Chlam URINE, RANDOM  Final   Chlamydia Tr NOT DETECTED NOT DETECTED Final   N gonorrhoeae NOT DETECTED NOT DETECTED Final    Comment: (NOTE) This CT/NG assay has not been evaluated in patients with a history of  hysterectomy. Performed at [facility], [address] [place], [place] [address]     Coagulation Studies: No results for input(s): "LABPROT", "INR" in the last 72 hours.   Urinalysis: No results for input(s): "COLORURINE", "LABSPEC", "PHURINE", "GLUCOSEU", "HGBUR", "BILIRUBINUR", "KETONESUR", "PROTEINUR", "UROBILINOGEN", "NITRITE", "LEUKOCYTESUR" in the last 72 hours.  Invalid input(s): "APPERANCEUR"     Imaging: [place] RT LOWER EXTREM LTD SOFT TISSUE NON VASCULAR  Result Date: 07/01/2023 CLINICAL DATA:  Right inguinal abscess EXAM: ULTRASOUND right LOWER EXTREMITY LIMITED TECHNIQUE: Ultrasound examination of the lower extremity soft tissues was performed in the area of clinical concern. COMPARISON:  Scrotal  ultrasound 06/29/2023 FINDINGS: Targeted sonography in the region of concern described as lateral inferior scrotum is performed. Edematous soft tissues with probable areas of air within. Heterogenous tissue deep to the skin wound contains vascularity and presumably represents inflammatory tissue. No focal fluid collection is seen. IMPRESSION: Heterogenous tissue deep to the skin wound contains vascularity and presumably represents inflammatory tissue. No focal fluid collection is seen. Electronically Signed   By: Jasmine Pang M.D.   On: 07/01/2023 18:33     Medications:    sodium chloride 125 mL/hr at 07/02/23 0651    amLODipine  10 mg Oral QPM   aspirin EC  81 mg Oral Daily   carvedilol  25 mg Oral BID WC   clopidogrel  75 mg Oral Daily   doxycycline  100 mg Oral Q12H   folic acid  1 mg Oral Daily   heparin  5,000 Units Subcutaneous Q8H   lactulose  20 g Oral Once  levETIRAcetam  500 mg Oral BID   nystatin   Topical BID   pantoprazole  40 mg Oral Daily   thiamine  100 mg Oral Daily   acetaminophen **OR** acetaminophen, guaiFENesin-dextromethorphan, LORazepam, melatonin, nicotine, ondansetron **OR** ondansetron (ZOFRAN) IV, senna-docusate  Assessment/ Plan:  [name] is a 52 y.o.  male with medical problems of hypertension, alcohol abuse, hyperlipidemia, history of TIA, GERD, erectile dysfunction, tobacco abuse, history of seizures  was admitted on 06/28/2023 for Shortness of breath [R06.02] SOB (shortness of breath) [R06.02] AKI (acute kidney injury) (HCC) [N17.9] Anemia, unspecified type [D64.9]   Acute kidney injury on chronic kidney disease stage IIIa. AKI secondary to severe ATN with chronic NSAID use Hematuria Proteinuria. Elevated liver enzymes Hypoalbuminemia Fatty liver Alcohol abuse Current smoker Scrotal Abscess  Lab Results  Component Value Date   CREATININE 9.86 (H) 07/02/2023   CREATININE 9.46 (H) 07/01/2023   CREATININE 8.06 (H) 06/30/2023     Intake/Output Summary (Last 24 hours) at 07/02/2023 1211 Last data filed at 07/02/2023 0749 Gross per 24 hour  Intake 1077.66 ml  Output 700 ml  Net 377.66 ml   Baseline creatinine of 1.61, GFR 51 from 06/13/2023.  Presenting creatinine of 4.18, GFR 16.  In addition patient has elevated liver enzymes suggesting hepatitis possibly alcoholic hepatitis.  Albumin is low.  Diffuse echogenic liver consistent with fatty infiltration. Urinalysis with moderate blood, 0-5 RBCs, 0-5 WBCs, 100 mg of protein. Renal ultrasound is negative for obstruction, mass.   Differential diagnosis includes nonsteroidals induced AKI, FSGS, glomerulonephritis, infection   Protein creatinine ratio 2.23, CK 106, UDS negative   Plan: Creatinine 9.86, BUN 81.  Urology consult -low suspicion of epididymitis, continue oral antibiotics.  Scrotal ultrasound negative for fluid collection Continue doxycycline and IV fluids Patient reports a BM prior to receiving lactulose. Due to continued decline in renal function, we feel it is necessary to initiate renal replacement therapy.  Patient agrees.  Vascular consulted for placement of tunneled catheter.  Patient will receive first dialysis treatment tomorrow.    LOS: 4 Gary [name] 9/24/202412:11 PM

## 2023-07-03 ENCOUNTER — Inpatient Hospital Stay: Payer: Medicaid Other

## 2023-07-03 ENCOUNTER — Encounter: Payer: Self-pay | Admitting: Vascular Surgery

## 2023-07-03 LAB — BASIC METABOLIC PANEL
Anion gap: 10 (ref 5–15)
BUN: 86 mg/dL — ABNORMAL HIGH (ref 6–20)
CO2: 13 mmol/L — ABNORMAL LOW (ref 22–32)
Calcium: 8 mg/dL — ABNORMAL LOW (ref 8.9–10.3)
Chloride: 114 mmol/L — ABNORMAL HIGH (ref 98–111)
Creatinine, Ser: 10.14 mg/dL — ABNORMAL HIGH (ref 0.61–1.24)
GFR, Estimated: 6 mL/min — ABNORMAL LOW (ref >60)
Glucose, Bld: 96 mg/dL (ref 70–99)
Potassium: 4.5 mmol/L (ref 3.5–5.1)
Sodium: 137 mmol/L (ref 135–145)

## 2023-07-03 LAB — CBC
HCT: 22.8 % — ABNORMAL LOW (ref 39.0–52.0)
Hemoglobin: 8 g/dL — ABNORMAL LOW (ref 13.0–17.0)
MCH: 32.4 pg (ref 26.0–34.0)
MCHC: 35.1 g/dL (ref 30.0–36.0)
MCV: 92.3 fL (ref 80.0–100.0)
Platelets: 223 10*3/uL (ref 150–400)
RBC: 2.47 MIL/uL — ABNORMAL LOW (ref 4.22–5.81)
RDW: 12.9 % (ref 11.5–15.5)
WBC: 12.6 10*3/uL — ABNORMAL HIGH (ref 4.0–10.5)
nRBC: 0 % (ref 0.0–0.2)

## 2023-07-03 LAB — CULTURE, BLOOD (SINGLE)
Culture: NO GROWTH
Special Requests: ADEQUATE

## 2023-07-03 LAB — ANCA PROFILE
Anti-MPO Antibodies: <0.2 units (ref 0.0–0.9)
Anti-PR3 Antibodies: <0.2 units (ref 0.0–0.9)
Atypical P-ANCA titer: <1:20 {titer}
C-ANCA: <1:20 {titer}
P-ANCA: <1:20 {titer}

## 2023-07-03 LAB — IRON AND TIBC
Iron: 96 ug/dL (ref 45–182)
Saturation Ratios: 45 % — ABNORMAL HIGH (ref 17.9–39.5)
TIBC: 214 ug/dL — ABNORMAL LOW (ref 250–450)
UIBC: 118 ug/dL

## 2023-07-03 LAB — FOLATE: Folate: 16.2 ng/mL (ref >5.9)

## 2023-07-03 MED ORDER — SODIUM BICARBONATE 650 MG PO TABS
650.0000 mg | ORAL_TABLET | Freq: Three times a day (TID) | ORAL | Status: DC
  Administered 2023-07-03 – 2023-07-05 (×6): 650 mg via ORAL
  Filled 2023-07-03 (×8): qty 1

## 2023-07-03 NOTE — Plan of Care (Signed)
  Problem: Coping: Goal: Will verbalize positive feelings about self Outcome: Progressing Goal: Will identify appropriate support needs Outcome: Progressing   Problem: Health Behavior/Discharge Planning: Goal: Ability to manage health-related needs will improve Outcome: Progressing   Problem: Self-Care: Goal: Ability to participate in self-care as condition permits will improve Outcome: Progressing   Problem: Nutrition: Goal: Risk of aspiration will decrease Outcome: Progressing Goal: Dietary intake will improve Outcome: Progressing   Problem: Health Behavior/Discharge Planning: Goal: Ability to manage health-related needs will improve Outcome: Progressing   Problem: Coping: Goal: Level of anxiety will decrease Outcome: Progressing   Problem: Elimination: Goal: Will not experience complications related to bowel motility Outcome: Progressing

## 2023-07-03 NOTE — Progress Notes (Signed)
Received patient in bed to unit.    Informed consent signed and in chart.    TX duration: 2 hrs     Transported back to floor  Hand-off given to patient's nurse.   Access used:  rt chest CVC Access issues: n/a  Total UF removed: 0 Medication(s) given: n/a      Maple Hudson, RN Dialysis Unit

## 2023-07-03 NOTE — Progress Notes (Signed)
  Progress Note    07/03/2023 1:27 PM 1 Day Post-Op  Subjective:  Gary [name] is a 52 yo male now POD #1 from dialysis Perma Catheter placement. Dressing clean dry and intact. Patient endorses soreness to the site but no sharp pain. No complaints over night. Vitals all remain stable    Vitals:   07/03/23 1033 07/03/23 1041  BP: (!) 156/86   Pulse: 63 67  Resp: 18 19  Temp: 98.1 F (36.7 C)   SpO2: 100% 100%   Physical Exam: Cardiac:  RRR, Normal S1, S2.  Lungs:  Clear on auscultation throughout. No rales, rhonchi or wheezing to note.  Incisions:  Right chest with dressing clean dry and intact.  Extremities:  Palpable pulses throughout. No edema Abdomen:  Positive bowel sounds throughout, soft non tender Neurologic: AAOX4 and follows commands appropriately.   CBC    Component Value Date/Time   WBC 12.6 (H) 07/03/2023 0719   RBC 2.47 (L) 07/03/2023 0719   HGB 8.0 (L) 07/03/2023 0719   HGB 11.8 (L) 02/14/2023 1044   HCT 22.8 (L) 07/03/2023 0719   HCT 34.1 (L) 02/14/2023 1044   PLT 223 07/03/2023 0719   PLT 244 02/22/2022 1315   MCV 92.3 07/03/2023 0719   MCV 97 02/14/2023 1044   MCH 32.4 07/03/2023 0719   MCHC 35.1 07/03/2023 0719   RDW 12.9 07/03/2023 0719   RDW 11.5 (L) 02/14/2023 1044   LYMPHSABS 2.6 02/14/2023 1044   MONOABS 0.8 12/29/2020 1143   EOSABS 0.1 02/14/2023 1044   BASOSABS 0.0 02/14/2023 1044    BMET    Component Value Date/Time   NA 137 07/03/2023 0719   NA 142 06/13/2023 1750   K 4.5 07/03/2023 0719   CL 114 (H) 07/03/2023 0719   CO2 13 (L) 07/03/2023 0719   GLUCOSE 96 07/03/2023 0719   BUN 86 (H) 07/03/2023 0719   BUN 21 06/13/2023 1750   CREATININE 10.14 (H) 07/03/2023 0719   CALCIUM 8.0 (L) 07/03/2023 0719   GFRNONAA 6 (L) 07/03/2023 0719   GFRAA 78 03/12/2019 1705    INR    Component Value Date/Time   INR 1.2 06/28/2023 1232     Intake/Output Summary (Last 24 hours) at 07/03/2023 1327 Last data filed at 07/03/2023  1041 Gross per 24 hour  Intake 2668.08 ml  Output 750 ml  Net 1918.08 ml     Assessment/Plan:  52 y.o. male is s/p dialysis Perma Catheter placement.  1 Day Post-Op   PLAN: Patient recovering as expected. Dressing clean dry and intact. Okay to use for dialysis.  Vascular Surgery to sign off at this time. Please reconsult if needed.   DVT prophylaxis:  Heparin with dialysis   Marcie Bal [facility] 07/03/2023 1:27 PM

## 2023-07-03 NOTE — Progress Notes (Signed)
[facility] Progress Note  Patient: Gary [name]    BJY:[id number]  DOA: 06/28/2023     Date of Service: the patient was seen and examined on 07/03/2023  Chief Complaint  Patient presents with   Shortness of Breath   Brief hospital course: [name] is a 52 year old male with history of alcohol abuse, hypertension, hyperlipidemia, history of TIA, GERD, erectile dysfunction, tobacco use, history of seizure, who presents to the emergency department for chief concerns of shortness of breath and dizziness. 09/20: in ED, VSS, mild hyponatremia sodium 132, significantly elevated creatinine 4.18 w/ GFR 16, elevated LFT  AST 98 and ALT 76, Bili 1.9, ALP 156, BNP WNL, Troponin and CK WNL, WBC elevated 17.5, Hgb 9.1. CXR nonacute. Renal US normal no dilatation, Abd [place] (+)fatty liver, GB w/ diffuse wall thickening and focal nodular area measuring up to 19 by 7 x 5 mm concern for underlying lesion and advise compare w/ MRI. Given Protonix 80 mg IV one-time dose, sodium chloride 1 L bolus. Admitted to hospitalist service.  09/21: SOB improved, renal function worse Cr to 5.88, consulted to nephrology. Pt c/o scrotal pain, [place] (+)small bl hydrocele L varicocele L epididymal cyst - started tx for epididymitis given leukocytosis  09/22: early AM, scrotum apparently opened to drain - pt describes clear thin liquid, bandage appears serous fluid, no purulent, no erythema to skin - monitor. Cr worse, up to 8, nephrology following, continuing IV fluids.  09/23: Cr still worsening and may need to consider dialysis tomorrow, urology consult repeating US - no abscess 09/24: permcath placement and dialysis after that   Consultants:  Nephrology  Vascular surgery Urology    Procedures: none     Assessment and Plan: AKI (acute kidney injury) on CKD3a - worsening --> acute renal faillure  suspect multifactorial including prerenal in setting of poor p.o./fluid intake, NSAID use,  glomerulonephritis,  rhabdomyolysis, infection related AKI from epididymitis.  No concerns on renal US  UDS neg CK WNL Monitor BMP  Strict I&O Nephrology following  --> recs for HD, permcath and HD was placed on 07/02/23 Patient received first hemodialysis on 9/25 Pending ANA, ANCA, C3, C4, kappa/lambda, PTH, protein electrophoresis     Shortness of breath without respiratory failure - resolved  Seems likely to be respiratory compensation for metabolic acidosis d/t renal failure  Differentials would include COPD history of tobacco use  CXR personally reviewed, no concerns  Neg troponin, ACS unlikely     Gallbladder mass - nodular 19 mm x 7 mm x 5 mm on RUQ [place] Associated w/ elevation in Bili, AST, ALT, ALP Risk of gallbladder carcinoma given the size read  before surgical intervention is considered, need further imaging  MRCP with and without contrast has been deferred on admission due to acute kidney injury, but plan for this if/when pt can tolerate contrast 9/25 d/w general surgery, recommended MRI without contrast.  Dr. Tonna Boehringer will follow-up after imaging   Elevated liver enzymes Suspect secondary to gallbladder disease as above, complicated by alcohol abuse  Monitor CMP, LFT Hepatitis panel neg    History Seizures Calculated creatinine clearance is 13 mL/min on admission Patient on Keppra 1000 mg p.o. twice daily at home. Placed on Keppra reduced dosing to 500 mg p.o. twice daily in setting of acute kidney injury   Scrotal pain w/  Small bilateral hydrocele Left-sided varicocele 8 mm left epididymal cyst. Leukocytosis Ceftriaxone x1 + doxycycline 100 mg bid x10 days (FQ would cover enteric pathogens but pt  not sexually active for some time, FQ renal dosing problematic, will give doxy) started 09/21 Check for GC/CT in urine --> neg  Frequent dressing changes Urology following   Hyponatremia - stable, mild Likely d/t renal failure  Follow BMP   Metabolic acidosis Mild, secondary to acute  kidney injury Sodium bicarbonate 325 mg p.o. twice daily, 2 doses ordered 9/25-sodium bicarbonate 650 mg p.o. 3 times daily order placed  History CVA Statin held for now given elevation LFT  Plavix    HLD  04/25/2023, cardiologist discontinued Pravachol and started patient on Crestor 40 mg w/ improvement in cholesterol and AST/ALT.  Hold statin for now w/ elevated LFT    Essential HTN On home amlodipine, carvedilol    GERD  PPI   Insomnia Melatonin 5 mg nightly as needed for sleep   Tobacco abuse As needed nicotine patch ordered   Body mass index is 31.47 kg/m.  Interventions:  Diet: Renal diet DVT Prophylaxis: Subcutaneous Heparin    Advance goals of care discussion: Full code  Family Communication: family was not present at bedside, at the time of interview.  The pt provided permission to discuss medical plan with the family. Opportunity was given to ask question and all questions were answered satisfactorily.   Disposition:  Pt is from Home, admitted with AKI, developed ESRD, started new hemodialysis, scrotal infection on antibiotics, gallbladder lesion, pending MRI and general surgery eval, which precludes a safe discharge. Discharge to Home, when stable, may need 1-2 more days to stay in the hospital.  Subjective: No significant events overnight, patient was seen after hemodialysis, tolerated procedure well.  Scrotal infection is improving, denies any pain.  No chest pain or palpitation, no shortness of breath, no any other complaints.   Physical Exam: General: NAD, lying comfortably Appear in no distress, affect appropriate Eyes: PERRLA ENT: Oral Mucosa Clear, moist  Neck: no JVD,  Cardiovascular: S1 and S2 Present, no Murmur,  Respiratory: good respiratory effort, Bilateral Air entry equal and Decreased, no Crackles, no wheezes Abdomen: Bowel Sound present, Soft and no tenderness,  Skin: no rashes Extremities: no Pedal edema, no calf tenderness Neurologic:  without any new focal findings Gait not checked due to patient safety concerns  Vitals:   07/03/23 1000 07/03/23 1030 07/03/23 1033 07/03/23 1041  BP: (!) 154/87 (!) 161/81 (!) 156/86   Pulse: 69 64 63 67  Resp: 17 17 18 19   Temp:   98.1 F (36.7 C)   TempSrc:   Oral   SpO2: 100% 100% 100% 100%  Weight:   99.5 kg   Height:        Intake/Output Summary (Last 24 hours) at 07/03/2023 1452 Last data filed at 07/03/2023 1041 Gross per 24 hour  Intake 2668.08 ml  Output 750 ml  Net 1918.08 ml   Filed Weights   06/28/23 1121 07/03/23 0743 07/03/23 1033  Weight: 96 kg 99.5 kg 99.5 kg    Data Reviewed: I have personally reviewed and interpreted daily labs, tele strips, imagings as discussed above. I reviewed all nursing notes, pharmacy notes, vitals, pertinent old records I have discussed plan of care as described above with RN and patient/family.  CBC: Recent Labs  Lab 06/28/23 1123 06/29/23 0613 07/01/23 0634 07/03/23 0719  WBC 17.5* 16.6* 12.1* 12.6*  HGB 9.1* 8.1* 8.2* 8.0*  HCT 26.6* 23.5* 23.8* 22.8*  MCV 94.3 93.6 92.6 92.3  PLT 166 144* 179 223   Basic Metabolic Panel: Recent Labs  Lab 06/28/23 1232 06/29/23  5366 06/30/23 0626 07/01/23 0634 07/02/23 0258 07/03/23 0719  NA  --  132* 133* 136 133* 137  K  --  4.1 4.0 4.3 4.2 4.5  CL  --  104 107 111 108 114*  CO2  --  16* 15* 16* 13* 13*  GLUCOSE  --  101* 116* 100* 94 96  BUN  --  46* 60* 72* 81* 86*  CREATININE  --  5.88* 8.06* 9.46* 9.86* 10.14*  CALCIUM  --  7.8* 7.7* 8.1* 8.0* 8.0*  MG 2.3  --   --   --   --   --     Studies: PERIPHERAL VASCULAR CATHETERIZATION  Result Date: 07/02/2023 See surgical note for result.   Scheduled Meds:  amLODipine  10 mg Oral QPM   aspirin EC  81 mg Oral Daily   carvedilol  25 mg Oral BID WC   Chlorhexidine Gluconate Cloth  6 each Topical Q0600   clopidogrel  75 mg Oral Daily   doxycycline  100 mg Oral Q12H   folic acid  1 mg Oral Daily   heparin  5,000 Units  Subcutaneous Q8H   lactulose  20 g Oral Once   levETIRAcetam  500 mg Oral BID   nystatin   Topical BID   pantoprazole  40 mg Oral Daily   sodium bicarbonate  650 mg Oral TID   thiamine  100 mg Oral Daily   Continuous Infusions:  sodium chloride 125 mL/hr at 07/03/23 0202   PRN Meds: fentaNYL (SUBLIMAZE) injection, guaiFENesin-dextromethorphan, LORazepam, melatonin, nicotine, ondansetron (ZOFRAN) IV, senna-docusate  Time spent: 35 minutes  Author: Gillis Santa. MD [facility] 07/03/2023 2:52 PM  To reach On-call, see care teams to locate the attending and reach out to them via [email or web address]. If 7PM-7AM, please contact night-coverage If you still have difficulty reaching the attending provider, please page the [facility] (Director on Call) for [facility] on amion for assistance.

## 2023-07-03 NOTE — Progress Notes (Signed)
[facility]  ROUNDING NOTE   Subjective:   Patient seen and evaluated during dialysis   HEMODIALYSIS FLOWSHEET:  Blood Flow Rate (mL/min): 200 mL/min Arterial Pressure (mmHg): -121.41 mmHg Venous Pressure (mmHg): 181 mmHg TMP (mmHg): 17.17 mmHg Ultrafiltration Rate (mL/min): 489 mL/min Dialysate Flow Rate (mL/min): 199 ml/min  Tolerating initial treatment well.   Creatinine 10.14 Urine output 950 ml  Objective:  Vital signs in last 24 hours:  Temp:  [97.7 F (36.5 C)-98.2 F (36.8 C)] 97.8 F (36.6 C) (09/25 0743) Pulse Rate:  [0-100] 69 (09/25 1000) Resp:  [11-26] 17 (09/25 1000) BP: (119-167)/(72-91) 154/87 (09/25 1000) SpO2:  [98 %-100 %] 100 % (09/25 1000) Weight:  [99.5 kg] 99.5 kg (09/25 0743)  Weight change:  Filed Weights   06/28/23 1121 07/03/23 0743  Weight: 96 kg 99.5 kg    Intake/Output: I/O last 3 completed shifts: In: 2668.1 [I.V.:2618.1; IV Piggyback:50] Out: 1450 [Urine:1450]   Intake/Output this shift:  No intake/output data recorded.  Physical Exam: General: NAD  Head: Normocephalic, atraumatic. Moist oral mucosal membranes  Eyes: Anicteric  Lungs:  Clear to auscultation normal effort  Heart: Regular rate and rhythm  Abdomen:  Soft, nontender  Extremities: trace peripheral edema.  Neurologic: Alert, moving all four extremities  Skin: No lesions.  Scrotal edema and tenderness  Access: Rt chest permcath placed on 07/02/23 by Dr Wyn Quaker    Basic Metabolic Panel: Recent Labs  Lab 06/28/23 1232 06/29/23 1610 06/30/23 0626 07/01/23 0634 07/02/23 0258 07/03/23 0719  NA  --  132* 133* 136 133* 137  K  --  4.1 4.0 4.3 4.2 4.5  CL  --  104 107 111 108 114*  CO2  --  16* 15* 16* 13* 13*  GLUCOSE  --  101* 116* 100* 94 96  BUN  --  46* 60* 72* 81* 86*  CREATININE  --  5.88* 8.06* 9.46* 9.86* 10.14*  CALCIUM  --  7.8* 7.7* 8.1* 8.0* 8.0*  MG 2.3  --   --   --   --   --     Liver Function Tests: Recent Labs  Lab  06/28/23 1123 06/29/23 0613 06/30/23 0626 07/01/23 0634 07/02/23 0258  AST 98* 70* 77* 51* 37  ALT 76* 58* 57* 49* 44  ALKPHOS 156* 139* 157* 169* 159*  BILITOT 1.9* 1.7* 1.3* 1.2 1.1  PROT 7.9 6.8 7.0 6.5 6.7  ALBUMIN 3.3* 2.7* 2.6* 2.5* 2.5*   No results for input(s): "LIPASE", "AMYLASE" in the last 168 hours. No results for input(s): "AMMONIA" in the last 168 hours.  CBC: Recent Labs  Lab 06/28/23 1123 06/29/23 0613 07/01/23 0634 07/03/23 0719  WBC 17.5* 16.6* 12.1* 12.6*  HGB 9.1* 8.1* 8.2* 8.0*  HCT 26.6* 23.5* 23.8* 22.8*  MCV 94.3 93.6 92.6 92.3  PLT 166 144* 179 223    Cardiac Enzymes: Recent Labs  Lab 06/28/23 1232 06/29/23 0613  CKTOTAL 109 106    BNP: Invalid input(s): "POCBNP"  CBG: No results for input(s): "GLUCAP" in the last 168 hours.  Microbiology: Results for orders placed or performed during the hospital encounter of 06/28/23  Blood culture (single)     Status: None   Collection Time: 06/28/23 12:22 PM   Specimen: BLOOD  Result Value Ref Range Status   Specimen Description BLOOD LEFT ANTECUBITAL  Final   Special Requests   Final    BOTTLES DRAWN AEROBIC AND ANAEROBIC Blood Culture adequate volume   Culture   Final  NO GROWTH 5 DAYS Performed at [facility], [address] [place], [place] [address]    Report Status 07/03/2023 FINAL  Final  SARS Coronavirus 2 by RT PCR (hospital order, performed in [facility] hospital lab) *cepheid single result test* Anterior Nasal Swab     Status: None   Collection Time: 06/29/23 12:15 AM   Specimen: Anterior Nasal Swab  Result Value Ref Range Status   SARS Coronavirus 2 by RT PCR NEGATIVE NEGATIVE Final    Comment: (NOTE) SARS-CoV-2 target nucleic acids are NOT DETECTED.  The SARS-CoV-2 RNA is generally detectable in upper and lower respiratory specimens during the acute phase of infection. The lowest concentration of SARS-CoV-2 viral copies this assay can detect is 250 copies /  mL. A negative result does not preclude SARS-CoV-2 infection and should not be used as the sole basis for treatment or other patient management decisions.  A negative result may occur with improper specimen collection / handling, submission of specimen other than nasopharyngeal swab, presence of viral mutation(s) within the areas targeted by this assay, and inadequate number of viral copies (<250 copies / mL). A negative result must be combined with clinical observations, patient history, and epidemiological information.  Fact Sheet for Patients:   RoadLapTop.co.za  Fact Sheet for Healthcare Providers: http://kim-miller.com/  This test is not yet approved or  cleared by the [place] FDA and has been authorized for detection and/or diagnosis of SARS-CoV-2 by FDA under an Emergency Use Authorization (EUA).  This EUA will remain in effect (meaning this test can be used) for the duration of the COVID-19 declaration under Section 564(b)(1) of the Act, 21 U.S.C. section 360bbb-3(b)(1), unless the authorization is terminated or revoked sooner.  Performed at [facility], [address] [place], [place] [address]   Chlamydia/NGC rt PCR [facility] only)     Status: None   Collection Time: 06/29/23 10:42 AM   Specimen: Urine; GU  Result Value Ref Range Status   Specimen source GC/Chlam URINE, RANDOM  Final   Chlamydia Tr NOT DETECTED NOT DETECTED Final   N gonorrhoeae NOT DETECTED NOT DETECTED Final    Comment: (NOTE) This CT/NG assay has not been evaluated in patients with a history of  hysterectomy. Performed at [facility], [address] [place], [place] [address]     Coagulation Studies: No results for input(s): "LABPROT", "INR" in the last 72 hours.   Urinalysis: No results for input(s): "COLORURINE", "LABSPEC", "PHURINE", "GLUCOSEU", "HGBUR", "BILIRUBINUR", "KETONESUR", "PROTEINUR", "UROBILINOGEN",  "NITRITE", "LEUKOCYTESUR" in the last 72 hours.  Invalid input(s): "APPERANCEUR"     Imaging: PERIPHERAL VASCULAR CATHETERIZATION  Result Date: 07/02/2023 See surgical note for result.  [place] RT LOWER EXTREM LTD SOFT TISSUE NON VASCULAR  Result Date: 07/01/2023 CLINICAL DATA:  Right inguinal abscess EXAM: ULTRASOUND right LOWER EXTREMITY LIMITED TECHNIQUE: Ultrasound examination of the lower extremity soft tissues was performed in the area of clinical concern. COMPARISON:  Scrotal ultrasound 06/29/2023 FINDINGS: Targeted sonography in the region of concern described as lateral inferior scrotum is performed. Edematous soft tissues with probable areas of air within. Heterogenous tissue deep to the skin wound contains vascularity and presumably represents inflammatory tissue. No focal fluid collection is seen. IMPRESSION: Heterogenous tissue deep to the skin wound contains vascularity and presumably represents inflammatory tissue. No focal fluid collection is seen. Electronically Signed   By: Jasmine Pang M.D.   On: 07/01/2023 18:33     Medications:    sodium chloride 125 mL/hr at  07/03/23 0202    amLODipine  10 mg Oral QPM   aspirin EC  81 mg Oral Daily   carvedilol  25 mg Oral BID WC   Chlorhexidine Gluconate Cloth  6 each Topical Q0600   clopidogrel  75 mg Oral Daily   doxycycline  100 mg Oral Q12H   folic acid  1 mg Oral Daily   heparin  5,000 Units Subcutaneous Q8H   lactulose  20 g Oral Once   levETIRAcetam  500 mg Oral BID   nystatin   Topical BID   pantoprazole  40 mg Oral Daily   sodium bicarbonate  650 mg Oral TID   thiamine  100 mg Oral Daily   acetaminophen **OR** acetaminophen, alteplase, fentaNYL (SUBLIMAZE) injection, guaiFENesin-dextromethorphan, heparin, LORazepam, melatonin, nicotine, ondansetron **OR** ondansetron (ZOFRAN) IV, ondansetron (ZOFRAN) IV, senna-docusate  Assessment/ Plan:  Gary [name] is a 52 y.o.  male with medical problems of hypertension,  alcohol abuse, hyperlipidemia, history of TIA, GERD, erectile dysfunction, tobacco abuse, history of seizures  was admitted on 06/28/2023 for Shortness of breath [R06.02] SOB (shortness of breath) [R06.02] AKI (acute kidney injury) (HCC) [N17.9] Anemia, unspecified type [D64.9]   Acute kidney injury on chronic kidney disease stage IIIa. AKI secondary to severe ATN with chronic NSAID use Hematuria Proteinuria. Elevated liver enzymes Hypoalbuminemia Fatty liver Alcohol abuse Current smoker Scrotal Abscess  Lab Results  Component Value Date   CREATININE 10.14 (H) 07/03/2023   CREATININE 9.86 (H) 07/02/2023   CREATININE 9.46 (H) 07/01/2023    Intake/Output Summary (Last 24 hours) at 07/03/2023 1012 Last data filed at 07/03/2023 0443 Gross per 24 hour  Intake 2668.08 ml  Output 750 ml  Net 1918.08 ml   Baseline creatinine of 1.61, GFR 51 from 06/13/2023.  Presenting creatinine of 4.18, GFR 16.  In addition patient has elevated liver enzymes suggesting hepatitis possibly alcoholic hepatitis.  Albumin is low.  Diffuse echogenic liver consistent with fatty infiltration. Urinalysis with moderate blood, 0-5 RBCs, 0-5 WBCs, 100 mg of protein. Renal ultrasound is negative for obstruction, mass.   Differential diagnosis includes nonsteroidals induced AKI, FSGS, glomerulonephritis, infection   Protein creatinine ratio 2.23, CK 106, UDS negative   Plan: Creatinine remains elevated Appreciate vascular surgery placing right chest PermCath on 07/02/2023 Patient received initial dialysis treatment today and will receive treatment for the next 2 days. Renal navigator aware of patient and will begin outpatient clinic search. Urology consult -low suspicion of epididymitis, continue oral antibiotics.  Scrotal ultrasound negative for fluid collection Continue doxycycline    LOS: 5 Gary [name] 9/25/202410:12 AM

## 2023-07-03 NOTE — Plan of Care (Signed)

## 2023-07-04 LAB — BASIC METABOLIC PANEL
Anion gap: 9 (ref 5–15)
BUN: 65 mg/dL — ABNORMAL HIGH (ref 6–20)
CO2: 19 mmol/L — ABNORMAL LOW (ref 22–32)
Calcium: 7.9 mg/dL — ABNORMAL LOW (ref 8.9–10.3)
Chloride: 109 mmol/L (ref 98–111)
Creatinine, Ser: 7.94 mg/dL — ABNORMAL HIGH (ref 0.61–1.24)
GFR, Estimated: 8 mL/min — ABNORMAL LOW (ref >60)
Glucose, Bld: 98 mg/dL (ref 70–99)
Potassium: 3.7 mmol/L (ref 3.5–5.1)
Sodium: 137 mmol/L (ref 135–145)

## 2023-07-04 LAB — MAGNESIUM: Magnesium: 1.7 mg/dL (ref 1.7–2.4)

## 2023-07-04 LAB — CBC
HCT: 20.8 % — ABNORMAL LOW (ref 39.0–52.0)
Hemoglobin: 7.3 g/dL — ABNORMAL LOW (ref 13.0–17.0)
MCH: 31.9 pg (ref 26.0–34.0)
MCHC: 35.1 g/dL (ref 30.0–36.0)
MCV: 90.8 fL (ref 80.0–100.0)
Platelets: 224 10*3/uL (ref 150–400)
RBC: 2.29 MIL/uL — ABNORMAL LOW (ref 4.22–5.81)
RDW: 12.5 % (ref 11.5–15.5)
WBC: 13.2 10*3/uL — ABNORMAL HIGH (ref 4.0–10.5)
nRBC: 0 % (ref 0.0–0.2)

## 2023-07-04 LAB — VITAMIN B12: Vitamin B-12: 1313 pg/mL — ABNORMAL HIGH (ref 180–914)

## 2023-07-04 LAB — VITAMIN D 25 HYDROXY (VIT D DEFICIENCY, FRACTURES): Vit D, 25-Hydroxy: 23.22 ng/mL — ABNORMAL LOW (ref 30–100)

## 2023-07-04 LAB — PHOSPHORUS: Phosphorus: 4.7 mg/dL — ABNORMAL HIGH (ref 2.5–4.6)

## 2023-07-04 MED ORDER — VITAMIN D (ERGOCALCIFEROL) 1.25 MG (50000 UNIT) PO CAPS
50000.0000 [IU] | ORAL_CAPSULE | ORAL | Status: DC
  Administered 2023-07-04: 50000 [IU] via ORAL
  Filled 2023-07-04: qty 1

## 2023-07-04 NOTE — Consult Note (Signed)
Subjective:   CC: Gallbladder lesion  HPI:  Gary [name] is a 52 y.o. male who was consulted by Lucianne Muss for evaluation of above.  First noted few day ago.  Asymptomatic, incidentally noted as part of workup for elevated LFTs.  Patient denies any specific abdominal pain or nausea vomiting at this time.  Past Medical History:  has a past medical history of Alcohol use, Carotid artery occlusion, GERD (gastroesophageal reflux disease), Hypercholesteremia, Hypertension, Seizures (HCC), Stroke (HCC), and Tobacco abuse.  Past Surgical History:  has a past surgical history that includes No past surgeries and DIALYSIS/PERMA CATHETER INSERTION (N/A, 07/02/2023).  Family History: family history includes Healthy in his sister; Heart attack in his mother; Hypertension in his mother; Other in his father, maternal grandmother, paternal grandfather, and paternal grandmother; Other (age of onset: 71) in his maternal grandfather.  Social History:  reports that he has been smoking cigarettes. He has a 8.5 pack-year smoking history. He has never used smokeless tobacco. He reports current alcohol use of about 21.0 standard drinks of alcohol per week. He reports that he does not use drugs.  Current Medications:  Prior to Admission medications   Medication Sig Start Date End Date Taking? Authorizing Provider  amLODipine (NORVASC) 10 MG tablet Take 1 tablet (10 mg total) by mouth every evening. 06/20/23  Yes Iloabachie, Chioma E, NP  aspirin 81 MG tablet Take 1 tablet (81 mg total) by mouth daily. 11/01/17  Yes Kallie Locks, FNP  carvedilol (COREG) 25 MG tablet Take 1 tablet (25 mg total) by mouth 2 (two) times daily with a meal. 06/20/23  Yes Iloabachie, Chioma E, NP  clopidogrel (PLAVIX) 75 MG tablet Take 1 tablet (75 mg total) by mouth daily. 06/20/23 06/19/24 Yes Iloabachie, Chioma E, NP  folic acid (FOLVITE) 1 MG tablet Take 1 tablet (1 mg total) by mouth daily. 12/20/22  Yes Iloabachie, Chioma E, NP  ibuprofen  (ADVIL) 200 MG tablet Take 800 mg by mouth daily as needed for headache or moderate pain (usually takes daily for headaches, leg pain in am).   Yes [provider]  levETIRAcetam (KEPPRA) 1000 MG tablet Take 1 tablet (1,000 mg total) by mouth 2 (two) times daily. 06/20/23  Yes Iloabachie, Chioma E, NP  losartan (COZAAR) 100 MG tablet Take 1 tablet (100 mg total) by mouth in the morning. 03/07/23  Yes Iloabachie, Chioma E, NP  magnesium oxide (MAG-OX) 400 MG tablet Take 1 tablet (400 mg total) by mouth daily. 07/12/22  Yes Tukov-Yual, Magdalene S, NP  pantoprazole (PROTONIX) 40 MG tablet Take 1 tablet (40 mg total) by mouth daily. 06/20/23  Yes Iloabachie, Chioma E, NP  rosuvastatin (CRESTOR) 40 MG tablet Take 1 tablet (40 mg total) by mouth daily. 04/25/23 07/28/23 Yes Agbor-Etang, Arlys John, MD  sildenafil (VIAGRA) 100 MG tablet Take 1 tablet 1 hour prior to intercourse. 07/02/22  Yes Stoioff, Verna Czech, MD  thiamine (VITAMIN B-1) 100 MG tablet Take 1 tablet (100 mg total) by mouth daily. 12/20/22  Yes Iloabachie, Chioma E, NP  blood glucose meter kit and supplies KIT Use as directed to check blood glucose 2-3 times per week and as needed 02/22/22   Tukov-Yual, Alroy Bailiff, NP  glucose blood (RIGHTEST GS550 BLOOD GLUCOSE) test strip Use as directed to check blood glucose 2-3 times per week and as needed 02/22/22   Andreas Ohm, NP  Rightest GL300 Lancets MISC Use as directed to check blood glucose 2-3 times per week and as needed 02/22/22  Tukov-Yual, Magdalene S, NP  vitamin B-12 (CYANOCOBALAMIN) 500 MCG tablet Take 2 tablets (1,000 mcg total) by mouth daily. Patient not taking: Reported on 06/28/2023 01/29/23   Iloabachie, Chioma E, NP    Allergies:  No Known Allergies  ROS:  General: Denies weight loss, weight gain, fatigue, fevers, chills, and night sweats. Eyes: Denies blurry vision, double vision, eye pain, itchy eyes, and tearing. Ears: Denies hearing loss, earache, and ringing in  ears. Nose: Denies sinus pain, congestion, infections, runny nose, and nosebleeds. Mouth/throat: Denies hoarseness, sore throat, bleeding gums, and difficulty swallowing. Heart: Denies chest pain, palpitations, racing heart, irregular heartbeat, leg pain or swelling, and decreased activity tolerance. Respiratory: Denies breathing difficulty, shortness of breath, wheezing, cough, and sputum. GI: Denies change in appetite, heartburn, nausea, vomiting, constipation, diarrhea, and blood in stool. GU: Denies difficulty urinating, pain with urinating, urgency, frequency, blood in urine. Musculoskeletal: Denies joint stiffness, pain, swelling, muscle weakness. Skin: Denies rash, itching, mass, tumors, sores, and boils Neurologic: Denies headache, fainting, dizziness, seizures, numbness, and tingling. Psychiatric: Denies depression, anxiety, difficulty sleeping, and memory loss. Endocrine: Denies heat or cold intolerance, and increased thirst or urination. Blood/lymph: Denies easy bruising, easy bruising, and swollen glands     Objective:     BP (!) 172/82 (BP Location: Left Arm)   Pulse 70   Temp 98.5 F (36.9 C)   Resp 18   Ht 5\' 10"  (1.778 m)   Wt 100.5 kg   SpO2 98%   BMI 31.79 kg/m   Constitutional :  alert, cooperative, appears stated age, and no distress  Lymphatics/Throat:  no asymmetry, masses, or scars, PermCath in place right side  Respiratory:  clear to auscultation bilaterally  Cardiovascular:  regular rate and rhythm  Gastrointestinal: soft, non-tender; bowel sounds normal; no masses,  no organomegaly.  Musculoskeletal: Steady gait and movement  Skin: Cool and moist  Psychiatric: Normal affect, non-agitated, not confused       LABS:     Latest Ref Rng & Units 07/04/2023    3:09 AM 07/03/2023    7:19 AM 07/02/2023    2:58 AM  CMP  Glucose 70 - 99 mg/dL 98  96  94   BUN 6 - 20 mg/dL 65  86  81   Creatinine 0.61 - 1.24 mg/dL 4.09  81.19  1.47   Sodium 135 - 145  mmol/L 137  137  133   Potassium 3.5 - 5.1 mmol/L 3.7  4.5  4.2   Chloride 98 - 111 mmol/L 109  114  108   CO2 22 - 32 mmol/L 19  13  13    Calcium 8.9 - 10.3 mg/dL 7.9  8.0  8.0   Total Protein 6.5 - 8.1 g/dL   6.7   Total Bilirubin 0.3 - 1.2 mg/dL   1.1   Alkaline Phos 38 - 126 U/L   159   AST 15 - 41 U/L   37   ALT 0 - 44 U/L   44       Latest Ref Rng & Units 07/04/2023    3:09 AM 07/03/2023    7:19 AM 07/01/2023    6:34 AM  CBC  WBC 4.0 - 10.5 K/uL 13.2  12.6  12.1   Hemoglobin 13.0 - 17.0 g/dL 7.3  8.0  8.2   Hematocrit 39.0 - 52.0 % 20.8  22.8  23.8   Platelets 150 - 400 K/uL 224  223  179     RADS: CLINICAL DATA:  Gallbladder wall thickening identified by prior ultrasound   EXAM: MRI ABDOMEN WITHOUT CONTRAST  (INCLUDING MRCP)   TECHNIQUE: Multiplanar multisequence MR imaging of the abdomen was performed. Heavily T2-weighted images of the biliary and pancreatic ducts were obtained, and three-dimensional MRCP images were rendered by post processing.   COMPARISON:  Right upper quadrant ultrasound, 06/28/2023   FINDINGS: Lower chest: Small bilateral pleural effusions.   Hepatobiliary: No solid liver abnormality is seen. Hepatic steatosis. Diffuse gallbladder wall thickening. No gallstones or biliary dilatation.   Pancreas: Unremarkable. No pancreatic ductal dilatation or surrounding inflammatory changes.   Spleen: Normal in size without significant abnormality.   Adrenals/Urinary Tract: Adrenal glands are unremarkable. Kidneys are normal, without renal calculi, solid lesion, or hydronephrosis.   Stomach/Bowel: Stomach is within normal limits. No evidence of bowel wall thickening, distention, or inflammatory changes.   Vascular/Lymphatic: No significant vascular findings are present. No enlarged abdominal lymph nodes.   Other: No abdominal wall hernia. Mild anasarca. Trace perihepatic ascites.   Musculoskeletal: No acute or significant osseous findings.    IMPRESSION: 1. Diffuse gallbladder wall thickening. No gallstones or biliary dilatation. No focal lesion per findings of prior ultrasound. 2. Hepatic steatosis. 3. Small bilateral pleural effusions. 4. Trace perihepatic ascites and anasarca.     Electronically Signed   By: Jearld Lesch M.D.   On: 07/03/2023 18:47  CLINICAL DATA:  Right upper quadrant pain for 2 days   EXAM: ULTRASOUND ABDOMEN LIMITED RIGHT UPPER QUADRANT   COMPARISON:  Ultrasound 02/21/2023   FINDINGS: Gallbladder:   Mildly distended gallbladder. No shadowing stones. There is mild gallbladder wall thickening of 5 mm of uncertain etiology. There is also a hypoechoic area along the margin of the gallbladder measuring 19 x 7 x 5 mm. Underlying lesion is possible. No blood flow to this area on Doppler.   Common bile duct:   Diameter: 4 mm   Liver:   Diffusely echogenic hepatic parenchyma consistent with fatty liver infiltration. Portal vein is patent on color Doppler imaging with normal direction of blood flow towards the liver.   Other: None.   IMPRESSION: No gallstones. However gallbladder wall has a diffuse thickening with a focal nodular area measuring up to 19 by 7 x 5 mm. Underlying lesion is possible. Please correlate with any prior or follow up MRI with and without contrast when appropriate to further delineate.   No biliary ductal dilatation.  Fatty liver infiltration.     Electronically Signed   By: Karen Kays M.D.   On: 06/28/2023 15:06  Assessment:    Gallbladder wall thickening and questionable lesion as noted above.  MRCP confirms no discrete lesion.  Patient otherwise asymptomatic from a gallbladder standpoint.   Plan:     Wall thickening likely reactive from the anasarca and perihepatic fluid noted on MRCP.  LFTs are slowly downtrending so recommend continue monitoring for now.  No indication for any surgical intervention.  Surgery to sign off.  Please call with any  additional questions or concerns.  No specific recommendations regarding follow-up regarding this wall thickening as long as patient remains asymptomatic.  The patient verbalized understanding and all questions were answered to the patient's satisfaction.  labs/images/medications/previous chart entries reviewed personally and relevant changes/updates noted above.

## 2023-07-04 NOTE — Progress Notes (Signed)
[facility]  ROUNDING NOTE   Subjective:   Patient seen and evaluated during dialysis   HEMODIALYSIS FLOWSHEET:  Blood Flow Rate (mL/min): 199 mL/min Arterial Pressure (mmHg): -80.8 mmHg Venous Pressure (mmHg): 59.39 mmHg TMP (mmHg): 16.56 mmHg Ultrafiltration Rate (mL/min): 70 mL/min Dialysate Flow Rate (mL/min): 299 ml/min  Tolerating initial treatment well.   Urine output 1200 ml  Objective:  Vital signs in last 24 hours:  Temp:  [98 F (36.7 C)-98.9 F (37.2 C)] 98.9 F (37.2 C) (09/26 1043) Pulse Rate:  [62-75] 67 (09/26 1043) Resp:  [16-22] 18 (09/26 1043) BP: (146-174)/(81-97) 174/86 (09/26 1043) SpO2:  [98 %-100 %] 100 % (09/26 1043) Weight:  [100.5 kg] 100.5 kg (09/26 1050)  Weight change:  Filed Weights   07/03/23 1033 07/04/23 0751 07/04/23 1050  Weight: 99.5 kg 100.5 kg 100.5 kg    Intake/Output: I/O last 3 completed shifts: In: 2743 [I.V.:2743] Out: 1950 [Urine:1950]   Intake/Output this shift:  Total I/O In: -  Out: 400 [Urine:400]  Physical Exam: General: NAD  Head: Normocephalic, atraumatic. Moist oral mucosal membranes  Eyes: Anicteric  Lungs:  Clear to auscultation normal effort  Heart: Regular rate and rhythm  Abdomen:  Soft, nontender  Extremities: trace peripheral edema.  Neurologic: Alert, moving all four extremities  Skin: No lesions.  Scrotal edema and tenderness  Access: Rt chest permcath placed on 07/02/23 by Dr Wyn Quaker    Basic Metabolic Panel: Recent Labs  Lab 06/28/23 1232 06/29/23 4098 06/30/23 1191 07/01/23 4782 07/02/23 0258 07/03/23 0719 07/04/23 0309  NA  --    < > 133* 136 133* 137 137  K  --    < > 4.0 4.3 4.2 4.5 3.7  CL  --    < > 107 111 108 114* 109  CO2  --    < > 15* 16* 13* 13* 19*  GLUCOSE  --    < > 116* 100* 94 96 98  BUN  --    < > 60* 72* 81* 86* 65*  CREATININE  --    < > 8.06* 9.46* 9.86* 10.14* 7.94*  CALCIUM  --    < > 7.7* 8.1* 8.0* 8.0* 7.9*  MG 2.3  --   --   --   --   --   1.7  PHOS  --   --   --   --   --   --  4.7*   < > = values in this interval not displayed.    Liver Function Tests: Recent Labs  Lab 06/28/23 1123 06/29/23 0613 06/30/23 0626 07/01/23 0634 07/02/23 0258  AST 98* 70* 77* 51* 37  ALT 76* 58* 57* 49* 44  ALKPHOS 156* 139* 157* 169* 159*  BILITOT 1.9* 1.7* 1.3* 1.2 1.1  PROT 7.9 6.8 7.0 6.5 6.7  ALBUMIN 3.3* 2.7* 2.6* 2.5* 2.5*   No results for input(s): "LIPASE", "AMYLASE" in the last 168 hours. No results for input(s): "AMMONIA" in the last 168 hours.  CBC: Recent Labs  Lab 06/28/23 1123 06/29/23 0613 07/01/23 0634 07/03/23 0719 07/04/23 0309  WBC 17.5* 16.6* 12.1* 12.6* 13.2*  HGB 9.1* 8.1* 8.2* 8.0* 7.3*  HCT 26.6* 23.5* 23.8* 22.8* 20.8*  MCV 94.3 93.6 92.6 92.3 90.8  PLT 166 144* 179 223 224    Cardiac Enzymes: Recent Labs  Lab 06/28/23 1232 06/29/23 0613  CKTOTAL 109 106    BNP: Invalid input(s): "POCBNP"  CBG: No results for input(s): "GLUCAP" in the last  168 hours.  Microbiology: Results for orders placed or performed during the hospital encounter of 06/28/23  Blood culture (single)     Status: None   Collection Time: 06/28/23 12:22 PM   Specimen: BLOOD  Result Value Ref Range Status   Specimen Description BLOOD LEFT ANTECUBITAL  Final   Special Requests   Final    BOTTLES DRAWN AEROBIC AND ANAEROBIC Blood Culture adequate volume   Culture   Final    NO GROWTH 5 DAYS Performed at [facility], [address], [place], [place] [address]    Report Status 07/03/2023 FINAL  Final  SARS Coronavirus 2 by RT PCR (hospital order, performed in [facility] hospital lab) *cepheid single result test* Anterior Nasal Swab     Status: None   Collection Time: 06/29/23 12:15 AM   Specimen: Anterior Nasal Swab  Result Value Ref Range Status   SARS Coronavirus 2 by RT PCR NEGATIVE NEGATIVE Final    Comment: (NOTE) SARS-CoV-2 target nucleic acids are NOT DETECTED.  The SARS-CoV-2 RNA is generally  detectable in upper and lower respiratory specimens during the acute phase of infection. The lowest concentration of SARS-CoV-2 viral copies this assay can detect is 250 copies / mL. A negative result does not preclude SARS-CoV-2 infection and should not be used as the sole basis for treatment or other patient management decisions.  A negative result may occur with improper specimen collection / handling, submission of specimen other than nasopharyngeal swab, presence of viral mutation(s) within the areas targeted by this assay, and inadequate number of viral copies (<250 copies / mL). A negative result must be combined with clinical observations, patient history, and epidemiological information.  Fact Sheet for Patients:   RoadLapTop.co.za  Fact Sheet for Healthcare Providers: http://kim-miller.com/  This test is not yet approved or  cleared by the [place] FDA and has been authorized for detection and/or diagnosis of SARS-CoV-2 by FDA under an Emergency Use Authorization (EUA).  This EUA will remain in effect (meaning this test can be used) for the duration of the COVID-19 declaration under Section 564(b)(1) of the Act, 21 U.S.C. section 360bbb-3(b)(1), unless the authorization is terminated or revoked sooner.  Performed at [facility], [address] [place], [place] [address]   Chlamydia/NGC rt PCR [facility] only)     Status: None   Collection Time: 06/29/23 10:42 AM   Specimen: Urine; GU  Result Value Ref Range Status   Specimen source GC/Chlam URINE, RANDOM  Final   Chlamydia Tr NOT DETECTED NOT DETECTED Final   N gonorrhoeae NOT DETECTED NOT DETECTED Final    Comment: (NOTE) This CT/NG assay has not been evaluated in patients with a history of  hysterectomy. Performed at [facility], [address] [place], [place] [address]     Coagulation Studies: No results for input(s): "LABPROT", "INR" in the  last 72 hours.   Urinalysis: No results for input(s): "COLORURINE", "LABSPEC", "PHURINE", "GLUCOSEU", "HGBUR", "BILIRUBINUR", "KETONESUR", "PROTEINUR", "UROBILINOGEN", "NITRITE", "LEUKOCYTESUR" in the last 72 hours.  Invalid input(s): "APPERANCEUR"     Imaging: MR ABDOMEN MRCP WO CONTRAST  Result Date: 07/03/2023 CLINICAL DATA:  Gallbladder wall thickening identified by prior ultrasound EXAM: MRI ABDOMEN WITHOUT CONTRAST  (INCLUDING MRCP) TECHNIQUE: Multiplanar multisequence MR imaging of the abdomen was performed. Heavily T2-weighted images of the biliary and pancreatic ducts were obtained, and three-dimensional MRCP images were rendered by post processing. COMPARISON:  Right upper quadrant ultrasound, 06/28/2023 FINDINGS: Lower chest: Small bilateral pleural effusions. Hepatobiliary:  No solid liver abnormality is seen. Hepatic steatosis. Diffuse gallbladder wall thickening. No gallstones or biliary dilatation. Pancreas: Unremarkable. No pancreatic ductal dilatation or surrounding inflammatory changes. Spleen: Normal in size without significant abnormality. Adrenals/Urinary Tract: Adrenal glands are unremarkable. Kidneys are normal, without renal calculi, solid lesion, or hydronephrosis. Stomach/Bowel: Stomach is within normal limits. No evidence of bowel wall thickening, distention, or inflammatory changes. Vascular/Lymphatic: No significant vascular findings are present. No enlarged abdominal lymph nodes. Other: No abdominal wall hernia. Mild anasarca. Trace perihepatic ascites. Musculoskeletal: No acute or significant osseous findings. IMPRESSION: 1. Diffuse gallbladder wall thickening. No gallstones or biliary dilatation. No focal lesion per findings of prior ultrasound. 2. Hepatic steatosis. 3. Small bilateral pleural effusions. 4. Trace perihepatic ascites and anasarca. Electronically Signed   By: Jearld Lesch M.D.   On: 07/03/2023 18:47   PERIPHERAL VASCULAR CATHETERIZATION  Result Date:  07/02/2023 See surgical note for result.    Medications:    sodium chloride 125 mL/hr at 07/04/23 4098    amLODipine  10 mg Oral QPM   aspirin EC  81 mg Oral Daily   carvedilol  25 mg Oral BID WC   Chlorhexidine Gluconate Cloth  6 each Topical Q0600   clopidogrel  75 mg Oral Daily   doxycycline  100 mg Oral Q12H   folic acid  1 mg Oral Daily   heparin  5,000 Units Subcutaneous Q8H   lactulose  20 g Oral Once   levETIRAcetam  500 mg Oral BID   nystatin   Topical BID   pantoprazole  40 mg Oral Daily   sodium bicarbonate  650 mg Oral TID   thiamine  100 mg Oral Daily   fentaNYL (SUBLIMAZE) injection, guaiFENesin-dextromethorphan, LORazepam, melatonin, nicotine, ondansetron (ZOFRAN) IV, senna-docusate  Assessment/ Plan:  [name] is a 52 y.o.  male with medical problems of hypertension, alcohol abuse, hyperlipidemia, history of TIA, GERD, erectile dysfunction, tobacco abuse, history of seizures  was admitted on 06/28/2023 for Shortness of breath [R06.02] SOB (shortness of breath) [R06.02] AKI (acute kidney injury) (HCC) [N17.9] Anemia, unspecified type [D64.9]   Acute kidney injury on chronic kidney disease stage IIIa. AKI secondary to severe ATN with chronic NSAID use Hematuria Proteinuria. Elevated liver enzymes Hypoalbuminemia Fatty liver Alcohol abuse Current smoker Scrotal Abscess  Lab Results  Component Value Date   CREATININE 7.94 (H) 07/04/2023   CREATININE 10.14 (H) 07/03/2023   CREATININE 9.86 (H) 07/02/2023    Intake/Output Summary (Last 24 hours) at 07/04/2023 1106 Last data filed at 07/04/2023 1059 Gross per 24 hour  Intake 2742.97 ml  Output 1600 ml  Net 1142.97 ml   Baseline creatinine of 1.61, GFR 51 from 06/13/2023.  Presenting creatinine of 4.18, GFR 16.  In addition patient has elevated liver enzymes suggesting hepatitis possibly alcoholic hepatitis.  Albumin is low.  Diffuse echogenic liver consistent with fatty infiltration. Urinalysis  with moderate blood, 0-5 RBCs, 0-5 WBCs, 100 mg of protein. Renal ultrasound is negative for obstruction, mass.   Differential diagnosis includes nonsteroidals induced AKI, FSGS, glomerulonephritis, infection   Protein creatinine ratio 2.23, CK 106, UDS negative   Plan: Appreciate vascular surgery placing right chest PermCath on 07/02/2023 Patient received second dialysis treatment today, tolerated treatment well. Low to no UF due to adequate urine output.  Renal navigator aware of patient and will begin outpatient clinic search. Urology consult -low suspicion of epididymitis, continue oral antibiotics.  Scrotal ultrasound negative for fluid collection Remains on doxycycline, symptoms improving.  LOS: 6 Riel Hirschman 9/26/202411:06 AM

## 2023-07-04 NOTE — Progress Notes (Signed)
  Received patient in bed to unit.   Informed consent signed and in chart.    TX duration: 2.5hrs     Transported back to floor  Hand-off given to patient's nurse.  No c/o and no distress noted    Access used: R HD Catheter Access issues: none   Total UF removed: 0L Medication(s) given: none Post HD VS: 179/84 Post HD weight: 100.5kg     Lynann Beaver  [facility]

## 2023-07-04 NOTE — Progress Notes (Signed)
Secure chat with Dr Lucianne Muss re PIV request.  Pt nephrology pt and attempt to preserve veinous access.  Dr Lucianne Muss states ok to leave PIV out.  Andrill RN aware.

## 2023-07-04 NOTE — Plan of Care (Signed)
  Problem: Education: Goal: Knowledge of disease or condition will improve Outcome: Progressing Goal: Knowledge of secondary prevention will improve (MUST DOCUMENT ALL) Outcome: Progressing   Problem: Ischemic Stroke/TIA Tissue Perfusion: Goal: Complications of ischemic stroke/TIA will be minimized Outcome: Progressing   Problem: Education: Goal: Knowledge of General Education information will improve Description: Including pain rating scale, medication(s)/side effects and non-pharmacologic comfort measures Outcome: Progressing   Problem: Nutrition: Goal: Adequate nutrition will be maintained Outcome: Progressing

## 2023-07-04 NOTE — Progress Notes (Signed)
[facility] Progress Note  Patient: Gary [name]    ZOX:[id number]  DOA: 06/28/2023     Date of Service: the patient was seen and examined on 07/04/2023  Chief Complaint  Patient presents with   Shortness of Breath   Brief hospital course: [name] is a 52 year old male with history of alcohol abuse, hypertension, hyperlipidemia, history of TIA, GERD, erectile dysfunction, tobacco use, history of seizure, who presents to the emergency department for chief concerns of shortness of breath and dizziness. 09/20: in ED, VSS, mild hyponatremia sodium 132, significantly elevated creatinine 4.18 w/ GFR 16, elevated LFT  AST 98 and ALT 76, Bili 1.9, ALP 156, BNP WNL, Troponin and CK WNL, WBC elevated 17.5, Hgb 9.1. CXR nonacute. Renal US normal no dilatation, Abd [place] (+)fatty liver, GB w/ diffuse wall thickening and focal nodular area measuring up to 19 by 7 x 5 mm concern for underlying lesion and advise compare w/ MRI. Given Protonix 80 mg IV one-time dose, sodium chloride 1 L bolus. Admitted to hospitalist service.  09/21: SOB improved, renal function worse Cr to 5.88, consulted to nephrology. Pt c/o scrotal pain, [place] (+)small bl hydrocele L varicocele L epididymal cyst - started tx for epididymitis given leukocytosis  09/22: early AM, scrotum apparently opened to drain - pt describes clear thin liquid, bandage appears serous fluid, no purulent, no erythema to skin - monitor. Cr worse, up to 8, nephrology following, continuing IV fluids.  09/23: Cr still worsening and may need to consider dialysis tomorrow, urology consult repeating US - no abscess 09/24: permcath placement and dialysis after that   Consultants:  Nephrology  Vascular surgery Urology    Procedures: none     Assessment and Plan: AKI (acute kidney injury) on CKD3a - worsening --> acute renal faillure  suspect multifactorial including prerenal in setting of poor p.o./fluid intake, NSAID use,  glomerulonephritis,  rhabdomyolysis, infection related AKI from epididymitis.  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 Monitor H&H Iron profile, and folate within normal range, B12 level elevated Patient may benefit from Epogen by nephrology   Shortness of breath without respiratory failure - resolved  Seems likely to be respiratory compensation for metabolic acidosis d/t renal failure  Differentials would include COPD history of tobacco use  CXR personally reviewed, no concerns  Neg troponin, ACS unlikely     Gallbladder wall thickening, ruled out via MRCP  [place] RUQ mass - nodular 19 mm x 7 mm x 5 mm  Associated w/ elevation in Bili, AST, ALT, ALP On 9/25 d/w general surgery, recommended MRCP without contrast and it did show gallbladder wall thickening.  Patient was seen by general surgery, asymptomatic, recommended no intervention.  Pt was seen by Dr. Tonna Boehringer   Elevated liver enzymes Suspect secondary to gallbladder disease as above, complicated by alcohol abuse  LFT trending down Hepatitis panel neg    History Seizures Calculated creatinine clearance is 13 mL/min on admission Patient on Keppra 1000 mg p.o. twice daily at home. Placed on Keppra reduced dosing to 500 mg p.o. twice daily in setting of acute kidney injury   Scrotal pain w/Small bilateral hydrocele Left-sided varicocele, 8 mm left epididymal cyst. Leukocytosis  Ceftriaxone x1 + doxycycline 100 mg bid x10 days (FQ would cover enteric pathogens but pt not sexually active for some time, FQ renal dosing problematic, will give doxy) started 09/21 Check for GC/CT in urine --> neg  Frequent dressing changes Urology following    Hyponatremia - stable, mild Likely d/t renal failure  Follow BMP   Metabolic acidosis Mild, secondary to acute kidney injury Sodium bicarbonate 325 mg p.o. twice daily, 2 doses ordered 9/25-sodium bicarbonate 650 mg p.o. 3 times daily order placed  History CVA Statin held for now given elevation LFT  Plavix    HLD  04/25/2023, cardiologist discontinued Pravachol and started patient on Crestor 40 mg w/ improvement in cholesterol and AST/ALT.  Hold statin for now w/ elevated LFT    Essential HTN On home amlodipine, carvedilol    GERD  PPI   Insomnia Melatonin 5 mg nightly as needed for sleep   Tobacco abuse As needed nicotine patch ordered   Vitamin D Insufficiency: started vitamin D 50,000 units p.o. weekly, follow with PCP to repeat vitamin D level after 3 to 6 months.  Body mass index is 31.79 kg/m.  Interventions:  Diet: Renal diet DVT Prophylaxis: Subcutaneous Heparin    Advance goals of care discussion: Full code  Family Communication: family was not present at bedside, at the time of interview.  The pt provided permission to discuss medical plan with the family. Opportunity was given to ask question and all questions were answered satisfactorily.   Disposition:  Pt is from Home, admitted with AKI, developed ESRD, started new hemodialysis, scrotal infection on antibiotics, gallbladder lesion, pending MRI and general surgery eval, which precludes a safe discharge. Discharge to Home, when stable, may need 1-2 more days to stay in the hospital.  Subjective: No significant events overnight, patient was seen after hemodialysis, tolerated procedure well.  Regarding his scrotal infection patient noticed some drainage in the potassium powder.  We will look at it tomorrow to see if it is healing well. No chest pain or palpitation, no shortness of breath.   Physical Exam: General: NAD, lying comfortably Appear in no distress, affect appropriate Eyes: PERRLA ENT:  Oral Mucosa Clear, moist  Neck: no JVD,  Cardiovascular: S1 and S2 Present, no Murmur,  Respiratory: good respiratory effort, Bilateral Air entry equal and Decreased, no Crackles, no wheezes Abdomen: Bowel Sound present, Soft and no tenderness,  Skin: no rashes Extremities: no Pedal edema, no calf tenderness Neurologic: without any new focal findings Gait not checked due to patient safety concerns  Vitals:   07/04/23 1030 07/04/23 1043 07/04/23 1050 07/04/23 1109  BP:  (!) 174/86  (!) 172/82  Pulse: 67 67  70  Resp:  18  18  Temp:  98.9 F (37.2 C)  98.5 F (36.9 C)  TempSrc:  Oral    SpO2: 99% 100%  98%  Weight:   100.5 kg   Height:        Intake/Output Summary (Last 24 hours) at 07/04/2023 1308 Last data filed at 07/04/2023 1059 Gross per 24 hour  Intake 2742.97 ml  Output 1600 ml  Net 1142.97 ml   Filed Weights   07/03/23 1033 07/04/23 0751 07/04/23 1050  Weight: 99.5 kg 100.5 kg 100.5 kg    Data Reviewed: I have personally reviewed and interpreted daily labs, tele strips, imagings as discussed above. I reviewed all nursing notes, pharmacy notes, vitals, pertinent old records I have discussed plan of care as described above with RN and patient/family.  CBC: Recent Labs  Lab 06/28/23 1123 06/29/23 0613 07/01/23 0634 07/03/23 0719 07/04/23 0309  WBC 17.5* 16.6* 12.1* 12.6* 13.2*  HGB 9.1* 8.1* 8.2* 8.0* 7.3*  HCT 26.6* 23.5* 23.8* 22.8* 20.8*  MCV 94.3 93.6 92.6 92.3 90.8  PLT 166 144* 179 223 224   Basic Metabolic Panel: Recent Labs  Lab 06/28/23 1232 06/29/23 0613 06/30/23 0626 07/01/23 0634 07/02/23 0258 07/03/23 0719 07/04/23 0309  NA  --    < > 133* 136 133* 137 137  K  --    < > 4.0 4.3 4.2 4.5 3.7  CL  --    < > 107 111 108 114* 109  CO2  --    < > 15* 16* 13* 13* 19*  GLUCOSE  --    < > 116* 100* 94 96 98  BUN  --    < > 60* 72* 81* 86* 65*  CREATININE  --    < > 8.06* 9.46* 9.86* 10.14* 7.94*  CALCIUM  --    < > 7.7* 8.1* 8.0* 8.0* 7.9*   MG 2.3  --   --   --   --   --  1.7  PHOS  --   --   --   --   --   --  4.7*   < > = values in this interval not displayed.    Studies: MR ABDOMEN MRCP WO CONTRAST  Result Date: 07/03/2023 CLINICAL DATA:  Gallbladder wall thickening identified by prior ultrasound EXAM: MRI ABDOMEN WITHOUT CONTRAST  (INCLUDING MRCP) TECHNIQUE: Multiplanar multisequence MR imaging of the abdomen was performed. Heavily T2-weighted images of the biliary and pancreatic ducts were obtained, and three-dimensional MRCP images were rendered by post processing. COMPARISON:  Right upper quadrant ultrasound, 06/28/2023 FINDINGS: Lower chest: Small bilateral pleural effusions. Hepatobiliary: No solid liver abnormality is seen. Hepatic steatosis. Diffuse gallbladder wall thickening. No gallstones or biliary dilatation. Pancreas: Unremarkable. No pancreatic ductal dilatation or surrounding inflammatory changes. Spleen: Normal in size without significant abnormality. Adrenals/Urinary Tract: Adrenal glands are unremarkable. Kidneys are normal, without renal calculi, solid lesion, or hydronephrosis. Stomach/Bowel: Stomach is within normal limits. No evidence of bowel wall thickening, distention, or inflammatory changes. Vascular/Lymphatic: No significant vascular findings are present. No enlarged abdominal lymph nodes. Other: No abdominal wall hernia. Mild anasarca. Trace perihepatic ascites. Musculoskeletal: No acute or significant osseous findings. IMPRESSION: 1. Diffuse gallbladder wall thickening. No gallstones or biliary dilatation. No focal lesion per findings of prior ultrasound. 2. Hepatic steatosis. 3. Small bilateral pleural effusions. 4. Trace perihepatic ascites and anasarca. Electronically Signed   By: Jearld Lesch M.D.   On: 07/03/2023 18:47    Scheduled Meds:  amLODipine  10 mg Oral QPM   aspirin EC  81 mg Oral Daily   carvedilol  25 mg Oral BID WC   Chlorhexidine Gluconate Cloth  6 each Topical Q0600   clopidogrel  75  mg Oral Daily   doxycycline  100 mg Oral Q12H   folic acid  1 mg Oral Daily   heparin  5,000 Units Subcutaneous Q8H   lactulose  20 g Oral Once   levETIRAcetam  500 mg Oral BID   nystatin   Topical BID   pantoprazole  40 mg Oral Daily   sodium bicarbonate  650 mg Oral TID   thiamine  100 mg Oral Daily   Continuous Infusions:  sodium chloride 125 mL/hr at 07/04/23 0607   PRN Meds: fentaNYL (SUBLIMAZE) injection, guaiFENesin-dextromethorphan, LORazepam, melatonin, nicotine,  ondansetron (ZOFRAN) IV, senna-docusate  Time spent: 35 minutes  Author: Gillis Santa. MD [facility] 07/04/2023 1:08 PM  To reach On-call, see care teams to locate the attending and reach out to them via [email or web address]. If 7PM-7AM, please contact night-coverage If you still have difficulty reaching the attending provider, please page the [facility] (Director on Call) for [facility] on amion for assistance.

## 2023-07-04 NOTE — Discharge Planning (Signed)
Following for OPHD placement. Referral sent to [facility]. Patient is uninsured, which may delay placement.   Gary [name] Dialysis Coordinator II  [facility] Cell: [phone] eFax: [phone] [email or web address]

## 2023-07-05 LAB — CBC
HCT: 21.4 % — ABNORMAL LOW (ref 39.0–52.0)
Hemoglobin: 7.3 g/dL — ABNORMAL LOW (ref 13.0–17.0)
MCH: 31.6 pg (ref 26.0–34.0)
MCHC: 34.1 g/dL (ref 30.0–36.0)
MCV: 92.6 fL (ref 80.0–100.0)
Platelets: 253 10*3/uL (ref 150–400)
RBC: 2.31 MIL/uL — ABNORMAL LOW (ref 4.22–5.81)
RDW: 12.5 % (ref 11.5–15.5)
WBC: 14.9 10*3/uL — ABNORMAL HIGH (ref 4.0–10.5)
nRBC: 0 % (ref 0.0–0.2)

## 2023-07-05 LAB — BASIC METABOLIC PANEL
Anion gap: 12 (ref 5–15)
BUN: 46 mg/dL — ABNORMAL HIGH (ref 6–20)
CO2: 23 mmol/L (ref 22–32)
Calcium: 8 mg/dL — ABNORMAL LOW (ref 8.9–10.3)
Chloride: 105 mmol/L (ref 98–111)
Creatinine, Ser: 6.31 mg/dL — ABNORMAL HIGH (ref 0.61–1.24)
GFR, Estimated: 10 mL/min — ABNORMAL LOW (ref >60)
Glucose, Bld: 93 mg/dL (ref 70–99)
Potassium: 3.5 mmol/L (ref 3.5–5.1)
Sodium: 140 mmol/L (ref 135–145)

## 2023-07-05 LAB — PHOSPHORUS: Phosphorus: 4.4 mg/dL (ref 2.5–4.6)

## 2023-07-05 LAB — MAGNESIUM: Magnesium: 1.6 mg/dL — ABNORMAL LOW (ref 1.7–2.4)

## 2023-07-05 MED ORDER — SODIUM BICARBONATE 650 MG PO TABS
650.0000 mg | ORAL_TABLET | Freq: Two times a day (BID) | ORAL | Status: DC
  Administered 2023-07-05: 650 mg via ORAL
  Filled 2023-07-05 (×2): qty 1

## 2023-07-05 MED ORDER — ACETAMINOPHEN 500 MG PO TABS
1000.0000 mg | ORAL_TABLET | Freq: Four times a day (QID) | ORAL | Status: DC | PRN
  Administered 2023-07-05: 1000 mg via ORAL
  Filled 2023-07-05: qty 2

## 2023-07-05 MED ORDER — HEPARIN SODIUM (PORCINE) 1000 UNIT/ML IJ SOLN
INTRAMUSCULAR | Status: AC
  Filled 2023-07-05: qty 10

## 2023-07-05 MED ORDER — ALTEPLASE 2 MG IJ SOLR: 2.0000 mg | Freq: Once | INTRAMUSCULAR | Status: DC | PRN

## 2023-07-05 MED ORDER — HEPARIN SODIUM (PORCINE) 1000 UNIT/ML DIALYSIS: 1000.0000 [IU] | INTRAMUSCULAR | Status: DC | PRN

## 2023-07-05 MED ORDER — MAGNESIUM OXIDE -MG SUPPLEMENT 400 (240 MG) MG PO TABS
400.0000 mg | ORAL_TABLET | Freq: Two times a day (BID) | ORAL | Status: DC
  Administered 2023-07-05 (×2): 400 mg via ORAL
  Filled 2023-07-05 (×3): qty 1

## 2023-07-05 MED ORDER — HYDRALAZINE HCL 20 MG/ML IJ SOLN: 10.0000 mg | Freq: Four times a day (QID) | INTRAMUSCULAR | Status: DC | PRN

## 2023-07-05 MED ORDER — HYDRALAZINE HCL 50 MG PO TABS: 50.0000 mg | ORAL_TABLET | Freq: Four times a day (QID) | ORAL | Status: DC | PRN

## 2023-07-05 NOTE — Plan of Care (Signed)
  Problem: Education: Goal: Knowledge of General Education information will improve Description: Including pain rating scale, medication(s)/side effects and non-pharmacologic comfort measures Outcome: Progressing   Problem: Health Behavior/Discharge Planning: Goal: Ability to manage health-related needs will improve Outcome: Progressing   Problem: Clinical Measurements: Goal: Diagnostic test results will improve Outcome: Progressing Goal: Respiratory complications will improve Outcome: Progressing Goal: Cardiovascular complication will be avoided Outcome: Progressing   Problem: Activity: Goal: Risk for activity intolerance will decrease Outcome: Progressing   Problem: Nutrition: Goal: Adequate nutrition will be maintained Outcome: Progressing   Problem: Elimination: Goal: Will not experience complications related to bowel motility Outcome: Progressing Goal: Will not experience complications related to urinary retention Outcome: Progressing

## 2023-07-05 NOTE — Progress Notes (Addendum)
[facility]  ROUNDING NOTE   Subjective:   Patient seen and evaluated during dialysis   HEMODIALYSIS FLOWSHEET:  Blood Flow Rate (mL/min): 300 mL/min Arterial Pressure (mmHg): -120.4 mmHg Venous Pressure (mmHg): 109.29 mmHg TMP (mmHg): 15.55 mmHg Ultrafiltration Rate (mL/min): 246 mL/min Dialysate Flow Rate (mL/min): 299 ml/min   Urine output 1100 ml  Objective:  Vital signs in last 24 hours:  Temp:  [98.2 F (36.8 C)-99.3 F (37.4 C)] 98.5 F (36.9 C) (09/27 0839) Pulse Rate:  [63-76] 73 (09/27 0930) Resp:  [7-22] 20 (09/27 0930) BP: (138-174)/(77-95) 171/86 (09/27 0930) SpO2:  [97 %-100 %] 100 % (09/27 0930) Weight:  [97.6 kg-100.5 kg] 97.6 kg (09/27 0828)  Weight change: 1 kg Filed Weights   07/04/23 0751 07/04/23 1050 07/05/23 0828  Weight: 100.5 kg 100.5 kg 97.6 kg    Intake/Output: I/O last 3 completed shifts: In: 3112.7 [P.O.:240; I.V.:2872.7] Out: 2300 [Urine:2300]   Intake/Output this shift:  No intake/output data recorded.  Physical Exam: General: NAD  Head: Normocephalic, atraumatic. Moist oral mucosal membranes  Eyes: Anicteric  Lungs:  Clear to auscultation normal effort  Heart: Regular rate and rhythm  Abdomen:  Soft, nontender  Extremities: trace peripheral edema.  Neurologic: Alert, moving all four extremities  Skin: No lesions.  Scrotal edema and tenderness (improving)  Access: Rt chest permcath placed on 07/02/23 by Dr Wyn Quaker    Basic Metabolic Panel: Recent Labs  Lab 06/28/23 1232 06/29/23 1610 07/01/23 9604 07/02/23 0258 07/03/23 0719 07/04/23 0309 07/05/23 0622  NA  --    < > 136 133* 137 137 140  K  --    < > 4.3 4.2 4.5 3.7 3.5  CL  --    < > 111 108 114* 109 105  CO2  --    < > 16* 13* 13* 19* 23  GLUCOSE  --    < > 100* 94 96 98 93  BUN  --    < > 72* 81* 86* 65* 46*  CREATININE  --    < > 9.46* 9.86* 10.14* 7.94* 6.31*  CALCIUM  --    < > 8.1* 8.0* 8.0* 7.9* 8.0*  MG 2.3  --   --   --   --  1.7 1.6*  PHOS   --   --   --   --   --  4.7* 4.4   < > = values in this interval not displayed.    Liver Function Tests: Recent Labs  Lab 06/28/23 1123 06/29/23 0613 06/30/23 0626 07/01/23 0634 07/02/23 0258  AST 98* 70* 77* 51* 37  ALT 76* 58* 57* 49* 44  ALKPHOS 156* 139* 157* 169* 159*  BILITOT 1.9* 1.7* 1.3* 1.2 1.1  PROT 7.9 6.8 7.0 6.5 6.7  ALBUMIN 3.3* 2.7* 2.6* 2.5* 2.5*   No results for input(s): "LIPASE", "AMYLASE" in the last 168 hours. No results for input(s): "AMMONIA" in the last 168 hours.  CBC: Recent Labs  Lab 06/29/23 0613 07/01/23 0634 07/03/23 0719 07/04/23 0309 07/05/23 0622  WBC 16.6* 12.1* 12.6* 13.2* 14.9*  HGB 8.1* 8.2* 8.0* 7.3* 7.3*  HCT 23.5* 23.8* 22.8* 20.8* 21.4*  MCV 93.6 92.6 92.3 90.8 92.6  PLT 144* 179 223 224 253    Cardiac Enzymes: Recent Labs  Lab 06/28/23 1232 06/29/23 0613  CKTOTAL 109 106    BNP: Invalid input(s): "POCBNP"  CBG: No results for input(s): "GLUCAP" in the last 168 hours.  Microbiology: Results for orders placed or  performed during the hospital encounter of 06/28/23  Blood culture (single)     Status: None   Collection Time: 06/28/23 12:22 PM   Specimen: BLOOD  Result Value Ref Range Status   Specimen Description BLOOD LEFT ANTECUBITAL  Final   Special Requests   Final    BOTTLES DRAWN AEROBIC AND ANAEROBIC Blood Culture adequate volume   Culture   Final    NO GROWTH 5 DAYS Performed at [facility], [address] [place], [place] [address]    Report Status 07/03/2023 FINAL  Final  SARS Coronavirus 2 by RT PCR (hospital order, performed in [facility] hospital lab) *cepheid single result test* Anterior Nasal Swab     Status: None   Collection Time: 06/29/23 12:15 AM   Specimen: Anterior Nasal Swab  Result Value Ref Range Status   SARS Coronavirus 2 by RT PCR NEGATIVE NEGATIVE Final    Comment: (NOTE) SARS-CoV-2 target nucleic acids are NOT DETECTED.  The SARS-CoV-2 RNA is generally detectable  in upper and lower respiratory specimens during the acute phase of infection. The lowest concentration of SARS-CoV-2 viral copies this assay can detect is 250 copies / mL. A negative result does not preclude SARS-CoV-2 infection and should not be used as the sole basis for treatment or other patient management decisions.  A negative result may occur with improper specimen collection / handling, submission of specimen other than nasopharyngeal swab, presence of viral mutation(s) within the areas targeted by this assay, and inadequate number of viral copies (<250 copies / mL). A negative result must be combined with clinical observations, patient history, and epidemiological information.  Fact Sheet for Patients:   RoadLapTop.co.za  Fact Sheet for Healthcare Providers: http://kim-miller.com/  This test is not yet approved or  cleared by the [place] FDA and has been authorized for detection and/or diagnosis of SARS-CoV-2 by FDA under an Emergency Use Authorization (EUA).  This EUA will remain in effect (meaning this test can be used) for the duration of the COVID-19 declaration under Section 564(b)(1) of the Act, 21 U.S.C. section 360bbb-3(b)(1), unless the authorization is terminated or revoked sooner.  Performed at [facility], [address] [place], [place] [address]   Chlamydia/NGC rt PCR [facility] only)     Status: None   Collection Time: 06/29/23 10:42 AM   Specimen: Urine; GU  Result Value Ref Range Status   Specimen source GC/Chlam URINE, RANDOM  Final   Chlamydia Tr NOT DETECTED NOT DETECTED Final   N gonorrhoeae NOT DETECTED NOT DETECTED Final    Comment: (NOTE) This CT/NG assay has not been evaluated in patients with a history of  hysterectomy. Performed at [facility], [address] [place], [place] [address]     Coagulation Studies: No results for input(s): "LABPROT", "INR" in the last 72  hours.   Urinalysis: No results for input(s): "COLORURINE", "LABSPEC", "PHURINE", "GLUCOSEU", "HGBUR", "BILIRUBINUR", "KETONESUR", "PROTEINUR", "UROBILINOGEN", "NITRITE", "LEUKOCYTESUR" in the last 72 hours.  Invalid input(s): "APPERANCEUR"     Imaging: MR 3D Recon At Scanner  Result Date: 07/04/2023 : CLINICAL DATA: Gallbladder wall thickening identified by prior ultrasound EXAM: MRI ABDOMEN WITHOUT CONTRAST (INCLUDING MRCP) TECHNIQUE: Multiplanar multisequence MR imaging of the abdomen was performed. Heavily T2-weighted images of the biliary and pancreatic ducts were obtained, and three-dimensional MRCP images were rendered by post processing. COMPARISON: Right upper quadrant ultrasound, 06/28/2023 FINDINGS: Lower chest: Small bilateral pleural effusions. Hepatobiliary: No solid liver abnormality is seen. Hepatic steatosis. Diffuse gallbladder wall  thickening. No gallstones or biliary dilatation. Pancreas: Unremarkable. No pancreatic ductal dilatation or surrounding inflammatory changes. Spleen: Normal in size without significant abnormality. Adrenals/Urinary Tract: Adrenal glands are unremarkable. Kidneys are normal, without renal calculi, solid lesion, or hydronephrosis. Stomach/Bowel: Stomach is within normal limits. No evidence of bowel wall thickening, distention, or inflammatory changes. Vascular/Lymphatic: No significant vascular findings are present. No enlarged abdominal lymph nodes. Other: No abdominal wall hernia. Mild anasarca. Trace perihepatic ascites. Musculoskeletal: No acute or significant osseous findings. IMPRESSION: 1. Diffuse gallbladder wall thickening. No gallstones or biliary dilatation. No focal lesion per findings of prior ultrasound. 2. Hepatic steatosis. 3. Small bilateral pleural effusions. 4. Trace perihepatic ascites and anasarca. Electronically Signed   By: Jearld Lesch M.D.   On: 07/04/2023 12:42   MR ABDOMEN MRCP WO CONTRAST  Result Date: 07/03/2023 CLINICAL  DATA:  Gallbladder wall thickening identified by prior ultrasound EXAM: MRI ABDOMEN WITHOUT CONTRAST  (INCLUDING MRCP) TECHNIQUE: Multiplanar multisequence MR imaging of the abdomen was performed. Heavily T2-weighted images of the biliary and pancreatic ducts were obtained, and three-dimensional MRCP images were rendered by post processing. COMPARISON:  Right upper quadrant ultrasound, 06/28/2023 FINDINGS: Lower chest: Small bilateral pleural effusions. Hepatobiliary: No solid liver abnormality is seen. Hepatic steatosis. Diffuse gallbladder wall thickening. No gallstones or biliary dilatation. Pancreas: Unremarkable. No pancreatic ductal dilatation or surrounding inflammatory changes. Spleen: Normal in size without significant abnormality. Adrenals/Urinary Tract: Adrenal glands are unremarkable. Kidneys are normal, without renal calculi, solid lesion, or hydronephrosis. Stomach/Bowel: Stomach is within normal limits. No evidence of bowel wall thickening, distention, or inflammatory changes. Vascular/Lymphatic: No significant vascular findings are present. No enlarged abdominal lymph nodes. Other: No abdominal wall hernia. Mild anasarca. Trace perihepatic ascites. Musculoskeletal: No acute or significant osseous findings. IMPRESSION: 1. Diffuse gallbladder wall thickening. No gallstones or biliary dilatation. No focal lesion per findings of prior ultrasound. 2. Hepatic steatosis. 3. Small bilateral pleural effusions. 4. Trace perihepatic ascites and anasarca. Electronically Signed   By: Jearld Lesch M.D.   On: 07/03/2023 18:47     Medications:    sodium chloride Stopped (07/04/23 0729)    amLODipine  10 mg Oral QPM   aspirin EC  81 mg Oral Daily   carvedilol  25 mg Oral BID WC   Chlorhexidine Gluconate Cloth  6 each Topical Q0600   clopidogrel  75 mg Oral Daily   doxycycline  100 mg Oral Q12H   folic acid  1 mg Oral Daily   heparin  5,000 Units Subcutaneous Q8H   lactulose  20 g Oral Once    levETIRAcetam  500 mg Oral BID   nystatin   Topical BID   pantoprazole  40 mg Oral Daily   sodium bicarbonate  650 mg Oral TID   thiamine  100 mg Oral Daily   Vitamin D (Ergocalciferol)  50,000 Units Oral Q7 days   alteplase, alteplase, fentaNYL (SUBLIMAZE) injection, guaiFENesin-dextromethorphan, heparin, heparin, LORazepam, melatonin, nicotine, ondansetron (ZOFRAN) IV, senna-docusate  Assessment/ Plan:  Mr. Ogle Hoeffner is a 52 y.o.  male with medical problems of hypertension, alcohol abuse, hyperlipidemia, history of TIA, GERD, erectile dysfunction, tobacco abuse, history of seizures  was admitted on 06/28/2023 for Shortness of breath [R06.02] SOB (shortness of breath) [R06.02] AKI (acute kidney injury) (HCC) [N17.9] Anemia, unspecified type [D64.9]   Acute kidney injury on chronic kidney disease stage IIIa. AKI secondary to severe ATN with chronic NSAID use HTN Proteinuria. Elevated liver enzymes Hypoalbuminemia Fatty liver Alcohol abuse Current smoker Scrotal Abscess  Lab Results  Component Value Date   CREATININE 6.31 (H) 07/05/2023   CREATININE 7.94 (H) 07/04/2023   CREATININE 10.14 (H) 07/03/2023    Intake/Output Summary (Last 24 hours) at 07/05/2023 0945 Last data filed at 07/05/2023 0700 Gross per 24 hour  Intake 369.69 ml  Output 1100 ml  Net -730.31 ml   Baseline creatinine of 1.61, GFR 51 from 06/13/2023.  Presenting creatinine of 4.18, GFR 16.  In addition patient has elevated liver enzymes suggesting hepatitis possibly alcoholic hepatitis.  Albumin is low.  Diffuse echogenic liver consistent with fatty infiltration. Urinalysis with moderate blood, 0-5 RBCs, 0-5 WBCs, 100 mg of protein. Renal ultrasound is negative for obstruction, mass.   Differential diagnosis includes nonsteroidals induced AKI, FSGS, glomerulonephritis, infection   Protein creatinine ratio 2.23, CK 106, UDS negative   Plan: Appreciate vascular surgery placing right chest PermCath on  07/02/2023 Patient receiving third treatment today, will hold further treatments and monitor for renal recovery.  No UF due to adequate urine output.  Renal navigator will begin outpatient clinic search. Urology consult -low suspicion of epididymitis, continue oral antibiotics.  Scrotal ultrasound negative for fluid collection Remains on doxycycline twice daily, symptoms improving.     LOS: 7 Shantelle Breeze 9/27/20249:45 AM   Patient was seen and examined with Wendee Beavers, NP.  I personally formulated plan of care for the problems addressed and discussed with NP.  I agree with the note as documented except as noted below. I take the responsibility for the inherent risk of patient management.  Monitor for renal recovery. Patient requesting d/c over the weekend. If d/c, can follow up outpatient in office.

## 2023-07-05 NOTE — Progress Notes (Signed)
[facility] Progress Note  Patient: Gary [name]    ZOX:[id number]  DOA: 06/28/2023     Date of Service: the patient was seen and examined on 07/05/2023  Chief Complaint  Patient presents with   Shortness of Breath   Brief hospital course: [name] is a 52 year old male with history of alcohol abuse, hypertension, hyperlipidemia, history of TIA, GERD, erectile dysfunction, tobacco use, history of seizure, who presents to the emergency department for chief concerns of shortness of breath and dizziness. 09/20: in ED, VSS, mild hyponatremia sodium 132, significantly elevated creatinine 4.18 w/ GFR 16, elevated LFT  AST 98 and ALT 76, Bili 1.9, ALP 156, BNP WNL, Troponin and CK WNL, WBC elevated 17.5, Hgb 9.1. CXR nonacute. Renal US normal no dilatation, Abd [place] (+)fatty liver, GB w/ diffuse wall thickening and focal nodular area measuring up to 19 by 7 x 5 mm concern for underlying lesion and advise compare w/ MRI. Given Protonix 80 mg IV one-time dose, sodium chloride 1 L bolus. Admitted to hospitalist service.  09/21: SOB improved, renal function worse Cr to 5.88, consulted to nephrology. Pt c/o scrotal pain, [place] (+)small bl hydrocele L varicocele L epididymal cyst - started tx for epididymitis given leukocytosis  09/22: early AM, scrotum apparently opened to drain - pt describes clear thin liquid, bandage appears serous fluid, no purulent, no erythema to skin - monitor. Cr worse, up to 8, nephrology following, continuing IV fluids.  09/23: Cr still worsening and may need to consider dialysis tomorrow, urology consult repeating US - no abscess 09/24: permcath placement and dialysis after that   Consultants:  Nephrology  Vascular surgery Urology    Procedures: none     Assessment and Plan: AKI (acute kidney injury) on CKD3a - worsening --> acute renal faillure  suspect multifactorial including prerenal in setting of poor p.o./fluid intake, NSAID use,  glomerulonephritis,  rhabdomyolysis, infection related AKI from epididymitis.  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 Monitor H&H Iron profile, and folate within normal range, B12 level elevated Patient may benefit from Epogen by nephrology   Shortness of breath without respiratory failure - resolved  Seems likely to be respiratory compensation for metabolic acidosis d/t renal failure  Differentials would include COPD history of tobacco use  CXR personally reviewed, no concerns  Neg troponin, ACS unlikely     Gallbladder wall thickening, ruled out via MRCP  [place] RUQ mass - nodular 19 mm x 7 mm x 5 mm  Associated w/ elevation in Bili, AST, ALT, ALP On 9/25 d/w general surgery, recommended MRCP without contrast and it did show gallbladder wall thickening.  Patient was seen by general surgery, asymptomatic, recommended no intervention.  Pt was seen by Dr. Tonna Boehringer   Elevated liver enzymes Suspect secondary to gallbladder disease as above, complicated by alcohol abuse  LFT trending down Hepatitis panel neg    History Seizures Calculated creatinine clearance is 13 mL/min on admission Patient on Keppra 1000 mg p.o. twice daily at home. Placed on Keppra reduced dosing to 500 mg p.o. twice daily in setting of acute kidney injury   Scrotal pain w/Small bilateral hydrocele Left-sided varicocele, 8 mm left epididymal cyst. Leukocytosis  Ceftriaxone x1 + doxycycline 100 mg bid x10 days (FQ would cover enteric pathogens but pt not sexually active for some time, FQ renal dosing problematic, will give doxy) started 09/21 Check for GC/CT in urine --> neg  Frequent dressing changes Urology following    Hyponatremia -resolved Likely d/t renal failure  Follow BMP   Metabolic acidosis, resolved Mild, secondary to acute kidney injury Sodium bicarbonate 325 mg p.o. twice daily, 2 doses ordered 9/25-sodium bicarbonate 650 mg p.o. 3 times daily order placed 9/27 decreased sodium bicarb 650 p.o. twice daily  History CVA Statin held for now given elevation LFT  Plavix    HLD  04/25/2023, cardiologist discontinued Pravachol and started patient on Crestor 40 mg w/ improvement in cholesterol and AST/ALT.  Hold statin for now w/ elevated LFT    Essential HTN On home amlodipine, carvedilol  Use hydralazine as needed Monitor BP and titrate medications accordingly  GERD  PPI   Insomnia Melatonin 5 mg nightly as needed for sleep   Tobacco abuse As needed nicotine patch ordered   Vitamin D Insufficiency: started vitamin D 50,000 units p.o. weekly, follow with PCP to repeat vitamin D level after 3 to 6 months.  Body mass index is 30.87 kg/m.  Interventions:  Diet: Renal diet DVT Prophylaxis: Subcutaneous Heparin    Advance goals of care discussion: Full code  Family Communication: family was not present at bedside, at the time of interview.  The pt provided permission to discuss medical plan with the family. Opportunity was given to ask question and all questions were answered satisfactorily.   Disposition:  Pt is from Home, admitted with AKI, developed ESRD, started new hemodialysis, scrotal infection on antibiotics, gallbladder lesion, pending MRI and general surgery eval, which precludes a safe discharge. Discharge to Home, when stable, may need 1-2 more days to stay in the hospital.  Subjective: No significant events overnight, patient was seen during hemodialysis, still has some drainage coming from testicular abscess area, denied any pain.  No chest pain or pressure, no shortness of breath.    Physical Exam: General: NAD, lying comfortably Appear in no distress, affect  appropriate Eyes: PERRLA ENT: Oral Mucosa Clear, moist  Neck: no JVD,  Cardiovascular: S1 and S2 Present, no Murmur,  Respiratory: good respiratory effort, Bilateral Air entry equal and Decreased, no Crackles, no wheezes Abdomen: Bowel Sound present, Soft and no tenderness, right testicular swelling, mild erythema and drainage Skin: no rashes Extremities: no Pedal edema, no calf tenderness Neurologic: without any new focal findings Gait not checked due to patient safety concerns  Vitals:   07/05/23 1100 07/05/23 1130 07/05/23 1200 07/05/23 1218  BP: (!) 160/94 (!) 175/83 (!) 171/89 (!) 170/87  Pulse: 76  72 73  Resp: (!) 21 (!) 22 (!) 23 20  Temp:    98.6 F (37 C)  TempSrc:    Oral  SpO2: 99% 99% 99% 100%  Weight:      Height:        Intake/Output Summary (Last 24 hours) at 07/05/2023 1227 Last data filed at 07/05/2023 0700 Gross per 24 hour  Intake 369.69 ml  Output 700 ml  Net -330.31 ml   Filed Weights   07/04/23 0751 07/04/23 1050 07/05/23 0828  Weight: 100.5 kg 100.5 kg 97.6 kg    Data Reviewed: I have personally reviewed and interpreted daily labs, tele strips, imagings as discussed above. I reviewed all nursing notes, pharmacy notes, vitals, pertinent old records I have discussed plan of care  as described above with RN and patient/family.  CBC: Recent Labs  Lab 06/29/23 0613 07/01/23 0634 07/03/23 0719 07/04/23 0309 07/05/23 0622  WBC 16.6* 12.1* 12.6* 13.2* 14.9*  HGB 8.1* 8.2* 8.0* 7.3* 7.3*  HCT 23.5* 23.8* 22.8* 20.8* 21.4*  MCV 93.6 92.6 92.3 90.8 92.6  PLT 144* 179 223 224 253   Basic Metabolic Panel: Recent Labs  Lab 06/28/23 1232 06/29/23 0613 07/01/23 0634 07/02/23 0258 07/03/23 0719 07/04/23 0309 07/05/23 0622  NA  --    < > 136 133* 137 137 140  K  --    < > 4.3 4.2 4.5 3.7 3.5  CL  --    < > 111 108 114* 109 105  CO2  --    < > 16* 13* 13* 19* 23  GLUCOSE  --    < > 100* 94 96 98 93  BUN  --    < > 72* 81* 86* 65* 46*   CREATININE  --    < > 9.46* 9.86* 10.14* 7.94* 6.31*  CALCIUM  --    < > 8.1* 8.0* 8.0* 7.9* 8.0*  MG 2.3  --   --   --   --  1.7 1.6*  PHOS  --   --   --   --   --  4.7* 4.4   < > = values in this interval not displayed.    Studies: No results found.  Scheduled Meds:  amLODipine  10 mg Oral QPM   aspirin EC  81 mg Oral Daily   carvedilol  25 mg Oral BID WC   Chlorhexidine Gluconate Cloth  6 each Topical Q0600   clopidogrel  75 mg Oral Daily   doxycycline  100 mg Oral Q12H   folic acid  1 mg Oral Daily   heparin  5,000 Units Subcutaneous Q8H   lactulose  20 g Oral Once   levETIRAcetam  500 mg Oral BID   nystatin   Topical BID   pantoprazole  40 mg Oral Daily   sodium bicarbonate  650 mg Oral TID   thiamine  100 mg Oral Daily   Vitamin D (Ergocalciferol)  50,000 Units Oral Q7 days   Continuous Infusions:  sodium chloride Stopped (07/04/23 0729)   PRN Meds: alteplase, alteplase, fentaNYL (SUBLIMAZE) injection, guaiFENesin-dextromethorphan, heparin, heparin, LORazepam, melatonin, nicotine, ondansetron (ZOFRAN) IV, senna-docusate  Time spent: 35 minutes  Author: Gillis Santa. MD [facility] 07/05/2023 12:27 PM  To reach On-call, see care teams to locate the attending and reach out to them via [email or web address]. If 7PM-7AM, please contact night-coverage If you still have difficulty reaching the attending provider, please page the [facility] (Director on Call) for [facility] on amion for assistance.

## 2023-07-05 NOTE — Progress Notes (Signed)
  Received patient in bed to unit.    Informed consent signed and in chart.    TX duration: 2.25     Transported back to floor  Hand-off given to patient's nurse.    Access used: R jugular  Access issues: none   Total UF removed: 0 Medication(s) given: none Post HD VS: 170/87 Post HD weight: 97.6   Not able to return blood due to dialyzer clotting   Olin Pia, LPN [facility]

## 2023-07-06 ENCOUNTER — Inpatient Hospital Stay: Payer: Medicaid Other

## 2023-07-06 LAB — CBC
HCT: 20.7 % — ABNORMAL LOW (ref 39.0–52.0)
Hemoglobin: 7.1 g/dL — ABNORMAL LOW (ref 13.0–17.0)
MCH: 32 pg (ref 26.0–34.0)
MCHC: 34.3 g/dL (ref 30.0–36.0)
MCV: 93.2 fL (ref 80.0–100.0)
Platelets: 241 10*3/uL (ref 150–400)
RBC: 2.22 MIL/uL — ABNORMAL LOW (ref 4.22–5.81)
RDW: 12.7 % (ref 11.5–15.5)
WBC: 15.3 10*3/uL — ABNORMAL HIGH (ref 4.0–10.5)
nRBC: 0 % (ref 0.0–0.2)

## 2023-07-06 LAB — BASIC METABOLIC PANEL
Anion gap: 13 (ref 5–15)
BUN: 31 mg/dL — ABNORMAL HIGH (ref 6–20)
CO2: 26 mmol/L (ref 22–32)
Calcium: 8.1 mg/dL — ABNORMAL LOW (ref 8.9–10.3)
Chloride: 98 mmol/L (ref 98–111)
Creatinine, Ser: 4.84 mg/dL — ABNORMAL HIGH (ref 0.61–1.24)
GFR, Estimated: 14 mL/min — ABNORMAL LOW (ref >60)
Glucose, Bld: 78 mg/dL (ref 70–99)
Potassium: 3.4 mmol/L — ABNORMAL LOW (ref 3.5–5.1)
Sodium: 137 mmol/L (ref 135–145)

## 2023-07-06 LAB — PROCALCITONIN: Procalcitonin: 0.75 ng/mL

## 2023-07-06 LAB — PHOSPHORUS: Phosphorus: 4.1 mg/dL (ref 2.5–4.6)

## 2023-07-06 LAB — MAGNESIUM: Magnesium: 1.6 mg/dL — ABNORMAL LOW (ref 1.7–2.4)

## 2023-07-06 MED ORDER — MAGNESIUM SULFATE 2 GM/50ML IV SOLN
2.0000 g | Freq: Once | INTRAVENOUS | Status: AC
  Administered 2023-07-06: 2 g via INTRAVENOUS
  Filled 2023-07-06: qty 50

## 2023-07-06 MED ORDER — AMOXICILLIN-POT CLAVULANATE 500-125 MG PO TABS
1.0000 | ORAL_TABLET | Freq: Every day | ORAL | Status: DC
  Administered 2023-07-06 – 2023-07-09 (×4): 1 via ORAL
  Filled 2023-07-06 (×5): qty 1

## 2023-07-06 MED ORDER — LEVETIRACETAM 500 MG PO TABS
500.0000 mg | ORAL_TABLET | ORAL | Status: DC
  Administered 2023-07-08: 500 mg via ORAL
  Filled 2023-07-06 (×2): qty 1

## 2023-07-06 MED ORDER — MAGNESIUM OXIDE -MG SUPPLEMENT 400 (240 MG) MG PO TABS
400.0000 mg | ORAL_TABLET | Freq: Two times a day (BID) | ORAL | Status: AC
  Administered 2023-07-07 – 2023-07-08 (×3): 400 mg via ORAL
  Filled 2023-07-06 (×3): qty 1

## 2023-07-06 NOTE — Progress Notes (Signed)
[facility] Progress Note  Patient: Gary [name]    BMW:[id number]  DOA: 06/28/2023     Date of Service: the patient was seen and examined on 07/06/2023  Chief Complaint  Patient presents with   Shortness of Breath   Brief hospital course: Gary [name] is a 52 year old male with history of alcohol abuse, hypertension, hyperlipidemia, history of TIA, GERD, erectile dysfunction, tobacco use, history of seizure, who presents to the emergency department for chief concerns of shortness of breath and dizziness. 09/20: in ED, VSS, mild hyponatremia sodium 132, significantly elevated creatinine 4.18 w/ GFR 16, elevated LFT  AST 98 and ALT 76, Bili 1.9, ALP 156, BNP WNL, Troponin and CK WNL, WBC elevated 17.5, Hgb 9.1. CXR nonacute. Renal US normal no dilatation, Abd [place] (+)fatty liver, GB w/ diffuse wall thickening and focal nodular area measuring up to 19 by 7 x 5 mm concern for underlying lesion and advise compare w/ MRI. Given Protonix 80 mg IV one-time dose, sodium chloride 1 L bolus. Admitted to hospitalist service.  09/21: SOB improved, renal function worse Cr to 5.88, consulted to nephrology. Pt c/o scrotal pain, [place] (+)small bl hydrocele L varicocele L epididymal cyst - started tx for epididymitis given leukocytosis  09/22: early AM, scrotum apparently opened to drain - pt describes clear thin liquid, bandage appears serous fluid, no purulent, no erythema to skin - monitor. Cr worse, up to 8, nephrology following, continuing IV fluids.  09/23: Cr still worsening and may need to consider dialysis tomorrow, urology consult repeating US - no abscess 09/24: permcath placement and dialysis after that   Consultants:  Nephrology  Vascular surgery Urology    Procedures: none     Assessment and Plan: AKI (acute kidney injury) on CKD3a - worsening --> acute renal faillure  suspect multifactorial including prerenal in setting of poor p.o./fluid intake, NSAID use,  glomerulonephritis,  rhabdomyolysis, infection related AKI from epididymitis.  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 Monitor H&H Iron profile, and folate within normal range, B12 level elevated Patient may benefit from Epogen by nephrology   Shortness of breath without respiratory failure - resolved  Seems likely to be respiratory compensation for metabolic acidosis d/t renal failure  Differentials would include COPD history of tobacco use  CXR personally reviewed, no concerns  Neg troponin, ACS unlikely     Gallbladder wall thickening, ruled out via MRCP  [place] RUQ mass - nodular 19 mm x 7 mm x 5 mm  Associated w/ elevation in Bili, AST, ALT, ALP On 9/25 d/w general surgery, recommended MRCP without contrast and it did show gallbladder wall thickening.  Patient was seen by general surgery, asymptomatic, recommended no intervention.  Pt was seen by Dr. Tonna Boehringer   Elevated liver enzymes Suspect secondary to gallbladder disease as above, complicated by alcohol abuse  LFT trending down Hepatitis panel neg    History Seizures Calculated creatinine clearance is 13 mL/min on admission Patient on Keppra 1000 mg p.o. twice daily at home. Placed on Keppra reduced dosing to 500 mg p.o. twice daily in setting of acute kidney injury 9/28 added Keppra 500 mg p.o. 3 times a week to supplement after hemodialysis  Scrotal pain w/Small bilateral hydrocele Left-sided varicocele, 8 mm left epididymal cyst. Leukocytosis Ceftriaxone x1 + doxycycline 100 mg bid x10 days (FQ would cover enteric pathogens but pt not sexually active for some time, FQ renal dosing problematic, will give doxy) started 09/21 Check for  GC/CT in urine --> neg  Frequent dressing changes Urology following 9/28 elevated WBC count, added Augmentin 500 mg p.o. daily renal dose Repeat ultrasound scrotum to rule out abscess/fluid collection    Hyponatremia -resolved Likely d/t renal failure  Follow BMP   Metabolic acidosis, resolved Mild, secondary to acute kidney injury Sodium bicarbonate 325 mg p.o. twice daily, 2 doses ordered 9/25-sodium bicarbonate 650 mg p.o. 3 times daily order placed 9/27 decreased sodium bicarb 650 p.o. twice daily, DC'd bicarb on 9/28  History CVA Statin held for now given elevation LFT  Continued Plavix    HLD  04/25/2023, cardiologist discontinued Pravachol and started patient on Crestor 40 mg w/ improvement in cholesterol and AST/ALT.  Hold statin for now w/ elevated LFT    Essential HTN On home amlodipine, carvedilol  Use hydralazine as needed Monitor BP and titrate medications accordingly  GERD  PPI   Insomnia Melatonin 5 mg nightly as needed for sleep   Tobacco abuse As needed nicotine patch ordered   Vitamin D Insufficiency: started vitamin D 50,000 units p.o. weekly, follow with PCP to repeat vitamin D level after 3 to 6 months.  Body mass index is 30.87 kg/m.  Interventions:  Diet: Renal diet DVT Prophylaxis: Subcutaneous Heparin    Advance goals of care discussion: Full code  Family Communication: family was not present at bedside, at the time of interview.  The pt provided permission to discuss medical plan with the family. Opportunity was given to ask question and all questions were answered satisfactorily.   Disposition:  Pt is from Home, admitted with AKI, developed ESRD, started new hemodialysis, scrotal infection on antibiotics, which precludes a safe discharge. Discharge to Home, when stable, may need 1-2 more days to stay in the hospital.  Subjective: No significant events overnight, patient was lying calmly in the bed, denied any complaints, no more  drainage from the scrotal site, patient is using nystatin powder.  Patient would like to go home but has no insurance and no set up after dialysis as an outpatient yet. Still has elevated WBC count, added antibiotics, repeat ultrasound scrotum today plan for discharge tomorrow a.m. if patient will have set up as an outpatient Discussed with nephrology.    Physical Exam: General: NAD, lying comfortably Appear in no distress, affect appropriate Eyes: PERRLA ENT: Oral Mucosa Clear, moist  Neck: no JVD,  Cardiovascular: S1 and S2 Present, no Murmur,  Respiratory: good respiratory effort, Bilateral Air entry equal and Decreased, no Crackles, no wheezes Abdomen: Bowel Sound present, Soft and no tenderness, right testicular swelling, mild erythema and drainage Skin: no rashes Extremities: no Pedal edema, no calf tenderness Neurologic: without any new focal findings Gait not checked due to patient safety concerns  Vitals:   07/05/23 1711 07/05/23 1712 07/06/23 0017 07/06/23 0730  BP: 127/77 127/77 137/81 (!) 146/83  Pulse:  76 70 66  Resp:  18 18 18   Temp:  99.2 F (37.3 C) 98 F (36.7 C) 98.5 F (36.9 C)  TempSrc:   Oral   SpO2:  99% 99% 96%  Weight:      Height:        Intake/Output Summary (Last 24 hours) at 07/06/2023 1137 Last data filed  at 07/06/2023 0981 Gross per 24 hour  Intake --  Output 1325 ml  Net -1325 ml   Filed Weights   07/04/23 1050 07/05/23 0828 07/05/23 1229  Weight: 100.5 kg 97.6 kg 97.6 kg    Data Reviewed: I have personally reviewed and interpreted daily labs, tele strips, imagings as discussed above. I reviewed all nursing notes, pharmacy notes, vitals, pertinent old records I have discussed plan of care as described above with RN and patient/family.  CBC: Recent Labs  Lab 07/01/23 0634 07/03/23 0719 07/04/23 0309 07/05/23 0622 07/06/23 0335  WBC 12.1* 12.6* 13.2* 14.9* 15.3*  HGB 8.2* 8.0* 7.3* 7.3* 7.1*  HCT 23.8* 22.8* 20.8* 21.4* 20.7*   MCV 92.6 92.3 90.8 92.6 93.2  PLT 179 223 224 253 241   Basic Metabolic Panel: Recent Labs  Lab 07/02/23 0258 07/03/23 0719 07/04/23 0309 07/05/23 0622 07/06/23 0335  NA 133* 137 137 140 137  K 4.2 4.5 3.7 3.5 3.4*  CL 108 114* 109 105 98  CO2 13* 13* 19* 23 26  GLUCOSE 94 96 98 93 78  BUN 81* 86* 65* 46* 31*  CREATININE 9.86* 10.14* 7.94* 6.31* 4.84*  CALCIUM 8.0* 8.0* 7.9* 8.0* 8.1*  MG  --   --  1.7 1.6* 1.6*  PHOS  --   --  4.7* 4.4 4.1    Studies: No results found.  Scheduled Meds:  amLODipine  10 mg Oral QPM   amoxicillin-clavulanate  1 tablet Oral Daily   aspirin EC  81 mg Oral Daily   carvedilol  25 mg Oral BID WC   Chlorhexidine Gluconate Cloth  6 each Topical Q0600   clopidogrel  75 mg Oral Daily   doxycycline  100 mg Oral Q12H   folic acid  1 mg Oral Daily   heparin  5,000 Units Subcutaneous Q8H   lactulose  20 g Oral Once   levETIRAcetam  500 mg Oral BID   [START ON 07/07/2023] magnesium oxide  400 mg Oral BID   nystatin   Topical BID   pantoprazole  40 mg Oral Daily   thiamine  100 mg Oral Daily   Vitamin D (Ergocalciferol)  50,000 Units Oral Q7 days   Continuous Infusions:   PRN Meds: acetaminophen, fentaNYL (SUBLIMAZE) injection, guaiFENesin-dextromethorphan, hydrALAZINE **OR** hydrALAZINE, LORazepam, melatonin, nicotine, ondansetron (ZOFRAN) IV, senna-docusate  Time spent: 35 minutes  Author: Gillis Santa. MD [facility] 07/06/2023 11:37 AM  To reach On-call, see care teams to locate the attending and reach out to them via [email or web address]. If 7PM-7AM, please contact night-coverage If you still have difficulty reaching the attending provider, please page the [facility] (Director on Call) for [facility] on amion for assistance.

## 2023-07-06 NOTE — Plan of Care (Signed)
Patient has remained safe and free of injury or falls. Patient denies pain or discomfort. Plan is for the patient to possibly get set up with outpatient HD. Patient anxious for discharge, CM is working on the discharge plan. SN reinforced plan for discharge when he is stable. Labs are improving. SN will continue to monitor the plan of care.

## 2023-07-06 NOTE — Progress Notes (Signed)
[facility]  ROUNDING NOTE   Subjective:   Patient underwent hemodialysis treatment yesterday. Tolerated well. Urine output 1.3 L over the preceding 24 hours. Patient questions as to when he can go home.  Objective:  Vital signs in last 24 hours:  Temp:  [98 F (36.7 C)-99.2 F (37.3 C)] 98.5 F (36.9 C) (09/28 0730) Pulse Rate:  [66-76] 66 (09/28 0730) Resp:  [18] 18 (09/28 0730) BP: (127-146)/(77-83) 146/83 (09/28 0730) SpO2:  [96 %-99 %] 96 % (09/28 0730)  Weight change: -2.9 kg Filed Weights   07/04/23 1050 07/05/23 0828 07/05/23 1229  Weight: 100.5 kg 97.6 kg 97.6 kg    Intake/Output: I/O last 3 completed shifts: In: 129.7 [I.V.:129.7] Out: 2025 [Urine:2025]   Intake/Output this shift:  No intake/output data recorded.  Physical Exam: General: NAD  Head: Normocephalic, atraumatic. Moist oral mucosal membranes  Eyes: Anicteric  Lungs:  Clear to auscultation normal effort  Heart: Regular rate and rhythm  Abdomen:  Soft, nontender  Extremities: trace peripheral edema.  Neurologic: Alert, moving all four extremities  Skin: No lesions.  Scrotal edema and tenderness (improving)  Access: Rt chest permcath placed on 07/02/23 by Dr Wyn Quaker    Basic Metabolic Panel: Recent Labs  Lab 07/02/23 0258 07/03/23 0719 07/04/23 0309 07/05/23 0622 07/06/23 0335  NA 133* 137 137 140 137  K 4.2 4.5 3.7 3.5 3.4*  CL 108 114* 109 105 98  CO2 13* 13* 19* 23 26  GLUCOSE 94 96 98 93 78  BUN 81* 86* 65* 46* 31*  CREATININE 9.86* 10.14* 7.94* 6.31* 4.84*  CALCIUM 8.0* 8.0* 7.9* 8.0* 8.1*  MG  --   --  1.7 1.6* 1.6*  PHOS  --   --  4.7* 4.4 4.1    Liver Function Tests: Recent Labs  Lab 06/30/23 0626 07/01/23 0634 07/02/23 0258  AST 77* 51* 37  ALT 57* 49* 44  ALKPHOS 157* 169* 159*  BILITOT 1.3* 1.2 1.1  PROT 7.0 6.5 6.7  ALBUMIN 2.6* 2.5* 2.5*   No results for input(s): "LIPASE", "AMYLASE" in the last 168 hours. No results for input(s): "AMMONIA" in  the last 168 hours.  CBC: Recent Labs  Lab 07/01/23 0634 07/03/23 0719 07/04/23 0309 07/05/23 0622 07/06/23 0335  WBC 12.1* 12.6* 13.2* 14.9* 15.3*  HGB 8.2* 8.0* 7.3* 7.3* 7.1*  HCT 23.8* 22.8* 20.8* 21.4* 20.7*  MCV 92.6 92.3 90.8 92.6 93.2  PLT 179 223 224 253 241    Cardiac Enzymes: No results for input(s): "CKTOTAL", "CKMB", "CKMBINDEX", "TROPONINI" in the last 168 hours.   BNP: Invalid input(s): "POCBNP"  CBG: No results for input(s): "GLUCAP" in the last 168 hours.  Microbiology: Results for orders placed or performed during the hospital encounter of 06/28/23  Blood culture (single)     Status: None   Collection Time: 06/28/23 12:22 PM   Specimen: BLOOD  Result Value Ref Range Status   Specimen Description BLOOD LEFT ANTECUBITAL  Final   Special Requests   Final    BOTTLES DRAWN AEROBIC AND ANAEROBIC Blood Culture adequate volume   Culture   Final    NO GROWTH 5 DAYS Performed at [facility], [address], [place], [place] [address]    Report Status 07/03/2023 FINAL  Final  SARS Coronavirus 2 by RT PCR (hospital order, performed in [facility] hospital lab) *cepheid single result test* Anterior Nasal Swab     Status: None   Collection Time: 06/29/23 12:15 AM   Specimen:  Anterior Nasal Swab  Result Value Ref Range Status   SARS Coronavirus 2 by RT PCR NEGATIVE NEGATIVE Final    Comment: (NOTE) SARS-CoV-2 target nucleic acids are NOT DETECTED.  The SARS-CoV-2 RNA is generally detectable in upper and lower respiratory specimens during the acute phase of infection. The lowest concentration of SARS-CoV-2 viral copies this assay can detect is 250 copies / mL. A negative result does not preclude SARS-CoV-2 infection and should not be used as the sole basis for treatment or other patient management decisions.  A negative result may occur with improper specimen collection / handling, submission of specimen other than nasopharyngeal swab, presence of  viral mutation(s) within the areas targeted by this assay, and inadequate number of viral copies (<250 copies / mL). A negative result must be combined with clinical observations, patient history, and epidemiological information.  Fact Sheet for Patients:   RoadLapTop.co.za  Fact Sheet for Healthcare Providers: http://kim-miller.com/  This test is not yet approved or  cleared by the [place] FDA and has been authorized for detection and/or diagnosis of SARS-CoV-2 by FDA under an Emergency Use Authorization (EUA).  This EUA will remain in effect (meaning this test can be used) for the duration of the COVID-19 declaration under Section 564(b)(1) of the Act, 21 U.S.C. section 360bbb-3(b)(1), unless the authorization is terminated or revoked sooner.  Performed at [facility], [address] [place], [place] [address]   Chlamydia/NGC rt PCR [facility] only)     Status: None   Collection Time: 06/29/23 10:42 AM   Specimen: Urine; GU  Result Value Ref Range Status   Specimen source GC/Chlam URINE, RANDOM  Final   Chlamydia Tr NOT DETECTED NOT DETECTED Final   N gonorrhoeae NOT DETECTED NOT DETECTED Final    Comment: (NOTE) This CT/NG assay has not been evaluated in patients with a history of  hysterectomy. Performed at [facility], [address] [place], [place] [address]     Coagulation Studies: No results for input(s): "LABPROT", "INR" in the last 72 hours.   Urinalysis: No results for input(s): "COLORURINE", "LABSPEC", "PHURINE", "GLUCOSEU", "HGBUR", "BILIRUBINUR", "KETONESUR", "PROTEINUR", "UROBILINOGEN", "NITRITE", "LEUKOCYTESUR" in the last 72 hours.  Invalid input(s): "APPERANCEUR"     Imaging: No results found.   Medications:      amLODipine  10 mg Oral QPM   amoxicillin-clavulanate  1 tablet Oral Daily   aspirin EC  81 mg Oral Daily   carvedilol  25 mg Oral BID WC   Chlorhexidine  Gluconate Cloth  6 each Topical Q0600   clopidogrel  75 mg Oral Daily   doxycycline  100 mg Oral Q12H   folic acid  1 mg Oral Daily   heparin  5,000 Units Subcutaneous Q8H   lactulose  20 g Oral Once   levETIRAcetam  500 mg Oral BID   [START ON 07/08/2023] levETIRAcetam  500 mg Oral Once per day on Monday Wednesday Friday   [START ON 07/07/2023] magnesium oxide  400 mg Oral BID   nystatin   Topical BID   pantoprazole  40 mg Oral Daily   thiamine  100 mg Oral Daily   Vitamin D (Ergocalciferol)  50,000 Units Oral Q7 days   acetaminophen, fentaNYL (SUBLIMAZE) injection, guaiFENesin-dextromethorphan, hydrALAZINE **OR** hydrALAZINE, LORazepam, melatonin, nicotine, ondansetron (ZOFRAN) IV, senna-docusate  Assessment/ Plan:  [name] is a 52 y.o.  male with medical problems of hypertension, alcohol abuse, hyperlipidemia, history of TIA, GERD, erectile dysfunction, tobacco abuse, history of seizures  was admitted on 06/28/2023 for Shortness of breath [R06.02] SOB (shortness of breath) [R06.02] AKI (acute kidney injury) (HCC) [N17.9] Anemia, unspecified type [D64.9]   Acute kidney injury on chronic kidney disease stage IIIa/proteinuria. AKI secondary to severe ATN with chronic NSAID use HTN Proteinuria. Elevated liver enzymes Hypoalbuminemia Fatty liver Alcohol abuse Current smoker Scrotal Abscess  Lab Results  Component Value Date   CREATININE 4.84 (H) 07/06/2023   CREATININE 6.31 (H) 07/05/2023   CREATININE 7.94 (H) 07/04/2023    Intake/Output Summary (Last 24 hours) at 07/06/2023 1343 Last data filed at 07/06/2023 8119 Gross per 24 hour  Intake --  Output 900 ml  Net -900 ml   Baseline creatinine of 1.61, GFR 51 from 06/13/2023.  Presenting creatinine of 4.18, GFR 16.  In addition patient has elevated liver enzymes suggesting hepatitis possibly alcoholic hepatitis.  Albumin is low.  Diffuse echogenic liver consistent with fatty infiltration. Urinalysis with moderate  blood, 0-5 RBCs, 0-5 WBCs, 100 mg of protein. Renal ultrasound is negative for obstruction, mass.   Differential diagnosis includes nonsteroidals induced AKI, FSGS, glomerulonephritis, infection   Protein creatinine ratio 2.23, CK 106, UDS negative Negative ANA/ANCA   Plan: Creatinine currently 4.8 with urine output of 1.3 L.  Serologic workup reviewed thus far is negative.  Ibuprofen may be playing some role though patient has significant proteinuria.  Patient did have elevated white count.  Discussed with hospitalist.  We agreed to reevaluate his condition tomorrow as the patient is desiring discharge.  Continue to monitor renal parameters closely.    LOS: 8 Camaryn Lumbert 9/28/20241:43 PM

## 2023-07-07 LAB — HEPATIC FUNCTION PANEL
ALT: 23 U/L (ref 0–44)
AST: 29 U/L (ref 15–41)
Albumin: 2.9 g/dL — ABNORMAL LOW (ref 3.5–5.0)
Alkaline Phosphatase: 122 U/L (ref 38–126)
Bilirubin, Direct: 0.2 mg/dL (ref 0.0–0.2)
Indirect Bilirubin: 0.7 mg/dL (ref 0.3–0.9)
Total Bilirubin: 0.9 mg/dL (ref 0.3–1.2)
Total Protein: 7.4 g/dL (ref 6.5–8.1)

## 2023-07-07 LAB — CBC
HCT: 20.2 % — ABNORMAL LOW (ref 39.0–52.0)
Hemoglobin: 7 g/dL — ABNORMAL LOW (ref 13.0–17.0)
MCH: 31.7 pg (ref 26.0–34.0)
MCHC: 34.7 g/dL (ref 30.0–36.0)
MCV: 91.4 fL (ref 80.0–100.0)
Platelets: 322 10*3/uL (ref 150–400)
RBC: 2.21 MIL/uL — ABNORMAL LOW (ref 4.22–5.81)
RDW: 13 % (ref 11.5–15.5)
WBC: 18 10*3/uL — ABNORMAL HIGH (ref 4.0–10.5)
nRBC: 0 % (ref 0.0–0.2)

## 2023-07-07 LAB — BASIC METABOLIC PANEL
Anion gap: 10 (ref 5–15)
BUN: 38 mg/dL — ABNORMAL HIGH (ref 6–20)
CO2: 25 mmol/L (ref 22–32)
Calcium: 8.6 mg/dL — ABNORMAL LOW (ref 8.9–10.3)
Chloride: 103 mmol/L (ref 98–111)
Creatinine, Ser: 5.38 mg/dL — ABNORMAL HIGH (ref 0.61–1.24)
GFR, Estimated: 12 mL/min — ABNORMAL LOW (ref >60)
Glucose, Bld: 103 mg/dL — ABNORMAL HIGH (ref 70–99)
Potassium: 3.6 mmol/L (ref 3.5–5.1)
Sodium: 138 mmol/L (ref 135–145)

## 2023-07-07 LAB — PREPARE RBC (CROSSMATCH)

## 2023-07-07 LAB — PROCALCITONIN: Procalcitonin: 0.64 ng/mL

## 2023-07-07 MED ORDER — SODIUM CHLORIDE 0.9% IV SOLUTION: Freq: Once | INTRAVENOUS | Status: AC

## 2023-07-07 MED ORDER — LEVOFLOXACIN 500 MG PO TABS
500.0000 mg | ORAL_TABLET | ORAL | Status: DC
  Administered 2023-07-09: 500 mg via ORAL
  Filled 2023-07-07: qty 1

## 2023-07-07 MED ORDER — LEVOFLOXACIN 500 MG PO TABS
750.0000 mg | ORAL_TABLET | Freq: Once | ORAL | Status: AC
  Administered 2023-07-07: 750 mg via ORAL
  Filled 2023-07-07: qty 2

## 2023-07-07 NOTE — Progress Notes (Signed)
[facility] Progress Note  Patient: Gary [name]    RUE:[id number]  DOA: 06/28/2023     Date of Service: the patient was seen and examined on 07/07/2023  Chief Complaint  Patient presents with   Shortness of Breath   Brief hospital course: Gary [name] is a 52 year old male with history of alcohol abuse, hypertension, hyperlipidemia, history of TIA, GERD, erectile dysfunction, tobacco use, history of seizure, who presents to the emergency department for chief concerns of shortness of breath and dizziness. 09/20: in ED, VSS, mild hyponatremia sodium 132, significantly elevated creatinine 4.18 w/ GFR 16, elevated LFT  AST 98 and ALT 76, Bili 1.9, ALP 156, BNP WNL, Troponin and CK WNL, WBC elevated 17.5, Hgb 9.1. CXR nonacute. Renal US normal no dilatation, Abd [place] (+)fatty liver, GB w/ diffuse wall thickening and focal nodular area measuring up to 19 by 7 x 5 mm concern for underlying lesion and advise compare w/ MRI. Given Protonix 80 mg IV one-time dose, sodium chloride 1 L bolus. Admitted to hospitalist service.  09/21: SOB improved, renal function worse Cr to 5.88, consulted to nephrology. Pt c/o scrotal pain, [place] (+)small bl hydrocele L varicocele L epididymal cyst - started tx for epididymitis given leukocytosis  09/22: early AM, scrotum apparently opened to drain - pt describes clear thin liquid, bandage appears serous fluid, no purulent, no erythema to skin - monitor. Cr worse, up to 8, nephrology following, continuing IV fluids.  09/23: Cr still worsening and may need to consider dialysis tomorrow, urology consult repeating US - no abscess 09/24: permcath placement and dialysis after that   Consultants:  Nephrology  Vascular surgery Urology    Procedures: none     Assessment and Plan: AKI (acute kidney injury) on CKD3a - worsening --> acute renal faillure/ESRD  suspect multifactorial including prerenal in setting of poor p.o./fluid intake, NSAID use,   glomerulonephritis, rhabdomyolysis, infection related AKI from epididymitis.  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   Shortness of breath without respiratory failure - resolved  Seems likely to be respiratory compensation for metabolic acidosis d/t renal failure  Differentials would include COPD history of tobacco use  CXR personally reviewed, no concerns  Neg troponin, ACS unlikely     Gallbladder wall thickening, ruled out via MRCP  [place] RUQ mass - nodular 19 mm x 7 mm x 5 mm  Associated w/ elevation in Bili, AST, ALT, ALP On 9/25 d/w general surgery, recommended MRCP without contrast and it did show gallbladder wall thickening.  Patient was seen by general surgery, asymptomatic, recommended no intervention.  Pt was seen by Dr. Tonna Boehringer   Elevated liver enzymes, resolved, LFTs within normal range on 9/29 Suspect secondary to gallbladder disease as above, complicated by alcohol abuse  LFT trended down, wnl now and Hepatitis panel neg    History Seizures Calculated creatinine clearance is 13 mL/min on admission Patient on Keppra 1000 mg p.o. twice daily at home. Placed on Keppra reduced dosing to 500 mg  p.o. twice daily in setting of acute kidney injury 9/28 added Keppra 500 mg p.o. 3 times a week to supplement after hemodialysis   Scrotal pain w/Small bilateral hydrocele Left-sided varicocele, 8 mm left epididymal cyst. Leukocytosis Ceftriaxone x1 + doxycycline 100 mg  bid x10 days (FQ would cover enteric pathogens but pt not sexually active for some time, FQ renal dosing problematic, will give doxy) started 09/21 Check for GC/CT in urine --> neg  Frequent dressing changes Urology following 9/28 elevated WBC count, added Augmentin 500 mg p.o. daily renal dose Repeat US scrotum No abscess/fluid collection, soft tissue edema 9/29 WBC count elevated, discontinue doxycycline, continued Augmentin and added Levaquin 750 mg x 1 dose followed by 500 mg q48 hr x 5 doses Trend WBC count daily   Hyponatremia -resolved Likely d/t renal failure  Follow BMP   Metabolic acidosis, resolved Mild, secondary to acute kidney injury Sodium bicarbonate 325 mg p.o. twice daily, 2 doses ordered 9/25-sodium bicarbonate 650 mg p.o. 3 times daily order placed 9/27 decreased sodium bicarb 650 p.o. twice daily, DC'd bicarb on 9/28  History CVA Statin held for now given elevation LFT  Continued Plavix    HLD  04/25/2023, cardiologist discontinued Pravachol and started patient on Crestor 40 mg w/ improvement in cholesterol and AST/ALT.  Hold statin for now w/ elevated LFT    Essential HTN On home amlodipine, carvedilol  Use hydralazine as needed Monitor BP and titrate medications accordingly  GERD  PPI   Insomnia Melatonin 5 mg nightly as needed for sleep   Tobacco abuse As needed nicotine patch ordered   Vitamin D Insufficiency: started vitamin D 50,000 units p.o. weekly, follow with PCP to repeat vitamin D level after 3 to 6 months.  Body mass index is 30.87 kg/m.  Interventions:  Diet: Renal diet DVT Prophylaxis: Subcutaneous Heparin    Advance goals of care discussion: Full code  Family Communication: family was not present at bedside, at the time of interview.  The pt provided permission to discuss medical plan with the family. Opportunity was given to ask question and all questions were answered satisfactorily.   Disposition:  Pt is from Home,  admitted with AKI, developed ESRD, started new hemodialysis, scrotal infection on antibiotics, which precludes a safe discharge. Discharge to Home, when stable, may need 1-2 more days to stay in the hospital.  Subjective: No significant events overnight, patient denies any worsening of scrotal swelling or pain.  Patient agreed for blood transfusion.  Overall patient is frustrated for being in the hospital for prolonged time.  Patient was counseled that he is still not ready to go.  Physical Exam: General: NAD, lying comfortably Appear in no distress, affect appropriate Eyes: PERRLA ENT: Oral Mucosa Clear, moist  Neck: no JVD,  Cardiovascular: S1 and S2 Present, no Murmur,  Respiratory: good respiratory effort, Bilateral Air entry equal and Decreased, no Crackles, no wheezes Abdomen: Bowel Sound present, Soft and no tenderness, right testicular swelling, mild erythema and drainage Skin: no rashes Extremities: no Pedal edema, no calf tenderness Neurologic: without any new focal findings Gait not checked due to patient safety concerns  Vitals:   07/07/23 0741 07/07/23 1216 07/07/23 1249 07/07/23 1306  BP: (!) 144/85 125/72 (!) 143/76 138/79  Pulse: 76 80 64 72  Resp: 18 15 17 17   Temp: 98.7 F (37.1 C) 98.7 F (37.1 C) 98.6 F (37 C) 98.6 F (37 C)  TempSrc:   Oral Oral  SpO2: 100% 100% 100% 100%  Weight:      Height:        Intake/Output Summary (Last 24 hours) at 07/07/2023 1337 Last data filed at 07/07/2023 1249 Gross per 24 hour  Intake 810 ml  Output --  Net 810 ml   Filed Weights   07/04/23 1050 07/05/23 0828 07/05/23 1229  Weight: 100.5 kg 97.6 kg 97.6 kg    Data Reviewed: I have personally reviewed and interpreted daily labs, tele strips, imagings as discussed above. I reviewed all nursing notes, pharmacy notes, vitals, pertinent old records I have discussed plan of care as described above with RN and patient/family.  CBC: Recent Labs  Lab 07/03/23 0719  07/04/23 0309 07/05/23 0622 07/06/23 0335 07/07/23 0309  WBC 12.6* 13.2* 14.9* 15.3* 18.0*  HGB 8.0* 7.3* 7.3* 7.1* 7.0*  HCT 22.8* 20.8* 21.4* 20.7* 20.2*  MCV 92.3 90.8 92.6 93.2 91.4  PLT 223 224 253 241 322   Basic Metabolic Panel: Recent Labs  Lab 07/03/23 0719 07/04/23 0309 07/05/23 0622 07/06/23 0335 07/07/23 0309  NA 137 137 140 137 138  K 4.5 3.7 3.5 3.4* 3.6  CL 114* 109 105 98 103  CO2 13* 19* 23 26 25   GLUCOSE 96 98 93 78 103*  BUN 86* 65* 46* 31* 38*  CREATININE 10.14* 7.94* 6.31* 4.84* 5.38*  CALCIUM 8.0* 7.9* 8.0* 8.1* 8.6*  MG  --  1.7 1.6* 1.6*  --   PHOS  --  4.7* 4.4 4.1  --     Studies: No results found.  Scheduled Meds:  amLODipine  10 mg Oral QPM   amoxicillin-clavulanate  1 tablet Oral Daily   aspirin EC  81 mg Oral Daily   carvedilol  25 mg Oral BID WC   Chlorhexidine Gluconate Cloth  6 each Topical Q0600   clopidogrel  75 mg Oral Daily   folic acid  1 mg Oral Daily   heparin  5,000 Units Subcutaneous Q8H   lactulose  20 g Oral Once   levETIRAcetam  500 mg Oral BID   [START ON 07/08/2023] levETIRAcetam  500 mg Oral Once per day on Monday Wednesday Friday   levofloxacin  750 mg Oral Once   Followed by   Melene Muller ON 07/09/2023] levofloxacin  500 mg Oral Q48H   magnesium oxide  400 mg Oral BID   nystatin   Topical BID   pantoprazole  40 mg Oral Daily   thiamine  100 mg Oral Daily   Vitamin D (Ergocalciferol)  50,000 Units Oral Q7 days   Continuous Infusions:   PRN Meds: acetaminophen, fentaNYL (SUBLIMAZE) injection, guaiFENesin-dextromethorphan, hydrALAZINE **OR** hydrALAZINE, LORazepam, melatonin, nicotine, ondansetron (ZOFRAN) IV, senna-docusate  Time spent: 55 minutes  Author: Gillis Santa. MD [facility] 07/07/2023 1:37 PM  To reach On-call, see care teams to locate the attending and reach out to them via [email or web address]. If 7PM-7AM, please contact night-coverage If you still have difficulty reaching the attending provider,  please page the [facility] (Director on Call) for [facility] on amion for assistance.

## 2023-07-07 NOTE — Plan of Care (Signed)
  Problem: Education: Goal: Knowledge of disease or condition will improve Outcome: Progressing Goal: Knowledge of secondary prevention will improve (MUST DOCUMENT ALL) Outcome: Progressing Goal: Knowledge of patient specific risk factors will improve Loraine Leriche N/A or DELETE if not current risk factor) Outcome: Progressing   Problem: Ischemic Stroke/TIA Tissue Perfusion: Goal: Complications of ischemic stroke/TIA will be minimized Outcome: Progressing   Problem: Coping: Goal: Will verbalize positive feelings about self Outcome: Progressing Goal: Will identify appropriate support needs Outcome: Progressing   Problem: Health Behavior/Discharge Planning: Goal: Ability to manage health-related needs will improve Outcome: Progressing Goal: Goals will be collaboratively established with patient/family Outcome: Progressing   Problem: Self-Care: Goal: Ability to participate in self-care as condition permits will improve Outcome: Progressing Goal: Verbalization of feelings and concerns over difficulty with self-care will improve Outcome: Progressing Goal: Ability to communicate needs accurately will improve Outcome: Progressing   Problem: Nutrition: Goal: Dietary intake will improve Outcome: Progressing   Problem: Education: Goal: Knowledge of General Education information will improve Description: Including pain rating scale, medication(s)/side effects and non-pharmacologic comfort measures Outcome: Progressing   Problem: Health Behavior/Discharge Planning: Goal: Ability to manage health-related needs will improve Outcome: Progressing   Problem: Clinical Measurements: Goal: Ability to maintain clinical measurements within normal limits will improve Outcome: Progressing Goal: Will remain free from infection Outcome: Progressing Goal: Diagnostic test results will improve Outcome: Progressing Goal: Respiratory complications will improve Outcome: Progressing Goal: Cardiovascular  complication will be avoided Outcome: Progressing   Problem: Activity: Goal: Risk for activity intolerance will decrease Outcome: Progressing   Problem: Nutrition: Goal: Adequate nutrition will be maintained Outcome: Progressing   Problem: Coping: Goal: Level of anxiety will decrease Outcome: Progressing   Problem: Elimination: Goal: Will not experience complications related to bowel motility Outcome: Progressing Goal: Will not experience complications related to urinary retention Outcome: Progressing   Problem: Pain Managment: Goal: General experience of comfort will improve Outcome: Progressing   Problem: Safety: Goal: Ability to remain free from injury will improve Outcome: Progressing   Problem: Skin Integrity: Goal: Risk for impaired skin integrity will decrease Outcome: Progressing

## 2023-07-07 NOTE — Progress Notes (Signed)
[facility]  ROUNDING NOTE   Subjective:   Creatinine now up to 5.4.  He will be due for dialysis again tomorrow. We also talked about the potential for renal biopsy.   Objective:  Vital signs in last 24 hours:  Temp:  [97 F (36.1 C)-98.7 F (37.1 C)] 98.6 F (37 C) (09/29 1306) Pulse Rate:  [64-80] 72 (09/29 1306) Resp:  [15-20] 17 (09/29 1306) BP: (125-148)/(72-85) 138/79 (09/29 1306) SpO2:  [98 %-100 %] 100 % (09/29 1306)  Weight change:  Filed Weights   07/04/23 1050 07/05/23 0828 07/05/23 1229  Weight: 100.5 kg 97.6 kg 97.6 kg    Intake/Output: I/O last 3 completed shifts: In: 800 [P.O.:800] Out: 900 [Urine:900]   Intake/Output this shift:  Total I/O In: 10 [I.V.:10] Out: -   Physical Exam: General: NAD  Head: Normocephalic, atraumatic. Moist oral mucosal membranes  Eyes: Anicteric  Lungs:  Clear to auscultation normal effort  Heart: Regular rate and rhythm  Abdomen:  Soft, nontender  Extremities: trace peripheral edema.  Neurologic: Alert, moving all four extremities  Skin: No lesions.  Scrotal edema and tenderness (improving)  Access: Rt chest permcath placed on 07/02/23 by Dr Wyn Quaker    Basic Metabolic Panel: Recent Labs  Lab 07/03/23 0719 07/04/23 0309 07/05/23 0622 07/06/23 0335 07/07/23 0309  NA 137 137 140 137 138  K 4.5 3.7 3.5 3.4* 3.6  CL 114* 109 105 98 103  CO2 13* 19* 23 26 25   GLUCOSE 96 98 93 78 103*  BUN 86* 65* 46* 31* 38*  CREATININE 10.14* 7.94* 6.31* 4.84* 5.38*  CALCIUM 8.0* 7.9* 8.0* 8.1* 8.6*  MG  --  1.7 1.6* 1.6*  --   PHOS  --  4.7* 4.4 4.1  --     Liver Function Tests: Recent Labs  Lab 07/01/23 0634 07/02/23 0258 07/07/23 0309  AST 51* 37 29  ALT 49* 44 23  ALKPHOS 169* 159* 122  BILITOT 1.2 1.1 0.9  PROT 6.5 6.7 7.4  ALBUMIN 2.5* 2.5* 2.9*   No results for input(s): "LIPASE", "AMYLASE" in the last 168 hours. No results for input(s): "AMMONIA" in the last 168 hours.  CBC: Recent Labs  Lab  07/03/23 0719 07/04/23 0309 07/05/23 0622 07/06/23 0335 07/07/23 0309  WBC 12.6* 13.2* 14.9* 15.3* 18.0*  HGB 8.0* 7.3* 7.3* 7.1* 7.0*  HCT 22.8* 20.8* 21.4* 20.7* 20.2*  MCV 92.3 90.8 92.6 93.2 91.4  PLT 223 224 253 241 322    Cardiac Enzymes: No results for input(s): "CKTOTAL", "CKMB", "CKMBINDEX", "TROPONINI" in the last 168 hours.   BNP: Invalid input(s): "POCBNP"  CBG: No results for input(s): "GLUCAP" in the last 168 hours.  Microbiology: Results for orders placed or performed during the hospital encounter of 06/28/23  Blood culture (single)     Status: None   Collection Time: 06/28/23 12:22 PM   Specimen: BLOOD  Result Value Ref Range Status   Specimen Description BLOOD LEFT ANTECUBITAL  Final   Special Requests   Final    BOTTLES DRAWN AEROBIC AND ANAEROBIC Blood Culture adequate volume   Culture   Final    NO GROWTH 5 DAYS Performed at [facility], [address], [place], [place] [address]    Report Status 07/03/2023 FINAL  Final  SARS Coronavirus 2 by RT PCR (hospital order, performed in [facility] hospital lab) *cepheid single result test* Anterior Nasal Swab     Status: None   Collection Time: 06/29/23 12:15 AM  Specimen: Anterior Nasal Swab  Result Value Ref Range Status   SARS Coronavirus 2 by RT PCR NEGATIVE NEGATIVE Final    Comment: (NOTE) SARS-CoV-2 target nucleic acids are NOT DETECTED.  The SARS-CoV-2 RNA is generally detectable in upper and lower respiratory specimens during the acute phase of infection. The lowest concentration of SARS-CoV-2 viral copies this assay can detect is 250 copies / mL. A negative result does not preclude SARS-CoV-2 infection and should not be used as the sole basis for treatment or other patient management decisions.  A negative result may occur with improper specimen collection / handling, submission of specimen other than nasopharyngeal swab, presence of viral mutation(s) within the areas targeted by  this assay, and inadequate number of viral copies (<250 copies / mL). A negative result must be combined with clinical observations, patient history, and epidemiological information.  Fact Sheet for Patients:   RoadLapTop.co.za  Fact Sheet for Healthcare Providers: http://kim-miller.com/  This test is not yet approved or  cleared by the [place] FDA and has been authorized for detection and/or diagnosis of SARS-CoV-2 by FDA under an Emergency Use Authorization (EUA).  This EUA will remain in effect (meaning this test can be used) for the duration of the COVID-19 declaration under Section 564(b)(1) of the Act, 21 U.S.C. section 360bbb-3(b)(1), unless the authorization is terminated or revoked sooner.  Performed at [facility], [address] [place], [place] [address]   Chlamydia/NGC rt PCR [facility] only)     Status: None   Collection Time: 06/29/23 10:42 AM   Specimen: Urine; GU  Result Value Ref Range Status   Specimen source GC/Chlam URINE, RANDOM  Final   Chlamydia Tr NOT DETECTED NOT DETECTED Final   N gonorrhoeae NOT DETECTED NOT DETECTED Final    Comment: (NOTE) This CT/NG assay has not been evaluated in patients with a history of  hysterectomy. Performed at [facility], [address] [place], [place] [address]     Coagulation Studies: No results for input(s): "LABPROT", "INR" in the last 72 hours.   Urinalysis: No results for input(s): "COLORURINE", "LABSPEC", "PHURINE", "GLUCOSEU", "HGBUR", "BILIRUBINUR", "KETONESUR", "PROTEINUR", "UROBILINOGEN", "NITRITE", "LEUKOCYTESUR" in the last 72 hours.  Invalid input(s): "APPERANCEUR"     Imaging: [place] RT LOWER EXTREM LTD SOFT TISSUE NON VASCULAR  Result Date: 07/06/2023 CLINICAL DATA:  Concern for RIGHT groin abscess. Fluid drainage. Evaluate for abscess in, no masses EXAM: ULTRASOUND RIGHT LOWER EXTREMITY LIMITED TECHNIQUE: Ultrasound examination  of the lower extremity soft tissues was performed in the area of clinical concern. COMPARISON:  None Available. FINDINGS: Evaluation of the RIGHT groin inguinal area demonstrates subcutaneous edema. No organized fluid collection. Potential drainage site at the skin level. IMPRESSION: 1. No organized fluid collection in the RIGHT groin inguinal region. 2. Extensive subcutaneous edema. Electronically Signed   By: Genevive Bi M.D.   On: 07/06/2023 14:55     Medications:      amLODipine  10 mg Oral QPM   amoxicillin-clavulanate  1 tablet Oral Daily   aspirin EC  81 mg Oral Daily   carvedilol  25 mg Oral BID WC   Chlorhexidine Gluconate Cloth  6 each Topical Q0600   clopidogrel  75 mg Oral Daily   folic acid  1 mg Oral Daily   heparin  5,000 Units Subcutaneous Q8H   lactulose  20 g Oral Once   levETIRAcetam  500 mg Oral BID   [START ON 07/08/2023] levETIRAcetam  500 mg Oral Once per day  on Monday Wednesday Friday   [START ON 07/09/2023] levofloxacin  500 mg Oral Q48H   magnesium oxide  400 mg Oral BID   nystatin   Topical BID   pantoprazole  40 mg Oral Daily   thiamine  100 mg Oral Daily   Vitamin D (Ergocalciferol)  50,000 Units Oral Q7 days   acetaminophen, fentaNYL (SUBLIMAZE) injection, guaiFENesin-dextromethorphan, hydrALAZINE **OR** hydrALAZINE, LORazepam, melatonin, nicotine, ondansetron (ZOFRAN) IV, senna-docusate  Assessment/ Plan:  Gary [name] is a 52 y.o.  male with medical problems of hypertension, alcohol abuse, hyperlipidemia, history of TIA, GERD, erectile dysfunction, tobacco abuse, history of seizures  was admitted on 06/28/2023 for Shortness of breath [R06.02] SOB (shortness of breath) [R06.02] AKI (acute kidney injury) (HCC) [N17.9] Anemia, unspecified type [D64.9]   Acute kidney injury on chronic kidney disease stage IIIa/proteinuria. AKI secondary to severe ATN with chronic NSAID use HTN Proteinuria. Elevated liver enzymes Hypoalbuminemia Fatty  liver Alcohol abuse Current smoker Scrotal Abscess  Lab Results  Component Value Date   CREATININE 5.38 (H) 07/07/2023   CREATININE 4.84 (H) 07/06/2023   CREATININE 6.31 (H) 07/05/2023    Intake/Output Summary (Last 24 hours) at 07/07/2023 1516 Last data filed at 07/07/2023 1249 Gross per 24 hour  Intake 210 ml  Output --  Net 210 ml   Baseline creatinine of 1.61, GFR 51 from 06/13/2023.  Presenting creatinine of 4.18, GFR 16.  In addition patient has elevated liver enzymes suggesting hepatitis possibly alcoholic hepatitis.  Albumin is low.  Diffuse echogenic liver consistent with fatty infiltration. Urinalysis with moderate blood, 0-5 RBCs, 0-5 WBCs, 100 mg of protein. Renal ultrasound is negative for obstruction, mass.   Differential diagnosis includes nonsteroidals induced AKI, FSGS, glomerulonephritis, infection   Protein creatinine ratio 2.23, CK 106, UDS negative Negative ANA/ANCA   Plan: Creatinine up to 5.38 off of dialysis.  Therefore we will need to plan for hemodialysis treatment again tomorrow.  Hemoglobin is dropped to 7 today.  Therefore blood transfusion being planned today.  Given the fact that renal function overall remains low consider renal biopsy.  This was discussed in depth with the patient today.  We may need to consider performing biopsy on Tuesday of the upcoming week.    LOS: 9 Gary [name] 9/29/20243:16 PM

## 2023-07-07 NOTE — Plan of Care (Signed)
  Problem: Education: Goal: Knowledge of disease or condition will improve 07/07/2023 1438 by Rhonin Trott Bet, LPN Outcome: Progressing 07/07/2023 1438 by Lateisha Thurlow Bet, LPN Outcome: Progressing    Problem: Elimination: Goal: Will not experience complications related to bowel motility 07/07/2023 1438 by Caellum Mancil Bet, LPN Outcome: Progressing 07/07/2023 1438 by Aragorn Recker Bet, LPN Outcome: Progressing   Problem: Activity: Goal: Risk for activity intolerance will decrease 07/07/2023 1438 by Levie Wages Bet, LPN Outcome: Progressing 07/07/2023 1438 by Olyn Landstrom Bet, LPN Outcome: Progressing   Problem: Safety: Goal: Ability to remain free from injury will improve 07/07/2023 1438 by Vidur Knust Bet, LPN Outcome: Progressing 07/07/2023 1438 by Chanan Detwiler Bet, LPN Outcome: Progressing   Problem: Pain Managment: Goal: General experience of comfort will improve 07/07/2023 1438 by Katina Remick Bet, LPN Outcome: Progressing 07/07/2023 1438 by Leonard Feigel Bet, LPN Outcome: Progressing

## 2023-07-08 LAB — RENAL FUNCTION PANEL
Albumin: 3.1 g/dL — ABNORMAL LOW (ref 3.5–5.0)
Anion gap: 12 (ref 5–15)
BUN: 41 mg/dL — ABNORMAL HIGH (ref 6–20)
CO2: 22 mmol/L (ref 22–32)
Calcium: 8.9 mg/dL (ref 8.9–10.3)
Chloride: 105 mmol/L (ref 98–111)
Creatinine, Ser: 4.8 mg/dL — ABNORMAL HIGH (ref 0.61–1.24)
GFR, Estimated: 14 mL/min — ABNORMAL LOW (ref >60)
Glucose, Bld: 105 mg/dL — ABNORMAL HIGH (ref 70–99)
Phosphorus: 4.1 mg/dL (ref 2.5–4.6)
Potassium: 3.5 mmol/L (ref 3.5–5.1)
Sodium: 139 mmol/L (ref 135–145)

## 2023-07-08 LAB — BPAM RBC
Blood Product Expiration Date: 202410282359
ISSUE DATE / TIME: 202409291241
Unit Type and Rh: 5100

## 2023-07-08 LAB — TYPE AND SCREEN
ABO/RH(D): O POS
Antibody Screen: NEGATIVE
Unit division: 0

## 2023-07-08 LAB — PROCALCITONIN: Procalcitonin: 0.43 ng/mL

## 2023-07-08 LAB — CBC
HCT: 23.8 % — ABNORMAL LOW (ref 39.0–52.0)
Hemoglobin: 8.2 g/dL — ABNORMAL LOW (ref 13.0–17.0)
MCH: 31.1 pg (ref 26.0–34.0)
MCHC: 34.5 g/dL (ref 30.0–36.0)
MCV: 90.2 fL (ref 80.0–100.0)
Platelets: 348 10*3/uL (ref 150–400)
RBC: 2.64 MIL/uL — ABNORMAL LOW (ref 4.22–5.81)
RDW: 14 % (ref 11.5–15.5)
WBC: 17.4 10*3/uL — ABNORMAL HIGH (ref 4.0–10.5)
nRBC: 0 % (ref 0.0–0.2)

## 2023-07-08 MED ORDER — ROSUVASTATIN CALCIUM 10 MG PO TABS
20.0000 mg | ORAL_TABLET | Freq: Every day | ORAL | Status: DC
  Administered 2023-07-08 – 2023-07-09 (×2): 20 mg via ORAL
  Filled 2023-07-08 (×2): qty 2

## 2023-07-08 MED ORDER — HEPARIN SODIUM (PORCINE) 1000 UNIT/ML IJ SOLN
INTRAMUSCULAR | Status: AC
  Filled 2023-07-08: qty 10

## 2023-07-08 NOTE — Progress Notes (Signed)
  Received patient in bed to unit.   Informed consent signed and in chart.    TX duration: 3.5HRS     Transported back to floor  Hand-off given to patient's nurse. No c/o and no distress noted    Access used: R HD Catheter Access issues: none   Total UF removed: 0.5L Medication(s) given: none Post HD VS: 133/84 Post HD weight: 91.0kg     Lynann Beaver  [facility]

## 2023-07-08 NOTE — Plan of Care (Signed)
  Problem: Coping: Goal: Will verbalize positive feelings about self Outcome: Progressing Goal: Will identify appropriate support needs Outcome: Progressing   Problem: Health Behavior/Discharge Planning: Goal: Ability to manage health-related needs will improve Outcome: Progressing   Problem: Self-Care: Goal: Ability to participate in self-care as condition permits will improve Outcome: Progressing Goal: Ability to communicate needs accurately will improve Outcome: Progressing   Problem: Nutrition: Goal: Dietary intake will improve Outcome: Progressing   Problem: Activity: Goal: Risk for activity intolerance will decrease Outcome: Progressing   Problem: Nutrition: Goal: Adequate nutrition will be maintained Outcome: Progressing   Problem: Coping: Goal: Level of anxiety will decrease Outcome: Progressing   Problem: Elimination: Goal: Will not experience complications related to bowel motility Outcome: Progressing   Problem: Pain Managment: Goal: General experience of comfort will improve Outcome: Progressing   Problem: Skin Integrity: Goal: Risk for impaired skin integrity will decrease Outcome: Progressing

## 2023-07-08 NOTE — Plan of Care (Signed)
  Problem: Skin Integrity: Goal: Risk for impaired skin integrity will decrease Outcome: Progressing   Problem: Pain Managment: Goal: General experience of comfort will improve Outcome: Progressing   Problem: Safety: Goal: Ability to remain free from injury will improve Outcome: Progressing   Problem: Elimination: Goal: Will not experience complications related to bowel motility Outcome: Progressing   Problem: Coping: Goal: Level of anxiety will decrease Outcome: Progressing   Problem: Activity: Goal: Risk for activity intolerance will decrease Outcome: Progressing

## 2023-07-08 NOTE — Discharge Planning (Signed)
Per [facility] admissions, patient does not qualify for Medicaid based off their assessment, therefore cannot be placed at this time unless:  The Hospital is successful in obtaining Medicaid through their process. Patient's diagnosis changes to ESRD, which patient would then qualify for Medicare. The Hospital agrees to a Single Payor Agreement, which in the passed has been denied.  Dimas Chyle Dialysis Coordinator II  [facility] Cell: [phone] eFax: [phone] [email or web address]

## 2023-07-08 NOTE — Progress Notes (Addendum)
[facility]  ROUNDING NOTE   Subjective:   Patient seen and evaluated during dialysis   HEMODIALYSIS FLOWSHEET:  Blood Flow Rate (mL/min): 399 mL/min Arterial Pressure (mmHg): -186.25 mmHg Venous Pressure (mmHg): 165.04 mmHg TMP (mmHg): 3.03 mmHg Ultrafiltration Rate (mL/min): 662 mL/min Dialysate Flow Rate (mL/min): 299 ml/min  Tolerating treatment   Objective:  Vital signs in last 24 hours:  Temp:  [98 F (36.7 C)-98.9 F (37.2 C)] 98 F (36.7 C) (09/30 0738) Pulse Rate:  [64-87] 66 (09/30 0930) Resp:  [15-22] 20 (09/30 0930) BP: (125-165)/(72-93) 162/90 (09/30 0930) SpO2:  [98 %-100 %] 99 % (09/30 0930) Weight:  [92 kg] 92 kg (09/30 0750)  Weight change:  Filed Weights   07/05/23 0828 07/05/23 1229 07/08/23 0750  Weight: 97.6 kg 97.6 kg 92 kg    Intake/Output: I/O last 3 completed shifts: In: 939.8 [P.O.:440; I.V.:39.8; Blood:460] Out: -    Intake/Output this shift:  No intake/output data recorded.  Physical Exam: General: NAD  Head: Normocephalic, atraumatic. Moist oral mucosal membranes  Eyes: Anicteric  Lungs:  Clear to auscultation normal effort  Heart: Regular rate and rhythm  Abdomen:  Soft, nontender  Extremities: trace peripheral edema.  Neurologic: Alert, moving all four extremities  Skin: No lesions.  Scrotal edema and tenderness (improving)  Access: Rt chest permcath placed on 07/02/23 by Dr Wyn Quaker    Basic Metabolic Panel: Recent Labs  Lab 07/04/23 0309 07/05/23 0622 07/06/23 0335 07/07/23 0309 07/08/23 0755  NA 137 140 137 138 139  K 3.7 3.5 3.4* 3.6 3.5  CL 109 105 98 103 105  CO2 19* 23 26 25 22   GLUCOSE 98 93 78 103* 105*  BUN 65* 46* 31* 38* 41*  CREATININE 7.94* 6.31* 4.84* 5.38* 4.80*  CALCIUM 7.9* 8.0* 8.1* 8.6* 8.9  MG 1.7 1.6* 1.6*  --   --   PHOS 4.7* 4.4 4.1  --  4.1    Liver Function Tests: Recent Labs  Lab 07/02/23 0258 07/07/23 0309 07/08/23 0755  AST 37 29  --   ALT 44 23  --   ALKPHOS 159*  122  --   BILITOT 1.1 0.9  --   PROT 6.7 7.4  --   ALBUMIN 2.5* 2.9* 3.1*   No results for input(s): "LIPASE", "AMYLASE" in the last 168 hours. No results for input(s): "AMMONIA" in the last 168 hours.  CBC: Recent Labs  Lab 07/04/23 0309 07/05/23 0622 07/06/23 0335 07/07/23 0309 07/08/23 0536  WBC 13.2* 14.9* 15.3* 18.0* 17.4*  HGB 7.3* 7.3* 7.1* 7.0* 8.2*  HCT 20.8* 21.4* 20.7* 20.2* 23.8*  MCV 90.8 92.6 93.2 91.4 90.2  PLT 224 253 241 322 348    Cardiac Enzymes: No results for input(s): "CKTOTAL", "CKMB", "CKMBINDEX", "TROPONINI" in the last 168 hours.   BNP: Invalid input(s): "POCBNP"  CBG: No results for input(s): "GLUCAP" in the last 168 hours.  Microbiology: Results for orders placed or performed during the hospital encounter of 06/28/23  Blood culture (single)     Status: None   Collection Time: 06/28/23 12:22 PM   Specimen: BLOOD  Result Value Ref Range Status   Specimen Description BLOOD LEFT ANTECUBITAL  Final   Special Requests   Final    BOTTLES DRAWN AEROBIC AND ANAEROBIC Blood Culture adequate volume   Culture   Final    NO GROWTH 5 DAYS Performed at [facility], [address], [place], [place] [address]    Report Status 07/03/2023 FINAL  Final  SARS Coronavirus 2 by RT PCR (hospital order, performed in [facility] hospital lab) *cepheid single result test* Anterior Nasal Swab     Status: None   Collection Time: 06/29/23 12:15 AM   Specimen: Anterior Nasal Swab  Result Value Ref Range Status   SARS Coronavirus 2 by RT PCR NEGATIVE NEGATIVE Final    Comment: (NOTE) SARS-CoV-2 target nucleic acids are NOT DETECTED.  The SARS-CoV-2 RNA is generally detectable in upper and lower respiratory specimens during the acute phase of infection. The lowest concentration of SARS-CoV-2 viral copies this assay can detect is 250 copies / mL. A negative result does not preclude SARS-CoV-2 infection and should not be used as the sole basis for  treatment or other patient management decisions.  A negative result may occur with improper specimen collection / handling, submission of specimen other than nasopharyngeal swab, presence of viral mutation(s) within the areas targeted by this assay, and inadequate number of viral copies (<250 copies / mL). A negative result must be combined with clinical observations, patient history, and epidemiological information.  Fact Sheet for Patients:   RoadLapTop.co.za  Fact Sheet for Healthcare Providers: http://kim-miller.com/  This test is not yet approved or  cleared by the [place] FDA and has been authorized for detection and/or diagnosis of SARS-CoV-2 by FDA under an Emergency Use Authorization (EUA).  This EUA will remain in effect (meaning this test can be used) for the duration of the COVID-19 declaration under Section 564(b)(1) of the Act, 21 U.S.C. section 360bbb-3(b)(1), unless the authorization is terminated or revoked sooner.  Performed at [facility], [address] [place], [place] [address]   Chlamydia/NGC rt PCR [facility] only)     Status: None   Collection Time: 06/29/23 10:42 AM   Specimen: Urine; GU  Result Value Ref Range Status   Specimen source GC/Chlam URINE, RANDOM  Final   Chlamydia Tr NOT DETECTED NOT DETECTED Final   N gonorrhoeae NOT DETECTED NOT DETECTED Final    Comment: (NOTE) This CT/NG assay has not been evaluated in patients with a history of  hysterectomy. Performed at [facility], [address] [place], [place] [address]     Coagulation Studies: No results for input(s): "LABPROT", "INR" in the last 72 hours.   Urinalysis: No results for input(s): "COLORURINE", "LABSPEC", "PHURINE", "GLUCOSEU", "HGBUR", "BILIRUBINUR", "KETONESUR", "PROTEINUR", "UROBILINOGEN", "NITRITE", "LEUKOCYTESUR" in the last 72 hours.  Invalid input(s): "APPERANCEUR"     Imaging: [place] RT LOWER  EXTREM LTD SOFT TISSUE NON VASCULAR  Result Date: 07/06/2023 CLINICAL DATA:  Concern for RIGHT groin abscess. Fluid drainage. Evaluate for abscess in, no masses EXAM: ULTRASOUND RIGHT LOWER EXTREMITY LIMITED TECHNIQUE: Ultrasound examination of the lower extremity soft tissues was performed in the area of clinical concern. COMPARISON:  None Available. FINDINGS: Evaluation of the RIGHT groin inguinal area demonstrates subcutaneous edema. No organized fluid collection. Potential drainage site at the skin level. IMPRESSION: 1. No organized fluid collection in the RIGHT groin inguinal region. 2. Extensive subcutaneous edema. Electronically Signed   By: Genevive Bi M.D.   On: 07/06/2023 14:55     Medications:      amLODipine  10 mg Oral QPM   amoxicillin-clavulanate  1 tablet Oral Daily   aspirin EC  81 mg Oral Daily   carvedilol  25 mg Oral BID WC   Chlorhexidine Gluconate Cloth  6 each Topical Q0600   clopidogrel  75 mg Oral Daily   folic acid  1 mg Oral Daily  heparin  5,000 Units Subcutaneous Q8H   lactulose  20 g Oral Once   levETIRAcetam  500 mg Oral BID   levETIRAcetam  500 mg Oral Once per day on Monday Wednesday Friday   [START ON 07/09/2023] levofloxacin  500 mg Oral Q48H   magnesium oxide  400 mg Oral BID   nystatin   Topical BID   pantoprazole  40 mg Oral Daily   thiamine  100 mg Oral Daily   Vitamin D (Ergocalciferol)  50,000 Units Oral Q7 days   acetaminophen, fentaNYL (SUBLIMAZE) injection, guaiFENesin-dextromethorphan, hydrALAZINE **OR** hydrALAZINE, LORazepam, melatonin, nicotine, ondansetron (ZOFRAN) IV, senna-docusate  Assessment/ Plan:  Mr. Seldon Barrell is a 52 y.o.  male with medical problems of hypertension, alcohol abuse, hyperlipidemia, history of TIA, GERD, erectile dysfunction, tobacco abuse, history of seizures  was admitted on 06/28/2023 for Shortness of breath [R06.02] SOB (shortness of breath) [R06.02] AKI (acute kidney injury) (HCC) [N17.9] Anemia,  unspecified type [D64.9]   Acute kidney injury on chronic kidney disease stage IIIa/proteinuria. AKI secondary to severe ATN with chronic NSAID use HTN Proteinuria. Elevated liver enzymes Hypoalbuminemia Fatty liver Alcohol abuse Current smoker Scrotal Abscess  Lab Results  Component Value Date   CREATININE 4.80 (H) 07/08/2023   CREATININE 5.38 (H) 07/07/2023   CREATININE 4.84 (H) 07/06/2023    Intake/Output Summary (Last 24 hours) at 07/08/2023 0955 Last data filed at 07/07/2023 1646 Gross per 24 hour  Intake 739.83 ml  Output --  Net 739.83 ml   Baseline creatinine of 1.61, GFR 51 from 06/13/2023.  Presenting creatinine of 4.18, GFR 16.  In addition patient has elevated liver enzymes suggesting hepatitis possibly alcoholic hepatitis.  Albumin is low.  Diffuse echogenic liver consistent with fatty infiltration. Urinalysis with moderate blood, 0-5 RBCs, 0-5 WBCs, 100 mg of protein. Renal ultrasound is negative for obstruction, mass.   Differential diagnosis includes nonsteroidals induced AKI, FSGS, glomerulonephritis, infection   Protein creatinine ratio 2.23, CK 106, UDS negative Negative ANA/ANCA   Plan: Receiving dialysis today, 0.5L as tolerated.  Hgb has improved with blood transfusion. This was discussed in depth with the patient today.   We were considering renal biopsy however due to Plavix and elevated blood pressure with history of multiple CVAs, will defer for now. Elevated WBC    LOS: 10 [place] Stroope 9/30/20249:55 AM

## 2023-07-08 NOTE — Progress Notes (Signed)
[facility] Progress Note  Patient: Gary [name]    XBM:[id number]  DOA: 06/28/2023     Date of Service: the patient was seen and examined on 07/08/2023  Chief Complaint  Patient presents with   Shortness of Breath   Brief hospital course: [name] is a 52 year old male with history of alcohol abuse, hypertension, hyperlipidemia, history of TIA, GERD, erectile dysfunction, tobacco use, history of seizure, who presents to the emergency department for chief concerns of shortness of breath and dizziness. 09/20: in ED, VSS, mild hyponatremia sodium 132, significantly elevated creatinine 4.18 w/ GFR 16, elevated LFT  AST 98 and ALT 76, Bili 1.9, ALP 156, BNP WNL, Troponin and CK WNL, WBC elevated 17.5, Hgb 9.1. CXR nonacute. Renal US normal no dilatation, Abd [place] (+)fatty liver, GB w/ diffuse wall thickening and focal nodular area measuring up to 19 by 7 x 5 mm concern for underlying lesion and advise compare w/ MRI. Given Protonix 80 mg IV one-time dose, sodium chloride 1 L bolus. Admitted to hospitalist service.  09/21: SOB improved, renal function worse Cr to 5.88, consulted to nephrology. Pt c/o scrotal pain, [place] (+)small bl hydrocele L varicocele L epididymal cyst - started tx for epididymitis given leukocytosis  09/22: early AM, scrotum apparently opened to drain - pt describes clear thin liquid, bandage appears serous fluid, no purulent, no erythema to skin - monitor. Cr worse, up to 8, nephrology following, continuing IV fluids.  09/23: Cr still worsening and may need to consider dialysis tomorrow, urology consult repeating US - no abscess 09/24: permcath placement and dialysis after that   Consultants:  Nephrology  Vascular surgery Urology    Procedures: none     Assessment and Plan: AKI (acute kidney injury) on CKD3a - worsening --> acute renal faillure/ESRD  suspect multifactorial including prerenal in setting of poor p.o./fluid intake, NSAID use,   glomerulonephritis, rhabdomyolysis, infection related AKI from epididymitis.  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 9/30 Hb 8.2 posttransfusion  Shortness of breath without respiratory failure - resolved  Seems likely to be respiratory compensation for metabolic acidosis d/t renal failure  Differentials would include COPD history of tobacco use  CXR personally reviewed, no concerns  Neg troponin, ACS unlikely     Gallbladder wall thickening, ruled out via MRCP  [place] RUQ mass - nodular 19 mm x 7 mm x 5 mm  Associated w/ elevation in Bili, AST, ALT, ALP On 9/25 d/w general surgery, recommended MRCP without contrast and it did show gallbladder wall thickening.  Patient was seen by general surgery, asymptomatic, recommended no intervention.  Pt was seen by Dr. Tonna Boehringer   Elevated liver enzymes, resolved, LFTs within normal range on 9/29 Suspect secondary to gallbladder disease as above, complicated by alcohol abuse  LFT trended down, wnl now and Hepatitis panel neg    History Seizures Calculated creatinine clearance is 13 mL/min on admission Patient on Keppra 1000 mg p.o. twice daily at home. Placed on Keppra reduced dosing  to 500 mg p.o. twice daily in setting of acute kidney injury 9/28 added Keppra 500 mg p.o. 3 times a week to supplement after hemodialysis   Scrotal pain w/Small bilateral hydrocele Left-sided varicocele, 8 mm left epididymal  cyst. Leukocytosis Ceftriaxone x1 + doxycycline 100 mg bid x10 days (FQ would cover enteric pathogens but pt not sexually active for some time, FQ renal dosing problematic, will give doxy) started 09/21 Check for GC/CT in urine --> neg  Frequent dressing changes Urology following 9/28 elevated WBC count, added Augmentin 500 mg p.o. daily renal dose Repeat US scrotum No abscess/fluid collection, soft tissue edema 9/29 WBC count elevated, discontinue doxycycline, continued Augmentin and added Levaquin 750 mg x 1 dose followed by 500 mg q48 hr x 5 doses Trend WBC count daily   Hyponatremia -resolved Likely d/t renal failure  Follow BMP   Metabolic acidosis, resolved Mild, secondary to acute kidney injury Sodium bicarbonate 325 mg p.o. twice daily, 2 doses ordered 9/25-sodium bicarbonate 650 mg p.o. 3 times daily order placed 9/27 decreased sodium bicarb 650 p.o. twice daily, DC'd bicarb on 9/28  History CVA Statin held for now given elevation LFT  Continued Plavix    HLD  04/25/2023, cardiologist discontinued Pravachol and started patient on Crestor 40 mg w/ improvement in cholesterol and AST/ALT.  Held statin due to elevated LFT, which improved. 9/30 resumed Crestor 20 mg p.o. daily at lower dose   Essential HTN On home amlodipine, carvedilol  Use hydralazine as needed Monitor BP and titrate medications accordingly  GERD  PPI   Insomnia Melatonin 5 mg nightly as needed for sleep   Tobacco abuse As needed nicotine patch ordered   Vitamin D Insufficiency: started vitamin D 50,000 units p.o. weekly, follow with PCP to repeat vitamin D level after 3 to 6 months.  Body mass index is 28.79 kg/m.  Interventions:  Diet: Renal diet DVT Prophylaxis: Subcutaneous Heparin    Advance goals of care discussion: Full code  Family Communication: family was not present at bedside, at the time of interview.  The pt provided permission to discuss medical plan with the family.  Opportunity was given to ask question and all questions were answered satisfactorily.   Disposition:  Pt is from Home, admitted with AKI, developed ESRD, started new hemodialysis, scrotal infection on antibiotics, which precludes a safe discharge. Discharge to Home, when stable, gradually improving, patient can be discharged home tomorrow if outpatient dialysis set up has been done.  Follow TOC for disposition plan Patient is uninsured   Subjective: No significant events overnight, patient was seen during hemodialysis, resting comfortably, stated that scrotal swelling is getting better, no any other complaints.   Physical Exam: General: NAD, lying comfortably Appear in no distress, affect appropriate Eyes: PERRLA ENT: Oral Mucosa Clear, moist  Neck: no JVD,  Cardiovascular: S1 and S2 Present, no Murmur,  Respiratory: good respiratory effort, Bilateral Air entry equal and Decreased, no Crackles, no wheezes Abdomen: Bowel Sound present, Soft and no tenderness, right testicular swelling, mild erythema and drainage Skin: no rashes Extremities: no Pedal edema, no calf tenderness Neurologic: without any new focal findings Gait not checked due to patient safety concerns  Vitals:   07/08/23 1130 07/08/23 1152 07/08/23 1204 07/08/23 1216  BP: (!) 140/91 133/84  (!) 135/92  Pulse: 74 81  82  Resp: 16 20  18   Temp:  98.7 F (37.1 C)  98.5 F (36.9 C)  TempSrc:  Oral  Oral  SpO2: 99% 100%  100%  Weight:   91 kg   Height:        Intake/Output Summary (Last 24 hours) at 07/08/2023 1246 Last data filed at 07/08/2023 1152 Gross per 24 hour  Intake 739.83 ml  Output 500 ml  Net 239.83 ml   Filed Weights   07/05/23 1229 07/08/23 0750 07/08/23 1204  Weight: 97.6 kg 92 kg 91 kg    Data Reviewed: I have personally reviewed and interpreted daily labs, tele strips, imagings as discussed above. I reviewed all nursing notes, pharmacy notes, vitals, pertinent old records I have discussed  plan of care as described above with RN and patient/family.  CBC: Recent Labs  Lab 07/04/23 0309 07/05/23 0622 07/06/23 0335 07/07/23 0309 07/08/23 0536  WBC 13.2* 14.9* 15.3* 18.0* 17.4*  HGB 7.3* 7.3* 7.1* 7.0* 8.2*  HCT 20.8* 21.4* 20.7* 20.2* 23.8*  MCV 90.8 92.6 93.2 91.4 90.2  PLT 224 253 241 322 348   Basic Metabolic Panel: Recent Labs  Lab 07/04/23 0309 07/05/23 0622 07/06/23 0335 07/07/23 0309 07/08/23 0755  NA 137 140 137 138 139  K 3.7 3.5 3.4* 3.6 3.5  CL 109 105 98 103 105  CO2 19* 23 26 25 22   GLUCOSE 98 93 78 103* 105*  BUN 65* 46* 31* 38* 41*  CREATININE 7.94* 6.31* 4.84* 5.38* 4.80*  CALCIUM 7.9* 8.0* 8.1* 8.6* 8.9  MG 1.7 1.6* 1.6*  --   --   PHOS 4.7* 4.4 4.1  --  4.1    Studies: No results found.  Scheduled Meds:  amLODipine  10 mg Oral QPM   amoxicillin-clavulanate  1 tablet Oral Daily   aspirin EC  81 mg Oral Daily   carvedilol  25 mg Oral BID WC   Chlorhexidine Gluconate Cloth  6 each Topical Q0600   clopidogrel  75 mg Oral Daily   folic acid  1 mg Oral Daily   heparin  5,000 Units Subcutaneous Q8H   lactulose  20 g Oral Once   levETIRAcetam  500 mg Oral BID   levETIRAcetam  500 mg Oral Once per day on Monday Wednesday Friday   [START ON 07/09/2023] levofloxacin  500 mg Oral Q48H   magnesium oxide  400 mg Oral BID   nystatin   Topical BID   pantoprazole  40 mg Oral Daily   thiamine  100 mg Oral Daily   Vitamin D (Ergocalciferol)  50,000 Units Oral Q7 days   Continuous Infusions:   PRN Meds: acetaminophen, fentaNYL (SUBLIMAZE) injection, guaiFENesin-dextromethorphan, hydrALAZINE **OR** hydrALAZINE, LORazepam, melatonin, nicotine, ondansetron (ZOFRAN) IV, senna-docusate  Time spent: 35 minutes  Author: Gillis Santa. MD [facility] 07/08/2023 12:46 PM  To reach On-call, see care teams to locate the attending and reach out to them via [email or web address]. If 7PM-7AM, please contact night-coverage If you still have difficulty  reaching the attending provider, please page the [facility] (Director on Call) for [facility] on amion for assistance.

## 2023-07-09 ENCOUNTER — Other Ambulatory Visit: Payer: Self-pay

## 2023-07-09 LAB — MULTIPLE MYELOMA PANEL, SERUM
Albumin SerPl Elph-Mcnc: 2.8 g/dL — ABNORMAL LOW (ref 2.9–4.4)
Albumin/Glob SerPl: 0.7 (ref 0.7–1.7)
Alpha 1: 0.5 g/dL — ABNORMAL HIGH (ref 0.0–0.4)
Alpha2 Glob SerPl Elph-Mcnc: 1.1 g/dL — ABNORMAL HIGH (ref 0.4–1.0)
B-Globulin SerPl Elph-Mcnc: 1.3 g/dL (ref 0.7–1.3)
Gamma Glob SerPl Elph-Mcnc: 1.6 g/dL (ref 0.4–1.8)
Globulin, Total: 4.6 g/dL — ABNORMAL HIGH (ref 2.2–3.9)
IgA: 377 mg/dL (ref 90–386)
IgG (Immunoglobin G), Serum: 1758 mg/dL — ABNORMAL HIGH (ref 603–1613)
IgM (Immunoglobulin M), Srm: 44 mg/dL (ref 20–172)
Total Protein ELP: 7.4 g/dL (ref 6.0–8.5)

## 2023-07-09 LAB — CBC
HCT: 25 % — ABNORMAL LOW (ref 39.0–52.0)
Hemoglobin: 8.4 g/dL — ABNORMAL LOW (ref 13.0–17.0)
MCH: 31.2 pg (ref 26.0–34.0)
MCHC: 33.6 g/dL (ref 30.0–36.0)
MCV: 92.9 fL (ref 80.0–100.0)
Platelets: 388 10*3/uL (ref 150–400)
RBC: 2.69 MIL/uL — ABNORMAL LOW (ref 4.22–5.81)
RDW: 13.6 % (ref 11.5–15.5)
WBC: 19.4 10*3/uL — ABNORMAL HIGH (ref 4.0–10.5)
nRBC: 0 % (ref 0.0–0.2)

## 2023-07-09 MED ORDER — CLOPIDOGREL BISULFATE 75 MG PO TABS
75.0000 mg | ORAL_TABLET | Freq: Every day | ORAL | 11 refills | Status: AC
  Filled 2023-07-09 – 2023-11-06 (×2): qty 30, 30d supply, fill #0
  Filled 2023-12-10: qty 30, 30d supply, fill #1
  Filled 2024-01-14: qty 30, 30d supply, fill #2
  Filled 2024-02-17: qty 30, 30d supply, fill #3
  Filled 2024-03-23: qty 30, 30d supply, fill #4
  Filled 2024-04-20: qty 30, 30d supply, fill #5
  Filled 2024-05-25: qty 30, 30d supply, fill #6
  Filled 2024-06-23 (×2): qty 30, 30d supply, fill #7

## 2023-07-09 MED ORDER — PANTOPRAZOLE SODIUM 40 MG PO TBEC
40.0000 mg | DELAYED_RELEASE_TABLET | Freq: Every day | ORAL | 2 refills | Status: AC
  Filled 2023-07-09 – 2023-09-10 (×2): qty 30, 30d supply, fill #0

## 2023-07-09 MED ORDER — CARVEDILOL 25 MG PO TABS
25.0000 mg | ORAL_TABLET | Freq: Two times a day (BID) | ORAL | 11 refills | Status: DC
  Filled 2023-07-09: qty 60, 30d supply, fill #0

## 2023-07-09 MED ORDER — ROSUVASTATIN CALCIUM 10 MG PO TABS
10.0000 mg | ORAL_TABLET | Freq: Every day | ORAL | 11 refills | Status: AC
Start: 2023-07-09 — End: 2024-07-08
  Filled 2023-07-09: qty 30, 30d supply, fill #0
  Filled 2023-08-22: qty 30, 30d supply, fill #1

## 2023-07-09 MED ORDER — BISACODYL 5 MG PO TBEC: 10.0000 mg | DELAYED_RELEASE_TABLET | Freq: Every day | ORAL | Status: DC | PRN

## 2023-07-09 MED ORDER — LEVETIRACETAM 500 MG PO TABS
500.0000 mg | ORAL_TABLET | ORAL | 11 refills | Status: AC
Start: 2023-07-10 — End: 2024-07-09
  Filled 2023-07-09: qty 12, 28d supply, fill #0
  Filled 2024-01-14 – 2024-05-15 (×2): qty 12, 28d supply, fill #1

## 2023-07-09 MED ORDER — AMOXICILLIN-POT CLAVULANATE 500-125 MG PO TABS
1.0000 | ORAL_TABLET | Freq: Every day | ORAL | 0 refills | Status: DC
  Filled 2023-07-09: qty 7, 7d supply, fill #0

## 2023-07-09 MED ORDER — ASPIRIN 81 MG PO TABS: 81.0000 mg | ORAL_TABLET | Freq: Every day | ORAL | 11 refills | Status: AC

## 2023-07-09 MED ORDER — BISACODYL 10 MG RE SUPP: 10.0000 mg | Freq: Every day | RECTAL | Status: DC | PRN

## 2023-07-09 MED ORDER — AMLODIPINE BESYLATE 10 MG PO TABS
10.0000 mg | ORAL_TABLET | Freq: Every evening | ORAL | 11 refills | Status: DC
Start: 2023-07-09 — End: 2023-07-16
  Filled 2023-07-09: qty 30, 30d supply, fill #0

## 2023-07-09 MED ORDER — LEVETIRACETAM 500 MG PO TABS
500.0000 mg | ORAL_TABLET | Freq: Two times a day (BID) | ORAL | 3 refills | Status: AC
Start: 2023-07-09 — End: 2024-07-14
  Filled 2023-07-09: qty 180, 90d supply, fill #0
  Filled 2024-01-14: qty 120, 60d supply, fill #1
  Filled 2024-05-15: qty 120, 60d supply, fill #2

## 2023-07-09 MED ORDER — VITAMIN D (ERGOCALCIFEROL) 1.25 MG (50000 UNIT) PO CAPS
50000.0000 [IU] | ORAL_CAPSULE | ORAL | 0 refills | Status: AC
  Filled 2023-07-09: qty 12, 84d supply, fill #0

## 2023-07-09 MED ORDER — LEVOFLOXACIN 500 MG PO TABS
500.0000 mg | ORAL_TABLET | ORAL | 0 refills | Status: AC
  Filled 2023-07-09: qty 4, 8d supply, fill #0

## 2023-07-09 NOTE — TOC Progression Note (Signed)
Transition of Care [facility]) - Progression Note    Patient Details  Name: Gary [name] MRN: [id number] Date of Birth: [birth year]  Transition of Care [facility]) CM/SW Contact  Garret Reddish, RN Phone Number: 07/09/2023, 2:09 PM  Clinical Narrative:     Meet with patient at bedside this morning.  While meeting with patient Nephrology team also at bedside to see patient.  Patient informs me that he needs to leave today.  He reports that he just start a job and needs to return to work and check on his home and pay his bills.    I informed Mr. Straus that [facility]'s Financial counselor was going to submit patient's Medicaid application today and let me know if she thought that he would qualify for Medicaid.    Nephrology team explained  that due to lack of insurance, patient is unable to be placed in outpatient dialysis clinic at this time. Patient has been informed of this. Patient is adamant to leave facility today, even if it means leaving AMA.  Nephrology team made it clear to patient that due to lack  to the lack of outpatient dialysis treatment, they where unable clear patient for discharge or removal of PermCath.  Patient reports that he has things that he has to take care of today and will be leaving today.   I have informed Mr. Gladwin that I would follow back up with him as soon as I learn of any additional information.  TOC will continue to follow for discharge planning.           Expected Discharge Plan and Services                                               Social Determinants of Health (SDOH) Interventions SDOH Screenings   Food Insecurity: No Food Insecurity (06/28/2023)  Housing: Low Risk  (06/28/2023)  Transportation Needs: No Transportation Needs (06/28/2023)  Utilities: Not At Risk (06/28/2023)  Alcohol Screen: Low Risk  (06/20/2023)  Depression (PHQ2-9): Low Risk  (06/20/2023)  Financial Resource Strain: Low Risk  (06/20/2023)  Physical Activity:  Sufficiently Active (06/20/2023)  Social Connections: Unknown (06/20/2023)  Stress: No Stress Concern Present (06/20/2023)  Tobacco Use: High Risk (06/28/2023)  Health Literacy: Adequate Health Literacy (06/20/2023)    Readmission Risk Interventions     No data to display

## 2023-07-09 NOTE — TOC Progression Note (Signed)
Transition of Care [facility]) - Progression Note    Patient Details  Name: Gary [name] MRN: [id number] Date of Birth: 02/28/1971  Transition of Care [facility]) CM/SW Contact  Garret Reddish, RN Phone Number: 07/09/2023, 2:19 PM  Clinical Narrative:     I spoke with Alfredo Martinez, Artist.  Alfredo Martinez informs me that she feels like patient will be able to qualify for Medicaid under the expansion regulation.  Alfredo Martinez reports that application was sent off today.    I have spoken with Marchelle Folks HD Coordinator.  She informs me that she has spoken with [facility].  The Dialysis center reports that until patient has prove of Medicaid they would not be able to accept patient at this time.    I have informed Dr. Lucianne Muss of the above information.    I have also informed patient of the above information.  I have informed patient that if he chooses to leave AMA that Nephrology team advised that he comes back to ED in the event that he gets sick and needs HD.  I also informed patient that if he has to come back to ED for HD and he has been approved for Medicaid to please let HD team know in the event he continues to need Dialysis.  Patient currently has Acute Kidney Injury and it has not yet been determined if he will be ESRD.    I have also made staff nurse aware of the above information.             Expected Discharge Plan and Services                                               Social Determinants of Health (SDOH) Interventions SDOH Screenings   Food Insecurity: No Food Insecurity (06/28/2023)  Housing: Low Risk  (06/28/2023)  Transportation Needs: No Transportation Needs (06/28/2023)  Utilities: Not At Risk (06/28/2023)  Alcohol Screen: Low Risk  (06/20/2023)  Depression (PHQ2-9): Low Risk  (06/20/2023)  Financial Resource Strain: Low Risk  (06/20/2023)  Physical Activity: Sufficiently Active (06/20/2023)  Social Connections: Unknown (06/20/2023)  Stress: No Stress  Concern Present (06/20/2023)  Tobacco Use: High Risk (06/28/2023)  Health Literacy: Adequate Health Literacy (06/20/2023)    Readmission Risk Interventions     No data to display

## 2023-07-09 NOTE — Progress Notes (Addendum)
[facility] Progress Note  Patient: Gary [name]    ZOX:[id number]  DOA: 06/28/2023     Date of Service: the patient was seen and examined on 07/09/2023  Chief Complaint  Patient presents with   Shortness of Breath   Brief hospital course: Mr. Lee Kuang is a 52 year old male with history of alcohol abuse, hypertension, hyperlipidemia, history of TIA, GERD, erectile dysfunction, tobacco use, history of seizure, who presents to the emergency department for chief concerns of shortness of breath and dizziness. 09/20: in ED, VSS, mild hyponatremia sodium 132, significantly elevated creatinine 4.18 w/ GFR 16, elevated LFT  AST 98 and ALT 76, Bili 1.9, ALP 156, BNP WNL, Troponin and CK WNL, WBC elevated 17.5, Hgb 9.1. CXR nonacute. Renal US normal no dilatation, Abd [place] (+)fatty liver, GB w/ diffuse wall thickening and focal nodular area measuring up to 19 by 7 x 5 mm concern for underlying lesion and advise compare w/ MRI. Given Protonix 80 mg IV one-time dose, sodium chloride 1 L bolus. Admitted to hospitalist service.  09/21: SOB improved, renal function worse Cr to 5.88, consulted to nephrology. Pt c/o scrotal pain, [place] (+)small bl hydrocele L varicocele L epididymal cyst - started tx for epididymitis given leukocytosis  09/22: early AM, scrotum apparently opened to drain - pt describes clear thin liquid, bandage appears serous fluid, no purulent, no erythema to skin - monitor. Cr worse, up to 8, nephrology following, continuing IV fluids.  09/23: Cr still worsening and may need to consider dialysis tomorrow, urology consult repeating US - no abscess 09/24: permcath placement and dialysis after that   Consultants:  Nephrology  Vascular surgery Urology    Procedures: none     Assessment and Plan: AKI (acute kidney injury) on CKD3a - worsening --> acute renal faillure/ESRD  suspect multifactorial including prerenal in setting of poor p.o./fluid intake, NSAID use,   glomerulonephritis, rhabdomyolysis, infection related AKI from epididymitis.  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 9/30 Hb 8.2 posttransfusion  Shortness of breath without respiratory failure - resolved  Seems likely to be respiratory compensation for metabolic acidosis d/t renal failure  Differentials would include COPD history of tobacco use  CXR personally reviewed, no concerns  Neg troponin, ACS unlikely     Gallbladder wall thickening, ruled out via MRCP  [place] RUQ mass - nodular 19 mm x 7 mm x 5 mm  Associated w/ elevation in Bili, AST, ALT, ALP On 9/25 d/w general surgery, recommended MRCP without contrast and it did show gallbladder wall thickening.  Patient was seen by general surgery, asymptomatic, recommended no intervention.  Pt was seen by Dr. Tonna Boehringer   Elevated liver enzymes, resolved, LFTs within normal range on 9/29 Suspect secondary to gallbladder disease as above, complicated by alcohol abuse  LFT trended down, wnl now and Hepatitis panel neg    History Seizures Calculated creatinine clearance is 13 mL/min on admission Patient on Keppra 1000 mg p.o. twice daily at home. Placed on Keppra reduced dosing  to 500 mg p.o. twice daily in setting of acute kidney injury 9/28 added Keppra 500 mg p.o. 3 times a week to supplement after hemodialysis   Scrotal pain w/Small bilateral hydrocele Left-sided varicocele, 8 mm left epididymal  cyst. Leukocytosis Ceftriaxone x1 + doxycycline 100 mg bid x10 days (FQ would cover enteric pathogens but pt not sexually active for some time, FQ renal dosing problematic, will give doxy) started 09/21 Check for GC/CT in urine --> neg  Frequent dressing changes Urology following 9/28 elevated WBC count, added Augmentin 500 mg p.o. daily renal dose Repeat US scrotum No abscess/fluid collection, soft tissue edema 9/29 WBC count elevated, discontinue doxycycline, continued Augmentin and added Levaquin 750 mg x 1 dose followed by 500 mg q48 hr x 5 doses WBC count is still elevated, but cellulitis is improving Trend WBC count daily   Hyponatremia -resolved Likely d/t renal failure  Follow BMP   Metabolic acidosis, resolved Mild, secondary to acute kidney injury Sodium bicarbonate 325 mg p.o. twice daily, 2 doses ordered 9/25-sodium bicarbonate 650 mg p.o. 3 times daily order placed 9/27 decreased sodium bicarb 650 p.o. twice daily, DC'd bicarb on 9/28  History CVA Statin held for now given elevation LFT  Continued Plavix    HLD  04/25/2023, cardiologist discontinued Pravachol and started patient on Crestor 40 mg w/ improvement in cholesterol and AST/ALT.  Held statin due to elevated LFT, which improved. 9/30 resumed Crestor 20 mg p.o. daily at lower dose, but to reduce to Crestor 10 mg on discharge which is the maximum dose for hemodialysis patients    Essential HTN On home amlodipine, carvedilol  Use hydralazine as needed Monitor BP and titrate medications accordingly  GERD  PPI   Insomnia Melatonin 5 mg nightly as needed for sleep   Tobacco abuse As needed nicotine patch ordered   Vitamin D Insufficiency: started vitamin D 50,000 units p.o. weekly, follow with PCP to repeat vitamin D level after 3 to 6 months.  Body mass index is 28.79 kg/m.  Interventions:  Diet: Renal diet DVT Prophylaxis: Subcutaneous Heparin    Advance goals of care discussion: Full  code  Family Communication: family was not present at bedside, at the time of interview.  The pt provided permission to discuss medical plan with the family. Opportunity was given to ask question and all questions were answered satisfactorily.   Disposition:  Pt is from Home, admitted with AKI, developed ESRD, started new hemodialysis, scrotal infection on antibiotics, which precludes a safe discharge. Discharge to Home, when stable, gradually improving, patient can be discharged home tomorrow if outpatient dialysis set up has been done.  Follow TOC for disposition plan Patient is uninsured 10/1 patient would like to leave AMA tomorrow a.m. after hemodialysis.  Med rec is done and prescription sent to pharmacy.   Subjective: No significant events overnight, patient was lying comfortably, stated that the scrotal swelling is getting better, no pain. Patient is frustrated due to having no insurance and staying longer in the hospital.  Patient would like to leave AMA tomorrow a.m. after hemodialysis. All the prescriptions has been sent to [facility] pharmacy. Patient can be released AMA after hemodialysis tomorrow a.m.  Physical Exam: General: NAD, lying comfortably Appear in no distress, affect appropriate Eyes: PERRLA ENT: Oral Mucosa Clear, moist  Neck: no JVD,  Cardiovascular: S1 and S2 Present, no Murmur,  Respiratory: good respiratory effort, Bilateral Air entry equal and Decreased, no Crackles, no wheezes Abdomen: Bowel Sound present, Soft and no tenderness,  right testicular swelling, mild erythema and drainage Skin: no rashes Extremities: no Pedal edema, no calf tenderness Neurologic: without any new focal findings Gait not checked due to patient safety concerns  Vitals:   07/08/23 1515 07/08/23 2315 07/09/23 0808 07/09/23 1537  BP: 120/69 124/74 139/80 111/75  Pulse: 87 72 77 91  Resp: 14 18 14 14   Temp: 98.3 F (36.8 C) 98.9 F (37.2 C) 98.2 F (36.8 C) 97.8 F (36.6 C)   TempSrc:      SpO2: 97% 91% 98% 100%  Weight:      Height:        Intake/Output Summary (Last 24 hours) at 07/09/2023 1539 Last data filed at 07/09/2023 1209 Gross per 24 hour  Intake 240 ml  Output --  Net 240 ml   Filed Weights   07/05/23 1229 07/08/23 0750 07/08/23 1204  Weight: 97.6 kg 92 kg 91 kg    Data Reviewed: I have personally reviewed and interpreted daily labs, tele strips, imagings as discussed above. I reviewed all nursing notes, pharmacy notes, vitals, pertinent old records I have discussed plan of care as described above with RN and patient/family.  CBC: Recent Labs  Lab 07/05/23 0622 07/06/23 0335 07/07/23 0309 07/08/23 0536 07/09/23 0435  WBC 14.9* 15.3* 18.0* 17.4* 19.4*  HGB 7.3* 7.1* 7.0* 8.2* 8.4*  HCT 21.4* 20.7* 20.2* 23.8* 25.0*  MCV 92.6 93.2 91.4 90.2 92.9  PLT 253 241 322 348 388   Basic Metabolic Panel: Recent Labs  Lab 07/04/23 0309 07/05/23 0622 07/06/23 0335 07/07/23 0309 07/08/23 0755  NA 137 140 137 138 139  K 3.7 3.5 3.4* 3.6 3.5  CL 109 105 98 103 105  CO2 19* 23 26 25 22   GLUCOSE 98 93 78 103* 105*  BUN 65* 46* 31* 38* 41*  CREATININE 7.94* 6.31* 4.84* 5.38* 4.80*  CALCIUM 7.9* 8.0* 8.1* 8.6* 8.9  MG 1.7 1.6* 1.6*  --   --   PHOS 4.7* 4.4 4.1  --  4.1    Studies: No results found.  Scheduled Meds:  amLODipine  10 mg Oral QPM   amoxicillin-clavulanate  1 tablet Oral Daily   aspirin EC  81 mg Oral Daily   carvedilol  25 mg Oral BID WC   Chlorhexidine Gluconate Cloth  6 each Topical Q0600   clopidogrel  75 mg Oral Daily   folic acid  1 mg Oral Daily   heparin  5,000 Units Subcutaneous Q8H   levETIRAcetam  500 mg Oral BID   levETIRAcetam  500 mg Oral Once per day on Monday Wednesday Friday   levofloxacin  500 mg Oral Q48H   nystatin   Topical BID   pantoprazole  40 mg Oral Daily   rosuvastatin  20 mg Oral QHS   thiamine  100 mg Oral Daily   Vitamin D (Ergocalciferol)  50,000 Units Oral Q7 days   Continuous  Infusions:   PRN Meds: acetaminophen, bisacodyl, bisacodyl, fentaNYL (SUBLIMAZE) injection, guaiFENesin-dextromethorphan, hydrALAZINE **OR** hydrALAZINE, LORazepam, melatonin, nicotine, ondansetron (ZOFRAN) IV  Time spent: 55 minutes  Author: Gillis Santa. MD [facility] 07/09/2023 3:39 PM  To reach On-call, see care teams to locate the attending and reach out to them via [email or web address]. If 7PM-7AM, please contact night-coverage If you still have difficulty reaching the attending provider, please page the [facility] (Director on Call) for [facility] on amion for assistance.

## 2023-07-09 NOTE — Progress Notes (Signed)
[facility]  ROUNDING NOTE   Subjective:   Patient seen laying in bed Alert and oriented Appears restless States he is anxious for discharge, "I need to return to work"   Objective:  Vital signs in last 24 hours:  Temp:  [98.2 F (36.8 C)-98.9 F (37.2 C)] 98.2 F (36.8 C) (10/01 0808) Pulse Rate:  [72-87] 77 (10/01 0808) Resp:  [14-18] 14 (10/01 0808) BP: (120-139)/(69-80) 139/80 (10/01 0808) SpO2:  [91 %-98 %] 98 % (10/01 0808)  Weight change:  Filed Weights   07/05/23 1229 07/08/23 0750 07/08/23 1204  Weight: 97.6 kg 92 kg 91 kg    Intake/Output: I/O last 3 completed shifts: In: 0  Out: 500 [Other:500]   Intake/Output this shift:  Total I/O In: 240 [P.O.:240] Out: -   Physical Exam: General: NAD  Head: Normocephalic, atraumatic. Moist oral mucosal membranes  Eyes: Anicteric  Lungs:  Clear to auscultation normal effort  Heart: Regular rate and rhythm  Abdomen:  Soft, nontender  Extremities: trace peripheral edema.  Neurologic: Alert, moving all four extremities  Skin: No lesions.  Scrotal edema and tenderness (improving)  Access: Rt chest permcath placed on 07/02/23 by Dr Wyn Quaker    Basic Metabolic Panel: Recent Labs  Lab 07/04/23 0309 07/05/23 0622 07/06/23 0335 07/07/23 0309 07/08/23 0755  NA 137 140 137 138 139  K 3.7 3.5 3.4* 3.6 3.5  CL 109 105 98 103 105  CO2 19* 23 26 25 22   GLUCOSE 98 93 78 103* 105*  BUN 65* 46* 31* 38* 41*  CREATININE 7.94* 6.31* 4.84* 5.38* 4.80*  CALCIUM 7.9* 8.0* 8.1* 8.6* 8.9  MG 1.7 1.6* 1.6*  --   --   PHOS 4.7* 4.4 4.1  --  4.1    Liver Function Tests: Recent Labs  Lab 07/07/23 0309 07/08/23 0755  AST 29  --   ALT 23  --   ALKPHOS 122  --   BILITOT 0.9  --   PROT 7.4  --   ALBUMIN 2.9* 3.1*   No results for input(s): "LIPASE", "AMYLASE" in the last 168 hours. No results for input(s): "AMMONIA" in the last 168 hours.  CBC: Recent Labs  Lab 07/05/23 0622 07/06/23 0335 07/07/23 0309  07/08/23 0536 07/09/23 0435  WBC 14.9* 15.3* 18.0* 17.4* 19.4*  HGB 7.3* 7.1* 7.0* 8.2* 8.4*  HCT 21.4* 20.7* 20.2* 23.8* 25.0*  MCV 92.6 93.2 91.4 90.2 92.9  PLT 253 241 322 348 388    Cardiac Enzymes: No results for input(s): "CKTOTAL", "CKMB", "CKMBINDEX", "TROPONINI" in the last 168 hours.   BNP: Invalid input(s): "POCBNP"  CBG: No results for input(s): "GLUCAP" in the last 168 hours.  Microbiology: Results for orders placed or performed during the hospital encounter of 06/28/23  Blood culture (single)     Status: None   Collection Time: 06/28/23 12:22 PM   Specimen: BLOOD  Result Value Ref Range Status   Specimen Description BLOOD LEFT ANTECUBITAL  Final   Special Requests   Final    BOTTLES DRAWN AEROBIC AND ANAEROBIC Blood Culture adequate volume   Culture   Final    NO GROWTH 5 DAYS Performed at [facility], [address], [place], [place] [address]    Report Status 07/03/2023 FINAL  Final  SARS Coronavirus 2 by RT PCR (hospital order, performed in [facility] hospital lab) *cepheid single result test* Anterior Nasal Swab     Status: None   Collection Time: 06/29/23 12:15 AM  Specimen: Anterior Nasal Swab  Result Value Ref Range Status   SARS Coronavirus 2 by RT PCR NEGATIVE NEGATIVE Final    Comment: (NOTE) SARS-CoV-2 target nucleic acids are NOT DETECTED.  The SARS-CoV-2 RNA is generally detectable in upper and lower respiratory specimens during the acute phase of infection. The lowest concentration of SARS-CoV-2 viral copies this assay can detect is 250 copies / mL. A negative result does not preclude SARS-CoV-2 infection and should not be used as the sole basis for treatment or other patient management decisions.  A negative result may occur with improper specimen collection / handling, submission of specimen other than nasopharyngeal swab, presence of viral mutation(s) within the areas targeted by this assay, and inadequate number of viral  copies (<250 copies / mL). A negative result must be combined with clinical observations, patient history, and epidemiological information.  Fact Sheet for Patients:   RoadLapTop.co.za  Fact Sheet for Healthcare Providers: http://kim-miller.com/  This test is not yet approved or  cleared by the [place] FDA and has been authorized for detection and/or diagnosis of SARS-CoV-2 by FDA under an Emergency Use Authorization (EUA).  This EUA will remain in effect (meaning this test can be used) for the duration of the COVID-19 declaration under Section 564(b)(1) of the Act, 21 U.S.C. section 360bbb-3(b)(1), unless the authorization is terminated or revoked sooner.  Performed at [facility], [address] [place], [place] [address]   Chlamydia/NGC rt PCR [facility] only)     Status: None   Collection Time: 06/29/23 10:42 AM   Specimen: Urine; GU  Result Value Ref Range Status   Specimen source GC/Chlam URINE, RANDOM  Final   Chlamydia Tr NOT DETECTED NOT DETECTED Final   N gonorrhoeae NOT DETECTED NOT DETECTED Final    Comment: (NOTE) This CT/NG assay has not been evaluated in patients with a history of  hysterectomy. Performed at [facility], [address] [place], [place] [address]     Coagulation Studies: No results for input(s): "LABPROT", "INR" in the last 72 hours.   Urinalysis: No results for input(s): "COLORURINE", "LABSPEC", "PHURINE", "GLUCOSEU", "HGBUR", "BILIRUBINUR", "KETONESUR", "PROTEINUR", "UROBILINOGEN", "NITRITE", "LEUKOCYTESUR" in the last 72 hours.  Invalid input(s): "APPERANCEUR"     Imaging: No results found.   Medications:      amLODipine  10 mg Oral QPM   amoxicillin-clavulanate  1 tablet Oral Daily   aspirin EC  81 mg Oral Daily   carvedilol  25 mg Oral BID WC   Chlorhexidine Gluconate Cloth  6 each Topical Q0600   clopidogrel  75 mg Oral Daily   folic acid  1 mg Oral  Daily   heparin  5,000 Units Subcutaneous Q8H   levETIRAcetam  500 mg Oral BID   levETIRAcetam  500 mg Oral Once per day on Monday Wednesday Friday   levofloxacin  500 mg Oral Q48H   nystatin   Topical BID   pantoprazole  40 mg Oral Daily   rosuvastatin  20 mg Oral QHS   thiamine  100 mg Oral Daily   Vitamin D (Ergocalciferol)  50,000 Units Oral Q7 days   acetaminophen, bisacodyl, bisacodyl, fentaNYL (SUBLIMAZE) injection, guaiFENesin-dextromethorphan, hydrALAZINE **OR** hydrALAZINE, LORazepam, melatonin, nicotine, ondansetron (ZOFRAN) IV  Assessment/ Plan:  [name] is a 52 y.o.  male with medical problems of hypertension, alcohol abuse, hyperlipidemia, history of TIA, GERD, erectile dysfunction, tobacco abuse, history of seizures  was admitted on 06/28/2023 for Shortness of breath [R06.02] SOB (shortness of breath) [  R06.02] AKI (acute kidney injury) (HCC) [N17.9] Anemia, unspecified type [D64.9]   Acute kidney injury on chronic kidney disease stage IIIa/proteinuria. AKI secondary to severe ATN with chronic NSAID use HTN Proteinuria. Elevated liver enzymes Hypoalbuminemia Fatty liver Alcohol abuse Current smoker Scrotal Abscess  Lab Results  Component Value Date   CREATININE 4.80 (H) 07/08/2023   CREATININE 5.38 (H) 07/07/2023   CREATININE 4.84 (H) 07/06/2023    Intake/Output Summary (Last 24 hours) at 07/09/2023 1302 Last data filed at 07/09/2023 1209 Gross per 24 hour  Intake 240 ml  Output --  Net 240 ml   Baseline creatinine of 1.61, GFR 51 from 06/13/2023.  Presenting creatinine of 4.18, GFR 16.  In addition patient has elevated liver enzymes suggesting hepatitis possibly alcoholic hepatitis.  Albumin is low.  Diffuse echogenic liver consistent with fatty infiltration. Urinalysis with moderate blood, 0-5 RBCs, 0-5 WBCs, 100 mg of protein. Renal ultrasound is negative for obstruction, mass.   Differential diagnosis includes nonsteroidals induced AKI, FSGS,  glomerulonephritis, infection   Protein creatinine ratio 2.23, CK 106, UDS negative Negative ANA/ANCA   Plan: Patient received dialysis treatment yesterday, tolerated treatment well.  Due to lack of insurance, patient is unable to be placed in outpatient dialysis clinic at this time.  Patient has been informed of this.  Other financial avenues are being assessed however patient adamant to leave facility today, even if it includes AMA.  Due to the lack of outpatient dialysis treatment, we cannot clear patient for discharge or removal of PermCath.  Will defer to primary team.  Patient informed to keep PermCath dry and to return to emergency department if experiencing shortness of breath or edema.  WBCs remain elevated.    LOS: 11 Gissele Narducci 10/1/20241:02 PM

## 2023-07-09 NOTE — Plan of Care (Signed)
  Problem: Coping: Goal: Will verbalize positive feelings about self Outcome: Progressing Goal: Will identify appropriate support needs Outcome: Progressing   Problem: Self-Care: Goal: Ability to participate in self-care as condition permits will improve Outcome: Progressing Goal: Verbalization of feelings and concerns over difficulty with self-care will improve Outcome: Progressing Goal: Ability to communicate needs accurately will improve Outcome: Progressing   Problem: Nutrition: Goal: Dietary intake will improve Outcome: Progressing   Problem: Education: Goal: Knowledge of General Education information will improve Description: Including pain rating scale, medication(s)/side effects and non-pharmacologic comfort measures Outcome: Progressing   Problem: Health Behavior/Discharge Planning: Goal: Ability to manage health-related needs will improve Outcome: Progressing   Problem: Activity: Goal: Risk for activity intolerance will decrease Outcome: Progressing   Problem: Pain Managment: Goal: General experience of comfort will improve Outcome: Progressing   Problem: Skin Integrity: Goal: Risk for impaired skin integrity will decrease Outcome: Progressing

## 2023-07-09 NOTE — Progress Notes (Addendum)
Pt notified this nurse, a family member is coming to visit. Pt expressed he wants to go outside for a breathe of fresh air, but is waiting on a family member to go with him. This nurse reiterated pt cannot leave the unit/floor with a family member. This is a smoke free campus.If patient wished to still go outside, then it must be with a staff member. Pt's advocate staff offered to take pt outside. Pt refused at this time. Notified attending MD.

## 2023-07-09 NOTE — Progress Notes (Signed)
Nurse tech informed this nurse, pt is nowhere to be found in his room. Notified charge nurse, security, and Armed forces technical officer. Nursing staff found pt at the visitor parking lot. Nursing staff and security walked pt back to his room. MD made aware.

## 2023-07-10 ENCOUNTER — Other Ambulatory Visit: Payer: Self-pay

## 2023-07-10 DIAGNOSIS — E782 Mixed hyperlipidemia: Secondary | ICD-10-CM

## 2023-07-10 DIAGNOSIS — K828 Other specified diseases of gallbladder: Secondary | ICD-10-CM

## 2023-07-10 DIAGNOSIS — R739 Hyperglycemia, unspecified: Secondary | ICD-10-CM

## 2023-07-10 DIAGNOSIS — D72829 Elevated white blood cell count, unspecified: Secondary | ICD-10-CM

## 2023-07-10 DIAGNOSIS — E871 Hypo-osmolality and hyponatremia: Secondary | ICD-10-CM

## 2023-07-10 DIAGNOSIS — R748 Abnormal levels of other serum enzymes: Secondary | ICD-10-CM

## 2023-07-10 DIAGNOSIS — R569 Unspecified convulsions: Secondary | ICD-10-CM

## 2023-07-10 DIAGNOSIS — F1010 Alcohol abuse, uncomplicated: Secondary | ICD-10-CM

## 2023-07-10 DIAGNOSIS — E872 Acidosis, unspecified: Secondary | ICD-10-CM

## 2023-07-10 DIAGNOSIS — Z720 Tobacco use: Secondary | ICD-10-CM

## 2023-07-10 LAB — RENAL FUNCTION PANEL
Albumin: 3.3 g/dL — ABNORMAL LOW (ref 3.5–5.0)
Anion gap: 8 (ref 5–15)
BUN: 48 mg/dL — ABNORMAL HIGH (ref 6–20)
CO2: 23 mmol/L (ref 22–32)
Calcium: 9.1 mg/dL (ref 8.9–10.3)
Chloride: 102 mmol/L (ref 98–111)
Creatinine, Ser: 4.45 mg/dL — ABNORMAL HIGH (ref 0.61–1.24)
GFR, Estimated: 15 mL/min — ABNORMAL LOW (ref >60)
Glucose, Bld: 98 mg/dL (ref 70–99)
Phosphorus: 4.8 mg/dL — ABNORMAL HIGH (ref 2.5–4.6)
Potassium: 3.7 mmol/L (ref 3.5–5.1)
Sodium: 133 mmol/L — ABNORMAL LOW (ref 135–145)

## 2023-07-10 LAB — CBC
HCT: 24.1 % — ABNORMAL LOW (ref 39.0–52.0)
Hemoglobin: 8.3 g/dL — ABNORMAL LOW (ref 13.0–17.0)
MCH: 31.4 pg (ref 26.0–34.0)
MCHC: 34.4 g/dL (ref 30.0–36.0)
MCV: 91.3 fL (ref 80.0–100.0)
Platelets: 419 10*3/uL — ABNORMAL HIGH (ref 150–400)
RBC: 2.64 MIL/uL — ABNORMAL LOW (ref 4.22–5.81)
RDW: 13.6 % (ref 11.5–15.5)
WBC: 19 10*3/uL — ABNORMAL HIGH (ref 4.0–10.5)
nRBC: 0 % (ref 0.0–0.2)

## 2023-07-10 MED ORDER — ALTEPLASE 2 MG IJ SOLR: 2.0000 mg | Freq: Once | INTRAMUSCULAR | Status: DC | PRN

## 2023-07-10 MED ORDER — HEPARIN SODIUM (PORCINE) 1000 UNIT/ML DIALYSIS
1000.0000 [IU] | INTRAMUSCULAR | Status: DC | PRN
  Administered 2023-07-10: 4200 [IU]

## 2023-07-10 MED ORDER — HEPARIN SODIUM (PORCINE) 1000 UNIT/ML IJ SOLN
INTRAMUSCULAR | Status: AC
  Filled 2023-07-10: qty 10

## 2023-07-10 NOTE — Plan of Care (Signed)
  Problem: Education: Goal: Knowledge of disease or condition will improve Outcome: Progressing Goal: Knowledge of secondary prevention will improve (MUST DOCUMENT ALL) Outcome: Progressing Goal: Knowledge of patient specific risk factors will improve Loraine Leriche N/A or DELETE if not current risk factor) Outcome: Progressing   Problem: Ischemic Stroke/TIA Tissue Perfusion: Goal: Complications of ischemic stroke/TIA will be minimized Outcome: Progressing   Problem: Coping: Goal: Will verbalize positive feelings about self Outcome: Progressing Goal: Will identify appropriate support needs Outcome: Progressing   Problem: Health Behavior/Discharge Planning: Goal: Ability to manage health-related needs will improve Outcome: Progressing Goal: Goals will be collaboratively established with patient/family Outcome: Progressing   Problem: Self-Care: Goal: Ability to participate in self-care as condition permits will improve Outcome: Progressing Goal: Verbalization of feelings and concerns over difficulty with self-care will improve Outcome: Progressing Goal: Ability to communicate needs accurately will improve Outcome: Progressing   Problem: Nutrition: Goal: Dietary intake will improve Outcome: Progressing   Problem: Education: Goal: Knowledge of General Education information will improve Description: Including pain rating scale, medication(s)/side effects and non-pharmacologic comfort measures Outcome: Progressing   Problem: Health Behavior/Discharge Planning: Goal: Ability to manage health-related needs will improve Outcome: Progressing   Problem: Clinical Measurements: Goal: Ability to maintain clinical measurements within normal limits will improve Outcome: Progressing Goal: Will remain free from infection Outcome: Progressing Goal: Diagnostic test results will improve Outcome: Progressing Goal: Respiratory complications will improve Outcome: Progressing Goal: Cardiovascular  complication will be avoided Outcome: Progressing   Problem: Activity: Goal: Risk for activity intolerance will decrease Outcome: Progressing   Problem: Nutrition: Goal: Adequate nutrition will be maintained Outcome: Progressing   Problem: Coping: Goal: Level of anxiety will decrease Outcome: Progressing   Problem: Elimination: Goal: Will not experience complications related to bowel motility Outcome: Progressing Goal: Will not experience complications related to urinary retention Outcome: Progressing   Problem: Pain Managment: Goal: General experience of comfort will improve Outcome: Progressing   Problem: Safety: Goal: Ability to remain free from injury will improve Outcome: Progressing   Problem: Skin Integrity: Goal: Risk for impaired skin integrity will decrease Outcome: Progressing

## 2023-07-10 NOTE — Progress Notes (Signed)
[facility]  ROUNDING NOTE   Subjective:   Patient seen and evaluated during dialysis   HEMODIALYSIS FLOWSHEET:  Blood Flow Rate (mL/min): 0 mL/min Arterial Pressure (mmHg): -39.79 mmHg Venous Pressure (mmHg): 44.64 mmHg TMP (mmHg): 15.15 mmHg Ultrafiltration Rate (mL/min): 412 mL/min Dialysate Flow Rate (mL/min): 300 ml/min  Tolerating treatment well.  No complaints Continues to state he is leaving today.   Objective:  Vital signs in last 24 hours:  Temp:  [97.8 F (36.6 C)-98.9 F (37.2 C)] 98.8 F (37.1 C) (10/02 1217) Pulse Rate:  [66-91] 74 (10/02 1217) Resp:  [14-20] 20 (10/02 1217) BP: (111-148)/(75-92) 148/87 (10/02 1217) SpO2:  [99 %-100 %] 100 % (10/02 1217) Weight:  [90.7 kg] 90.7 kg (10/02 0822)  Weight change:  Filed Weights   07/08/23 0750 07/08/23 1204 07/10/23 0822  Weight: 92 kg 91 kg 90.7 kg    Intake/Output: I/O last 3 completed shifts: In: 240 [P.O.:240] Out: -    Intake/Output this shift:  Total I/O In: -  Out: 500 [Other:500]  Physical Exam: General: NAD  Head: Normocephalic, atraumatic. Moist oral mucosal membranes  Eyes: Anicteric  Lungs:  Clear to auscultation normal effort  Heart: Regular rate and rhythm  Abdomen:  Soft, nontender  Extremities: No peripheral edema.  Neurologic: Alert, moving all four extremities  Skin: No lesions.  Scrotal edema (improving)  Access: Rt chest permcath placed on 07/02/23 by Dr Wyn Quaker    Basic Metabolic Panel: Recent Labs  Lab 07/04/23 0309 07/05/23 0622 07/06/23 0335 07/07/23 0309 07/08/23 0755 07/10/23 0851  NA 137 140 137 138 139 133*  K 3.7 3.5 3.4* 3.6 3.5 3.7  CL 109 105 98 103 105 102  CO2 19* 23 26 25 22 23   GLUCOSE 98 93 78 103* 105* 98  BUN 65* 46* 31* 38* 41* 48*  CREATININE 7.94* 6.31* 4.84* 5.38* 4.80* 4.45*  CALCIUM 7.9* 8.0* 8.1* 8.6* 8.9 9.1  MG 1.7 1.6* 1.6*  --   --   --   PHOS 4.7* 4.4 4.1  --  4.1 4.8*    Liver Function Tests: Recent Labs  Lab  07/07/23 0309 07/08/23 0755 07/10/23 0851  AST 29  --   --   ALT 23  --   --   ALKPHOS 122  --   --   BILITOT 0.9  --   --   PROT 7.4  --   --   ALBUMIN 2.9* 3.1* 3.3*   No results for input(s): "LIPASE", "AMYLASE" in the last 168 hours. No results for input(s): "AMMONIA" in the last 168 hours.  CBC: Recent Labs  Lab 07/06/23 0335 07/07/23 0309 07/08/23 0536 07/09/23 0435 07/10/23 0501  WBC 15.3* 18.0* 17.4* 19.4* 19.0*  HGB 7.1* 7.0* 8.2* 8.4* 8.3*  HCT 20.7* 20.2* 23.8* 25.0* 24.1*  MCV 93.2 91.4 90.2 92.9 91.3  PLT 241 322 348 388 419*    Cardiac Enzymes: No results for input(s): "CKTOTAL", "CKMB", "CKMBINDEX", "TROPONINI" in the last 168 hours.   BNP: Invalid input(s): "POCBNP"  CBG: No results for input(s): "GLUCAP" in the last 168 hours.  Microbiology: Results for orders placed or performed during the hospital encounter of 06/28/23  Blood culture (single)     Status: None   Collection Time: 06/28/23 12:22 PM   Specimen: BLOOD  Result Value Ref Range Status   Specimen Description BLOOD LEFT ANTECUBITAL  Final   Special Requests   Final    BOTTLES DRAWN AEROBIC AND ANAEROBIC Blood Culture adequate  volume   Culture   Final    NO GROWTH 5 DAYS Performed at [facility], [address] [place], [place] [address]    Report Status 07/03/2023 FINAL  Final  SARS Coronavirus 2 by RT PCR (hospital order, performed in [facility] hospital lab) *cepheid single result test* Anterior Nasal Swab     Status: None   Collection Time: 06/29/23 12:15 AM   Specimen: Anterior Nasal Swab  Result Value Ref Range Status   SARS Coronavirus 2 by RT PCR NEGATIVE NEGATIVE Final    Comment: (NOTE) SARS-CoV-2 target nucleic acids are NOT DETECTED.  The SARS-CoV-2 RNA is generally detectable in upper and lower respiratory specimens during the acute phase of infection. The lowest concentration of SARS-CoV-2 viral copies this assay can detect is 250 copies / mL. A  negative result does not preclude SARS-CoV-2 infection and should not be used as the sole basis for treatment or other patient management decisions.  A negative result may occur with improper specimen collection / handling, submission of specimen other than nasopharyngeal swab, presence of viral mutation(s) within the areas targeted by this assay, and inadequate number of viral copies (<250 copies / mL). A negative result must be combined with clinical observations, patient history, and epidemiological information.  Fact Sheet for Patients:   RoadLapTop.co.za  Fact Sheet for Healthcare Providers: http://kim-miller.com/  This test is not yet approved or  cleared by the [place] FDA and has been authorized for detection and/or diagnosis of SARS-CoV-2 by FDA under an Emergency Use Authorization (EUA).  This EUA will remain in effect (meaning this test can be used) for the duration of the COVID-19 declaration under Section 564(b)(1) of the Act, 21 U.S.C. section 360bbb-3(b)(1), unless the authorization is terminated or revoked sooner.  Performed at [facility], [address] [place], [place] [address]   Chlamydia/NGC rt PCR [facility] only)     Status: None   Collection Time: 06/29/23 10:42 AM   Specimen: Urine; GU  Result Value Ref Range Status   Specimen source GC/Chlam URINE, RANDOM  Final   Chlamydia Tr NOT DETECTED NOT DETECTED Final   N gonorrhoeae NOT DETECTED NOT DETECTED Final    Comment: (NOTE) This CT/NG assay has not been evaluated in patients with a history of  hysterectomy. Performed at [facility], [address] [place], [place] [address]     Coagulation Studies: No results for input(s): "LABPROT", "INR" in the last 72 hours.   Urinalysis: No results for input(s): "COLORURINE", "LABSPEC", "PHURINE", "GLUCOSEU", "HGBUR", "BILIRUBINUR", "KETONESUR", "PROTEINUR", "UROBILINOGEN", "NITRITE",  "LEUKOCYTESUR" in the last 72 hours.  Invalid input(s): "APPERANCEUR"     Imaging: No results found.   Medications:      amLODipine  10 mg Oral QPM   amoxicillin-clavulanate  1 tablet Oral Daily   aspirin EC  81 mg Oral Daily   carvedilol  25 mg Oral BID WC   Chlorhexidine Gluconate Cloth  6 each Topical Q0600   clopidogrel  75 mg Oral Daily   folic acid  1 mg Oral Daily   heparin  5,000 Units Subcutaneous Q8H   levETIRAcetam  500 mg Oral BID   levETIRAcetam  500 mg Oral Once per day on Monday Wednesday Friday   levofloxacin  500 mg Oral Q48H   nystatin   Topical BID   pantoprazole  40 mg Oral Daily   rosuvastatin  20 mg Oral QHS   thiamine  100 mg Oral Daily   Vitamin  D (Ergocalciferol)  50,000 Units Oral Q7 days   acetaminophen, alteplase, bisacodyl, bisacodyl, fentaNYL (SUBLIMAZE) injection, guaiFENesin-dextromethorphan, heparin, hydrALAZINE **OR** hydrALAZINE, LORazepam, melatonin, nicotine, ondansetron (ZOFRAN) IV  Assessment/ Plan:  [name] is a 52 y.o.  male with medical problems of hypertension, alcohol abuse, hyperlipidemia, history of TIA, GERD, erectile dysfunction, tobacco abuse, history of seizures  was admitted on 06/28/2023 for Shortness of breath [R06.02] SOB (shortness of breath) [R06.02] AKI (acute kidney injury) (HCC) [N17.9] Anemia, unspecified type [D64.9]   Acute kidney injury on chronic kidney disease stage IIIa/proteinuria. AKI secondary to severe ATN with chronic NSAID use HTN Proteinuria. Elevated liver enzymes Hypoalbuminemia Fatty liver Alcohol abuse Current smoker Scrotal Abscess  Lab Results  Component Value Date   CREATININE 4.45 (H) 07/10/2023   CREATININE 4.80 (H) 07/08/2023   CREATININE 5.38 (H) 07/07/2023    Intake/Output Summary (Last 24 hours) at 07/10/2023 1236 Last data filed at 07/10/2023 1217 Gross per 24 hour  Intake --  Output 500 ml  Net -500 ml   Baseline creatinine of 1.61, GFR 51 from 06/13/2023.   Presenting creatinine of 4.18, GFR 16.  In addition patient has elevated liver enzymes suggesting hepatitis possibly alcoholic hepatitis.  Albumin is low.  Diffuse echogenic liver consistent with fatty infiltration. Urinalysis with moderate blood, 0-5 RBCs, 0-5 WBCs, 100 mg of protein. Renal ultrasound is negative for obstruction, mass.   Differential diagnosis includes nonsteroidals induced AKI, FSGS, glomerulonephritis, infection   Protein creatinine ratio 2.23, CK 106, UDS negative Negative ANA/ANCA   Plan: Dialysis received today, UF 0.5L as tolerated. Patient continues to make urine. States he has to return to work. Due to lack of insurance, we are unable to place him at outpatient dialysis clinic. This was discussed with patient and he understands he will have to sign out against medical advice if he wishes to leave. He is instructed to return to the emergency department if he has any symptoms of shortness of breath. White count remains elevated.     LOS: 12 Baldemar Dady 10/2/202412:36 PM

## 2023-07-10 NOTE — Progress Notes (Signed)
  Received patient in bed to unit.   Informed consent signed and in chart.    TX duration: 3.5 hrs      Hand-off given to patient's nurse.    Access used: R CVC Access issues: None   Total UF removed: 500 mL Medication(s) given: None Post HD VS: WDL Post HD weight: 89.9 kg      [facility] Unit

## 2023-07-10 NOTE — Discharge Summary (Signed)
Physician AMA Discharge Summary   Patient: Gary [name] MRN: [id number] DOB: Jul 20, 1971  Admit date:     06/28/2023  Discharge date: {dischdate:26783}  Discharge Physician: Marcelino Duster   PCP: Rolm Gala, NP   Recommendations at discharge:  {Tip this will not be part of the note when signed- Example include specific recommendations for outpatient follow-up, pending tests to follow-up on. (Optional):26781}  ***  Discharge Diagnoses: Principal Problem:   AKI (acute kidney injury) (HCC) Active Problems:   Elevated liver enzymes   Gallbladder mass   Seizures (HCC)   Alcohol abuse   Hyperlipidemia   Hyperglycemia   Tobacco abuse   Insomnia   Shortness of breath   Metabolic acidosis   Hyponatremia   Leukocytosis   Encounter regarding vascular access for dialysis for end-stage renal disease (HCC)  Resolved Problems:   * No resolved hospital problems. [facility] Course: Gary [name] is a 52 year old male with history of alcohol abuse, hypertension, hyperlipidemia, history of TIA, GERD, erectile dysfunction, tobacco use, history of seizure, who presents to the emergency department for chief concerns of shortness of breath and dizziness. 09/20: in ED, VSS, mild hyponatremia sodium 132, significantly elevated creatinine 4.18 w/ GFR 16, elevated LFT  AST 98 and ALT 76, Bili 1.9, ALP 156, BNP WNL, Troponin and CK WNL, WBC elevated 17.5, Hgb 9.1. CXR nonacute. Renal US normal no dilatation, Abd [place] (+)fatty liver, GB w/ diffuse wall thickening and focal nodular area measuring up to 19 by 7 x 5 mm concern for underlying lesion and advise compare w/ MRI. Given Protonix 80 mg IV one-time dose, sodium chloride 1 L bolus. Admitted to hospitalist service.  09/21: SOB improved, renal function worse Cr to 5.88, consulted to nephrology. Pt c/o scrotal pain, [place] (+)small bl hydrocele L varicocele L epididymal cyst - started tx for epididymitis given leukocytosis  09/22: early  AM, scrotum apparently opened to drain - pt describes clear thin liquid, bandage appears serous fluid, no purulent, no erythema to skin - monitor. Cr worse, up to 8, nephrology following, continuing IV fluids.  09/23: Cr still worsening and may need to consider dialysis tomorrow, urology consult repeating US - no abscess 09/24: permcath placement and dialysis after that  Consultants:  Nephrology  Vascular surgery Urology   Procedures: none      ASSESSMENT & PLAN:   AKI (acute kidney injury) on CKD3a - worsening --> acute renal faillure  suspect multifactorial including prerenal in setting of poor p.o./fluid intake, NSAID use,  glomerulonephritis, rhabdomyolysis, infection related AKI from epididymitis.  No concerns on renal US  UDS neg CK WNL Monitor BMP --> worsening  Strict I&O Nephrology following  --> recs for HD, permcath today and HD this afternoon  Pending ANA, ANCA, C3, C4, kappa/lambda, PTH, protein electrophoresis NS continuous at 125 mL/h   Shortness of breath without respiratory failure - resolved  Seems likely to be respiratory compensation for metabolic acidosis d/t renal failure  Differentials would include COPD history of tobacco use  CXR personally reviewed, no concerns  Neg troponin, ACS unlikely   Gallbladder mass - nodular 19 mm x 7 mm x 5 mm on RUQ [place] Associated w/ elevation in Bili, AST, ALT, ALP Risk of gallbladder carcinoma given the size read  before surgical intervention is considered, need further imaging  MRCP with and without contrast has been deferred on admission due to acute kidney injury, but plan for this if/when pt can tolerate contrast  Elevated liver enzymes Suspect  secondary to gallbladder disease as above, complicated by alcohol abuse  Monitor CMP, LFT Hepatitis panel neg    History Seizures Calculated creatinine clearance is 13 mL/min on admission Patient on Keppra 1000 mg p.o. twice daily at home. Placed on Keppra reduced  dosing to 500 mg p.o. twice daily in setting of acute kidney injury   Scrotal pain w/  Small bilateral hydrocele Left-sided varicocele 8 mm left epididymal cyst. Leukocytosis Ceftriaxone x1 + doxycycline 100 mg bid x10 days (FQ would cover enteric pathogens but pt not sexually active for some time, FQ renal dosing problematic, will give doxy) started 09/21 Check for GC/CT in urine --> neg  Frequent dressing changes Urology following  Hyponatremia - stable, mild Likely d/t renal failure  Follow BMP   Metabolic acidosis Mild, secondary to acute kidney injury Sodium bicarbonate 325 mg p.o. twice daily, 2 doses ordered   History CVA Statin held for now given elevation LFT  Plavix   HLD  04/25/2023, cardiologist discontinued Pravachol and started patient on Crestor 40 mg w/ improvement in cholesterol and AST/ALT.  Hold statin for now w/ elevated LFT   Essential HTN On home amlodipine, carvedilol   GERD  PPI  Insomnia Melatonin 5 mg nightly as needed for sleep   Tobacco abuse As needed nicotine patch ordered      Obesity based on BMI: Body mass index is 30.37 kg/m.   DVT prophylaxis: heparin  IV fluids: NS continuous IV fluids per nephrology  Nutrition: renal/carb Central lines / invasive devices: none  Code Status: FULL CODE ACP documentation reviewed: 06/29/23 and none on file in [facility]  TOC needs: none at this time Barriers to dispo / significant pending items: renal function improvement        Assessment and Plan: * AKI (acute kidney injury) (HCC) Calculated creatinine clearance is 13 On CKD 3A, suspect multifactorial including prerenal in setting of poor p.o./fluid intake Status post sodium chloride 1 L bolus per EDP Sodium chloride infusion at 125 mL/h, 2 days ordered Ultrasound renal  Gallbladder mass With elevated alk phos Focal nodular measuring up to 19 mm x 7 mm x 5 mm on RUQ [place], which was not seen on RUQ [place] in 02/21/23, increased risk of  carcinoma given the size read however before surgical intervention is considered, I would like further imaging to further delineate the lesion MRCP with and without contrast has been deferred on admission due to acute kidney injury AM team to order MRCP with and without contrast pending renal function  Elevated liver enzymes Suspect secondary to alcohol abuse however as patient had right upper quadrant abdominal discomfort with EDP, RUQ abd [place] ordered by EDP Admit to telemetry cardiac, inpatient  Seizures (HCC) Calculated creatinine clearance is 13 mL/min on admission Patient on Keppra 1000 mg p.o. twice daily Keppra reduced dosing to 500 mg p.o. twice daily in setting of acute kidney injury AM team to resume home Keppra dosing when the benefits outweigh the risk  Leukocytosis Workup in progress, no definitive source of infection at this time and lactic acid not elevated. No abx at this time RUQ abdominal ultrasound pending read and UA ordered  Hyponatremia Mild, asymptomatic Sodium bicarbonate 325 mg p.o. twice daily, 2 doses ordered Check BMP in a.m.  Metabolic acidosis Mild, secondary to acute kidney injury Sodium bicarbonate 325 mg p.o. twice daily, 2 doses ordered  Shortness of breath Etiology workup in progress, differentials is broad at this point given patient history of tobacco use and  alcohol use and dependence Will check high sensitive troponin Right upper quadrant abdominal ultrasound ordered pending completion and read at the time of this dictation Continue sodium chloride infusion at 125 mL/h, 2 days ordered Check BMP in the a.m. Admit to telemetry cardiac, inpatient  Insomnia Melatonin 5 mg nightly as needed for sleep  Tobacco abuse As needed nicotine patch ordered      {Tip this will not be part of the note when signed Body mass index is 28.44 kg/m. , ,  (Optional):26781}  {(NOTE) Pain control PDMP Statment (Optional):26782} Consultants:  *** Procedures performed: ***  Disposition: {Plan; Disposition:26390} Diet recommendation:  {Diet_Plan:26776} DISCHARGE MEDICATION: Allergies as of 07/10/2023   No Known Allergies      Medication List     STOP taking these medications    folic acid 1 MG tablet Commonly known as: FOLVITE   ibuprofen 200 MG tablet Commonly known as: ADVIL   losartan 100 MG tablet Commonly known as: Cozaar   magnesium oxide 400 MG tablet Commonly known as: MAG-OX   thiamine 100 MG tablet Commonly known as: VITAMIN B1   vitamin B-12 500 MCG tablet Commonly known as: CYANOCOBALAMIN       TAKE these medications    amLODipine 10 MG tablet Commonly known as: NORVASC Take 1 tablet (10 mg total) by mouth every evening.   amoxicillin-clavulanate 500-125 MG tablet Commonly known as: AUGMENTIN Take 1 tablet by mouth daily for 7 days.   aspirin 81 MG tablet Take 1 tablet (81 mg total) by mouth daily.   carvedilol 25 MG tablet Commonly known as: COREG Take 1 tablet (25 mg total) by mouth 2 (two) times daily with a meal.   clopidogrel 75 MG tablet Commonly known as: PLAVIX Take 1 tablet (75 mg total) by mouth daily.   levETIRAcetam 500 MG tablet Commonly known as: KEPPRA Take 1 tablet (500 mg total) by mouth 2 (two) times daily. What changed:  medication strength how much to take   levETIRAcetam 500 MG tablet Commonly known as: KEPPRA Take 1 tablet (500 mg total) by mouth 3 (three) times a week. After hemodialysis What changed: You were already taking a medication with the same name, and this prescription was added. Make sure you understand how and when to take each.   levofloxacin 500 MG tablet Commonly known as: LEVAQUIN Take 1 tablet (500 mg total) by mouth every other day for 8 days. Start taking on: July 11, 2023   pantoprazole 40 MG tablet Commonly known as: PROTONIX Take 1 tablet (40 mg total) by mouth daily.   Rightest GL300 Lancets Misc Use as directed to  check blood glucose 2-3 times per week and as needed   Rightest GM550 Blood Glucose w/Device Kit Use as directed to check blood glucose 2-3 times per week and as needed   Rightest GS550 Blood Glucose test strip Generic drug: glucose blood Use as directed to check blood glucose 2-3 times per week and as needed   rosuvastatin 10 MG tablet Commonly known as: CRESTOR Take 1 tablet (10 mg total) by mouth daily. What changed:  medication strength how much to take   sildenafil 100 MG tablet Commonly known as: VIAGRA Take 1 tablet 1 hour prior to intercourse.   Vitamin D (Ergocalciferol) 1.25 MG (50000 UNIT) Caps capsule Commonly known as: DRISDOL Take 1 capsule (50,000 Units total) by mouth every 7 (seven) days. Start taking on: July 11, 2023        Discharge Exam: Ceasar Mons Weights  07/08/23 1204 07/10/23 0822 07/10/23 1217  Weight: 91 kg 90.7 kg 89.9 kg   ***  Condition at discharge: {DC Condition:26389}  The results of significant diagnostics from this hospitalization (including imaging, microbiology, ancillary and laboratory) are listed below for reference.   Imaging Studies: [place] RT LOWER EXTREM LTD SOFT TISSUE NON VASCULAR  Result Date: 07/06/2023 CLINICAL DATA:  Concern for RIGHT groin abscess. Fluid drainage. Evaluate for abscess in, no masses EXAM: ULTRASOUND RIGHT LOWER EXTREMITY LIMITED TECHNIQUE: Ultrasound examination of the lower extremity soft tissues was performed in the area of clinical concern. COMPARISON:  None Available. FINDINGS: Evaluation of the RIGHT groin inguinal area demonstrates subcutaneous edema. No organized fluid collection. Potential drainage site at the skin level. IMPRESSION: 1. No organized fluid collection in the RIGHT groin inguinal region. 2. Extensive subcutaneous edema. Electronically Signed   By: Genevive Bi M.D.   On: 07/06/2023 14:55   MR 3D Recon At Scanner  Result Date: 07/04/2023 : CLINICAL DATA: Gallbladder wall thickening  identified by prior ultrasound EXAM: MRI ABDOMEN WITHOUT CONTRAST (INCLUDING MRCP) TECHNIQUE: Multiplanar multisequence MR imaging of the abdomen was performed. Heavily T2-weighted images of the biliary and pancreatic ducts were obtained, and three-dimensional MRCP images were rendered by post processing. COMPARISON: Right upper quadrant ultrasound, 06/28/2023 FINDINGS: Lower chest: Small bilateral pleural effusions. Hepatobiliary: No solid liver abnormality is seen. Hepatic steatosis. Diffuse gallbladder wall thickening. No gallstones or biliary dilatation. Pancreas: Unremarkable. No pancreatic ductal dilatation or surrounding inflammatory changes. Spleen: Normal in size without significant abnormality. Adrenals/Urinary Tract: Adrenal glands are unremarkable. Kidneys are normal, without renal calculi, solid lesion, or hydronephrosis. Stomach/Bowel: Stomach is within normal limits. No evidence of bowel wall thickening, distention, or inflammatory changes. Vascular/Lymphatic: No significant vascular findings are present. No enlarged abdominal lymph nodes. Other: No abdominal wall hernia. Mild anasarca. Trace perihepatic ascites. Musculoskeletal: No acute or significant osseous findings. IMPRESSION: 1. Diffuse gallbladder wall thickening. No gallstones or biliary dilatation. No focal lesion per findings of prior ultrasound. 2. Hepatic steatosis. 3. Small bilateral pleural effusions. 4. Trace perihepatic ascites and anasarca. Electronically Signed   By: Jearld Lesch M.D.   On: 07/04/2023 12:42   MR ABDOMEN MRCP WO CONTRAST  Result Date: 07/03/2023 CLINICAL DATA:  Gallbladder wall thickening identified by prior ultrasound EXAM: MRI ABDOMEN WITHOUT CONTRAST  (INCLUDING MRCP) TECHNIQUE: Multiplanar multisequence MR imaging of the abdomen was performed. Heavily T2-weighted images of the biliary and pancreatic ducts were obtained, and three-dimensional MRCP images were rendered by post processing. COMPARISON:  Right  upper quadrant ultrasound, 06/28/2023 FINDINGS: Lower chest: Small bilateral pleural effusions. Hepatobiliary: No solid liver abnormality is seen. Hepatic steatosis. Diffuse gallbladder wall thickening. No gallstones or biliary dilatation. Pancreas: Unremarkable. No pancreatic ductal dilatation or surrounding inflammatory changes. Spleen: Normal in size without significant abnormality. Adrenals/Urinary Tract: Adrenal glands are unremarkable. Kidneys are normal, without renal calculi, solid lesion, or hydronephrosis. Stomach/Bowel: Stomach is within normal limits. No evidence of bowel wall thickening, distention, or inflammatory changes. Vascular/Lymphatic: No significant vascular findings are present. No enlarged abdominal lymph nodes. Other: No abdominal wall hernia. Mild anasarca. Trace perihepatic ascites. Musculoskeletal: No acute or significant osseous findings. IMPRESSION: 1. Diffuse gallbladder wall thickening. No gallstones or biliary dilatation. No focal lesion per findings of prior ultrasound. 2. Hepatic steatosis. 3. Small bilateral pleural effusions. 4. Trace perihepatic ascites and anasarca. Electronically Signed   By: Jearld Lesch M.D.   On: 07/03/2023 18:47   PERIPHERAL VASCULAR CATHETERIZATION  Result Date: 07/02/2023 See surgical note for  result.  [place] RT LOWER EXTREM LTD SOFT TISSUE NON VASCULAR  Result Date: 07/01/2023 CLINICAL DATA:  Right inguinal abscess EXAM: ULTRASOUND right LOWER EXTREMITY LIMITED TECHNIQUE: Ultrasound examination of the lower extremity soft tissues was performed in the area of clinical concern. COMPARISON:  Scrotal ultrasound 06/29/2023 FINDINGS: Targeted sonography in the region of concern described as lateral inferior scrotum is performed. Edematous soft tissues with probable areas of air within. Heterogenous tissue deep to the skin wound contains vascularity and presumably represents inflammatory tissue. No focal fluid collection is seen. IMPRESSION: Heterogenous  tissue deep to the skin wound contains vascularity and presumably represents inflammatory tissue. No focal fluid collection is seen. Electronically Signed   By: Jasmine Pang M.D.   On: 07/01/2023 18:33   US SCROTUM W/DOPPLER  Result Date: 06/29/2023 CLINICAL DATA:  Scrotal pain and swelling for 4 days. EXAM: SCROTAL ULTRASOUND DOPPLER ULTRASOUND OF THE TESTICLES TECHNIQUE: Complete ultrasound examination of the testicles, epididymis, and other scrotal structures was performed. Color and spectral Doppler ultrasound were also utilized to evaluate blood flow to the testicles. COMPARISON:  None Available. FINDINGS: Right testicle Measurements: 4.7 x 2.2 x 3.1 cm. No mass or microlithiasis visualized. Left testicle Measurements: 4.6 x 2.4 x 2.8 cm. No mass or microlithiasis visualized. Right epididymis:  Normal in size and appearance. Left epididymis: Normal in size and vascularity. There is a 4 x 8 x 7 mm cyst in the left epididymis. Hydrocele:  Small bilateral hydroceles are present. Varicocele:  Left-sided varicocele is present. Pulsed Doppler interrogation of both testes demonstrates normal low resistance arterial and venous waveforms bilaterally. IMPRESSION: 1. No evidence of testicular torsion. 2. Small bilateral hydroceles. 3. Left-sided varicocele. 4. 8 mm left epididymal cyst. Electronically Signed   By: Darliss Cheney M.D.   On: 06/29/2023 16:36   US RENAL  Result Date: 06/28/2023 CLINICAL DATA:  Acute kidney injury EXAM: RENAL / URINARY TRACT ULTRASOUND COMPLETE COMPARISON:  None Available. FINDINGS: Right Kidney: Renal measurements: 11.4 x 5.0 x 5.6 cm = volume: 167.7 mL. Echogenicity within normal limits. No mass or hydronephrosis visualized. Left Kidney: Renal measurements: 12.2 x 6.9 x 6.5 cm = volume: 287.1 mL. Echogenicity within normal limits. No mass or hydronephrosis visualized. Bladder: Appears normal for degree of bladder distention. Other: None. IMPRESSION: No collecting system dilatation.  Electronically Signed   By: Karen Kays M.D.   On: 06/28/2023 16:42   US ABDOMEN LIMITED RUQ (LIVER/GB)  Result Date: 06/28/2023 CLINICAL DATA:  Right upper quadrant pain for 2 days EXAM: ULTRASOUND ABDOMEN LIMITED RIGHT UPPER QUADRANT COMPARISON:  Ultrasound 02/21/2023 FINDINGS: Gallbladder: Mildly distended gallbladder. No shadowing stones. There is mild gallbladder wall thickening of 5 mm of uncertain etiology. There is also a hypoechoic area along the margin of the gallbladder measuring 19 x 7 x 5 mm. Underlying lesion is possible. No blood flow to this area on Doppler. Common bile duct: Diameter: 4 mm Liver: Diffusely echogenic hepatic parenchyma consistent with fatty liver infiltration. Portal vein is patent on color Doppler imaging with normal direction of blood flow towards the liver. Other: None. IMPRESSION: No gallstones. However gallbladder wall has a diffuse thickening with a focal nodular area measuring up to 19 by 7 x 5 mm. Underlying lesion is possible. Please correlate with any prior or follow up MRI with and without contrast when appropriate to further delineate. No biliary ductal dilatation.  Fatty liver infiltration. Electronically Signed   By: Karen Kays M.D.   On: 06/28/2023 15:06   DG  Chest 2 View  Result Date: 06/28/2023 CLINICAL DATA:  Shortness of breath EXAM: CHEST - 2 VIEW COMPARISON:  None Available. FINDINGS: The heart size and mediastinal contours are within normal limits. No consolidation, pneumothorax or effusion. No edema. The visualized skeletal structures are unremarkable. IMPRESSION: No acute cardiopulmonary disease. Electronically Signed   By: Karen Kays M.D.   On: 06/28/2023 12:49    Microbiology: Results for orders placed or performed during the hospital encounter of 06/28/23  Blood culture (single)     Status: None   Collection Time: 06/28/23 12:22 PM   Specimen: BLOOD  Result Value Ref Range Status   Specimen Description BLOOD LEFT ANTECUBITAL  Final    Special Requests   Final    BOTTLES DRAWN AEROBIC AND ANAEROBIC Blood Culture adequate volume   Culture   Final    NO GROWTH 5 DAYS Performed at [facility], [address] [place], [place] [address]    Report Status 07/03/2023 FINAL  Final  SARS Coronavirus 2 by RT PCR (hospital order, performed in [facility] hospital lab) *cepheid single result test* Anterior Nasal Swab     Status: None   Collection Time: 06/29/23 12:15 AM   Specimen: Anterior Nasal Swab  Result Value Ref Range Status   SARS Coronavirus 2 by RT PCR NEGATIVE NEGATIVE Final    Comment: (NOTE) SARS-CoV-2 target nucleic acids are NOT DETECTED.  The SARS-CoV-2 RNA is generally detectable in upper and lower respiratory specimens during the acute phase of infection. The lowest concentration of SARS-CoV-2 viral copies this assay can detect is 250 copies / mL. A negative result does not preclude SARS-CoV-2 infection and should not be used as the sole basis for treatment or other patient management decisions.  A negative result may occur with improper specimen collection / handling, submission of specimen other than nasopharyngeal swab, presence of viral mutation(s) within the areas targeted by this assay, and inadequate number of viral copies (<250 copies / mL). A negative result must be combined with clinical observations, patient history, and epidemiological information.  Fact Sheet for Patients:   RoadLapTop.co.za  Fact Sheet for Healthcare Providers: http://kim-miller.com/  This test is not yet approved or  cleared by the [place] FDA and has been authorized for detection and/or diagnosis of SARS-CoV-2 by FDA under an Emergency Use Authorization (EUA).  This EUA will remain in effect (meaning this test can be used) for the duration of the COVID-19 declaration under Section 564(b)(1) of the Act, 21 U.S.C. section 360bbb-3(b)(1), unless the authorization  is terminated or revoked sooner.  Performed at [facility], [address] [place], [place] [address]   Chlamydia/NGC rt PCR [facility] only)     Status: None   Collection Time: 06/29/23 10:42 AM   Specimen: Urine; GU  Result Value Ref Range Status   Specimen source GC/Chlam URINE, RANDOM  Final   Chlamydia Tr NOT DETECTED NOT DETECTED Final   N gonorrhoeae NOT DETECTED NOT DETECTED Final    Comment: (NOTE) This CT/NG assay has not been evaluated in patients with a history of  hysterectomy. Performed at [facility], [address] [place], [place] [address]     Labs: CBC: Recent Labs  Lab 07/06/23 0335 07/07/23 0309 07/08/23 0536 07/09/23 0435 07/10/23 0501  WBC 15.3* 18.0* 17.4* 19.4* 19.0*  HGB 7.1* 7.0* 8.2* 8.4* 8.3*  HCT 20.7* 20.2* 23.8* 25.0* 24.1*  MCV 93.2 91.4 90.2 92.9 91.3  PLT 241 322 348 388 419*  Basic Metabolic Panel: Recent Labs  Lab 07/04/23 0309 07/05/23 0622 07/06/23 0335 07/07/23 0309 07/08/23 0755 07/10/23 0851  NA 137 140 137 138 139 133*  K 3.7 3.5 3.4* 3.6 3.5 3.7  CL 109 105 98 103 105 102  CO2 19* 23 26 25 22 23   GLUCOSE 98 93 78 103* 105* 98  BUN 65* 46* 31* 38* 41* 48*  CREATININE 7.94* 6.31* 4.84* 5.38* 4.80* 4.45*  CALCIUM 7.9* 8.0* 8.1* 8.6* 8.9 9.1  MG 1.7 1.6* 1.6*  --   --   --   PHOS 4.7* 4.4 4.1  --  4.1 4.8*   Liver Function Tests: Recent Labs  Lab 07/07/23 0309 07/08/23 0755 07/10/23 0851  AST 29  --   --   ALT 23  --   --   ALKPHOS 122  --   --   BILITOT 0.9  --   --   PROT 7.4  --   --   ALBUMIN 2.9* 3.1* 3.3*   CBG: No results for input(s): "GLUCAP" in the last 168 hours.  Discharge time spent: {LESS THAN/GREATER ZOXW:96045} 30 minutes.  Signed: Marcelino Duster, MD [facility] 07/10/2023

## 2023-07-11 ENCOUNTER — Telehealth: Payer: Self-pay

## 2023-07-11 NOTE — Telephone Encounter (Signed)
Called pt to advise pt to go to [facility] ED to get admitted to start dialysis and have [facility] refer him to [facility] dialysis here in [place]. Pt stated he is not able to make it to [facility]. So pt plans to go to [facility] tmw and every other day until next Friday. Pt has appt here with Lanora Manis on Tuesday. I expressed to the pt he needs the dialysis so make sure he goes and ger it. Pt verbalized understanding.

## 2023-07-12 ENCOUNTER — Encounter: Payer: Self-pay | Admitting: Emergency Medicine

## 2023-07-12 ENCOUNTER — Other Ambulatory Visit: Payer: Self-pay

## 2023-07-12 ENCOUNTER — Emergency Department
Admission: EM | Admit: 2023-07-12 | Discharge: 2023-07-12 | Disposition: A | Discharge status: Home / Self Care | Payer: Medicaid Other | Attending: Emergency Medicine | Admitting: Emergency Medicine

## 2023-07-12 DIAGNOSIS — Z8673 Personal history of transient ischemic attack (TIA), and cerebral infarction without residual deficits: Secondary | ICD-10-CM | POA: Diagnosis not present

## 2023-07-12 DIAGNOSIS — I120 Hypertensive chronic kidney disease with stage 5 chronic kidney disease or end stage renal disease: Secondary | ICD-10-CM | POA: Diagnosis present

## 2023-07-12 DIAGNOSIS — D72829 Elevated white blood cell count, unspecified: Secondary | ICD-10-CM | POA: Insufficient documentation

## 2023-07-12 DIAGNOSIS — Z992 Dependence on renal dialysis: Secondary | ICD-10-CM | POA: Insufficient documentation

## 2023-07-12 DIAGNOSIS — N186 End stage renal disease: Secondary | ICD-10-CM | POA: Insufficient documentation

## 2023-07-12 LAB — COMPREHENSIVE METABOLIC PANEL
ALT: 32 U/L (ref 0–44)
AST: 34 U/L (ref 15–41)
Albumin: 3.4 g/dL — ABNORMAL LOW (ref 3.5–5.0)
Alkaline Phosphatase: 126 U/L (ref 38–126)
Anion gap: 11 (ref 5–15)
BUN: 56 mg/dL — ABNORMAL HIGH (ref 6–20)
CO2: 24 mmol/L (ref 22–32)
Calcium: 9.1 mg/dL (ref 8.9–10.3)
Chloride: 99 mmol/L (ref 98–111)
Creatinine, Ser: 5.35 mg/dL — ABNORMAL HIGH (ref 0.61–1.24)
GFR, Estimated: 12 mL/min — ABNORMAL LOW (ref >60)
Glucose, Bld: 114 mg/dL — ABNORMAL HIGH (ref 70–99)
Potassium: 3.9 mmol/L (ref 3.5–5.1)
Sodium: 134 mmol/L — ABNORMAL LOW (ref 135–145)
Total Bilirubin: 0.5 mg/dL (ref 0.3–1.2)
Total Protein: 8.9 g/dL — ABNORMAL HIGH (ref 6.5–8.1)

## 2023-07-12 LAB — CBC WITH DIFFERENTIAL/PLATELET
Abs Immature Granulocytes: 0.09 10*3/uL — ABNORMAL HIGH (ref 0.00–0.07)
Basophils Absolute: 0.1 10*3/uL (ref 0.0–0.1)
Basophils Relative: 0 %
Eosinophils Absolute: 0 10*3/uL (ref 0.0–0.5)
Eosinophils Relative: 0 %
HCT: 25.6 % — ABNORMAL LOW (ref 39.0–52.0)
Hemoglobin: 8.4 g/dL — ABNORMAL LOW (ref 13.0–17.0)
Immature Granulocytes: 1 %
Lymphocytes Relative: 18 %
Lymphs Abs: 3.1 10*3/uL (ref 0.7–4.0)
MCH: 30.8 pg (ref 26.0–34.0)
MCHC: 32.8 g/dL (ref 30.0–36.0)
MCV: 93.8 fL (ref 80.0–100.0)
Monocytes Absolute: 1.5 10*3/uL — ABNORMAL HIGH (ref 0.1–1.0)
Monocytes Relative: 9 %
Neutro Abs: 12.4 10*3/uL — ABNORMAL HIGH (ref 1.7–7.7)
Neutrophils Relative %: 72 %
Platelets: 440 10*3/uL — ABNORMAL HIGH (ref 150–400)
RBC: 2.73 MIL/uL — ABNORMAL LOW (ref 4.22–5.81)
RDW: 13.7 % (ref 11.5–15.5)
WBC: 17.1 10*3/uL — ABNORMAL HIGH (ref 4.0–10.5)
nRBC: 0 % (ref 0.0–0.2)

## 2023-07-12 MED ORDER — EPOETIN ALFA 10000 UNIT/ML IJ SOLN
10000.0000 [IU] | Freq: Once | INTRAMUSCULAR | Status: AC
  Administered 2023-07-12: 10000 [IU] via INTRAVENOUS

## 2023-07-12 MED ORDER — CHLORHEXIDINE GLUCONATE CLOTH 2 % EX PADS
6.0000 | MEDICATED_PAD | Freq: Every day | CUTANEOUS | Status: DC
  Filled 2023-07-12: qty 6

## 2023-07-12 MED ORDER — ALTEPLASE 2 MG IJ SOLR: 2.0000 mg | Freq: Once | INTRAMUSCULAR | Status: DC | PRN

## 2023-07-12 MED ORDER — HEPARIN SODIUM (PORCINE) 1000 UNIT/ML DIALYSIS: 1000.0000 [IU] | INTRAMUSCULAR | Status: DC | PRN

## 2023-07-12 MED ORDER — EPOETIN ALFA 10000 UNIT/ML IJ SOLN
INTRAMUSCULAR | Status: AC
  Filled 2023-07-12: qty 1

## 2023-07-12 NOTE — ED Triage Notes (Signed)
Pt here for dialysis treatment. Was here Wednesday and was told to come back today.

## 2023-07-12 NOTE — Progress Notes (Signed)
[facility]  ROUNDING NOTE   Subjective:   Gary [name] is a 52 y.o. male who recently was admitted for acute kidney injury and initiated hemodialysis. Do to lack of insurance, he is unable to be placed in outpatient clinic at this time. He became frustrated with admission and signed out against medical advice. He returns today for dialysis.   Patient seen and evaluated during dialysis   HEMODIALYSIS FLOWSHEET:  Blood Flow Rate (mL/min): 399 mL/min Arterial Pressure (mmHg): -174.33 mmHg Venous Pressure (mmHg): 184.23 mmHg TMP (mmHg): 17.17 mmHg Ultrafiltration Rate (mL/min): 543 mL/min Dialysate Flow Rate (mL/min): 299 ml/min  Tolerating treatment.   Objective:  Vital signs in last 24 hours:  Temp:  [98 F (36.7 C)-98.3 F (36.8 C)] 98 F (36.7 C) (10/04 1023) Pulse Rate:  [71-96] 71 (10/04 1036) Resp:  [16-18] 16 (10/04 1036) BP: (104-123)/(77-84) 104/77 (10/04 1036) SpO2:  [100 %] 100 % (10/04 1036) Weight:  [89.9 kg-91.8 kg] 91.8 kg (10/04 1024)  Weight change:  Filed Weights   07/12/23 0728 07/12/23 1024  Weight: 89.9 kg 91.8 kg    Intake/Output: No intake/output data recorded.   Intake/Output this shift:  No intake/output data recorded.  Physical Exam: General: NAD  Head: Normocephalic, atraumatic. Moist oral mucosal membranes  Eyes: Anicteric  Lungs:  Clear to auscultation normal effort  Heart: Regular rate and rhythm  Abdomen:  Soft, nontender  Extremities: No peripheral edema.  Neurologic: Alert, moving all four extremities  Skin: No lesions.    Access: Rt chest permcath placed on 07/02/23 by Dr Wyn Quaker    Basic Metabolic Panel: Recent Labs  Lab 07/06/23 0335 07/07/23 0309 07/08/23 0755 07/10/23 0851 07/12/23 0824  NA 137 138 139 133* 134*  K 3.4* 3.6 3.5 3.7 3.9  CL 98 103 105 102 99  CO2 26 25 22 23 24   GLUCOSE 78 103* 105* 98 114*  BUN 31* 38* 41* 48* 56*  CREATININE 4.84* 5.38* 4.80* 4.45* 5.35*  CALCIUM 8.1* 8.6* 8.9 9.1  9.1  MG 1.6*  --   --   --   --   PHOS 4.1  --  4.1 4.8*  --     Liver Function Tests: Recent Labs  Lab 07/07/23 0309 07/08/23 0755 07/10/23 0851 07/12/23 0824  AST 29  --   --  34  ALT 23  --   --  32  ALKPHOS 122  --   --  126  BILITOT 0.9  --   --  0.5  PROT 7.4  --   --  8.9*  ALBUMIN 2.9* 3.1* 3.3* 3.4*   No results for input(s): "LIPASE", "AMYLASE" in the last 168 hours. No results for input(s): "AMMONIA" in the last 168 hours.  CBC: Recent Labs  Lab 07/07/23 0309 07/08/23 0536 07/09/23 0435 07/10/23 0501 07/12/23 0824  WBC 18.0* 17.4* 19.4* 19.0* 17.1*  NEUTROABS  --   --   --   --  12.4*  HGB 7.0* 8.2* 8.4* 8.3* 8.4*  HCT 20.2* 23.8* 25.0* 24.1* 25.6*  MCV 91.4 90.2 92.9 91.3 93.8  PLT 322 348 388 419* 440*    Cardiac Enzymes: No results for input(s): "CKTOTAL", "CKMB", "CKMBINDEX", "TROPONINI" in the last 168 hours.   BNP: Invalid input(s): "POCBNP"  CBG: No results for input(s): "GLUCAP" in the last 168 hours.  Microbiology: Results for orders placed or performed during the hospital encounter of 06/28/23  Blood culture (single)     Status: None   Collection Time:  06/28/23 12:22 PM   Specimen: BLOOD  Result Value Ref Range Status   Specimen Description BLOOD LEFT ANTECUBITAL  Final   Special Requests   Final    BOTTLES DRAWN AEROBIC AND ANAEROBIC Blood Culture adequate volume   Culture   Final    NO GROWTH 5 DAYS Performed at [facility], [address], [place], [place] [address]    Report Status 07/03/2023 FINAL  Final  SARS Coronavirus 2 by RT PCR (hospital order, performed in [facility] hospital lab) *cepheid single result test* Anterior Nasal Swab     Status: None   Collection Time: 06/29/23 12:15 AM   Specimen: Anterior Nasal Swab  Result Value Ref Range Status   SARS Coronavirus 2 by RT PCR NEGATIVE NEGATIVE Final    Comment: (NOTE) SARS-CoV-2 target nucleic acids are NOT DETECTED.  The SARS-CoV-2 RNA is generally  detectable in upper and lower respiratory specimens during the acute phase of infection. The lowest concentration of SARS-CoV-2 viral copies this assay can detect is 250 copies / mL. A negative result does not preclude SARS-CoV-2 infection and should not be used as the sole basis for treatment or other patient management decisions.  A negative result may occur with improper specimen collection / handling, submission of specimen other than nasopharyngeal swab, presence of viral mutation(s) within the areas targeted by this assay, and inadequate number of viral copies (<250 copies / mL). A negative result must be combined with clinical observations, patient history, and epidemiological information.  Fact Sheet for Patients:   RoadLapTop.co.za  Fact Sheet for Healthcare Providers: http://kim-miller.com/  This test is not yet approved or  cleared by the [place] FDA and has been authorized for detection and/or diagnosis of SARS-CoV-2 by FDA under an Emergency Use Authorization (EUA).  This EUA will remain in effect (meaning this test can be used) for the duration of the COVID-19 declaration under Section 564(b)(1) of the Act, 21 U.S.C. section 360bbb-3(b)(1), unless the authorization is terminated or revoked sooner.  Performed at [facility], [address] [place], [place] [address]   Chlamydia/NGC rt PCR [facility] only)     Status: None   Collection Time: 06/29/23 10:42 AM   Specimen: Urine; GU  Result Value Ref Range Status   Specimen source GC/Chlam URINE, RANDOM  Final   Chlamydia Tr NOT DETECTED NOT DETECTED Final   N gonorrhoeae NOT DETECTED NOT DETECTED Final    Comment: (NOTE) This CT/NG assay has not been evaluated in patients with a history of  hysterectomy. Performed at [facility], [address] [place], [place] [address]     Coagulation Studies: No results for input(s): "LABPROT", "INR" in the  last 72 hours.   Urinalysis: No results for input(s): "COLORURINE", "LABSPEC", "PHURINE", "GLUCOSEU", "HGBUR", "BILIRUBINUR", "KETONESUR", "PROTEINUR", "UROBILINOGEN", "NITRITE", "LEUKOCYTESUR" in the last 72 hours.  Invalid input(s): "APPERANCEUR"     Imaging: No results found.   Medications:      Chlorhexidine Gluconate Cloth  6 each Topical Q0600   alteplase, heparin  Assessment/ Plan:  [name] is a 52 y.o.  male with medical problems of hypertension, alcohol abuse, hyperlipidemia, history of TIA, GERD, erectile dysfunction, tobacco abuse, history of seizures  was admitted on 06/28/2023 for Dialysis   Acute kidney injury on chronic kidney disease stage IIIa/proteinuria. AKI secondary to severe ATN with chronic NSAID use HTN with chronic kidney disease Anemia with chronic kidney disease  Lab Results  Component Value Date   CREATININE  5.35 (H) 07/12/2023   CREATININE 4.45 (H) 07/10/2023   CREATININE 4.80 (H) 07/08/2023   No intake or output data in the 24 hours ending 07/12/23 1101    Plan: Patient receiving dialysis today, UF goal 1L as tolerated. Next treatment on Monday.  Instructed patient to hold antihypertensives prior to dialysis.  Hgb 8.4, will order EPO with dialysis   LOS: 0 Nehemias Sauceda 10/4/202411:01 AM

## 2023-07-12 NOTE — Progress Notes (Signed)
  Received patient in bed to unit.   Informed consent signed and in chart.    TX duration: 3.5 hrs     Transported back to ED Hand-off given to patient's nurse. No c/o and no distress noted    Access used: R HD Catheter Access issues: none   Total UF removed: 0.4L Medication(s) given: 10,000u epogen  Post HD VS: 119/81 Post HD weight: 90.9kg     Lynann Beaver  [facility]

## 2023-07-12 NOTE — Discharge Instructions (Signed)
Please return on Monday for your next dialysis.  Please return for any new, worsening, or changing symptoms or other concerns.  It was a pleasure caring for you today.

## 2023-07-12 NOTE — ED Provider Notes (Signed)
[facility] Provider Note    None    (approximate)   History   Dialysis   HPI  Gary [name] is a 52 y.o. male with a medical history of end-stage renal disease with a recent admission for dialysis initiation who presents today with request for dialysis.  Patient reports that he was instructed to come here today to get dialysis.  He reports that he has no other complaints today.  He reports that he is still making urine.  He does not have any nausea or vomiting.  He has not had any fevers or chills.  He reports that his scrotal abscess has resolved and he does not have any complaints about this today.  He reports that he is unwilling to stay in the hospital even if that is recommended for him.  Patient Active Problem List   Diagnosis Date Noted   Encounter regarding vascular access for dialysis for end-stage renal disease (HCC) 07/02/2023   Shortness of breath 06/28/2023   AKI (acute kidney injury) (HCC) 06/28/2023   Metabolic acidosis 06/28/2023   Hyponatremia 06/28/2023   Leukocytosis 06/28/2023   Gallbladder mass 06/28/2023   TIA (transient ischemic attack) 07/31/2022   Hyperglycemia 07/31/2022   Erectile dysfunction 06/07/2022   Cough 11/23/2021   Bilateral arm pain 10/19/2021   Headache 09/10/2021   Insomnia 09/07/2021   Elevated serum creatinine 04/20/2021   Elevated liver enzymes 02/16/2021   Peripheral neuropathy 02/16/2021   Elevated troponin 12/29/2020   Stroke (HCC) 12/29/2020   Acute renal failure superimposed on stage 2 chronic kidney disease (HCC) 12/29/2020   Alcohol abuse    Tobacco abuse    Hypertension 11/07/2017   Hyperlipidemia 11/07/2017   Seizures (HCC) 11/07/2017   History of stroke 11/07/2017   Submandibular abscess 11/07/2017         Physical Exam   Triage Vital Signs: ED Triage Vitals  Encounter Vitals Group     BP 07/12/23 0720 123/81     Systolic BP Percentile --      Diastolic BP Percentile --      Pulse  Rate 07/12/23 0720 96     Resp 07/12/23 0720 18     Temp 07/12/23 0720 98.3 F (36.8 C)     Temp Source 07/12/23 0720 Oral     SpO2 07/12/23 0720 100 %     Weight 07/12/23 0728 198 lb 3.1 oz (89.9 kg)     Height 07/12/23 0728 5\' 10"  (1.778 m)     Head Circumference --      Peak Flow --      Pain Score 07/12/23 0719 0     Pain Loc --      Pain Education --      Exclude from Growth Chart --     Most recent vital signs: Vitals:   07/12/23 1400 07/12/23 1409  BP: 111/81 119/81  Pulse: 78 81  Resp: (!) 26 (!) 24  Temp:  97.7 F (36.5 C)  SpO2: 100% 100%    Physical Exam Vitals and nursing note reviewed.  Constitutional:      General: Awake and alert. No acute distress.    Appearance: Normal appearance. The patient is normal weight.  HENT:     Head: Normocephalic and atraumatic.     Mouth: Mucous membranes are moist.  Eyes:     General: PERRL. Normal EOMs        Right eye: No discharge.        Left  eye: No discharge.     Conjunctiva/sclera: Conjunctivae normal.  Cardiovascular:     Rate and Rhythm: Normal rate and regular rhythm.     Pulses: Normal pulses.  Pulmonary:     Effort: Pulmonary effort is normal. No respiratory distress.     Breath sounds: Normal breath sounds.  Abdominal:     Abdomen is soft. There is no abdominal tenderness. No rebound or guarding. No distention. Musculoskeletal:        General: No swelling. Normal range of motion.     Cervical back: Normal range of motion and neck supple.  Skin:    General: Skin is warm and dry.     Capillary Refill: Capillary refill takes less than 2 seconds.     Findings: PermCath in place Neurological:     Mental Status: The patient is awake and alert.      ED Results / Procedures / Treatments   Labs (all labs ordered are listed, but only abnormal results are displayed) Labs Reviewed  CBC WITH DIFFERENTIAL/PLATELET - Abnormal; Notable for the following components:      Result Value   WBC 17.1 (*)    RBC  2.73 (*)    Hemoglobin 8.4 (*)    HCT 25.6 (*)    Platelets 440 (*)    Neutro Abs 12.4 (*)    Monocytes Absolute 1.5 (*)    Abs Immature Granulocytes 0.09 (*)    All other components within normal limits  COMPREHENSIVE METABOLIC PANEL - Abnormal; Notable for the following components:   Sodium 134 (*)    Glucose, Bld 114 (*)    BUN 56 (*)    Creatinine, Ser 5.35 (*)    Total Protein 8.9 (*)    Albumin 3.4 (*)    GFR, Estimated 12 (*)    All other components within normal limits     EKG     RADIOLOGY     PROCEDURES:  Critical Care performed:   Procedures   MEDICATIONS ORDERED IN ED: Medications  Chlorhexidine Gluconate Cloth 2 % PADS 6 each (has no administration in time range)  epoetin alfa (EPOGEN) injection 10,000 Units (10,000 Units Intravenous Given 07/12/23 1321)     IMPRESSION / MDM / ASSESSMENT AND PLAN / ED COURSE  I reviewed the triage vital signs and the nursing notes.   Differential diagnosis includes, but is not limited to, acute on chronic kidney disease, end-stage renal disease, anemia of chronic disease, electrolyte disarray.  I reviewed the patient's chart.  Patient was seen in the emergency department on 06/28/2023 with a chief complaint of generalized weakness and dizziness.  He was found to have a serum creatinine of 4.18 at that time as well as a leukocytosis to 17.5.  He was admitted to the hospitalist service for acute on chronic kidney injury.  His baseline creatinine was 1.61 from 06/13/2023.  His creatinine further increased to 5.8H the following day and he also complained of testicular pain and was diagnosed with epididymitis, and the following day was noted to have a scrotal abscess with drainage.  He was hydrated aggressively with further worsening creatinine.  Vascular surgery was consulted for placement of permacath and this was performed on 07/02/2023 to the right IJ.  Patient received second dialysis treatment on 07/04/2023.  They trialed off  dialysis, and creatinine was found to be uptrending again.  He was also found to have a drop in his hemoglobin and was transfused.  On 07/07/2023 they discussed the option of  renal biopsy for further evaluation.  However, patient requested to leave AGAINST MEDICAL ADVICE on 07/09/2023, he agreed to stay for dialysis on 07/10/2023.  He did not undergo renal biopsy given that he is on Plavix.  He did not have a safe discharge plan and understood that he can return to the hospital for emergency dialysis care.  There is a phone call from yesterday saying that the patient was advised to become admitted at [facility] and start dialysis, however patient was unable to make it to [facility] and plans to come to [facility]  I discussed with Georgeanna Harrison and Dr. Cherylann Ratel both with nephrology who agreed to perform dialysis today.  They do not feel that he needs to be admitted.  They will continue to work with him for outpatient management.  Plan for patient to be brought to dialysis, and then back to the emergency department for discharge.  14:44 Patient returned from dialysis.  He has no complaints on reevaluation.  He was instructed by the nephrology team to return on Monday for his treatment.  He was instructed to hold off on his antihypertensive prior to dialysis.  Patient's presentation is most consistent with acute presentation with potential threat to life or bodily function.   Clinical Course as of 07/12/23 1445  Fri Jul 12, 2023  3086 Discussed with Dr. Cherylann Ratel and Wendee Beavers who are arranging dialysis for him and expect discharge after dialysis [JP]  1444 Patient has returned from dialysis, no complaints [JP]    Clinical Course User Index [JP] Azayla Polo, Herb Grays, PA-C     FINAL CLINICAL IMPRESSION(S) / ED DIAGNOSES   Final diagnoses:  ESRD (end stage renal disease) (HCC)     Rx / DC Orders   ED Discharge Orders     None        Note:  This document was prepared using Dragon voice  recognition software and may include unintentional dictation errors.   Jackelyn Hoehn, PA-C 07/12/23 1445    Janith Lima, MD 07/12/23 [phone]

## 2023-07-15 ENCOUNTER — Other Ambulatory Visit: Payer: Self-pay

## 2023-07-15 ENCOUNTER — Emergency Department
Admission: EM | Admit: 2023-07-15 | Discharge: 2023-07-15 | Disposition: A | Discharge status: Home / Self Care | Payer: Medicaid Other | Attending: Emergency Medicine | Admitting: Emergency Medicine

## 2023-07-15 DIAGNOSIS — Z992 Dependence on renal dialysis: Secondary | ICD-10-CM | POA: Diagnosis present

## 2023-07-15 LAB — COMPREHENSIVE METABOLIC PANEL
ALT: 26 U/L (ref 0–44)
AST: 27 U/L (ref 15–41)
Albumin: 3.6 g/dL (ref 3.5–5.0)
Alkaline Phosphatase: 131 U/L — ABNORMAL HIGH (ref 38–126)
Anion gap: 12 (ref 5–15)
BUN: 50 mg/dL — ABNORMAL HIGH (ref 6–20)
CO2: 22 mmol/L (ref 22–32)
Calcium: 9.4 mg/dL (ref 8.9–10.3)
Chloride: 99 mmol/L (ref 98–111)
Creatinine, Ser: 5.39 mg/dL — ABNORMAL HIGH (ref 0.61–1.24)
GFR, Estimated: 12 mL/min — ABNORMAL LOW (ref >60)
Glucose, Bld: 108 mg/dL — ABNORMAL HIGH (ref 70–99)
Potassium: 4 mmol/L (ref 3.5–5.1)
Sodium: 133 mmol/L — ABNORMAL LOW (ref 135–145)
Total Bilirubin: 0.8 mg/dL (ref 0.3–1.2)
Total Protein: 9.1 g/dL — ABNORMAL HIGH (ref 6.5–8.1)

## 2023-07-15 LAB — CBC WITH DIFFERENTIAL/PLATELET
Abs Immature Granulocytes: 0.07 10*3/uL (ref 0.00–0.07)
Basophils Absolute: 0.1 10*3/uL (ref 0.0–0.1)
Basophils Relative: 1 %
Eosinophils Absolute: 0 10*3/uL (ref 0.0–0.5)
Eosinophils Relative: 0 %
HCT: 27.4 % — ABNORMAL LOW (ref 39.0–52.0)
Hemoglobin: 8.8 g/dL — ABNORMAL LOW (ref 13.0–17.0)
Immature Granulocytes: 1 %
Lymphocytes Relative: 22 %
Lymphs Abs: 3.1 10*3/uL (ref 0.7–4.0)
MCH: 30.4 pg (ref 26.0–34.0)
MCHC: 32.1 g/dL (ref 30.0–36.0)
MCV: 94.8 fL (ref 80.0–100.0)
Monocytes Absolute: 1.1 10*3/uL — ABNORMAL HIGH (ref 0.1–1.0)
Monocytes Relative: 8 %
Neutro Abs: 9.7 10*3/uL — ABNORMAL HIGH (ref 1.7–7.7)
Neutrophils Relative %: 68 %
Platelets: 388 10*3/uL (ref 150–400)
RBC: 2.89 MIL/uL — ABNORMAL LOW (ref 4.22–5.81)
RDW: 13.3 % (ref 11.5–15.5)
WBC: 14.1 10*3/uL — ABNORMAL HIGH (ref 4.0–10.5)
nRBC: 0 % (ref 0.0–0.2)

## 2023-07-15 LAB — BRAIN NATRIURETIC PEPTIDE: B Natriuretic Peptide: 44.5 pg/mL (ref 0.0–100.0)

## 2023-07-15 MED ORDER — AMOXICILLIN-POT CLAVULANATE 500-125 MG PO TABS
1.0000 | ORAL_TABLET | Freq: Every day | ORAL | 0 refills | Status: AC
  Filled 2023-07-15: qty 7, 7d supply, fill #0

## 2023-07-15 NOTE — ED Notes (Signed)
See triage notes. Patient here for dialysis treatment.

## 2023-07-15 NOTE — ED Triage Notes (Signed)
Pt comes with needing dialysis treatment. Pt denies any current needs.

## 2023-07-15 NOTE — ED Provider Notes (Signed)
[facility] Provider Note   Event Date/Time   First MD Initiated Contact with Patient 07/15/23 [phone]     (approximate) History  dialysis treatment  HPI Gary [name] is a 52 y.o. male with recent diagnosis of acute kidney injury with permacath placed prior to discharge approximately 2 weeks ago who presents stating that he needs dialysis treatment.  Patient states that he was seen here on Friday and received a dialysis treatment at this time however due to financial constraints, he is unable to follow-up in the outpatient setting and therefore was told to return today for another treatment of dialysis.   ROS: Patient currently denies any vision changes, tinnitus, difficulty speaking, facial droop, sore throat, chest pain, shortness of breath, abdominal pain, nausea/vomiting/diarrhea, dysuria, or weakness/numbness/paresthesias in any extremity   Physical Exam  Triage Vital Signs: ED Triage Vitals  Encounter Vitals Group     BP 07/15/23 0738 117/71     Systolic BP Percentile --      Diastolic BP Percentile --      Pulse Rate 07/15/23 0738 100     Resp 07/15/23 0738 19     Temp 07/15/23 0738 98 F (36.7 C)     Temp src --      SpO2 07/15/23 0738 95 %     Weight 07/15/23 0757 200 lb 6.4 oz (90.9 kg)     Height 07/15/23 0757 5\' 10"  (1.778 m)     Head Circumference --      Peak Flow --      Pain Score 07/15/23 0738 0     Pain Loc --      Pain Education --      Exclude from Growth Chart --    Most recent vital signs: Vitals:   07/15/23 0738  BP: 117/71  Pulse: 100  Resp: 19  Temp: 98 F (36.7 C)  SpO2: 95%   General: Awake, oriented x4. CV:  Good peripheral perfusion.  Resp:  Normal effort.  Abd:  No distention.  Other:  Middle-aged overweight African-American male resting comfortably in no acute distress ED Results / Procedures / Treatments  Labs (all labs ordered are listed, but only abnormal results are displayed) Labs Reviewed  CBC WITH  DIFFERENTIAL/PLATELET - Abnormal; Notable for the following components:      Result Value   WBC 14.1 (*)    RBC 2.89 (*)    Hemoglobin 8.8 (*)    HCT 27.4 (*)    Neutro Abs 9.7 (*)    Monocytes Absolute 1.1 (*)    All other components within normal limits  COMPREHENSIVE METABOLIC PANEL - Abnormal; Notable for the following components:   Sodium 133 (*)    Glucose, Bld 108 (*)    BUN 50 (*)    Creatinine, Ser 5.39 (*)    Total Protein 9.1 (*)    Alkaline Phosphatase 131 (*)    GFR, Estimated 12 (*)    All other components within normal limits  BRAIN NATRIURETIC PEPTIDE   PROCEDURES: Critical Care performed: No Procedures MEDICATIONS ORDERED IN ED: Medications - No data to display IMPRESSION / MDM / ASSESSMENT AND PLAN / ED COURSE  I reviewed the triage vital signs and the nursing notes.                             The patient is on the cardiac monitor to evaluate for evidence of arrhythmia and/or significant  heart rate changes. Patient's presentation is most consistent with acute presentation with potential threat to life or bodily function. Patient 52 year old male with a recent diagnosis of acute renal failure on dialysis with permacath in place however unable to present to dialysis center secondary to financial constraints.  Patient states that he is still waiting on Medicaid funding for his dialysis.  I spoke to Dr. Wynelle Link and nephrology who agrees that since patient's labs show no signs of needing emergent dialysis (potassium 4.0, BNP 44.5) and patient shows no clinical signs of volume overload, he is stable for discharge at this time to follow-up in 2 days with his nephrology team for dialysis.  Dispo: Discharge home with nephrology follow-up   FINAL CLINICAL IMPRESSION(S) / ED DIAGNOSES   Final diagnoses:  None   Rx / DC Orders   ED Discharge Orders     None      Note:  This document was prepared using Dragon voice recognition software and may include  unintentional dictation errors.   Merwyn Katos, MD 07/15/23 [phone]

## 2023-07-15 NOTE — ED Notes (Signed)
See triage note  Presents for dialysis treatment  Denies any new complaints  

## 2023-07-16 ENCOUNTER — Encounter: Payer: Self-pay | Admitting: Gerontology

## 2023-07-16 ENCOUNTER — Ambulatory Visit: Payer: Self-pay | Admitting: Gerontology

## 2023-07-16 VITALS — BP 93/61 | HR 96 | Wt 199.1 lb

## 2023-07-16 DIAGNOSIS — R42 Dizziness and giddiness: Secondary | ICD-10-CM | POA: Insufficient documentation

## 2023-07-16 DIAGNOSIS — R569 Unspecified convulsions: Secondary | ICD-10-CM

## 2023-07-16 DIAGNOSIS — Z992 Dependence on renal dialysis: Secondary | ICD-10-CM | POA: Insufficient documentation

## 2023-07-16 NOTE — Progress Notes (Unsigned)
Established Patient Office Visit  Subjective   Patient ID: Gary [name], male    DOB: 01/04/1971  Age: 52 y.o. MRN: [id number]  No chief complaint on file.   HPI  Gary [name] is a a 52 y/o with a history of HTN, hyperlipidemia, seizure disorder, CVA, TIA, alcohol use, tobacco use, and GERD who presents for follow up . He was discharged from the hospital, with   He states that his Keppra was decreased to 500 mg daily and since discharge, he's been having seizures, last one was on 07/12/23, didn't loose conciousness. He states he has been taking only 500 mg of Keppra daily and didn't understand the discharge instruction.He reports experiences intermittent dizzyness  Review of Systems  Constitutional: Negative.   Eyes: Negative.   Respiratory: Negative.    Cardiovascular: Negative.   Neurological:  Positive for dizziness.  Psychiatric/Behavioral: Negative.        Objective:     There were no vitals taken for this visit. BP Readings from Last 3 Encounters:  07/15/23 117/71  07/12/23 119/81  07/10/23 128/87   Wt Readings from Last 3 Encounters:  07/15/23 200 lb 6.4 oz (90.9 kg)  07/12/23 200 lb 6.4 oz (90.9 kg)  07/10/23 198 lb 3.1 oz (89.9 kg)      Physical Exam   No results found for any visits on 07/16/23.  Last CBC Lab Results  Component Value Date   WBC 14.1 (H) 07/15/2023   HGB 8.8 (L) 07/15/2023   HCT 27.4 (L) 07/15/2023   MCV 94.8 07/15/2023   MCH 30.4 07/15/2023   RDW 13.3 07/15/2023   PLT 388 07/15/2023   Last metabolic panel Lab Results  Component Value Date   GLUCOSE 108 (H) 07/15/2023   NA 133 (L) 07/15/2023   K 4.0 07/15/2023   CL 99 07/15/2023   CO2 22 07/15/2023   BUN 50 (H) 07/15/2023   CREATININE 5.39 (H) 07/15/2023   GFRNONAA 12 (L) 07/15/2023   CALCIUM 9.4 07/15/2023   PHOS 4.8 (H) 07/10/2023   PROT 9.1 (H) 07/15/2023   ALBUMIN 3.6 07/15/2023   LABGLOB 4.6 (H) 07/06/2023   AGRATIO 0.7 06/30/2023   BILITOT 0.8 07/15/2023    ALKPHOS 131 (H) 07/15/2023   AST 27 07/15/2023   ALT 26 07/15/2023   ANIONGAP 12 07/15/2023   Last lipids Lab Results  Component Value Date   CHOL 127 06/13/2023   HDL 39 (L) 06/13/2023   LDLCALC 68 06/13/2023   TRIG 108 06/13/2023   CHOLHDL 3.3 06/13/2023   Last hemoglobin A1c Lab Results  Component Value Date   HGBA1C 5.5 03/07/2023   Last thyroid functions Lab Results  Component Value Date   TSH 1.400 02/02/2021   Last vitamin D Lab Results  Component Value Date   VD25OH 23.22 (L) 07/04/2023   Last vitamin B12 and Folate Lab Results  Component Value Date   VITAMINB12 1,313 (H) 07/04/2023   FOLATE 16.2 07/03/2023      The ASCVD Risk score (Arnett DK, et al., 2019) failed to calculate for the following reasons:   The patient has a prior MI or stroke diagnosis    Assessment & Plan:   Problem List Items Addressed This Visit   None   No follow-ups on file.    Petrea Fredenburg Trellis Paganini, NP

## 2023-07-17 ENCOUNTER — Emergency Department
Admission: EM | Admit: 2023-07-17 | Discharge: 2023-07-17 | Disposition: A | Discharge status: Home / Self Care | Payer: Medicaid Other | Attending: Emergency Medicine | Admitting: Emergency Medicine

## 2023-07-17 ENCOUNTER — Other Ambulatory Visit: Payer: Self-pay

## 2023-07-17 DIAGNOSIS — Z5329 Procedure and treatment not carried out because of patient's decision for other reasons: Secondary | ICD-10-CM | POA: Insufficient documentation

## 2023-07-17 DIAGNOSIS — R55 Syncope and collapse: Secondary | ICD-10-CM | POA: Insufficient documentation

## 2023-07-17 DIAGNOSIS — N186 End stage renal disease: Secondary | ICD-10-CM | POA: Insufficient documentation

## 2023-07-17 DIAGNOSIS — Z992 Dependence on renal dialysis: Secondary | ICD-10-CM | POA: Insufficient documentation

## 2023-07-17 HISTORY — DX: Disorder of kidney and ureter, unspecified: N28.9

## 2023-07-17 LAB — BASIC METABOLIC PANEL
Anion gap: 13 (ref 5–15)
BUN: 49 mg/dL — ABNORMAL HIGH (ref 6–20)
CO2: 19 mmol/L — ABNORMAL LOW (ref 22–32)
Calcium: 9.6 mg/dL (ref 8.9–10.3)
Chloride: 101 mmol/L (ref 98–111)
Creatinine, Ser: 4.63 mg/dL — ABNORMAL HIGH (ref 0.61–1.24)
GFR, Estimated: 14 mL/min — ABNORMAL LOW (ref >60)
Glucose, Bld: 118 mg/dL — ABNORMAL HIGH (ref 70–99)
Potassium: 4.2 mmol/L (ref 3.5–5.1)
Sodium: 133 mmol/L — ABNORMAL LOW (ref 135–145)

## 2023-07-17 LAB — URINALYSIS, ROUTINE W REFLEX MICROSCOPIC
Bacteria, UA: NONE SEEN
Bilirubin Urine: NEGATIVE
Glucose, UA: NEGATIVE mg/dL
Ketones, ur: NEGATIVE mg/dL
Leukocytes,Ua: NEGATIVE
Nitrite: NEGATIVE
Protein, ur: NEGATIVE mg/dL
Specific Gravity, Urine: 1.014 (ref 1.005–1.030)
pH: 6 (ref 5.0–8.0)

## 2023-07-17 LAB — CBC
HCT: 26.7 % — ABNORMAL LOW (ref 39.0–52.0)
Hemoglobin: 8.9 g/dL — ABNORMAL LOW (ref 13.0–17.0)
MCH: 30.8 pg (ref 26.0–34.0)
MCHC: 33.3 g/dL (ref 30.0–36.0)
MCV: 92.4 fL (ref 80.0–100.0)
Platelets: 413 10*3/uL — ABNORMAL HIGH (ref 150–400)
RBC: 2.89 MIL/uL — ABNORMAL LOW (ref 4.22–5.81)
RDW: 13.2 % (ref 11.5–15.5)
WBC: 12 10*3/uL — ABNORMAL HIGH (ref 4.0–10.5)
nRBC: 0 % (ref 0.0–0.2)

## 2023-07-17 NOTE — ED Notes (Signed)
Dialysis RN at bedside to [facility] [place].

## 2023-07-17 NOTE — ED Notes (Signed)
Pt states was suppose to have Dialysis on Monday but was bumped, pt states he has had a couple bouts of dizziness and passing out over the last week. Pt states his PCP took him off a BP med due to causing his BP to drop to low.

## 2023-07-17 NOTE — ED Provider Notes (Signed)
[facility] Provider Note    Event Date/Time   First MD Initiated Contact with Patient 07/17/23 0915     (approximate)   History   Loss of Consciousness   HPI  Gary [name] is a 52 y.o. male with a history of somewhat recent end-stage renal disease, permacath, hemodialysis needs   Patient reports that he does not yet have a hemodialysis center, he has pending insurance.  He came today for hemodialysis, was told to come back today.  He was supposed to have dialysis Monday but it did not occur is no slots available  Patient also reports that he Friday he had a brief episode where he passed out.  He saw his doctor Monday and his blood pressure was low they stopped one of his medications for blood pressure, and he had 1 further episode where he did not become injured but as he was answering the door he got up and felt lightheaded.  As he stood to unlock the door he got lightheaded and briefly passed out.  He does have a history of seizures as well but did not bite his tongue or urinate on himself and he awoke on the floor her right away.  He did not seek EMS care at that time  He has had no headache no chest pain.  No difficulty breathing.  Presents today for his dialysis treatment.  Takes no blood thinners other than aspirin and Plavix.  Does not take any anticoagulant.  Denies any head injury  Physical Exam   Triage Vital Signs: ED Triage Vitals  Encounter Vitals Group     BP 07/17/23 0821 123/82     Systolic BP Percentile --      Diastolic BP Percentile --      Pulse Rate 07/17/23 0824 85     Resp 07/17/23 0821 19     Temp 07/17/23 0821 98.1 F (36.7 C)     Temp Source 07/17/23 0821 Oral     SpO2 07/17/23 0821 95 %     Weight 07/17/23 0823 191 lb (86.6 kg)     Height 07/17/23 0823 5\' 11"  (1.803 m)     Head Circumference --      Peak Flow --      Pain Score 07/17/23 0823 0     Pain Loc --      Pain Education --      Exclude from Growth Chart --      Most recent vital signs: Vitals:   07/17/23 1030 07/17/23 1115  BP: 119/82 120/83  Pulse: 78 77  Resp: 18   Temp:    SpO2: 100% 100%     General: Awake, no distress.  Conversant, pleasant CV:  Good peripheral perfusion.  Normal tones and rate.  Permacath right upper subclavian chest clean dry intact Resp:  Normal effort.  Clear bilateral Abd:  No distention.  Other:  No noted edema in the extremities bilateral.  Patient resting comfortably with normal work of breathing fully alert well-oriented   ED Results / Procedures / Treatments   Labs (all labs ordered are listed, but only abnormal results are displayed) Labs Reviewed  BASIC METABOLIC PANEL - Abnormal; Notable for the following components:      Result Value   Sodium 133 (*)    CO2 19 (*)    Glucose, Bld 118 (*)    BUN 49 (*)    Creatinine, Ser 4.63 (*)    GFR, Estimated 14 (*)  All other components within normal limits  CBC - Abnormal; Notable for the following components:   WBC 12.0 (*)    RBC 2.89 (*)    Hemoglobin 8.9 (*)    HCT 26.7 (*)    Platelets 413 (*)    All other components within normal limits  URINALYSIS, ROUTINE W REFLEX MICROSCOPIC - Abnormal; Notable for the following components:   Color, Urine YELLOW (*)    APPearance CLEAR (*)    Hgb urine dipstick SMALL (*)    All other components within normal limits  CBG MONITORING, ED   Labs demonstrate fair stability in fact slight improvement in creatinine.  Minimal cytosis  EKG  Interpreted by me at 830 heart rate 80 QRS 90 QTc 430 Normal sinus rhythm, no evidence of acute ischemia   RADIOLOGY  No results found.   No noted indication for imaging.  No dyspnea.  No pulmonary symptoms.  Patient also denies any headache neurologic symptoms, reports simple syncopal type episode.   PROCEDURES:  Critical Care performed: No  Procedures   MEDICATIONS ORDERED IN ED: Medications - No data to display   IMPRESSION / MDM / ASSESSMENT AND  PLAN / ED COURSE  I reviewed the triage vital signs and the nursing notes.                              Differential diagnosis includes, but is not limited to, end-stage renal disease on hemodialysis with hemodialysis needed today.  He does not have volume overload or evidence to support a need for emergent dialysis.  I have consulted and patient has been seen by Dr. Ronn Melena, who also discussed with the patient and encouraged admission for further evaluation due to his episodes of passing out.  However both of myself as well as nephrology the patient declines admission refuses further workup on this issue at this time.  He is fully alert well-oriented normocephalic atraumatic.  He is declining to allow further workup such as hospitalization cardiac monitoring or evaluation for his syncope at this time.  I discussed with him via shared medical decision making, he clearly has capacity to understand, and is able to voice back and understand my concerns that he could have another episode of passing out, become injured, potentially even have complications such as low oxygen, heart attack or death.  Patient declines further treatment citing that he is well, this has happened to him many times in the past, does not wish to be further evaluated at this time.  He is amenable to following up with nephrology outpatient.  Dr. Wynelle Link after seeing him and evaluating advised his renal function seems to be improving and that he does not have a dialysis need today but will follow-up closely with him in the clinic for which his clinic will call him to arrange follow-up  Patient's presentation is most consistent with acute complicated illness / injury requiring diagnostic workup.  Labs demonstrate chronic anemia.   Patient leaving AGAINST MEDICAL ADVICE        FINAL CLINICAL IMPRESSION(S) / ED DIAGNOSES   Final diagnoses:  ESRD (end stage renal disease) (HCC)  Syncope and collapse     Rx / DC Orders    ED Discharge Orders     None        Note:  This document was prepared using Dragon voice recognition software and may include unintentional dictation errors.   Sharyn Creamer, MD 07/17/23 1157

## 2023-07-17 NOTE — ED Triage Notes (Signed)
Pt presents to ED for c/o of routine dialysis but then states he has had a few syncopal episodes. Pt states HX of seizures. NAD noted.

## 2023-07-17 NOTE — Discharge Instructions (Signed)
You have elected to leave the hospital AGAINST MEDICAL ADVICE.  Call 911, return to the ER right away if you feel faint, passout again, have a seizure, fall, become injured or other incidents arise of concern.  I strongly recommend against driving a vehicle until cleared by primary care or another physician.  You are on the risk of passing out crashing and injuring yourself or others.  Please return to the ER right away at any time.  We recommended that you be admitted and stay in the hospital for further treatment and evaluation to the episodes of passing out.  It is imperative that you follow-up closely with your nephrologist.  I have discussed this plan with you.

## 2023-07-17 NOTE — Progress Notes (Signed)
[facility]  ROUNDING NOTE   Subjective:   Gary [name] is a 52 y.o. male who recently was admitted for acute kidney injury and initiated hemodialysis. Do to lack of insurance, he is unable to be placed in outpatient clinic at this time.  He returns to ED for dialysis treatments. He presented on Monday, but due to stable labs he was discharged.   He returns to the ED today for dialysis and complaints of passing out. Last dialysis treatment completed on Friday. Remains on room air  No lower extremity edema  Objective:  Vital signs in last 24 hours:  Temp:  [98.1 F (36.7 C)] 98.1 F (36.7 C) (10/09 0821) Pulse Rate:  [77-96] 77 (10/09 1115) Resp:  [18-19] 18 (10/09 1030) BP: (93-123)/(61-87) 120/83 (10/09 1115) SpO2:  [95 %-100 %] 100 % (10/09 1115) Weight:  [86.6 kg-90.3 kg] 86.6 kg (10/09 0823)  Weight change:  Filed Weights   07/17/23 0823  Weight: 86.6 kg    Intake/Output: No intake/output data recorded.   Intake/Output this shift:  No intake/output data recorded.  Physical Exam: General: NAD  Head: Normocephalic, atraumatic. Moist oral mucosal membranes  Eyes: Anicteric  Lungs:  Clear to auscultation normal effort  Heart: Regular rate and rhythm  Abdomen:  Soft, nontender  Extremities: No peripheral edema.  Neurologic: Alert, moving all four extremities  Skin: No lesions.    Access: Rt chest permcath placed on 07/02/23 by Dr Wyn Quaker    Basic Metabolic Panel: Recent Labs  Lab 07/12/23 0824 07/15/23 0900 07/17/23 0826  NA 134* 133* 133*  K 3.9 4.0 4.2  CL 99 99 101  CO2 24 22 19*  GLUCOSE 114* 108* 118*  BUN 56* 50* 49*  CREATININE 5.35* 5.39* 4.63*  CALCIUM 9.1 9.4 9.6    Liver Function Tests: Recent Labs  Lab 07/12/23 0824 07/15/23 0900  AST 34 27  ALT 32 26  ALKPHOS 126 131*  BILITOT 0.5 0.8  PROT 8.9* 9.1*  ALBUMIN 3.4* 3.6   No results for input(s): "LIPASE", "AMYLASE" in the last 168 hours. No results for input(s):  "AMMONIA" in the last 168 hours.  CBC: Recent Labs  Lab 07/12/23 0824 07/15/23 0900 07/17/23 0826  WBC 17.1* 14.1* 12.0*  NEUTROABS 12.4* 9.7*  --   HGB 8.4* 8.8* 8.9*  HCT 25.6* 27.4* 26.7*  MCV 93.8 94.8 92.4  PLT 440* 388 413*    Cardiac Enzymes: No results for input(s): "CKTOTAL", "CKMB", "CKMBINDEX", "TROPONINI" in the last 168 hours.   BNP: Invalid input(s): "POCBNP"  CBG: No results for input(s): "GLUCAP" in the last 168 hours.  Microbiology: Results for orders placed or performed during the hospital encounter of 06/28/23  Blood culture (single)     Status: None   Collection Time: 06/28/23 12:22 PM   Specimen: BLOOD  Result Value Ref Range Status   Specimen Description BLOOD LEFT ANTECUBITAL  Final   Special Requests   Final    BOTTLES DRAWN AEROBIC AND ANAEROBIC Blood Culture adequate volume   Culture   Final    NO GROWTH 5 DAYS Performed at [facility], [address] [place], [place] [address]    Report Status 07/03/2023 FINAL  Final  SARS Coronavirus 2 by RT PCR (hospital order, performed in [facility] hospital lab) *cepheid single result test* Anterior Nasal Swab     Status: None   Collection Time: 06/29/23 12:15 AM   Specimen: Anterior Nasal Swab  Result Value Ref Range Status  SARS Coronavirus 2 by RT PCR NEGATIVE NEGATIVE Final    Comment: (NOTE) SARS-CoV-2 target nucleic acids are NOT DETECTED.  The SARS-CoV-2 RNA is generally detectable in upper and lower respiratory specimens during the acute phase of infection. The lowest concentration of SARS-CoV-2 viral copies this assay can detect is 250 copies / mL. A negative result does not preclude SARS-CoV-2 infection and should not be used as the sole basis for treatment or other patient management decisions.  A negative result may occur with improper specimen collection / handling, submission of specimen other than nasopharyngeal swab, presence of viral mutation(s) within the areas  targeted by this assay, and inadequate number of viral copies (<250 copies / mL). A negative result must be combined with clinical observations, patient history, and epidemiological information.  Fact Sheet for Patients:   RoadLapTop.co.za  Fact Sheet for Healthcare Providers: http://kim-miller.com/  This test is not yet approved or  cleared by the [place] FDA and has been authorized for detection and/or diagnosis of SARS-CoV-2 by FDA under an Emergency Use Authorization (EUA).  This EUA will remain in effect (meaning this test can be used) for the duration of the COVID-19 declaration under Section 564(b)(1) of the Act, 21 U.S.C. section 360bbb-3(b)(1), unless the authorization is terminated or revoked sooner.  Performed at [facility], [address] [place], [place] [address]   Chlamydia/NGC rt PCR [facility] only)     Status: None   Collection Time: 06/29/23 10:42 AM   Specimen: Urine; GU  Result Value Ref Range Status   Specimen source GC/Chlam URINE, RANDOM  Final   Chlamydia Tr NOT DETECTED NOT DETECTED Final   N gonorrhoeae NOT DETECTED NOT DETECTED Final    Comment: (NOTE) This CT/NG assay has not been evaluated in patients with a history of  hysterectomy. Performed at [facility], [address] [place], [place] [address]     Coagulation Studies: No results for input(s): "LABPROT", "INR" in the last 72 hours.   Urinalysis: Recent Labs    07/17/23 1113  COLORURINE YELLOW*  LABSPEC 1.014  PHURINE 6.0  GLUCOSEU NEGATIVE  HGBUR SMALL*  BILIRUBINUR NEGATIVE  KETONESUR NEGATIVE  PROTEINUR NEGATIVE  NITRITE NEGATIVE  LEUKOCYTESUR NEGATIVE       Imaging: No results found.   Medications:         Assessment/ Plan:  Gary [name] is a 52 y.o.  male with medical problems of hypertension, alcohol abuse, hyperlipidemia, history of TIA, GERD, erectile dysfunction, tobacco abuse,  history of seizures  was admitted on 06/28/2023 for dialysis   Acute kidney injury on chronic kidney disease stage IIIa/proteinuria. AKI secondary to severe ATN with chronic NSAID use HTN with chronic kidney disease Anemia with chronic kidney disease  Lab Results  Component Value Date   CREATININE 4.63 (H) 07/17/2023   CREATININE 5.39 (H) 07/15/2023   CREATININE 5.35 (H) 07/12/2023   No intake or output data in the 24 hours ending 07/17/23 1153    Plan: Last dialysis treatment completed on Friday Continues to have adequate urine output, per patient Creatinine showing spontaneous improvement.  Will continue to monitor for renal recovery outpatient HD RN to provide dressing change and flush to permcath Blood pressure stable, recently taken off an antihypertensive by PCP.  Hgb 8.9   LOS: 0 Gary [name] 10/9/202411:53 AM

## 2023-07-17 NOTE — ED Notes (Signed)
Pt ambulated to bathroom with a steady gate.

## 2023-07-26 ENCOUNTER — Encounter
Admission: EM | Disposition: A | Discharge status: Home / Self Care | Payer: Self-pay | Source: Home / Self Care | Attending: Emergency Medicine

## 2023-07-26 ENCOUNTER — Other Ambulatory Visit: Payer: Self-pay

## 2023-07-26 ENCOUNTER — Ambulatory Visit
Admission: EM | Admit: 2023-07-26 | Discharge: 2023-07-26 | Disposition: A | Discharge status: Home / Self Care | Payer: Medicaid Other | Attending: Vascular Surgery | Admitting: Vascular Surgery

## 2023-07-26 DIAGNOSIS — Z992 Dependence on renal dialysis: Secondary | ICD-10-CM | POA: Diagnosis not present

## 2023-07-26 DIAGNOSIS — Z452 Encounter for adjustment and management of vascular access device: Secondary | ICD-10-CM | POA: Diagnosis present

## 2023-07-26 DIAGNOSIS — N186 End stage renal disease: Secondary | ICD-10-CM | POA: Insufficient documentation

## 2023-07-26 HISTORY — PX: DIALYSIS/PERMA CATHETER REMOVAL: CATH118289

## 2023-07-26 SURGERY — DIALYSIS/PERMA CATHETER REMOVAL
Anesthesia: LOCAL | Laterality: Right

## 2023-07-26 NOTE — ED Triage Notes (Addendum)
Patient arrives to have "Right chest dialysis catheter removed".  States he called his Kidney doctor and they referred him to the ED to have it removed. States the Kidney Doctor is expecting him. (Patient called office, states Dr. Erma Heritage is expecting him)  Does not receive hemodialysis.  States kidney function has improved.

## 2023-07-26 NOTE — Discharge Instructions (Signed)
You were seen in the ED for catheter removal.  Your catheter was successfully removed by vascular surgery.  Please keep area around dressing clean.  There are some general instructions to follow:  After 24 hours you can gently wash the area with soap and water. Avoid heavy lifting for 3 days.  Not lift anything heavier than 10 pounds. Avoid baths and pools for 1 to 2 weeks. Check for signs of infection daily at catheter site such as pain, tenderness, swelling drainage, pus, redness and warmth.  Follow-up with nephrology for recheck.

## 2023-07-26 NOTE — Plan of Care (Signed)
[facility] Tonsillectomy/Adenoidectomy, Postoperative PEDS care plan entered in error.

## 2023-07-26 NOTE — ED Notes (Signed)
Patient is resting on stretcher. Patient is calm, no acute distress. Dressing over dialysis catheter in right chest is loose.

## 2023-07-26 NOTE — ED Notes (Signed)
Patient discharged by Rolla Plate NP. Patient was seen by Kern Reap PA-C upon discharge. Patient declined discharge vital signs. Patient ambulated from department with a steady gait. Denied pain.

## 2023-07-26 NOTE — ED Notes (Signed)
Vascular NP at bedside

## 2023-07-26 NOTE — ED Provider Notes (Signed)
[facility] Emergency Department Provider Note     Event Date/Time   First MD Initiated Contact with Patient 07/26/23 1432     (approximate)   History   medical device   HPI  Gary [name] is a 52 y.o. male who presents to the ED for removal of dialysis/permanent catheter that was placed on 09/24 for HD for AKI.  His kidney function has since recovered per Dr. Claudia Desanctis of nephrology who has instructed patient to come to ED for this procedure.  Patient reports discomfort to this catheter and would like it removed.  Patient is asymptomatic otherwise.  Denies fever, chest pain and shortness of breath.      Physical Exam   Triage Vital Signs: ED Triage Vitals  Encounter Vitals Group     BP 07/26/23 1127 115/63     Systolic BP Percentile --      Diastolic BP Percentile --      Pulse Rate 07/26/23 1127 93     Resp 07/26/23 1127 16     Temp 07/26/23 1127 98.5 F (36.9 C)     Temp Source 07/26/23 1127 Oral     SpO2 07/26/23 1127 100 %     Weight 07/26/23 1125 190 lb 14.7 oz (86.6 kg)     Height 07/26/23 1125 5\' 11"  (1.803 m)     Head Circumference --      Peak Flow --      Pain Score 07/26/23 1125 0     Pain Loc --      Pain Education --      Exclude from Growth Chart --     Most recent vital signs: Vitals:   07/26/23 1127  BP: 115/63  Pulse: 93  Resp: 16  Temp: 98.5 F (36.9 C)  SpO2: 100%   General Well-appearing. Awake, no distress.  HEENT NCAT. PERRL. EOMI. No rhinorrhea. Mucous membranes are moist.  CV:  Good peripheral perfusion.  RESP:  Normal effort.  ABD:  No distention.  Other:  Tunneled hemodialysis catheter intact right jugular vein over right chest.    ED Results / Procedures / Treatments   Labs (all labs ordered are listed, but only abnormal results are displayed) Labs Reviewed - No data to display  PERIPHERAL VASCULAR CATHETERIZATION  Result Date: 07/26/2023 See surgical note for result.    PROCEDURES:  Critical Care performed: No  Procedures  MEDICATIONS ORDERED IN ED: Medications - No data to display  IMPRESSION / MDM / ASSESSMENT AND PLAN / ED COURSE  I reviewed the triage vital signs and the nursing notes.                               52 y.o. male presents to the emergency department for evaluation and treatment of peripheral vascular catheterization removal. See HPI for further details.   Patient's presentation is most consistent with exacerbation of chronic illness.  Patient is alert and oriented.  He is hemodynamically stable.  Tunneled hemodialysis catheter placed and intact to right jugular vein over right chest.  Rolla Plate, NP vascular surgery performed peripheral vascular catheterization removal in ED. successful removal.  Patient is in stable condition for discharge home.  He is encouraged to follow-up with nephrology on his scheduled appointment next week.  ED precautions are discussed.  Patient verbalized understanding.  All questions and concerns were addressed during this ED visit.     FINAL CLINICAL  IMPRESSION(S) / ED DIAGNOSES   Final diagnoses:  Encounter for removal of vascular catheter   Rx / DC Orders   ED Discharge Orders     None      Note:  This document was prepared using Dragon voice recognition software and may include unintentional dictation errors.    Romeo Apple, Nashla Althoff A, PA-C 07/26/23 1652    Chesley Noon, MD 07/26/23 [phone]

## 2023-07-30 ENCOUNTER — Encounter: Payer: Self-pay | Admitting: Vascular Surgery

## 2023-08-01 ENCOUNTER — Other Ambulatory Visit: Payer: Self-pay

## 2023-08-01 ENCOUNTER — Encounter: Payer: Self-pay | Admitting: Gerontology

## 2023-08-01 ENCOUNTER — Ambulatory Visit: Payer: Self-pay | Admitting: Gerontology

## 2023-08-01 VITALS — BP 105/69 | HR 90 | Ht 71.0 in | Wt 202.3 lb

## 2023-08-01 DIAGNOSIS — I10 Essential (primary) hypertension: Secondary | ICD-10-CM

## 2023-08-01 DIAGNOSIS — L84 Corns and callosities: Secondary | ICD-10-CM | POA: Insufficient documentation

## 2023-08-01 DIAGNOSIS — N1831 Chronic kidney disease, stage 3a: Secondary | ICD-10-CM | POA: Insufficient documentation

## 2023-08-01 DIAGNOSIS — G6289 Other specified polyneuropathies: Secondary | ICD-10-CM

## 2023-08-01 DIAGNOSIS — I739 Peripheral vascular disease, unspecified: Secondary | ICD-10-CM | POA: Insufficient documentation

## 2023-08-01 MED ORDER — CARVEDILOL 12.5 MG PO TABS
12.5000 mg | ORAL_TABLET | Freq: Two times a day (BID) | ORAL | 0 refills | Status: DC
Start: 2023-08-01 — End: 2023-09-12
  Filled 2023-08-01: qty 60, 30d supply, fill #0

## 2023-08-01 MED ORDER — GABAPENTIN 100 MG PO CAPS
100.0000 mg | ORAL_CAPSULE | Freq: Every day | ORAL | 0 refills | Status: DC
Start: 2023-08-01 — End: 2023-09-12
  Filled 2023-08-01: qty 30, 30d supply, fill #0

## 2023-08-01 NOTE — Progress Notes (Signed)
Established Patient Office Visit  Subjective   Patient ID: [name] [name], male    DOB: Feb 01, 1971  Age: 52 y.o. MRN: [id number]  No chief complaint on file.   Foot Pain  Manning Wilbanks is a 52 y/o male with a past medical history of HTN, hyperlipidemia, seizure disorder, CVA, TIA, alcohol use, tobacco use, GERD and acute kidney injury who presents for follow up. Per nephrologist Dr. Claudia Desanctis his kidney function has improved. His dialysis/ permanent cathter was removed with no complications on 07/26/2023 in the ED at [facility]. His nephrology follow up appointment is 08/26/2023.  BUN has decreased from 38 mg/dL to 32 mg/dl .Serum creatinine has decreased from 5.38 mg/dL 2.7 mg/dL. eGFR  has increased from 12 mL/min to 28 mL/min. In addition his Nephrologist ,his carvedilol was decreased  from 25 mg to 12.5 mg due to dizziness. He states that he is not experiencing hypotensive symptoms at this time.  He states that he is experieincing tingling sensation and claudication to his bilateral calves and left foot pain due to chronic callus, that has worsened since he has returned back to work on 101/21/2024. Feet and legs 8/10 on the pain scale. He states that the tingling and numbness improves when sitting and removing shoes. He states that he is taking tyneol with no relief. He was seen by Dr Moishe Spice on 04/06/2021 with regards to his bilateral calf pain. 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 He denies seizure activities since last visit. He states that he is complaint with his medications. Overall, he is doing well and offers no further complaints at this time.    Review of Systems  Constitutional: Negative.    HENT: Negative.    Eyes: Negative.   Respiratory: Negative.    Cardiovascular: Negative.   Gastrointestinal: Negative.   Genitourinary: Negative.   Musculoskeletal: Negative.   Skin: Negative.   Neurological: Negative.   Endo/Heme/Allergies: Negative.   Psychiatric/Behavioral: Negative.        Objective:     There were no vitals taken for this visit. BP Readings from Last 3 Encounters:  07/26/23 115/63  08/01/23 105/69  07/17/23 120/83   Wt Readings from Last 3 Encounters:  07/26/23 190 lb 14.7 oz (86.6 kg)  08/01/23 202 lb 4.8 oz (91.8 kg)  07/17/23 191 lb (86.6 kg)      Physical Exam Constitutional:      Appearance: Normal appearance. He is normal weight.  HENT:     Head: Normocephalic.     Mouth/Throat:     Mouth: Mucous membranes are moist.     Pharynx: Oropharynx is clear.  Eyes:     Extraocular Movements: Extraocular movements intact.     Conjunctiva/sclera: Conjunctivae normal.     Pupils: Pupils are equal, round, and reactive to light.  Cardiovascular:     Rate and Rhythm: Normal rate and regular rhythm.     Pulses: Normal pulses.          Dorsalis pedis pulses are 2+ on the right side and 2+ on the left side.       Posterior tibial pulses are 2+ on the right side and 2+ on the left side.     Heart sounds: Normal heart  sounds.  Pulmonary:     Effort: Pulmonary effort is normal.     Breath sounds: Normal breath sounds.  Musculoskeletal:        General: Normal range of motion.       Feet:  Feet:     Right foot:     Skin integrity: Dry skin present.     Toenail Condition: Right toenails are abnormally thick.     Left foot:     Skin integrity: Callus and dry skin present.     Toenail Condition: Left toenails are abnormally thick.     Comments: Callus present on left foot, pt complained of tenderness at site during palpation.  Skin:    General: Skin is warm and dry.     Capillary Refill: Capillary refill takes less than 2 seconds.  Neurological:      General: No focal deficit present.     Mental Status: He is alert and oriented to person, place, and time. Mental status is at baseline.  Psychiatric:        Mood and Affect: Mood normal.        Behavior: Behavior normal.        Thought Content: Thought content normal.        Judgment: Judgment normal.      No results found for any visits on 08/01/23.  Last CBC Lab Results  Component Value Date   WBC 12.0 (H) 07/17/2023   HGB 8.9 (L) 07/17/2023   HCT 26.7 (L) 07/17/2023   MCV 92.4 07/17/2023   MCH 30.8 07/17/2023   RDW 13.2 07/17/2023   PLT 413 (H) 07/17/2023   Last metabolic panel Lab Results  Component Value Date   GLUCOSE 118 (H) 07/17/2023   NA 133 (L) 07/17/2023   K 4.2 07/17/2023   CL 101 07/17/2023   CO2 19 (L) 07/17/2023   BUN 49 (H) 07/17/2023   CREATININE 4.63 (H) 07/17/2023   GFRNONAA 14 (L) 07/17/2023   CALCIUM 9.6 07/17/2023   PHOS 4.8 (H) 07/10/2023   PROT 9.1 (H) 07/15/2023   ALBUMIN 3.6 07/15/2023   LABGLOB 4.6 (H) 07/06/2023   AGRATIO 0.7 06/30/2023   BILITOT 0.8 07/15/2023   ALKPHOS 131 (H) 07/15/2023   AST 27 07/15/2023   ALT 26 07/15/2023   ANIONGAP 13 07/17/2023   Last lipids Lab Results  Component Value Date   CHOL 127 06/13/2023   HDL 39 (L) 06/13/2023   LDLCALC 68 06/13/2023   TRIG 108 06/13/2023   CHOLHDL 3.3 06/13/2023   Last hemoglobin A1c Lab Results  Component Value Date   HGBA1C 5.5 03/07/2023   Last thyroid functions Lab Results  Component Value Date   TSH 1.400 02/02/2021   Last vitamin D Lab Results  Component Value Date   VD25OH 23.22 (L) 07/04/2023   Last vitamin B12 and Folate Lab Results  Component Value Date   VITAMINB12 1,313 (H) 07/04/2023   FOLATE 16.2 07/03/2023      The ASCVD Risk score (Arnett DK, et al., 2019) failed to calculate for the following reasons:   The patient has a prior MI or stroke diagnosis    Assessment & Plan:    1. Essential hypertension -His blood pressure is  under control. His Coreg was decreased from 25 mg to 12.5 mg BID. He will continue to follow on a DASH diet and exercises as tolerated.  He was encouraged to quit smoking. He was advised to notify clinic for dizziness, and to change position slowly. -  carvedilol (COREG) 12.5 MG tablet; Take 1 tablet (12.5 mg total) by mouth 2 (two) times daily with a meal.  Dispense: 60 tablet; Refill: 0  2. Chronic kidney disease, stage 3a (HCC) -His renal function panel has improved. Nephrologist requested that his right sided dialysis permanent catheter has been removed on 10/81/2024. He has a follow up appointment with nephrology on 08/26/2023. He was advised to increase water intake.   3. Intermittent claudication (HCC) -He reports numbness and tingling in bilateral lower extremities. He was advised to take gabapentin as prescribed, and will follow up with Vascular surgery. - Ambulatory referral to Vascular Surgery  4. Callus of foot -Callus on left foot. Monofilament test with less sensation on left foot. Bilateral thick toe nails bilaterally. He was advised to be sure that he is not submerging his feet in extreme hot water and to always wear shoes on feet.  - Ambulatory referral to Podiatry  5. Other polyneuropathy -Bilateral lower extremity pain 8/10. Patient will take gabapentin at night as prescribed. He was advised to be sure that he follows up with the vascular surgeon. He was educated on medication side effects and advised to notify clinic and go to the ED. - gabapentin (NEURONTIN) 100 MG capsule; Take 1 capsule (100 mg total) by mouth at bedtime.  Dispense: 30 capsule; Refill: 0 .    Follow up in th clinic on or around 09/03/2023, or if symptoms worsen or fail to improve.    Nilsa Nutting, FNP student

## 2023-08-01 NOTE — Patient Instructions (Signed)
Gastroesophageal Reflux Disease, Adult  Gastroesophageal reflux (GER) happens when acid from the stomach flows up into the tube that connects the mouth and the stomach (esophagus). Normally, food travels down the esophagus and stays in the stomach to be digested. With GER, food and stomach acid sometimes move back up into the esophagus. You may have a disease called gastroesophageal reflux disease (GERD) if the reflux: Happens often. Causes frequent or very bad symptoms. Causes problems such as damage to the esophagus. When this happens, the esophagus becomes sore and swollen. Over time, GERD can make small holes (ulcers) in the lining of the esophagus. What are the causes? This condition is caused by a problem with the muscle between the esophagus and the stomach. When this muscle is weak or not normal, it does not close properly to keep food and acid from coming back up from the stomach. The muscle can be weak because of: Tobacco use. Pregnancy. Having a certain type of hernia (hiatal hernia). Alcohol use. Certain foods and drinks, such as coffee, chocolate, onions, and peppermint. What increases the risk? Being overweight. Having a disease that affects your connective tissue. Taking NSAIDs, such a ibuprofen. What are the signs or symptoms? Heartburn. Difficult or painful swallowing. The feeling of having a lump in the throat. A bitter taste in the mouth. Bad breath. Having a lot of saliva. Having an upset or bloated stomach. Burping. Chest pain. Different conditions can cause chest pain. Make sure you see your doctor if you have chest pain. Shortness of breath or wheezing. A long-term cough or a cough at night. Wearing away of the surface of teeth (tooth enamel). Weight loss. How is this treated? Making changes to your diet. Taking medicine. Having surgery. Treatment will depend on how bad your symptoms are. Follow these instructions at home: Eating and drinking  Follow a  diet as told by your doctor. You may need to avoid foods and drinks such as: Coffee and tea, with or without caffeine. Drinks that contain alcohol. Energy drinks and sports drinks. Bubbly (carbonated) drinks or sodas. Chocolate and cocoa. Peppermint and mint flavorings. Garlic and onions. Horseradish. Spicy and acidic foods. These include peppers, chili powder, curry powder, vinegar, hot sauces, and BBQ sauce. Citrus fruit juices and citrus fruits, such as oranges, lemons, and limes. Tomato-based foods. These include red sauce, chili, salsa, and pizza with red sauce. Fried and fatty foods. These include donuts, french fries, potato chips, and high-fat dressings. High-fat meats. These include hot dogs, rib eye steak, sausage, ham, and bacon. High-fat dairy items, such as whole milk, butter, and cream cheese. Eat small meals often. Avoid eating large meals. Avoid drinking large amounts of liquid with your meals. Avoid eating meals during the 2-3 hours before bedtime. Avoid lying down right after you eat. Do not exercise right after you eat. Lifestyle  Do not smoke or use any products that contain nicotine or tobacco. If you need help quitting, ask your doctor. Try to lower your stress. If you need help doing this, ask your doctor. If you are overweight, lose an amount of weight that is healthy for you. Ask your doctor about a safe weight loss goal. General instructions Pay attention to any changes in your symptoms. Take over-the-counter and prescription medicines only as told by your doctor. Do not take aspirin, ibuprofen, or other NSAIDs unless your doctor says it is okay. Wear loose clothes. Do not wear anything tight around your waist. Raise (elevate) the head of your bed about  6 inches (15 cm). You may need to use a wedge to do this. Avoid bending over if this makes your symptoms worse. Keep all follow-up visits. Contact a doctor if: You have new symptoms. You lose weight and you  do not know why. You have trouble swallowing or it hurts to swallow. You have wheezing or a cough that keeps happening. You have a hoarse voice. Your symptoms do not get better with treatment. Get help right away if: You have sudden pain in your arms, neck, jaw, teeth, or back. You suddenly feel sweaty, dizzy, or light-headed. You have chest pain or shortness of breath. You vomit and the vomit is green, yellow, or black, or it looks like blood or coffee grounds. You faint. Your poop (stool) is red, bloody, or black. You cannot swallow, drink, or eat. These symptoms may represent a serious problem that is an emergency. Do not wait to see if the symptoms will go away. Get medical help right away. Call your local emergency services (911 in the [place] Do not drive yourself to the hospital. Summary If a person has gastroesophageal reflux disease (GERD), food and stomach acid move back up into the esophagus and cause symptoms or problems such as damage to the esophagus. Treatment will depend on how bad your symptoms are. Follow a diet as told by your doctor. Take all medicines only as told by your doctor. This information is not intended to replace advice given to you by your health care provider. Make sure you discuss any questions you have with your health care provider. Document Revised: 04/04/2020 Document Reviewed: 04/04/2020 Elsevier Patient Education  2024 [employer] Hypertension, Adult High blood pressure (hypertension) is when the force of blood pumping through the arteries is too strong. The arteries are the blood vessels that carry blood from the heart throughout the body. Hypertension forces the heart to work harder to pump blood and may cause arteries to become narrow or stiff. Untreated or uncontrolled hypertension can lead to a heart attack, heart failure, a stroke, kidney disease, and other problems. A blood pressure reading consists of a higher number over a lower number.  Ideally, your blood pressure should be below 120/80. The first ("top") number is called the systolic pressure. It is a measure of the pressure in your arteries as your heart beats. The second ("bottom") number is called the diastolic pressure. It is a measure of the pressure in your arteries as the heart relaxes. What are the causes? The exact cause of this condition is not known. There are some conditions that result in high blood pressure. What increases the risk? Certain factors may make you more likely to develop high blood pressure. Some of these risk factors are under your control, including: Smoking. Not getting enough exercise or physical activity. Being overweight. Having too much fat, sugar, calories, or salt (sodium) in your diet. Drinking too much alcohol. Other risk factors include: Having a personal history of heart disease, diabetes, high cholesterol, or kidney disease. Stress. Having a family history of high blood pressure and high cholesterol. Having obstructive sleep apnea. Age. The risk increases with age. What are the signs or symptoms? High blood pressure may not cause symptoms. Very high blood pressure (hypertensive crisis) may cause: Headache. Fast or irregular heartbeats (palpitations). Shortness of breath. Nosebleed. Nausea and vomiting. Vision changes. Severe chest pain, dizziness, and seizures. How is this diagnosed? This condition is diagnosed by measuring your blood pressure while you are seated, with your arm resting  on a flat surface, your legs uncrossed, and your feet flat on the floor. The cuff of the blood pressure monitor will be placed directly against the skin of your upper arm at the level of your heart. Blood pressure should be measured at least twice using the same arm. Certain conditions can cause a difference in blood pressure between your right and left arms. If you have a high blood pressure reading during one visit or you have normal blood  pressure with other risk factors, you may be asked to: Return on a different day to have your blood pressure checked again. Monitor your blood pressure at home for 1 week or longer. If you are diagnosed with hypertension, you may have other blood or imaging tests to help your health care provider understand your overall risk for other conditions. How is this treated? This condition is treated by making healthy lifestyle changes, such as eating healthy foods, exercising more, and reducing your alcohol intake. You may be referred for counseling on a healthy diet and physical activity. Your health care provider may prescribe medicine if lifestyle changes are not enough to get your blood pressure under control and if: Your systolic blood pressure is above 130. Your diastolic blood pressure is above 80. Your personal target blood pressure may vary depending on your medical conditions, your age, and other factors. Follow these instructions at home: Eating and drinking  Eat a diet that is high in fiber and potassium, and low in sodium, added sugar, and fat. An example of this eating plan is called the DASH diet. DASH stands for Dietary Approaches to Stop Hypertension. To eat this way: Eat plenty of fresh fruits and vegetables. Try to fill one half of your plate at each meal with fruits and vegetables. Eat whole grains, such as whole-wheat pasta, brown rice, or whole-grain bread. Fill about one fourth of your plate with whole grains. Eat or drink low-fat dairy products, such as skim milk or low-fat yogurt. Avoid fatty cuts of meat, processed or cured meats, and poultry with skin. Fill about one fourth of your plate with lean proteins, such as fish, chicken without skin, beans, eggs, or tofu. Avoid pre-made and processed foods. These tend to be higher in sodium, added sugar, and fat. Reduce your daily sodium intake. Many people with hypertension should eat less than 1,500 mg of sodium a day. Do not drink  alcohol if: Your health care provider tells you not to drink. You are pregnant, may be pregnant, or are planning to become pregnant. If you drink alcohol: Limit how much you have to: 0-1 drink a day for women. 0-2 drinks a day for men. Know how much alcohol is in your drink. In the [place] one drink equals one 12 oz bottle of beer (355 mL), one 5 oz glass of wine (148 mL), or one 1 oz glass of hard liquor (44 mL). Lifestyle  Work with your health care provider to maintain a healthy body weight or to lose weight. Ask what an ideal weight is for you. Get at least 30 minutes of exercise that causes your heart to beat faster (aerobic exercise) most days of the week. Activities may include walking, swimming, or biking. Include exercise to strengthen your muscles (resistance exercise), such as Pilates or lifting weights, as part of your weekly exercise routine. Try to do these types of exercises for 30 minutes at least 3 days a week. Do not use any products that contain nicotine or tobacco. These products  include cigarettes, chewing tobacco, and vaping devices, such as e-cigarettes. If you need help quitting, ask your health care provider. Monitor your blood pressure at home as told by your health care provider. Keep all follow-up visits. This is important. Medicines Take over-the-counter and prescription medicines only as told by your health care provider. Follow directions carefully. Blood pressure medicines must be taken as prescribed. Do not skip doses of blood pressure medicine. Doing this puts you at risk for problems and can make the medicine less effective. Ask your health care provider about side effects or reactions to medicines that you should watch for. Contact a health care provider if you: Think you are having a reaction to a medicine you are taking. Have headaches that keep coming back (recurring). Feel dizzy. Have swelling in your ankles. Have trouble with your vision. Get help  right away if you: Develop a severe headache or confusion. Have unusual weakness or numbness. Feel faint. Have severe pain in your chest or abdomen. Vomit repeatedly. Have trouble breathing. These symptoms may be an emergency. Get help right away. Call 911. Do not wait to see if the symptoms will go away. Do not drive yourself to the hospital. Summary Hypertension is when the force of blood pumping through your arteries is too strong. If this condition is not controlled, it may put you at risk for serious complications. Your personal target blood pressure may vary depending on your medical conditions, your age, and other factors. For most people, a normal blood pressure is less than 120/80. Hypertension is treated with lifestyle changes, medicines, or a combination of both. Lifestyle changes include losing weight, eating a healthy, low-sodium diet, exercising more, and limiting alcohol. This information is not intended to replace advice given to you by your health care provider. Make sure you discuss any questions you have with your health care provider. Document Revised: 08/01/2021 Document Reviewed: 08/01/2021 Elsevier Patient Education  2024 [employer] Inc. Seizure, Adult A seizure is a sudden burst of abnormal activity in the brain. Seizures usually last from 30 seconds to 2 minutes. There are many types of seizures. And they can cause many different symptoms. What are the causes? Common causes of a seizure include: Fever or infection. Problems that affect the brain. These may include: A brain or head injury. A stroke. A brain tumor. Low levels of blood sugar or salt. Kidney problems or liver problems. Some inherited conditions. These are passed down from parent to child. Problems with a substance, such as: Having a reaction to a drug or a medicine. Stopping the use of a substance all of a sudden. When this causes problems, it's called withdrawal. Disorders that affect how you  develop, such as autism spectrum disorder or cerebral palsy. Sometimes, the cause may not be known. Some people who have a seizure never have another one. A person who has repeated seizures over time without a clear cause has a condition called epilepsy. What increases the risk? Having a family history of epilepsy. Having had a tonic-clonic seizure before. This type of seizure causes: The muscles of the whole body to tighten, or contract. Loss of consciousness. Having a head injury or a stroke in the past. Having had too little oxygen at birth. What are the signs or symptoms? The symptoms vary depending on the type of seizure you have. Symptoms during a seizure Having convulsions. This means shaking with fast, jerky movements of muscles. Stiffness of the body. Breathing problems. Being confused. Staring or not responding to  sound or touch. Head nodding, eye blinking, eye twitching, or fast eye movements. Drooling, grunting, or making clicking sounds with your mouth. Losing control of when you pee or poop. Symptoms before a seizure Feeling afraid, worried, or nervous. Feeling like you may vomit. Vertigo. This feels like: You are moving when you're not. Things around you are moving when they're not. Dj vu. This is a feeling of having seen or heard something before. Odd tastes or smells. Changes in how you see. You may see flashing lights or spots. Symptoms after a seizure Being confused. Feeling sleepy. Headache. Sore muscles. How is this diagnosed? A seizure may be diagnosed based on: A description of your symptoms. Video of your seizures can be helpful. Your medical history. A physical exam. Tests, such as: Blood tests. CT scan. MRI. Electroencephalogram, or EEG. This test measures electrical activity in the brain. A test of your spinal fluid. This is called a spinal tap or lumbar puncture. How is this treated? If your seizure stops on its own, you will not need  treatment. If your seizure lasts longer than 5 minutes, you'll normally need treatment. This may include: Medicines given through an IV. Avoiding things, such as medicines, that are known to cause your seizures. Medicines to prevent seizures. These are called antiepileptics. A device to prevent or control seizures. Eating foods that are low in carbohydrates and high in fat (ketogenic diet). Surgery. This is sometimes needed if you keep having seizures. Follow these instructions at home: Medicines Take your medicines only as told by your health care provider. Avoid anything that may keep your medicine from working, such as alcohol. Activity Follow your provider's advice about driving, swimming, and doing other things that would be dangerous if you had a seizure. Wait until your provider says it's safe for you to do these things. If you live in the [place] ask your local department of motor vehicles [facility]) when you can drive. Get enough rest and sleep. Not getting enough sleep can make seizures more likely to happen. Teaching others  Teach friends and family what to do if you have a seizure. Tell them to: Help you get down to the ground safely. Protect your head and body. Loosen any clothing around your neck. Turn you on your side. This helps keep your airway clear if you vomit. Know whether or not you need emergency care. Stay with you until you are better. Also, tell them what not to do if you have a seizure. Tell them: They should not hold you down. They should not put anything in your mouth. General instructions Avoid anything that has caused you to have seizures. Keep a seizure diary. Write down: What you remember about each seizure. What you think might have caused each seizure. Keep all follow-up visits. Your provider may need to monitor your progress. Contact a health care provider if: You have another seizure or seizures. Call each time you have a seizure. You have a change in  how often or when you have seizures. You keep having seizures with treatment. You have symptoms of being sick or having an infection. You are not able to take your medicine. Get help right away if: You have or someone has seen you have: A seizure that lasts longer than 5 minutes. Many seizures in a row and you don't feel better between seizures. A seizure that makes it harder to breathe. A seizure that leaves you unable to speak or use a part of your body. You didn't  wake up right away after a seizure. You injure yourself during a seizure. You have confusion or pain right after a seizure. These symptoms may be an emergency. Call 911 right away. Do not wait to see if the symptoms will go away. Do not drive yourself to the hospital. This information is not intended to replace advice given to you by your health care provider. Make sure you discuss any questions you have with your health care provider. Document Revised: 11/07/2022 Document Reviewed: 11/07/2022 Elsevier Patient Education  2024 [employer].

## 2023-08-12 ENCOUNTER — Other Ambulatory Visit: Payer: Self-pay | Admitting: Gerontology

## 2023-08-12 ENCOUNTER — Other Ambulatory Visit: Payer: Self-pay

## 2023-08-12 DIAGNOSIS — F1010 Alcohol abuse, uncomplicated: Secondary | ICD-10-CM

## 2023-08-13 ENCOUNTER — Other Ambulatory Visit: Payer: Self-pay

## 2023-08-13 MED ORDER — VITAMIN B-1 100 MG PO TABS
100.0000 mg | ORAL_TABLET | Freq: Every day | ORAL | 1 refills | Status: DC
  Filled 2023-08-13: qty 90, 90d supply, fill #0
  Filled 2023-11-28: qty 90, 90d supply, fill #1

## 2023-08-13 MED ORDER — FOLIC ACID 1 MG PO TABS
1.0000 mg | ORAL_TABLET | Freq: Every day | ORAL | 1 refills | Status: DC
  Filled 2023-08-13 (×2): qty 90, 90d supply, fill #0
  Filled 2023-11-28: qty 90, 90d supply, fill #1

## 2023-08-22 ENCOUNTER — Other Ambulatory Visit: Payer: Self-pay

## 2023-08-26 ENCOUNTER — Other Ambulatory Visit: Payer: Self-pay

## 2023-08-26 MED ORDER — AMLODIPINE BESYLATE 5 MG PO TABS
5.0000 mg | ORAL_TABLET | ORAL | 11 refills | Status: DC
  Filled 2023-08-26: qty 30, 30d supply, fill #0

## 2023-08-27 ENCOUNTER — Ambulatory Visit: Payer: Medicaid Other | Admitting: Podiatry

## 2023-08-27 ENCOUNTER — Encounter: Payer: Self-pay | Admitting: Podiatry

## 2023-08-27 DIAGNOSIS — D2371 Other benign neoplasm of skin of right lower limb, including hip: Secondary | ICD-10-CM | POA: Diagnosis not present

## 2023-08-27 DIAGNOSIS — D2372 Other benign neoplasm of skin of left lower limb, including hip: Secondary | ICD-10-CM

## 2023-08-27 DIAGNOSIS — D492 Neoplasm of unspecified behavior of bone, soft tissue, and skin: Secondary | ICD-10-CM

## 2023-08-27 NOTE — Progress Notes (Signed)
   Chief Complaint  Patient presents with   Foot Pain    "On my right foot, I have a bump right here and a bump right here.  It's unbearable." N - painful bumps L - 1st met, arch left D - 15 - 20 years O - gradually worse C - sharp pain, tender, sore A - walking, standing, barefoot T - biofreeze, Aleve spray, use a scraper to file it down.    Subjective: 52 y.o. male presenting to the office today for evaluation of pain and tenderness associated to the plantar aspect of the left foot secondary to skin lesions.  Present for several years.  He has pain and tenderness especially when working throughout the day when he is on his feet.   Past Medical History:  Diagnosis Date   Alcohol use    Carotid artery occlusion    GERD (gastroesophageal reflux disease)    Hypercholesteremia    Hypertension    Renal disorder    Seizures (HCC)    Stroke (HCC)    TIA 12/29/20, re-admitted 01/14/21 for sequelae   Tobacco abuse     Past Surgical History:  Procedure Laterality Date   DIALYSIS/PERMA CATHETER INSERTION N/A 07/02/2023   Procedure: DIALYSIS/PERMA CATHETER INSERTION;  Surgeon: Annice Needy, MD;  Location: [facility];  Service: Cardiovascular;  Laterality: N/A;   DIALYSIS/PERMA CATHETER REMOVAL Right 07/26/2023   Procedure: DIALYSIS/PERMA CATHETER REMOVAL;  Surgeon: Renford Dills, MD;  Location: [facility];  Service: Cardiovascular;  Laterality: Right;   NO PAST SURGERIES      No Known Allergies   Objective:  Physical Exam General: Alert and oriented x3 in no acute distress  Dermatology: Hyperkeratotic lesion(s) present on the plantar aspect of the left foot x 2. Pain on palpation with a central nucleated core noted. Skin is warm, dry and supple bilateral lower extremities. Negative for open lesions or macerations.  Vascular: Palpable pedal pulses bilaterally. No edema or erythema noted. Capillary refill within normal limits.  Neurological: Grossly intact  via light touch  Musculoskeletal Exam: Pain on palpation at the keratotic lesion(s) noted. Range of motion within normal limits bilateral. Muscle strength 5/5 in all groups bilateral.  Assessment: 1.  Symptomatic benign skin lesion   Plan of Care:  -Patient evaluated -Excisional debridement of keratoic lesion(s) using a chisel blade was performed without incident.  -Salicylic acid applied with a bandaid -Recommend OTC salicylic acid daily -Return to the clinic 4 weeks  *Maintenance at [employer], DPM [facility]  Dr. Felecia Shelling, DPM    [address] [place], [place] [address]                Office [phone]  Fax [phone]

## 2023-08-28 ENCOUNTER — Other Ambulatory Visit: Payer: Self-pay

## 2023-08-29 ENCOUNTER — Other Ambulatory Visit: Payer: Self-pay

## 2023-09-10 ENCOUNTER — Other Ambulatory Visit: Payer: Self-pay | Admitting: Gerontology

## 2023-09-10 ENCOUNTER — Other Ambulatory Visit: Payer: Self-pay

## 2023-09-10 DIAGNOSIS — I10 Essential (primary) hypertension: Secondary | ICD-10-CM

## 2023-09-10 DIAGNOSIS — G6289 Other specified polyneuropathies: Secondary | ICD-10-CM

## 2023-09-11 ENCOUNTER — Other Ambulatory Visit: Payer: Self-pay

## 2023-09-12 ENCOUNTER — Other Ambulatory Visit: Payer: Self-pay

## 2023-09-12 ENCOUNTER — Encounter: Payer: Self-pay | Admitting: Gerontology

## 2023-09-12 ENCOUNTER — Ambulatory Visit: Payer: Medicaid Other | Admitting: Gerontology

## 2023-09-12 VITALS — BP 145/81 | HR 67 | Ht 71.0 in | Wt 213.3 lb

## 2023-09-12 DIAGNOSIS — R7303 Prediabetes: Secondary | ICD-10-CM

## 2023-09-12 DIAGNOSIS — I10 Essential (primary) hypertension: Secondary | ICD-10-CM

## 2023-09-12 LAB — POCT GLYCOSYLATED HEMOGLOBIN (HGB A1C): Hemoglobin A1C: 5.1 % (ref 4.0–5.6)

## 2023-09-12 LAB — GLUCOSE, POCT (MANUAL RESULT ENTRY): POC Glucose: 109 mg/dL — AB (ref 70–99)

## 2023-09-12 MED ORDER — CARVEDILOL 12.5 MG PO TABS
12.5000 mg | ORAL_TABLET | Freq: Two times a day (BID) | ORAL | 1 refills | Status: DC
  Filled 2023-09-12: qty 60, 30d supply, fill #0
  Filled 2024-02-07: qty 60, 30d supply, fill #1

## 2023-09-12 MED FILL — Gabapentin Cap 100 MG: ORAL | 30 days supply | Qty: 30 | Fill #0 | Status: AC

## 2023-09-12 MED FILL — Losartan Potassium Tab 100 MG: ORAL | 90 days supply | Qty: 90 | Fill #0 | Status: AC

## 2023-09-12 NOTE — Progress Notes (Signed)
Established Patient Office Visit  Subjective   Patient ID: Gary [name], male    DOB: 30-Sep-1971  Age: 52 y.o. MRN: [id number]  No chief complaint on file.   HPI  Gary [name] is a 52 y/o male with a past medical history of HTN, hyperlipidemia, seizure disorder, CVA, TIA, alcohol use, tobacco use, GERD and acute kidney injury who presents for follow up. He was seen at [facility] on 08/26/23 by  Dr Thedore Mins, was restarted on Amlodipine 5 mg. His renal function checked on 08/26/23, Serum creatinine was 1.68 mg/dl and eGFR was 49. He was also seen by the Podiatrist Dr Logan Bores B on 08/27/23 for foot pain and callous,Excisional debridement of keratoic lesion(s) using a chisel blade was performed without incident.  -Salicylic acid applied with a bandaid -Recommend OTC salicylic acid daily -Return to the clinic 4 weeks, Currently, he state states that the pain to his left foot has improved 80%. His HgbA1c was 5.1% and blood glucose 106 mg/dl. He states that he drinks 2 mix drinks daily and will continue to work on cutting back. He denies any seizure activities since his last visit. Overall, he states that he's doing well and offers no further complaint.  Review of Systems  Constitutional: Negative.   Eyes: Negative.   Respiratory: Negative.    Cardiovascular: Negative.   Neurological: Negative.   Psychiatric/Behavioral: Negative.        Objective:     There were no vitals taken for this visit. BP Readings from Last 3 Encounters:  08/01/23 105/69  07/26/23 115/63  07/17/23 120/83   Wt Readings from Last 3 Encounters:  08/01/23 202 lb 4.8 oz (91.8 kg)  07/26/23 190 lb 14.7 oz (86.6 kg)  07/17/23 191 lb (86.6 kg)      Physical Exam HENT:     Head: Normocephalic and atraumatic.     Mouth/Throat:     Mouth: Mucous membranes are moist.  Eyes:     Pupils: Pupils are equal, round, and reactive to light.  Cardiovascular:     Rate and Rhythm: Normal rate and regular  rhythm.     Pulses: Normal pulses.     Heart sounds: Normal heart sounds.  Pulmonary:     Effort: Pulmonary effort is normal.     Breath sounds: Normal breath sounds.  Skin:    General: Skin is warm.  Neurological:     General: No focal deficit present.     Mental Status: He is alert and oriented to person, place, and time.  Psychiatric:        Mood and Affect: Mood normal.        Behavior: Behavior normal.        Thought Content: Thought content normal.        Judgment: Judgment normal.      No results found for any visits on 09/12/23.  Last CBC Lab Results  Component Value Date   WBC 12.0 (H) 07/17/2023   HGB 8.9 (L) 07/17/2023   HCT 26.7 (L) 07/17/2023   MCV 92.4 07/17/2023   MCH 30.8 07/17/2023   RDW 13.2 07/17/2023   PLT 413 (H) 07/17/2023   Last metabolic panel Lab Results  Component Value Date   GLUCOSE 118 (H) 07/17/2023   NA 133 (L) 07/17/2023   K 4.2 07/17/2023   CL 101 07/17/2023   CO2 19 (L) 07/17/2023   BUN 49 (H) 07/17/2023   CREATININE 4.63 (H) 07/17/2023   GFRNONAA 14 (L) 07/17/2023  CALCIUM 9.6 07/17/2023   PHOS 4.8 (H) 07/10/2023   PROT 9.1 (H) 07/15/2023   ALBUMIN 3.6 07/15/2023   LABGLOB 4.6 (H) 07/06/2023   AGRATIO 0.7 06/30/2023   BILITOT 0.8 07/15/2023   ALKPHOS 131 (H) 07/15/2023   AST 27 07/15/2023   ALT 26 07/15/2023   ANIONGAP 13 07/17/2023   Last lipids Lab Results  Component Value Date   CHOL 127 06/13/2023   HDL 39 (L) 06/13/2023   LDLCALC 68 06/13/2023   TRIG 108 06/13/2023   CHOLHDL 3.3 06/13/2023   Last hemoglobin A1c Lab Results  Component Value Date   HGBA1C 5.5 03/07/2023   Last thyroid functions Lab Results  Component Value Date   TSH 1.400 02/02/2021   Last vitamin D Lab Results  Component Value Date   VD25OH 23.22 (L) 07/04/2023   Last vitamin B12 and Folate Lab Results  Component Value Date   VITAMINB12 1,313 (H) 07/04/2023   FOLATE 16.2 07/03/2023      The ASCVD Risk score (Arnett DK, et  al., 2019) failed to calculate for the following reasons:   The patient has a prior MI or stroke diagnosis    Assessment & Plan:   1. Prediabetes - His HgbA1c is normal at 5.1%, was encouraged to continue on low carb/non concentrated sweet diet and exercise as tolerated. - POCT HgB A1C - POCT Glucose (CBG)  2. Essential hypertension - His blood pressure is improving, encouraged to continue on current medication, DASH diet and exercise as tolerated. He was advised on smoking cessation and alcohol abstinence. His kidney function is improving, was encouraged to increase water intake and follow up with his appointments. - carvedilol (COREG) 12.5 MG tablet; Take 1 tablet (12.5 mg total) by mouth 2 (two) times daily with a meal.  Dispense: 60 tablet; Refill: 1   No follow-ups on file.  Patient has no follow up appointment because he has active Medicaid. [facility] wishes him well with his care.   Tanaya Dunigan Trellis Paganini, NP

## 2023-09-12 NOTE — Patient Instructions (Signed)

## 2023-09-13 ENCOUNTER — Other Ambulatory Visit: Payer: Self-pay

## 2023-09-24 ENCOUNTER — Ambulatory Visit: Payer: Medicaid Other | Admitting: Podiatry

## 2023-09-24 ENCOUNTER — Encounter: Payer: Self-pay | Admitting: Podiatry

## 2023-09-24 DIAGNOSIS — D2372 Other benign neoplasm of skin of left lower limb, including hip: Secondary | ICD-10-CM

## 2023-09-24 DIAGNOSIS — D492 Neoplasm of unspecified behavior of bone, soft tissue, and skin: Secondary | ICD-10-CM

## 2023-09-24 NOTE — Progress Notes (Signed)
Chief Complaint  Patient presents with   Callouses    "They're starting to hurt again."    Subjective: 52 y.o. 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  Present for several years.  He has noticed some improvement over the past month.  He applied a freezing agent that he purchased from the store for about a week or 2.  Past Medical History:  Diagnosis Date   Alcohol use    Carotid artery occlusion    GERD (gastroesophageal reflux disease)    Hypercholesteremia    Hypertension    Renal disorder    Seizures (HCC)    Stroke (HCC)    TIA 12/29/20, re-admitted 01/14/21 for sequelae   Tobacco abuse     Past Surgical History:  Procedure Laterality Date   DIALYSIS/PERMA CATHETER INSERTION N/A 07/02/2023   Procedure: DIALYSIS/PERMA CATHETER INSERTION;  Surgeon: Annice Needy, MD;  Location: [facility] INVASIVE CV LAB;  Service: Cardiovascular;  Laterality: N/A;   DIALYSIS/PERMA CATHETER REMOVAL Right 07/26/2023   Procedure: DIALYSIS/PERMA CATHETER REMOVAL;  Surgeon: Renford Dills, MD;  Location: [facility] INVASIVE CV LAB;  Service: Cardiovascular;  Laterality: Right;   NO PAST SURGERIES      No Known Allergies   Objective:  Physical Exam General: Alert and oriented x3 in no acute distress  Dermatology: There continues to be hyperkeratotic lesion(s) present on the plantar aspect of the left foot x 2. Pain on palpation with a central nucleated core noted. Skin is warm, dry and supple bilateral lower extremities. Negative for open lesions or macerations.  Vascular: No history of PVD.  Smoker.  1/3 ppd x 20+ yrs [facility]AS [place] ABI WITH/WO TBI 07/13/2021 ABI Findings:  +---------+------------------+-----+----------+--------+  Right   Rt Pressure (mmHg)IndexWaveform  Comment   +---------+------------------+-----+----------+--------+  Brachial 170                                         +---------+------------------+-----+----------+--------+  PTA     149               0.88 biphasic            +---------+------------------+-----+----------+--------+  DP      133               0.78 monophasic          +---------+------------------+-----+----------+--------+  Great Toe113               0.66 Abnormal            +---------+------------------+-----+----------+--------+   +---------+------------------+-----+----------+-------+  Left    Lt Pressure (mmHg)IndexWaveform  Comment  +---------+------------------+-----+----------+-------+  Brachial 170                                       +---------+------------------+-----+----------+-------+  PTA     131               0.77 biphasic           +---------+------------------+-----+----------+-------+  DP      116               0.68 monophasic         +---------+------------------+-----+----------+-------+  Philis Fendt  0.58 Abnormal           +---------+------------------+-----+----------+-------+   +-------+-----------+-----------+------------+------------+  ABI/TBIToday's ABIToday's TBIPrevious ABIPrevious TBI  +-------+-----------+-----------+------------+------------+  Right 0.88       0.66       0.60        0.42          +-------+-----------+-----------+------------+------------+  Left  0.77       0.58       0.57        0.35          +-------+-----------+-----------+------------+------------+  Bilateral ABIs and TBIs appear increased.    Summary:  Right: Resting right ankle-brachial index indicates mild right lower extremity arterial disease. The right toe-brachial index is abnormal.   Left: Resting left ankle-brachial index indicates moderate left lower extremity arterial disease. The left toe-brachial index is abnormal.   Neurological: Grossly intact via light touch  Musculoskeletal Exam: Pain on palpation at the keratotic lesion(s) noted.  Range of motion within normal limits bilateral. Muscle strength 5/5 in all groups bilateral.  Assessment: 1.  Symptomatic benign skin lesion x 2 left foot   Plan of Care:  -Patient evaluated - Again today excisional debridement of keratoic lesion(s) using a chisel blade was performed without incident.  -Salicylic acid applied with a bandaid -Recommend OTC salicylic acid daily.  Provided. -Patient states that he has follow-up appointment with [facility] in [place]. Appt scheduled for 10/10/23 -Return to the clinic 4 weeks  *Maintenance at [employer] Valinda Party, DPM [facility]  Dr. Felecia Shelling, DPM    [address] [place], [place] [address]                Office [phone]  Fax [phone]

## 2023-10-07 ENCOUNTER — Other Ambulatory Visit: Payer: Self-pay

## 2023-10-07 ENCOUNTER — Other Ambulatory Visit (INDEPENDENT_AMBULATORY_CARE_PROVIDER_SITE_OTHER): Payer: Self-pay | Admitting: Nurse Practitioner

## 2023-10-07 DIAGNOSIS — I739 Peripheral vascular disease, unspecified: Secondary | ICD-10-CM

## 2023-10-09 HISTORY — PX: INTRAVASCULAR STENT INCLUDING PTA AND IMAGING: SHX6671

## 2023-10-10 ENCOUNTER — Ambulatory Visit (INDEPENDENT_AMBULATORY_CARE_PROVIDER_SITE_OTHER): Payer: Medicaid Other

## 2023-10-10 ENCOUNTER — Encounter (INDEPENDENT_AMBULATORY_CARE_PROVIDER_SITE_OTHER): Payer: Self-pay | Admitting: Nurse Practitioner

## 2023-10-10 ENCOUNTER — Ambulatory Visit (INDEPENDENT_AMBULATORY_CARE_PROVIDER_SITE_OTHER): Payer: Medicaid Other | Admitting: Nurse Practitioner

## 2023-10-10 VITALS — BP 178/89 | HR 73 | Resp 16 | Ht 71.0 in | Wt 211.0 lb

## 2023-10-10 DIAGNOSIS — N179 Acute kidney failure, unspecified: Secondary | ICD-10-CM

## 2023-10-10 DIAGNOSIS — I739 Peripheral vascular disease, unspecified: Secondary | ICD-10-CM

## 2023-10-10 DIAGNOSIS — I70223 Atherosclerosis of native arteries of extremities with rest pain, bilateral legs: Secondary | ICD-10-CM

## 2023-10-10 DIAGNOSIS — I10 Essential (primary) hypertension: Secondary | ICD-10-CM | POA: Diagnosis not present

## 2023-10-11 ENCOUNTER — Encounter (INDEPENDENT_AMBULATORY_CARE_PROVIDER_SITE_OTHER): Payer: Self-pay | Admitting: Nurse Practitioner

## 2023-10-11 NOTE — H&P (View-Only) (Signed)
Subjective:    Patient ID: Gary [name], male    DOB: 1971-07-22, 53 y.o.   MRN: [id number] Chief Complaint  Patient presents with   New Patient (Initial Visit)    Ref iloabachie consult intermittent claudication    Gary [name] is a 53 year old male who presents today for evaluation of his bilateral lower extremities.  He was diagnosed with peripheral arterial disease approximately 2 years ago.  At that time he did not have significant claudication but recently he notes that the claudication symptoms have worsened.  He notes that he is not able to walk far at all before he begins to have significant pain in his bilateral lower extremities.  He also notes that at night he begins to have burning and stinging and pain that wakes him up from his sleep as well.  He notes that this is intensified over the last few months to a point where it is now beginning to interfere with his activities of daily living.  Currently has no open wounds or ulcerations.  Today the patient has ABI of 0.78 on the right and 0.72 on the left.  About 2 years ago he previously had an ABI of 0.88 on the right and 0.77 on the left.  On an ultrasound 2 years ago it was noted that he had bilateral SFA occlusions at that time and he has not since had any intervention done to his lower extremities.    Review of Systems  Cardiovascular:  Negative for leg swelling.  Skin:  Negative for wound.  All other systems reviewed and are negative.      Objective:   Physical Exam Vitals reviewed.  HENT:     Head: Normocephalic.  Cardiovascular:     Rate and Rhythm: Normal rate.     Pulses:          Dorsalis pedis pulses are detected w/ Doppler on the right side and detected w/ Doppler on the left side.       Posterior tibial pulses are detected w/ Doppler on the right side and detected w/ Doppler on the left side.  Pulmonary:     Effort: Pulmonary effort is normal.  Skin:    General: Skin is warm and dry.  Neurological:      Mental Status: He is alert and oriented to person, place, and time.  Psychiatric:        Mood and Affect: Mood normal.        Behavior: Behavior normal.        Thought Content: Thought content normal.        Judgment: Judgment normal.     BP (!) 178/89   Pulse 73   Resp 16   Ht 5\' 11"  (1.803 m)   Wt 211 lb (95.7 kg)   BMI 29.43 kg/m   Past Medical History:  Diagnosis Date   Alcohol use    Carotid artery occlusion    GERD (gastroesophageal reflux disease)    Hypercholesteremia    Hypertension    Renal disorder    Seizures (HCC)    Stroke (HCC)    TIA 12/29/20, re-admitted 01/14/21 for sequelae   Tobacco abuse     Social History   Socioeconomic History   Marital status: Divorced    Spouse name: Not on file   Number of children: 4   Years of education: Not on file   Highest education level: High school graduate  Occupational History   Not on file  Tobacco Use  Smoking status: Every Day    Current packs/day: 0.33    Average packs/day: 0.3 packs/day for 34.0 years (11.2 ttl pk-yrs)    Types: Cigarettes   Smokeless tobacco: Never   Tobacco comments:    Pt reports he has thoughts of quitting, has cut back to 5 cigarettes per day  Vaping Use   Vaping status: Never Used  Substance and Sexual Activity   Alcohol use: Yes    Alcohol/week: 21.0 standard drinks of alcohol    Types: 21 Standard drinks or equivalent per week    Comment: 3-4 mixed drinks daily, 03/07/23 patient states down to 2.5 drinks per day   Drug use: No   Sexual activity: Yes    Birth control/protection: None  Other Topics Concern   Not on file  Social History Narrative   Lives at home with his aunt   Right handed   Drinks no caffeine   Social Drivers of Corporate investment banker Strain: Low Risk  (06/20/2023)   Overall Financial Resource Strain (CARDIA)    Difficulty of Paying Living Expenses: Not hard at all  Food Insecurity: No Food Insecurity (06/28/2023)   Hunger Vital Sign    Worried  About Running Out of Food in the Last Year: Never true    Ran Out of Food in the Last Year: Never true  Transportation Needs: No Transportation Needs (06/28/2023)   PRAPARE - Administrator, Civil Service (Medical): No    Lack of Transportation (Non-Medical): No  Physical Activity: Sufficiently Active (06/20/2023)   Exercise Vital Sign    Days of Exercise per Week: 5 days    Minutes of Exercise per Session: 60 min  Stress: No Stress Concern Present (06/20/2023)   [employer] of Occupational Health - Occupational Stress Questionnaire    Feeling of Stress : Not at all  Social Connections: Unknown (06/20/2023)   Social Connection and Isolation Panel [NHANES]    Frequency of Communication with Friends and Family: More than three times a week    Frequency of Social Gatherings with Friends and Family: Twice a week    Attends Religious Services: Not on Insurance claims handler of Clubs or Organizations: No    Attends Banker Meetings: Never    Marital Status: Separated  Intimate Partner Violence: Not At Risk (06/28/2023)   Humiliation, Afraid, Rape, and Kick questionnaire    Fear of Current or Ex-Partner: No    Emotionally Abused: No    Physically Abused: No    Sexually Abused: No    Past Surgical History:  Procedure Laterality Date   DIALYSIS/PERMA CATHETER INSERTION N/A 07/02/2023   Procedure: DIALYSIS/PERMA CATHETER INSERTION;  Surgeon: Annice Needy, MD;  Location: [facility] INVASIVE CV LAB;  Service: Cardiovascular;  Laterality: N/A;   DIALYSIS/PERMA CATHETER REMOVAL Right 07/26/2023   Procedure: DIALYSIS/PERMA CATHETER REMOVAL;  Surgeon: Renford Dills, MD;  Location: [facility] INVASIVE CV LAB;  Service: Cardiovascular;  Laterality: Right;   NO PAST SURGERIES      Family History  Problem Relation Age of Onset   Heart attack Mother    Hypertension Mother        pt reports mother has stent   Other Father        unknown medical history   Healthy Sister    Other  Maternal Grandmother        unknown medical history   Other Maternal Grandfather 10       choked on  a watermelon seed   Other Paternal Grandmother        unknown medical history   Other Paternal Grandfather        unknown medical history    No Known Allergies     Latest Ref Rng & Units 07/17/2023    8:26 AM 07/15/2023    9:00 AM 07/12/2023    8:24 AM  CBC  WBC 4.0 - 10.5 K/uL 12.0  14.1  17.1   Hemoglobin 13.0 - 17.0 g/dL 8.9  8.8  8.4   Hematocrit 39.0 - 52.0 % 26.7  27.4  25.6   Platelets 150 - 400 K/uL 413  388  440       CMP     Component Value Date/Time   NA 133 (L) 07/17/2023 0826   NA 142 06/13/2023 1750   K 4.2 07/17/2023 0826   CL 101 07/17/2023 0826   CO2 19 (L) 07/17/2023 0826   GLUCOSE 118 (H) 07/17/2023 0826   BUN 49 (H) 07/17/2023 0826   BUN 21 06/13/2023 1750   CREATININE 4.63 (H) 07/17/2023 0826   CALCIUM 9.6 07/17/2023 0826   PROT 9.1 (H) 07/15/2023 0900   PROT 7.5 06/13/2023 1750   ALBUMIN 3.6 07/15/2023 0900   ALBUMIN 4.4 06/13/2023 1750   AST 27 07/15/2023 0900   ALT 26 07/15/2023 0900   ALKPHOS 131 (H) 07/15/2023 0900   BILITOT 0.8 07/15/2023 0900   BILITOT 0.4 06/13/2023 1750   EGFR 51 (L) 06/13/2023 1750   GFRNONAA 14 (L) 07/17/2023 0826     No results found.     Assessment & Plan:   1. 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  This represents a limb threatening ischemia and places the patient at the risk for  limb loss.  Patient should undergo angiography of the right followed by left lower extremity with the hope for intervention for limb salvage.  The risks and benefits as well as the alternative therapies was discussed in detail with the patient.  All questions were answered.   Patient agrees to proceed with  lower extremity angiography.  The patient will follow up with me in the office after the procedure.      2. AKI (acute kidney injury) (HCC) The patient previously had an acute kidney injury.  His kidney function has recovered and he is no longer utilizing dialysis but due to this we will try to be as dye sparing as possible.  3. Primary hypertension Continue antihypertensive medications as already ordered, these medications have been reviewed and there are no changes at this time.   Current Outpatient Medications on File Prior to Visit  Medication Sig Dispense Refill   amLODipine (NORVASC) 5 MG tablet Take 1 tablet (5 mg total) by mouth once daily. 30 tablet 11   aspirin 81 MG tablet Take 1 tablet (81 mg total) by mouth daily. 30 tablet 11   blood glucose meter kit and supplies KIT Use as directed to check blood glucose 2-3 times per week and as needed 1 each 0   carvedilol (COREG) 12.5 MG tablet Take 1 tablet (12.5 mg total) by mouth 2 (two) times daily with a meal. 60 tablet 1   clopidogrel (PLAVIX) 75 MG tablet Take 1 tablet (75 mg total) by mouth  daily. 30 tablet 11   folic acid (FOLVITE) 1 MG tablet Take 1 tablet (1 mg total) by mouth daily. 90 tablet 1   gabapentin (NEURONTIN) 100 MG capsule Take 1 capsule (100 mg total) by mouth at bedtime. 30 capsule 0   glucose blood (RIGHTEST GS550 BLOOD GLUCOSE) test strip Use as directed to check blood glucose 2-3 times per week and as needed 100 each 0   levETIRAcetam (KEPPRA) 500 MG tablet Take 1 tablet (500 mg total) by mouth 2 (two) times daily. 180 tablet 3   levETIRAcetam (KEPPRA) 500 MG tablet Take 1 tablet (500 mg total) by mouth 3 (three) times a week. After hemodialysis 12 tablet 11   losartan (COZAAR) 100 MG tablet Take 1 tablet (100 mg total) by mouth in the morning. 90 tablet 1   pantoprazole (PROTONIX) 40 MG tablet Take 1 tablet (40 mg total) by mouth daily. 30 tablet 2   Rightest GL300 Lancets MISC  Use as directed to check blood glucose 2-3 times per week and as needed 100 each 0   rosuvastatin (CRESTOR) 10 MG tablet Take 1 tablet (10 mg total) by mouth daily. 30 tablet 11   sildenafil (VIAGRA) 100 MG tablet Take 1 tablet 1 hour prior to intercourse. 30 tablet 0   thiamine (VITAMIN B-1) 100 MG tablet Take 1 tablet (100 mg total) by mouth daily. 90 tablet 1   No current facility-administered medications on file prior to visit.    There are no Patient Instructions on file for this visit. No follow-ups on file.   Georgiana Spinner, NP

## 2023-10-11 NOTE — H&P (View-Only) (Signed)
Subjective:    Patient ID: Gary [name], male    DOB: 1971-02-18, 53 y.o.   MRN: [id number] Chief Complaint  Patient presents with   New Patient (Initial Visit)    Ref iloabachie consult intermittent claudication    Gary [name] is a 53 year old male who presents today for evaluation of his bilateral lower extremities.  He was diagnosed with peripheral arterial disease approximately 2 years ago.  At that time he did not have significant claudication but recently he notes that the claudication symptoms have worsened.  He notes that he is not able to walk far at all before he begins to have significant pain in his bilateral lower extremities.  He also notes that at night he begins to have burning and stinging and pain that wakes him up from his sleep as well.  He notes that this is intensified over the last few months to a point where it is now beginning to interfere with his activities of daily living.  Currently has no open wounds or ulcerations.  Today the patient has ABI of 0.78 on the right and 0.72 on the left.  About 2 years ago he previously had an ABI of 0.88 on the right and 0.77 on the left.  On an ultrasound 2 years ago it was noted that he had bilateral SFA occlusions at that time and he has not since had any intervention done to his lower extremities.    Review of Systems  Cardiovascular:  Negative for leg swelling.  Skin:  Negative for wound.  All other systems reviewed and are negative.      Objective:   Physical Exam Vitals reviewed.  HENT:     Head: Normocephalic.  Cardiovascular:     Rate and Rhythm: Normal rate.     Pulses:          Dorsalis pedis pulses are detected w/ Doppler on the right side and detected w/ Doppler on the left side.       Posterior tibial pulses are detected w/ Doppler on the right side and detected w/ Doppler on the left side.  Pulmonary:     Effort: Pulmonary effort is normal.  Skin:    General: Skin is warm and dry.  Neurological:      Mental Status: He is alert and oriented to person, place, and time.  Psychiatric:        Mood and Affect: Mood normal.        Behavior: Behavior normal.        Thought Content: Thought content normal.        Judgment: Judgment normal.     BP (!) 178/89   Pulse 73   Resp 16   Ht 5\' 11"  (1.803 m)   Wt 211 lb (95.7 kg)   BMI 29.43 kg/m   Past Medical History:  Diagnosis Date   Alcohol use    Carotid artery occlusion    GERD (gastroesophageal reflux disease)    Hypercholesteremia    Hypertension    Renal disorder    Seizures (HCC)    Stroke (HCC)    TIA 12/29/20, re-admitted 01/14/21 for sequelae   Tobacco abuse     Social History   Socioeconomic History   Marital status: Divorced    Spouse name: Not on file   Number of children: 4   Years of education: Not on file   Highest education level: High school graduate  Occupational History   Not on file  Tobacco Use  Smoking status: Every Day    Current packs/day: 0.33    Average packs/day: 0.3 packs/day for 34.0 years (11.2 ttl pk-yrs)    Types: Cigarettes   Smokeless tobacco: Never   Tobacco comments:    Pt reports he has thoughts of quitting, has cut back to 5 cigarettes per day  Vaping Use   Vaping status: Never Used  Substance and Sexual Activity   Alcohol use: Yes    Alcohol/week: 21.0 standard drinks of alcohol    Types: 21 Standard drinks or equivalent per week    Comment: 3-4 mixed drinks daily, 03/07/23 patient states down to 2.5 drinks per day   Drug use: No   Sexual activity: Yes    Birth control/protection: None  Other Topics Concern   Not on file  Social History Narrative   Lives at home with his aunt   Right handed   Drinks no caffeine   Social Drivers of Corporate investment banker Strain: Low Risk  (06/20/2023)   Overall Financial Resource Strain (CARDIA)    Difficulty of Paying Living Expenses: Not hard at all  Food Insecurity: No Food Insecurity (06/28/2023)   Hunger Vital Sign    Worried  About Running Out of Food in the Last Year: Never true    Ran Out of Food in the Last Year: Never true  Transportation Needs: No Transportation Needs (06/28/2023)   PRAPARE - Administrator, Civil Service (Medical): No    Lack of Transportation (Non-Medical): No  Physical Activity: Sufficiently Active (06/20/2023)   Exercise Vital Sign    Days of Exercise per Week: 5 days    Minutes of Exercise per Session: 60 min  Stress: No Stress Concern Present (06/20/2023)   [employer] of Occupational Health - Occupational Stress Questionnaire    Feeling of Stress : Not at all  Social Connections: Unknown (06/20/2023)   Social Connection and Isolation Panel [NHANES]    Frequency of Communication with Friends and Family: More than three times a week    Frequency of Social Gatherings with Friends and Family: Twice a week    Attends Religious Services: Not on Insurance claims handler of Clubs or Organizations: No    Attends Banker Meetings: Never    Marital Status: Separated  Intimate Partner Violence: Not At Risk (06/28/2023)   Humiliation, Afraid, Rape, and Kick questionnaire    Fear of Current or Ex-Partner: No    Emotionally Abused: No    Physically Abused: No    Sexually Abused: No    Past Surgical History:  Procedure Laterality Date   DIALYSIS/PERMA CATHETER INSERTION N/A 07/02/2023   Procedure: DIALYSIS/PERMA CATHETER INSERTION;  Surgeon: Annice Needy, MD;  Location: [facility];  Service: Cardiovascular;  Laterality: N/A;   DIALYSIS/PERMA CATHETER REMOVAL Right 07/26/2023   Procedure: DIALYSIS/PERMA CATHETER REMOVAL;  Surgeon: Renford Dills, MD;  Location: [facility];  Service: Cardiovascular;  Laterality: Right;   NO PAST SURGERIES      Family History  Problem Relation Age of Onset   Heart attack Mother    Hypertension Mother        pt reports mother has stent   Other Father        unknown medical history   Healthy Sister    Other  Maternal Grandmother        unknown medical history   Other Maternal Grandfather 63       choked on  a watermelon seed   Other Paternal Grandmother        unknown medical history   Other Paternal Grandfather        unknown medical history    No Known Allergies     Latest Ref Rng & Units 07/17/2023    8:26 AM 07/15/2023    9:00 AM 07/12/2023    8:24 AM  CBC  WBC 4.0 - 10.5 K/uL 12.0  14.1  17.1   Hemoglobin 13.0 - 17.0 g/dL 8.9  8.8  8.4   Hematocrit 39.0 - 52.0 % 26.7  27.4  25.6   Platelets 150 - 400 K/uL 413  388  440       CMP     Component Value Date/Time   NA 133 (L) 07/17/2023 0826   NA 142 06/13/2023 1750   K 4.2 07/17/2023 0826   CL 101 07/17/2023 0826   CO2 19 (L) 07/17/2023 0826   GLUCOSE 118 (H) 07/17/2023 0826   BUN 49 (H) 07/17/2023 0826   BUN 21 06/13/2023 1750   CREATININE 4.63 (H) 07/17/2023 0826   CALCIUM 9.6 07/17/2023 0826   PROT 9.1 (H) 07/15/2023 0900   PROT 7.5 06/13/2023 1750   ALBUMIN 3.6 07/15/2023 0900   ALBUMIN 4.4 06/13/2023 1750   AST 27 07/15/2023 0900   ALT 26 07/15/2023 0900   ALKPHOS 131 (H) 07/15/2023 0900   BILITOT 0.8 07/15/2023 0900   BILITOT 0.4 06/13/2023 1750   EGFR 51 (L) 06/13/2023 1750   GFRNONAA 14 (L) 07/17/2023 0826     No results found.     Assessment & Plan:   1. 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  This represents a limb threatening ischemia and places the patient at the risk for  limb loss.  Patient should undergo angiography of the right followed by left lower extremity with the hope for intervention for limb salvage.  The risks and benefits as well as the alternative therapies was discussed in detail with the patient.  All questions were answered.   Patient agrees to proceed with  lower extremity angiography.  The patient will follow up with me in the office after the procedure.      2. AKI (acute kidney injury) (HCC) The patient previously had an acute kidney injury.  His kidney function has recovered and he is no longer utilizing dialysis but due to this we will try to be as dye sparing as possible.  3. Primary hypertension Continue antihypertensive medications as already ordered, these medications have been reviewed and there are no changes at this time.   Current Outpatient Medications on File Prior to Visit  Medication Sig Dispense Refill   amLODipine (NORVASC) 5 MG tablet Take 1 tablet (5 mg total) by mouth once daily. 30 tablet 11   aspirin 81 MG tablet Take 1 tablet (81 mg total) by mouth daily. 30 tablet 11   blood glucose meter kit and supplies KIT Use as directed to check blood glucose 2-3 times per week and as needed 1 each 0   carvedilol (COREG) 12.5 MG tablet Take 1 tablet (12.5 mg total) by mouth 2 (two) times daily with a meal. 60 tablet 1   clopidogrel (PLAVIX) 75 MG tablet Take 1 tablet (75 mg total) by mouth  daily. 30 tablet 11   folic acid (FOLVITE) 1 MG tablet Take 1 tablet (1 mg total) by mouth daily. 90 tablet 1   gabapentin (NEURONTIN) 100 MG capsule Take 1 capsule (100 mg total) by mouth at bedtime. 30 capsule 0   glucose blood (RIGHTEST GS550 BLOOD GLUCOSE) test strip Use as directed to check blood glucose 2-3 times per week and as needed 100 each 0   levETIRAcetam (KEPPRA) 500 MG tablet Take 1 tablet (500 mg total) by mouth 2 (two) times daily. 180 tablet 3   levETIRAcetam (KEPPRA) 500 MG tablet Take 1 tablet (500 mg total) by mouth 3 (three) times a week. After hemodialysis 12 tablet 11   losartan (COZAAR) 100 MG tablet Take 1 tablet (100 mg total) by mouth in the morning. 90 tablet 1   pantoprazole (PROTONIX) 40 MG tablet Take 1 tablet (40 mg total) by mouth daily. 30 tablet 2   Rightest GL300 Lancets MISC  Use as directed to check blood glucose 2-3 times per week and as needed 100 each 0   rosuvastatin (CRESTOR) 10 MG tablet Take 1 tablet (10 mg total) by mouth daily. 30 tablet 11   sildenafil (VIAGRA) 100 MG tablet Take 1 tablet 1 hour prior to intercourse. 30 tablet 0   thiamine (VITAMIN B-1) 100 MG tablet Take 1 tablet (100 mg total) by mouth daily. 90 tablet 1   No current facility-administered medications on file prior to visit.    There are no Patient Instructions on file for this visit. No follow-ups on file.   Georgiana Spinner, NP

## 2023-10-11 NOTE — Progress Notes (Signed)
 Subjective:    Patient ID: Gary [name], male    DOB: 1971-07-22, 53 y.o.   MRN: [id number] Chief Complaint  Patient presents with   New Patient (Initial Visit)    Ref iloabachie consult intermittent claudication    Gary [name] is a 53 year old male who presents today for evaluation of his bilateral lower extremities.  He was diagnosed with peripheral arterial disease approximately 2 years ago.  At that time he did not have significant claudication but recently he notes that the claudication symptoms have worsened.  He notes that he is not able to walk far at all before he begins to have significant pain in his bilateral lower extremities.  He also notes that at night he begins to have burning and stinging and pain that wakes him up from his sleep as well.  He notes that this is intensified over the last few months to a point where it is now beginning to interfere with his activities of daily living.  Currently has no open wounds or ulcerations.  Today the patient has ABI of 0.78 on the right and 0.72 on the left.  About 2 years ago he previously had an ABI of 0.88 on the right and 0.77 on the left.  On an ultrasound 2 years ago it was noted that he had bilateral SFA occlusions at that time and he has not since had any intervention done to his lower extremities.    Review of Systems  Cardiovascular:  Negative for leg swelling.  Skin:  Negative for wound.  All other systems reviewed and are negative.      Objective:   Physical Exam Vitals reviewed.  HENT:     Head: Normocephalic.  Cardiovascular:     Rate and Rhythm: Normal rate.     Pulses:          Dorsalis pedis pulses are detected w/ Doppler on the right side and detected w/ Doppler on the left side.       Posterior tibial pulses are detected w/ Doppler on the right side and detected w/ Doppler on the left side.  Pulmonary:     Effort: Pulmonary effort is normal.  Skin:    General: Skin is warm and dry.  Neurological:      Mental Status: He is alert and oriented to person, place, and time.  Psychiatric:        Mood and Affect: Mood normal.        Behavior: Behavior normal.        Thought Content: Thought content normal.        Judgment: Judgment normal.     BP (!) 178/89   Pulse 73   Resp 16   Ht 5\' 11"  (1.803 m)   Wt 211 lb (95.7 kg)   BMI 29.43 kg/m   Past Medical History:  Diagnosis Date   Alcohol use    Carotid artery occlusion    GERD (gastroesophageal reflux disease)    Hypercholesteremia    Hypertension    Renal disorder    Seizures (HCC)    Stroke (HCC)    TIA 12/29/20, re-admitted 01/14/21 for sequelae   Tobacco abuse     Social History   Socioeconomic History   Marital status: Divorced    Spouse name: Not on file   Number of children: 4   Years of education: Not on file   Highest education level: High school graduate  Occupational History   Not on file  Tobacco Use  Smoking status: Every Day    Current packs/day: 0.33    Average packs/day: 0.3 packs/day for 34.0 years (11.2 ttl pk-yrs)    Types: Cigarettes   Smokeless tobacco: Never   Tobacco comments:    Pt reports he has thoughts of quitting, has cut back to 5 cigarettes per day  Vaping Use   Vaping status: Never Used  Substance and Sexual Activity   Alcohol use: Yes    Alcohol/week: 21.0 standard drinks of alcohol    Types: 21 Standard drinks or equivalent per week    Comment: 3-4 mixed drinks daily, 03/07/23 patient states down to 2.5 drinks per day   Drug use: No   Sexual activity: Yes    Birth control/protection: None  Other Topics Concern   Not on file  Social History Narrative   Lives at home with his aunt   Right handed   Drinks no caffeine   Social Drivers of Corporate investment banker Strain: Low Risk  (06/20/2023)   Overall Financial Resource Strain (CARDIA)    Difficulty of Paying Living Expenses: Not hard at all  Food Insecurity: No Food Insecurity (06/28/2023)   Hunger Vital Sign    Worried  About Running Out of Food in the Last Year: Never true    Ran Out of Food in the Last Year: Never true  Transportation Needs: No Transportation Needs (06/28/2023)   PRAPARE - Administrator, Civil Service (Medical): No    Lack of Transportation (Non-Medical): No  Physical Activity: Sufficiently Active (06/20/2023)   Exercise Vital Sign    Days of Exercise per Week: 5 days    Minutes of Exercise per Session: 60 min  Stress: No Stress Concern Present (06/20/2023)   [employer] of Occupational Health - Occupational Stress Questionnaire    Feeling of Stress : Not at all  Social Connections: Unknown (06/20/2023)   Social Connection and Isolation Panel [NHANES]    Frequency of Communication with Friends and Family: More than three times a week    Frequency of Social Gatherings with Friends and Family: Twice a week    Attends Religious Services: Not on Insurance claims handler of Clubs or Organizations: No    Attends Banker Meetings: Never    Marital Status: Separated  Intimate Partner Violence: Not At Risk (06/28/2023)   Humiliation, Afraid, Rape, and Kick questionnaire    Fear of Current or Ex-Partner: No    Emotionally Abused: No    Physically Abused: No    Sexually Abused: No    Past Surgical History:  Procedure Laterality Date   DIALYSIS/PERMA CATHETER INSERTION N/A 07/02/2023   Procedure: DIALYSIS/PERMA CATHETER INSERTION;  Surgeon: Annice Needy, MD;  Location: [facility];  Service: Cardiovascular;  Laterality: N/A;   DIALYSIS/PERMA CATHETER REMOVAL Right 07/26/2023   Procedure: DIALYSIS/PERMA CATHETER REMOVAL;  Surgeon: Renford Dills, MD;  Location: [facility];  Service: Cardiovascular;  Laterality: Right;   NO PAST SURGERIES      Family History  Problem Relation Age of Onset   Heart attack Mother    Hypertension Mother        pt reports mother has stent   Other Father        unknown medical history   Healthy Sister    Other  Maternal Grandmother        unknown medical history   Other Maternal Grandfather 10       choked on  a watermelon seed   Other Paternal Grandmother        unknown medical history   Other Paternal Grandfather        unknown medical history    No Known Allergies     Latest Ref Rng & Units 07/17/2023    8:26 AM 07/15/2023    9:00 AM 07/12/2023    8:24 AM  CBC  WBC 4.0 - 10.5 K/uL 12.0  14.1  17.1   Hemoglobin 13.0 - 17.0 g/dL 8.9  8.8  8.4   Hematocrit 39.0 - 52.0 % 26.7  27.4  25.6   Platelets 150 - 400 K/uL 413  388  440       CMP     Component Value Date/Time   NA 133 (L) 07/17/2023 0826   NA 142 06/13/2023 1750   K 4.2 07/17/2023 0826   CL 101 07/17/2023 0826   CO2 19 (L) 07/17/2023 0826   GLUCOSE 118 (H) 07/17/2023 0826   BUN 49 (H) 07/17/2023 0826   BUN 21 06/13/2023 1750   CREATININE 4.63 (H) 07/17/2023 0826   CALCIUM 9.6 07/17/2023 0826   PROT 9.1 (H) 07/15/2023 0900   PROT 7.5 06/13/2023 1750   ALBUMIN 3.6 07/15/2023 0900   ALBUMIN 4.4 06/13/2023 1750   AST 27 07/15/2023 0900   ALT 26 07/15/2023 0900   ALKPHOS 131 (H) 07/15/2023 0900   BILITOT 0.8 07/15/2023 0900   BILITOT 0.4 06/13/2023 1750   EGFR 51 (L) 06/13/2023 1750   GFRNONAA 14 (L) 07/17/2023 0826     No results found.     Assessment & Plan:   1. 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  This represents a limb threatening ischemia and places the patient at the risk for  limb loss.  Patient should undergo angiography of the right followed by left lower extremity with the hope for intervention for limb salvage.  The risks and benefits as well as the alternative therapies was discussed in detail with the patient.  All questions were answered.   Patient agrees to proceed with  lower extremity angiography.  The patient will follow up with me in the office after the procedure.      2. AKI (acute kidney injury) (HCC) The patient previously had an acute kidney injury.  His kidney function has recovered and he is no longer utilizing dialysis but due to this we will try to be as dye sparing as possible.  3. Primary hypertension Continue antihypertensive medications as already ordered, these medications have been reviewed and there are no changes at this time.   Current Outpatient Medications on File Prior to Visit  Medication Sig Dispense Refill   amLODipine (NORVASC) 5 MG tablet Take 1 tablet (5 mg total) by mouth once daily. 30 tablet 11   aspirin 81 MG tablet Take 1 tablet (81 mg total) by mouth daily. 30 tablet 11   blood glucose meter kit and supplies KIT Use as directed to check blood glucose 2-3 times per week and as needed 1 each 0   carvedilol (COREG) 12.5 MG tablet Take 1 tablet (12.5 mg total) by mouth 2 (two) times daily with a meal. 60 tablet 1   clopidogrel (PLAVIX) 75 MG tablet Take 1 tablet (75 mg total) by mouth  daily. 30 tablet 11   folic acid (FOLVITE) 1 MG tablet Take 1 tablet (1 mg total) by mouth daily. 90 tablet 1   gabapentin (NEURONTIN) 100 MG capsule Take 1 capsule (100 mg total) by mouth at bedtime. 30 capsule 0   glucose blood (RIGHTEST GS550 BLOOD GLUCOSE) test strip Use as directed to check blood glucose 2-3 times per week and as needed 100 each 0   levETIRAcetam (KEPPRA) 500 MG tablet Take 1 tablet (500 mg total) by mouth 2 (two) times daily. 180 tablet 3   levETIRAcetam (KEPPRA) 500 MG tablet Take 1 tablet (500 mg total) by mouth 3 (three) times a week. After hemodialysis 12 tablet 11   losartan (COZAAR) 100 MG tablet Take 1 tablet (100 mg total) by mouth in the morning. 90 tablet 1   pantoprazole (PROTONIX) 40 MG tablet Take 1 tablet (40 mg total) by mouth daily. 30 tablet 2   Rightest GL300 Lancets MISC  Use as directed to check blood glucose 2-3 times per week and as needed 100 each 0   rosuvastatin (CRESTOR) 10 MG tablet Take 1 tablet (10 mg total) by mouth daily. 30 tablet 11   sildenafil (VIAGRA) 100 MG tablet Take 1 tablet 1 hour prior to intercourse. 30 tablet 0   thiamine (VITAMIN B-1) 100 MG tablet Take 1 tablet (100 mg total) by mouth daily. 90 tablet 1   No current facility-administered medications on file prior to visit.    There are no Patient Instructions on file for this visit. No follow-ups on file.   Georgiana Spinner, NP

## 2023-10-15 ENCOUNTER — Telehealth (INDEPENDENT_AMBULATORY_CARE_PROVIDER_SITE_OTHER): Payer: Self-pay

## 2023-10-15 LAB — VAS US ABI WITH/WO TBI
Left ABI: 0.72
Right ABI: 0.78

## 2023-10-15 NOTE — Telephone Encounter (Signed)
 Spoke with the patient and he is scheduled with Dr. Wyn Quaker for a RLE and LLE angio at the [facility] on 10/24/23 (12:00 pm) and 10/31/23 (12:00 pm). Pre-procedure instructions were discussed and will be sent to Mychart and mailed.

## 2023-10-22 ENCOUNTER — Encounter: Payer: Self-pay | Admitting: Podiatry

## 2023-10-22 ENCOUNTER — Ambulatory Visit (INDEPENDENT_AMBULATORY_CARE_PROVIDER_SITE_OTHER): Payer: Medicaid Other | Admitting: Podiatry

## 2023-10-22 VITALS — Ht 71.0 in | Wt 211.0 lb

## 2023-10-22 DIAGNOSIS — D492 Neoplasm of unspecified behavior of bone, soft tissue, and skin: Secondary | ICD-10-CM

## 2023-10-22 DIAGNOSIS — D2372 Other benign neoplasm of skin of left lower limb, including hip: Secondary | ICD-10-CM | POA: Diagnosis not present

## 2023-10-22 DIAGNOSIS — I739 Peripheral vascular disease, unspecified: Secondary | ICD-10-CM

## 2023-10-22 NOTE — Progress Notes (Signed)
 No chief complaint on file.   Subjective: 53 y.o. 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  Present for several years.  He has noticed some improvement over the past month.  He applied a freezing agent that he purchased from the store for about a week or 2.  Past Medical History:  Diagnosis Date   Alcohol use    Carotid artery occlusion    GERD (gastroesophageal reflux disease)    Hypercholesteremia    Hypertension    Renal disorder    Seizures (HCC)    Stroke (HCC)    TIA 12/29/20, re-admitted 01/14/21 for sequelae   Tobacco abuse     Past Surgical History:  Procedure Laterality Date   DIALYSIS/PERMA CATHETER INSERTION N/A 07/02/2023   Procedure: DIALYSIS/PERMA CATHETER INSERTION;  Surgeon: Marea Selinda RAMAN, MD;  Location: [facility];  Service: Cardiovascular;  Laterality: N/A;   DIALYSIS/PERMA CATHETER REMOVAL Right 07/26/2023   Procedure: DIALYSIS/PERMA CATHETER REMOVAL;  Surgeon: Jama Cordella MATSU, MD;  Location: [facility];  Service: Cardiovascular;  Laterality: Right;   NO PAST SURGERIES      No Known Allergies   Objective:  Physical Exam General: Alert and oriented x3 in no acute distress  Dermatology: There continues to be hyperkeratotic lesion(s) present on the plantar aspect of the left foot x 2. Pain on palpation with a central nucleated core noted. Skin is warm, dry and supple bilateral lower extremities. Negative for open lesions or macerations.  Vascular: No history of PVD.  Smoker.  1/3 ppd x 20+ yrs VAS US  ABI WITH/WO TBI 07/13/2021 ABI Findings:  +---------+------------------+-----+----------+--------+  Right   Rt Pressure (mmHg)IndexWaveform  Comment   +---------+------------------+-----+----------+--------+  Brachial 170                                        +---------+------------------+-----+----------+--------+  PTA     149                0.88 biphasic            +---------+------------------+-----+----------+--------+  DP      133               0.78 monophasic          +---------+------------------+-----+----------+--------+  Great Toe113               0.66 Abnormal            +---------+------------------+-----+----------+--------+   +---------+------------------+-----+----------+-------+  Left    Lt Pressure (mmHg)IndexWaveform  Comment  +---------+------------------+-----+----------+-------+  Brachial 170                                       +---------+------------------+-----+----------+-------+  PTA     131               0.77 biphasic           +---------+------------------+-----+----------+-------+  DP      116               0.68 monophasic         +---------+------------------+-----+----------+-------+  Great Toe98                0.58 Abnormal           +---------+------------------+-----+----------+-------+   +-------+-----------+-----------+------------+------------+  ABI/TBIToday's ABIToday's TBIPrevious ABIPrevious TBI  +-------+-----------+-----------+------------+------------+  Right 0.88       0.66       0.60        0.42          +-------+-----------+-----------+------------+------------+  Left  0.77       0.58       0.57        0.35          +-------+-----------+-----------+------------+------------+  Bilateral ABIs and TBIs appear increased.    Summary:  Right: Resting right ankle-brachial index indicates mild right lower extremity arterial disease. The right toe-brachial index is abnormal.   Left: Resting left ankle-brachial index indicates moderate left lower extremity arterial disease. The left toe-brachial index is abnormal.   Neurological: Grossly intact via light touch  Musculoskeletal Exam: Pain on palpation at the keratotic lesion(s) noted. Range of motion within normal limits bilateral. Muscle strength 5/5 in all  groups bilateral.  Assessment: 1.  Symptomatic benign skin lesion x 2 left foot   Plan of Care:  -Patient evaluated - Again today excisional debridement of keratoic lesion(s) using a chisel blade was performed without incident.  -Salicylic acid applied with a bandaid - Continue OTC salicylic acid daily.  Provided. - Scheduled for lower extremity angiography w/ [facility], Dr. Marea 10/24/2023  (RLE) and 10/31/2023 (LLE). -Return to the clinic 4 weeks  *Maintenance at [employer] Thresa EMERSON Janit, DPM [facility]  Dr. Thresa EMERSON Janit, DPM    [address] [place], [place] [address]                Office [phone]  Fax [phone]

## 2023-10-23 ENCOUNTER — Telehealth (INDEPENDENT_AMBULATORY_CARE_PROVIDER_SITE_OTHER): Payer: Self-pay

## 2023-10-23 NOTE — Telephone Encounter (Signed)
 Spoke with the patient to reschedule his LLE angio with Dr. Vonna Guardian from 10/31/23 to 11/07/23 with a 12:00 pm arrival time to the [facility]. Pre-procedure instructions will be sent to Mychart and mailed. Patient was offered 10/28/23 and 11/04/23 and patient declined.

## 2023-10-24 ENCOUNTER — Ambulatory Visit
Admission: RE | Admit: 2023-10-24 | Discharge: 2023-10-24 | Disposition: A | Discharge status: Home / Self Care | Payer: Medicaid Other | Attending: Vascular Surgery | Admitting: Vascular Surgery

## 2023-10-24 ENCOUNTER — Encounter
Admission: RE | Disposition: A | Discharge status: Home / Self Care | Payer: Self-pay | Source: Home / Self Care | Attending: Vascular Surgery

## 2023-10-24 DIAGNOSIS — Z8249 Family history of ischemic heart disease and other diseases of the circulatory system: Secondary | ICD-10-CM | POA: Insufficient documentation

## 2023-10-24 DIAGNOSIS — I70229 Atherosclerosis of native arteries of extremities with rest pain, unspecified extremity: Secondary | ICD-10-CM

## 2023-10-24 DIAGNOSIS — I10 Essential (primary) hypertension: Secondary | ICD-10-CM | POA: Diagnosis not present

## 2023-10-24 DIAGNOSIS — I743 Embolism and thrombosis of arteries of the lower extremities: Secondary | ICD-10-CM

## 2023-10-24 DIAGNOSIS — N179 Acute kidney failure, unspecified: Secondary | ICD-10-CM | POA: Insufficient documentation

## 2023-10-24 DIAGNOSIS — I77819 Aortic ectasia, unspecified site: Secondary | ICD-10-CM | POA: Diagnosis not present

## 2023-10-24 DIAGNOSIS — F17210 Nicotine dependence, cigarettes, uncomplicated: Secondary | ICD-10-CM | POA: Insufficient documentation

## 2023-10-24 DIAGNOSIS — I70223 Atherosclerosis of native arteries of extremities with rest pain, bilateral legs: Secondary | ICD-10-CM | POA: Insufficient documentation

## 2023-10-24 HISTORY — PX: LOWER EXTREMITY ANGIOGRAPHY: CATH118251

## 2023-10-24 LAB — CREATININE, SERUM
Creatinine, Ser: 1.55 mg/dL — ABNORMAL HIGH (ref 0.61–1.24)
GFR, Estimated: 54 mL/min — ABNORMAL LOW (ref >60)

## 2023-10-24 LAB — BUN: BUN: 18 mg/dL (ref 6–20)

## 2023-10-24 SURGERY — LOWER EXTREMITY ANGIOGRAPHY
Anesthesia: Moderate Sedation | Site: Leg Lower | Laterality: Right

## 2023-10-24 MED ORDER — LIDOCAINE-EPINEPHRINE (PF) 1 %-1:200000 IJ SOLN
INTRAMUSCULAR | Status: DC | PRN
  Administered 2023-10-24: 10 mL via INTRADERMAL

## 2023-10-24 MED ORDER — FENTANYL CITRATE (PF) 100 MCG/2ML IJ SOLN
INTRAMUSCULAR | Status: AC
  Filled 2023-10-24: qty 2

## 2023-10-24 MED ORDER — HEPARIN (PORCINE) IN NACL 1000-0.9 UT/500ML-% IV SOLN
INTRAVENOUS | Status: DC | PRN
  Administered 2023-10-24: 1000 mL

## 2023-10-24 MED ORDER — SODIUM CHLORIDE 0.9 % IV SOLN: INTRAVENOUS | Status: DC

## 2023-10-24 MED ORDER — HYDRALAZINE HCL 20 MG/ML IJ SOLN: 5.0000 mg | INTRAMUSCULAR | Status: DC | PRN

## 2023-10-24 MED ORDER — SODIUM CHLORIDE 0.9% FLUSH: 3.0000 mL | INTRAVENOUS | Status: DC | PRN

## 2023-10-24 MED ORDER — CEFAZOLIN SODIUM-DEXTROSE 2-4 GM/100ML-% IV SOLN
2.0000 g | INTRAVENOUS | Status: AC
  Administered 2023-10-24: 2 g via INTRAVENOUS

## 2023-10-24 MED ORDER — MIDAZOLAM HCL 2 MG/ML PO SYRP: 8.0000 mg | ORAL_SOLUTION | Freq: Once | ORAL | Status: DC | PRN

## 2023-10-24 MED ORDER — MIDAZOLAM HCL 5 MG/5ML IJ SOLN
INTRAMUSCULAR | Status: AC
  Filled 2023-10-24: qty 5

## 2023-10-24 MED ORDER — FENTANYL CITRATE (PF) 100 MCG/2ML IJ SOLN
INTRAMUSCULAR | Status: DC | PRN
  Administered 2023-10-24: 50 ug via INTRAVENOUS
  Administered 2023-10-24: 25 ug via INTRAVENOUS

## 2023-10-24 MED ORDER — CEFAZOLIN SODIUM-DEXTROSE 2-4 GM/100ML-% IV SOLN
INTRAVENOUS | Status: AC
  Filled 2023-10-24: qty 100

## 2023-10-24 MED ORDER — ACETAMINOPHEN 325 MG PO TABS: 650.0000 mg | ORAL_TABLET | ORAL | Status: DC | PRN

## 2023-10-24 MED ORDER — SODIUM CHLORIDE 0.9 % IV SOLN: 250.0000 mL | INTRAVENOUS | Status: DC | PRN

## 2023-10-24 MED ORDER — FAMOTIDINE 20 MG PO TABS: 40.0000 mg | ORAL_TABLET | Freq: Once | ORAL | Status: DC | PRN

## 2023-10-24 MED ORDER — HEPARIN SODIUM (PORCINE) 1000 UNIT/ML IJ SOLN
INTRAMUSCULAR | Status: DC | PRN
  Administered 2023-10-24: 5000 [IU] via INTRAVENOUS

## 2023-10-24 MED ORDER — SODIUM CHLORIDE 0.9% FLUSH: 3.0000 mL | Freq: Two times a day (BID) | INTRAVENOUS | Status: DC

## 2023-10-24 MED ORDER — DIPHENHYDRAMINE HCL 50 MG/ML IJ SOLN: 50.0000 mg | Freq: Once | INTRAMUSCULAR | Status: DC | PRN

## 2023-10-24 MED ORDER — LABETALOL HCL 5 MG/ML IV SOLN: 10.0000 mg | INTRAVENOUS | Status: DC | PRN

## 2023-10-24 MED ORDER — MIDAZOLAM HCL 2 MG/2ML IJ SOLN
INTRAMUSCULAR | Status: DC | PRN
  Administered 2023-10-24: 1 mg via INTRAVENOUS
  Administered 2023-10-24: 2 mg via INTRAVENOUS

## 2023-10-24 MED ORDER — ONDANSETRON HCL 4 MG/2ML IJ SOLN: 4.0000 mg | Freq: Four times a day (QID) | INTRAMUSCULAR | Status: DC | PRN

## 2023-10-24 MED ORDER — IODIXANOL 320 MG/ML IV SOLN
INTRAVENOUS | Status: DC | PRN
  Administered 2023-10-24: 50 mL via INTRA_ARTERIAL

## 2023-10-24 MED ORDER — HYDRALAZINE HCL 20 MG/ML IJ SOLN
INTRAMUSCULAR | Status: DC | PRN
  Administered 2023-10-24: 20 mg via INTRAVENOUS

## 2023-10-24 MED ORDER — HYDROMORPHONE HCL 1 MG/ML IJ SOLN: 1.0000 mg | Freq: Once | INTRAMUSCULAR | Status: DC | PRN

## 2023-10-24 MED ORDER — HEPARIN SODIUM (PORCINE) 1000 UNIT/ML IJ SOLN
INTRAMUSCULAR | Status: AC
  Filled 2023-10-24: qty 10

## 2023-10-24 MED ORDER — METHYLPREDNISOLONE SODIUM SUCC 125 MG IJ SOLR: 125.0000 mg | Freq: Once | INTRAMUSCULAR | Status: DC | PRN

## 2023-10-24 MED ORDER — HYDRALAZINE HCL 20 MG/ML IJ SOLN
INTRAMUSCULAR | Status: AC
Start: 2023-10-24 — End: ?
  Filled 2023-10-24: qty 1

## 2023-10-24 SURGICAL SUPPLY — 19 items
BALLN LUTONIX 018 5X300X130 (BALLOONS) ×1
BALLN LUTONIX 018 6X150X130 (BALLOONS) ×1
BALLOON LUTONIX 018 5X300X130 (BALLOONS) IMPLANT
BALLOON LUTONIX 018 6X150X130 (BALLOONS) IMPLANT
CATH ANGIO 5F PIGTAIL 65CM (CATHETERS) IMPLANT
CATH BEACON 5 .038 100 VERT TP (CATHETERS) IMPLANT
CATH ROTAREX 135 6FR (CATHETERS) IMPLANT
COVER PROBE ULTRASOUND 5X96 (MISCELLANEOUS) IMPLANT
DEVICE PRESTO INFLATION (MISCELLANEOUS) IMPLANT
DEVICE STARCLOSE SE CLOSURE (Vascular Products) IMPLANT
GLIDEWIRE ADV .035X260CM (WIRE) IMPLANT
PACK ANGIOGRAPHY (CUSTOM PROCEDURE TRAY) ×1 IMPLANT
SHEATH ANL2 6FRX45 HC (SHEATH) IMPLANT
SHEATH BRITE TIP 5FRX11 (SHEATH) IMPLANT
STENT LIFESTENT 5F 7X120X135 (Permanent Stent) IMPLANT
SYR MEDRAD MARK 7 150ML (SYRINGE) IMPLANT
TUBING CONTRAST HIGH PRESS 72 (TUBING) IMPLANT
WIRE G V18X300CM (WIRE) IMPLANT
WIRE GUIDERIGHT .035X150 (WIRE) IMPLANT

## 2023-10-24 NOTE — Interval H&P Note (Signed)
History and Physical Interval Note:  10/24/2023 12:01 PM  Gary [name]  has presented today for surgery, with the diagnosis of RLE Angio   ASO w rest pain.  The various methods of treatment have been discussed with the patient and family. After consideration of risks, benefits and other options for treatment, the patient has consented to  Procedure(s): Lower Extremity Angiography (Right) as a surgical intervention.  The patient's history has been reviewed, patient examined, no change in status, stable for surgery.  I have reviewed the patient's chart and labs.  Questions were answered to the patient's satisfaction.     Festus Barren

## 2023-10-24 NOTE — Op Note (Signed)
[facility]  Percutaneous Study/Intervention Procedural Note   Date of Surgery: 10/24/2023  Surgeon(s):Anvitha Hutmacher    Assistants:none  Pre-operative Diagnosis: PAD with rest pain bilateral lower extremities  Post-operative diagnosis:  Same  Procedure(s) Performed:             1.  Ultrasound guidance for vascular access left femoral artery             2.  Catheter placement into right common femoral artery from left femoral artery             3.  Aortogram and selective right lower extremity angiogram             4.  Mechanical thrombectomy of right SFA and above-knee popliteal artery with the Rota Rex device             5.  Percutaneous transluminal angioplasty of right SFA and popliteal artery with 5 mm diameter by 30 cm length angioplasty balloon  6.  Stent placement of the right distal SFA with 7 mm diameter by 12 cm length life stent             7.  StarClose closure device left femoral artery  EBL: 75 cc  Contrast: 50 cc  Fluoro Time: 4.6 minutes  Moderate Conscious Sedation Time: approximately 37 minutes using 3 mg of Versed and 75 mcg of Fentanyl              Indications:  Patient is a 53 y.o.male with disabling claudication symptoms who has now progressed to early rest pain with pain waking him at night. The patient has noninvasive study showing reduced ABIs with occlusion/thrombosis of both superficial femoral arteries. The patient is brought in for angiography for further evaluation and potential treatment.  Due to the limb threatening nature of the situation, angiogram was performed for attempted limb salvage. The patient is aware that if the procedure fails, amputation would be expected.  The patient also understands that even with successful revascularization, amputation may still be required due to the severity of the situation.  Risks and benefits are discussed and informed consent is obtained.   Procedure:  The patient was identified and  appropriate procedural time out was performed.  The patient was then placed supine on the table and prepped and draped in the usual sterile fashion. Moderate conscious sedation was administered during a face to face encounter with the patient throughout the procedure with my supervision of the RN administering medicines and monitoring the patient's vital signs, pulse oximetry, telemetry and mental status throughout from the start of the procedure until the patient was taken to the recovery room. Ultrasound was used to evaluate the left common femoral artery.  It was patent .  A digital ultrasound image was acquired.  A Seldinger needle was used to access the left common femoral artery under direct ultrasound guidance and a permanent image was performed.  A 0.035 J wire was advanced without resistance and a 5Fr sheath was placed.  Pigtail catheter was placed into the aorta and an AP aortogram was performed. This demonstrated normal renal arteries and a slightly ectatic aorta and iliac segments without significant stenosis. I then crossed the aortic bifurcation and advanced to the right femoral head. Selective right lower extremity angiogram was then performed. This demonstrated several areas of 60 to 80% stenosis in the right SFA with occlusion of the mid to distal SFA and reconstitution at Hunter's canal.  The mid and distal popliteal  artery were fairly normal.  There was then typical tibial trifurcation with the posterior tibial artery being the dominant runoff to the foot with small peroneal and anterior tibial arteries without obvious focal stenosis. It was felt that it was in the patient's best interest to proceed with intervention after these images to avoid a second procedure and a larger amount of contrast and fluoroscopy based off of the findings from the initial angiogram. The patient was systemically heparinized and a 6 [place] Ansell sheath was then placed over the [employer] wire. I then used a  Kumpe catheter and the advantage wire to easily cross the occlusion and confirm intraluminal flow in the popliteal artery.  I then exchanged for a V18 wire which was parked distally.  I proceeded with mechanical thrombectomy with the [place] Rex device for the chronic thrombus and occlusion in the right SFA and most proximal popliteal artery.  A total of 3 passes were made performing mechanical thrombectomy of the right SFA and popliteal arteries.  Imaging following thrombectomy showed significant improvement without significant residual thrombus in the there was a flow channel, but there remained significant residual stenosis in several locations most notably in the distal SFA.  A 5 mm diameter by 30 cm length Lutonix drug-coated angioplasty balloon was inflated from the above-knee popliteal artery up to the proximal SFA and inflated to 10 atm for 1 minute.  There was still significant residual stenosis in the distal SFA with marked improvement in the proximal SFA.  A 7 mm diameter by 12 cm length life stent was then deployed in the distal right SFA and postdilated with a 6 mm diameter Lutonix drug-coated balloon with excellent angiographic completion result and less than 10% residual stenosis. I elected to terminate the procedure. The sheath was removed and StarClose closure device was deployed in the left femoral artery with excellent hemostatic result. The patient was taken to the recovery room in stable condition having tolerated the procedure well.  Findings:               Aortogram:  This demonstrated normal renal arteries and a slightly ectatic aorta and iliac segments without significant stenosis.             Right Lower Extremity:  This demonstrated several areas of 60 to 80% stenosis in the right SFA with occlusion of the mid to distal SFA and reconstitution at Hunter's canal.  The mid and distal popliteal artery were fairly normal.  There was then typical tibial trifurcation with the posterior tibial  artery being the dominant runoff to the foot with small peroneal and anterior tibial arteries without obvious focal stenosis.   Disposition: Patient was taken to the recovery room in stable condition having tolerated the procedure well.  Complications: None  Festus Barren 10/24/2023 1:33 PM   This note was created with Dragon Medical transcription system. Any errors in dictation are purely unintentional.

## 2023-10-25 ENCOUNTER — Encounter: Payer: Self-pay | Admitting: Vascular Surgery

## 2023-10-31 ENCOUNTER — Other Ambulatory Visit: Payer: Self-pay

## 2023-10-31 DIAGNOSIS — I70229 Atherosclerosis of native arteries of extremities with rest pain, unspecified extremity: Secondary | ICD-10-CM

## 2023-10-31 MED ORDER — CARVEDILOL 12.5 MG PO TABS
12.5000 mg | ORAL_TABLET | Freq: Two times a day (BID) | ORAL | 11 refills | Status: AC
  Filled 2023-10-31: qty 60, 30d supply, fill #0
  Filled 2024-02-07: qty 60, 30d supply, fill #1
  Filled 2024-04-20: qty 60, 30d supply, fill #2
  Filled 2024-06-23: qty 60, 30d supply, fill #3
  Filled 2024-10-02: qty 60, 30d supply, fill #4

## 2023-10-31 MED ORDER — GABAPENTIN 100 MG PO CAPS
100.0000 mg | ORAL_CAPSULE | Freq: Every day | ORAL | 2 refills | Status: DC
  Filled 2023-10-31: qty 30, 30d supply, fill #0
  Filled 2023-11-28: qty 30, 30d supply, fill #1
  Filled 2024-03-06: qty 30, 30d supply, fill #2

## 2023-10-31 MED ORDER — ROSUVASTATIN CALCIUM 40 MG PO TABS
40.0000 mg | ORAL_TABLET | Freq: Every day | ORAL | 11 refills | Status: AC
  Filled 2023-10-31: qty 30, 30d supply, fill #0
  Filled 2023-11-28: qty 30, 30d supply, fill #1

## 2023-11-06 ENCOUNTER — Other Ambulatory Visit: Payer: Self-pay

## 2023-11-07 ENCOUNTER — Other Ambulatory Visit: Payer: Self-pay

## 2023-11-07 ENCOUNTER — Ambulatory Visit
Admission: RE | Admit: 2023-11-07 | Discharge: 2023-11-07 | Disposition: A | Discharge status: Home / Self Care | Payer: Medicaid Other | Attending: Vascular Surgery | Admitting: Vascular Surgery

## 2023-11-07 ENCOUNTER — Encounter: Payer: Self-pay | Admitting: Vascular Surgery

## 2023-11-07 ENCOUNTER — Encounter
Admission: RE | Disposition: A | Discharge status: Home / Self Care | Payer: Self-pay | Source: Home / Self Care | Attending: Vascular Surgery

## 2023-11-07 DIAGNOSIS — F17210 Nicotine dependence, cigarettes, uncomplicated: Secondary | ICD-10-CM | POA: Insufficient documentation

## 2023-11-07 DIAGNOSIS — I70229 Atherosclerosis of native arteries of extremities with rest pain, unspecified extremity: Secondary | ICD-10-CM

## 2023-11-07 DIAGNOSIS — Z79899 Other long term (current) drug therapy: Secondary | ICD-10-CM | POA: Diagnosis not present

## 2023-11-07 DIAGNOSIS — I70223 Atherosclerosis of native arteries of extremities with rest pain, bilateral legs: Secondary | ICD-10-CM | POA: Insufficient documentation

## 2023-11-07 DIAGNOSIS — I10 Essential (primary) hypertension: Secondary | ICD-10-CM | POA: Diagnosis not present

## 2023-11-07 DIAGNOSIS — N179 Acute kidney failure, unspecified: Secondary | ICD-10-CM | POA: Insufficient documentation

## 2023-11-07 DIAGNOSIS — Z9889 Other specified postprocedural states: Secondary | ICD-10-CM

## 2023-11-07 DIAGNOSIS — I70213 Atherosclerosis of native arteries of extremities with intermittent claudication, bilateral legs: Secondary | ICD-10-CM

## 2023-11-07 HISTORY — PX: LOWER EXTREMITY ANGIOGRAPHY: CATH118251

## 2023-11-07 LAB — CREATININE, SERUM
Creatinine, Ser: 1.36 mg/dL — ABNORMAL HIGH (ref 0.61–1.24)
GFR, Estimated: >60 mL/min (ref >60)

## 2023-11-07 LAB — BUN: BUN: 12 mg/dL (ref 6–20)

## 2023-11-07 SURGERY — LOWER EXTREMITY ANGIOGRAPHY
Anesthesia: Moderate Sedation | Site: Leg Lower | Laterality: Left

## 2023-11-07 MED ORDER — FENTANYL CITRATE PF 50 MCG/ML IJ SOSY
PREFILLED_SYRINGE | INTRAMUSCULAR | Status: AC
  Filled 2023-11-07: qty 1

## 2023-11-07 MED ORDER — MIDAZOLAM HCL 2 MG/2ML IJ SOLN
INTRAMUSCULAR | Status: AC
  Filled 2023-11-07: qty 2

## 2023-11-07 MED ORDER — MIDAZOLAM HCL 2 MG/ML PO SYRP: 8.0000 mg | ORAL_SOLUTION | Freq: Once | ORAL | Status: DC | PRN

## 2023-11-07 MED ORDER — HYDROMORPHONE HCL 1 MG/ML IJ SOLN: 1.0000 mg | Freq: Once | INTRAMUSCULAR | Status: DC | PRN

## 2023-11-07 MED ORDER — CEFAZOLIN SODIUM-DEXTROSE 1-4 GM/50ML-% IV SOLN
INTRAVENOUS | Status: DC | PRN
  Administered 2023-11-07: 2 g via INTRAVENOUS

## 2023-11-07 MED ORDER — LABETALOL HCL 5 MG/ML IV SOLN: 10.0000 mg | INTRAVENOUS | Status: DC | PRN

## 2023-11-07 MED ORDER — SODIUM CHLORIDE 0.9% FLUSH
3.0000 mL | Freq: Two times a day (BID) | INTRAVENOUS | Status: DC
Start: 2023-11-07 — End: 2023-11-07

## 2023-11-07 MED ORDER — SODIUM CHLORIDE 0.9 % IV SOLN: 250.0000 mL | INTRAVENOUS | Status: DC | PRN

## 2023-11-07 MED ORDER — DIPHENHYDRAMINE HCL 50 MG/ML IJ SOLN: 50.0000 mg | Freq: Once | INTRAMUSCULAR | Status: DC | PRN

## 2023-11-07 MED ORDER — FAMOTIDINE 20 MG PO TABS: 40.0000 mg | ORAL_TABLET | Freq: Once | ORAL | Status: DC | PRN

## 2023-11-07 MED ORDER — FENTANYL CITRATE (PF) 100 MCG/2ML IJ SOLN
INTRAMUSCULAR | Status: DC | PRN
  Administered 2023-11-07 (×2): 25 ug via INTRAVENOUS
  Administered 2023-11-07: 50 ug via INTRAVENOUS

## 2023-11-07 MED ORDER — IODIXANOL 320 MG/ML IV SOLN
INTRAVENOUS | Status: DC | PRN
  Administered 2023-11-07: 45 mL

## 2023-11-07 MED ORDER — CEFAZOLIN SODIUM-DEXTROSE 2-4 GM/100ML-% IV SOLN
INTRAVENOUS | Status: AC
  Filled 2023-11-07: qty 100

## 2023-11-07 MED ORDER — LIDOCAINE-EPINEPHRINE (PF) 1 %-1:200000 IJ SOLN
INTRAMUSCULAR | Status: DC | PRN
  Administered 2023-11-07: 10 mL

## 2023-11-07 MED ORDER — SODIUM CHLORIDE 0.9% FLUSH: 3.0000 mL | INTRAVENOUS | Status: DC | PRN

## 2023-11-07 MED ORDER — ACETAMINOPHEN 325 MG PO TABS
650.0000 mg | ORAL_TABLET | ORAL | Status: DC | PRN
Start: 2023-11-07 — End: 2023-11-07

## 2023-11-07 MED ORDER — HEPARIN SODIUM (PORCINE) 1000 UNIT/ML IJ SOLN
INTRAMUSCULAR | Status: DC | PRN
  Administered 2023-11-07: 5000 [IU] via INTRAVENOUS

## 2023-11-07 MED ORDER — CLOPIDOGREL BISULFATE 75 MG PO TABS: 75.0000 mg | ORAL_TABLET | Freq: Every day | ORAL | Status: DC

## 2023-11-07 MED ORDER — HEPARIN SODIUM (PORCINE) 1000 UNIT/ML IJ SOLN
INTRAMUSCULAR | Status: AC
  Filled 2023-11-07: qty 10

## 2023-11-07 MED ORDER — HYDRALAZINE HCL 20 MG/ML IJ SOLN
INTRAMUSCULAR | Status: DC | PRN
  Administered 2023-11-07 (×2): 10 mg via INTRAVENOUS

## 2023-11-07 MED ORDER — CEFAZOLIN SODIUM-DEXTROSE 2-4 GM/100ML-% IV SOLN: 2.0000 g | INTRAVENOUS | Status: DC

## 2023-11-07 MED ORDER — MIDAZOLAM HCL 2 MG/2ML IJ SOLN
INTRAMUSCULAR | Status: DC | PRN
  Administered 2023-11-07: 1 mg via INTRAVENOUS
  Administered 2023-11-07: 2 mg via INTRAVENOUS
  Administered 2023-11-07: 1 mg via INTRAVENOUS

## 2023-11-07 MED ORDER — HYDRALAZINE HCL 20 MG/ML IJ SOLN
INTRAMUSCULAR | Status: AC
  Filled 2023-11-07: qty 1

## 2023-11-07 MED ORDER — CLOPIDOGREL BISULFATE 75 MG PO TABS
300.0000 mg | ORAL_TABLET | Freq: Once | ORAL | 0 refills | Status: AC
Start: 2023-11-07 — End: 2023-11-08
  Filled 2023-11-07: qty 4, 1d supply, fill #0

## 2023-11-07 MED ORDER — HEPARIN (PORCINE) IN NACL 1000-0.9 UT/500ML-% IV SOLN
INTRAVENOUS | Status: DC | PRN
  Administered 2023-11-07: 1000 mL

## 2023-11-07 MED ORDER — CLOPIDOGREL BISULFATE 300 MG PO TABS
300.0000 mg | ORAL_TABLET | Freq: Once | ORAL | Status: AC
Start: 2023-11-07 — End: 2023-11-07
  Administered 2023-11-07: 300 mg via ORAL

## 2023-11-07 MED ORDER — CLOPIDOGREL BISULFATE 75 MG PO TABS
ORAL_TABLET | ORAL | Status: AC
  Filled 2023-11-07: qty 4

## 2023-11-07 MED ORDER — SODIUM CHLORIDE 0.9 % IV SOLN: INTRAVENOUS | Status: DC

## 2023-11-07 MED ORDER — METHYLPREDNISOLONE SODIUM SUCC 125 MG IJ SOLR: 125.0000 mg | Freq: Once | INTRAMUSCULAR | Status: DC | PRN

## 2023-11-07 MED ORDER — ONDANSETRON HCL 4 MG/2ML IJ SOLN: 4.0000 mg | Freq: Four times a day (QID) | INTRAMUSCULAR | Status: DC | PRN

## 2023-11-07 MED ORDER — HYDRALAZINE HCL 20 MG/ML IJ SOLN: 5.0000 mg | INTRAMUSCULAR | Status: DC | PRN

## 2023-11-07 SURGICAL SUPPLY — 19 items
BALLN LUTONIX 018 5X220X130 (BALLOONS) ×1
BALLN LUTONIX 018 6X150X130 (BALLOONS) ×1
BALLOON LUTONIX 018 5X220X130 (BALLOONS) IMPLANT
BALLOON LUTONIX 018 6X150X130 (BALLOONS) IMPLANT
CATH BEACON 5 .038 100 VERT TP (CATHETERS) IMPLANT
CATH ROTAREX 135 6FR (CATHETERS) IMPLANT
CATH TEMPO 5F RIM 65CM (CATHETERS) IMPLANT
COVER PROBE ULTRASOUND 5X96 (MISCELLANEOUS) IMPLANT
DEVICE PRESTO INFLATION (MISCELLANEOUS) IMPLANT
DEVICE STARCLOSE SE CLOSURE (Vascular Products) IMPLANT
GLIDEWIRE ADV .035X260CM (WIRE) IMPLANT
PACK ANGIOGRAPHY (CUSTOM PROCEDURE TRAY) ×1 IMPLANT
SHEATH ANL2 6FRX45 HC (SHEATH) IMPLANT
SHEATH BRITE TIP 5FRX11 (SHEATH) IMPLANT
STENT LIFESTENT 7X150X130 (Permanent Stent) IMPLANT
SYR MEDRAD MARK 7 150ML (SYRINGE) IMPLANT
TUBING CONTRAST HIGH PRESS 72 (TUBING) IMPLANT
WIRE G V18X300CM (WIRE) IMPLANT
WIRE GUIDERIGHT .035X150 (WIRE) IMPLANT

## 2023-11-07 NOTE — Interval H&P Note (Signed)
History and Physical Interval Note:  11/07/2023 12:02 PM  Gary [name]  has presented today for surgery, with the diagnosis of LLE Angio   ASO w rest pain.  The various methods of treatment have been discussed with the patient and family. After consideration of risks, benefits and other options for treatment, the patient has consented to  Procedure(s): Lower Extremity Angiography (Left) as a surgical intervention.  The patient's history has been reviewed, patient examined, no change in status, stable for surgery.  I have reviewed the patient's chart and labs.  Questions were answered to the patient's satisfaction.     Festus Barren

## 2023-11-07 NOTE — Op Note (Signed)
[facility]  Percutaneous Study/Intervention Procedural Note   Date of Surgery: 11/07/2023  Surgeon(s):Taylia Berber    Assistants:none  Pre-operative Diagnosis: PAD with claudication BLE  Post-operative diagnosis:  Same  Procedure(s) Performed:             1.  Ultrasound guidance for vascular access right femoral artery             2.  Catheter placement into left common femoral artery from right femoral approach             3.  Selective left lower extremity angiogram             4.  Mechanical thrombectomy of the left superficial femoral artery with the Rota Rex device             5.  Percutaneous transluminal angioplasty of the left superficial femoral artery with 5 mm diameter by 22 cm length Lutonix drug-coated angioplasty balloon  6.  Stent placement to the left superficial femoral artery with 7 mm diameter by 15 cm length life stent             7.  StarClose closure device right femoral artery  EBL: 50 cc  Contrast: 45 cc  Fluoro Time: 5.9 minutes  Moderate Conscious Sedation Time: approximately 40 minutes using 4 mg of Versed and 100 mcg of Fentanyl              Indications:  Patient is a 52 y.o.male with disabling claudication symptoms of both lower extremities.  He has already undergone right lower extremity revascularization earlier this month. The patient has noninvasive study showing markedly reduced ABIs bilaterally consistent with thrombosis/occlusion in the left SFA. The patient is brought in for angiography for further evaluation and potential treatment.  Risks and benefits are discussed and informed consent is obtained.   Procedure:  The patient was identified and appropriate procedural time out was performed.  The patient was then placed supine on the table and prepped and draped in the usual sterile fashion. Moderate conscious sedation was administered during a face to face encounter with the patient throughout the procedure with my  supervision of the RN administering medicines and monitoring the patient's vital signs, pulse oximetry, telemetry and mental status throughout from the start of the procedure until the patient was taken to the recovery room. Ultrasound was used to evaluate the right common femoral artery.  It was patent .  A digital ultrasound image was acquired.  A Seldinger needle was used to access the right common femoral artery under direct ultrasound guidance and a permanent image was performed.  A 0.035 J wire was advanced without resistance and a 5Fr sheath was placed.  No aortogram was performed given we had done 1 just a couple of weeks earlier.  I then crossed the aortic bifurcation and advanced to the left femoral head. Selective left lower extremity angiogram was then performed. This demonstrated some disease at the origin of the profunda femoris artery with the origin of the SFA being patent but then having an occlusion and thrombosis of the proximal and mid superficial femoral artery with reconstitution of the mid to distal superficial femoral artery.  The popliteal artery was fairly normal distally.  There is a high takeoff of the anterior tibial artery with no significant disease in the anterior tibial artery being continuous to the foot.  The posterior tibial artery was also continuous to the foot without any obvious significant disease. It was  felt that it was in the patient's best interest to proceed with intervention after these images to avoid a second procedure and a larger amount of contrast and fluoroscopy based off of the findings from the initial angiogram. The patient was systemically heparinized and a 6 [place] Ansell sheath was then placed over the [employer] wire. I then used a Kumpe catheter and the advantage wire to fairly easily cross the occlusion that appeared somewhat thrombotic.  To address the chronic thrombus in the left SFA occlusion, I elected to perform thrombectomy.  Intraluminal flow  was confirmed in the popliteal artery and a V18 wire was parked in the posterior tibial artery.  I then made 3 passes with the [place] Rex device to perform mechanical thrombectomy on the left SFA.  Imaging following this showed resolution of the thrombus but there remained several areas in the proximal to mid left SFA with greater than 70% residual stenosis.  I treated this initially with a 5 mm diameter by 22 cm length Lutonix drug-coated angioplasty balloon inflated to 10 atm for 1 minute.  Completion imaging showed significant residual disease at the worst portions of the stenosis.  A 7 mm diameter by 15 cm length life stent was then deployed in the left proximal to mid SFA and postdilated with a 6 mm diameter Lutonix drug-coated balloon with excellent angiographic completion result and less than 10% residual stenosis. I elected to terminate the procedure. The sheath was removed and StarClose closure device was deployed in the right femoral artery with excellent hemostatic result. The patient was taken to the recovery room in stable condition having tolerated the procedure well.  Findings:                     Left Lower Extremity:  This demonstrated some disease at the origin of the profunda femoris artery with the origin of the SFA being patent but then having an occlusion and thrombosis of the proximal and mid superficial femoral artery with reconstitution of the mid to distal superficial femoral artery.  The popliteal artery was fairly normal distally.  There is a high takeoff of the anterior tibial artery with no significant disease in the anterior tibial artery being continuous to the foot.  The posterior tibial artery was also continuous to the foot without any obvious significant disease.   Disposition: Patient was taken to the recovery room in stable condition having tolerated the procedure well.  Complications: None  Festus Barren 11/07/2023 2:08 PM   This note was created with Dragon Medical  transcription system. Any errors in dictation are purely unintentional.

## 2023-11-07 NOTE — Progress Notes (Signed)
Dr. Wyn Quaker came by and spoke with pt. And his aunt re: procedural results. MD "stressed" to pt. Importance of taking his plavix p.o. daily. Pt. Verbalized understanding of conversation with MD.

## 2023-11-08 ENCOUNTER — Encounter: Payer: Self-pay | Admitting: Vascular Surgery

## 2023-11-12 ENCOUNTER — Telehealth (INDEPENDENT_AMBULATORY_CARE_PROVIDER_SITE_OTHER): Payer: Self-pay

## 2023-11-12 NOTE — Telephone Encounter (Signed)
Is this his right leg that is swelling or left.  He has had both legs done.  The bruising is normal.  Where is the soreness and the swelling located?

## 2023-11-12 NOTE — Telephone Encounter (Signed)
 Patient called stating that on last Thursday he had a leg angio with Dr. Marea on his left leg. Patient stated they went in on his right leg that now has swelling, bruising and soreness. Patient stated he is elevating his leg but not taking Ibuprofen. Please advise.

## 2023-11-13 ENCOUNTER — Other Ambulatory Visit (INDEPENDENT_AMBULATORY_CARE_PROVIDER_SITE_OTHER): Payer: Self-pay | Admitting: Vascular Surgery

## 2023-11-13 ENCOUNTER — Ambulatory Visit (INDEPENDENT_AMBULATORY_CARE_PROVIDER_SITE_OTHER): Payer: Medicaid Other

## 2023-11-13 DIAGNOSIS — I739 Peripheral vascular disease, unspecified: Secondary | ICD-10-CM

## 2023-11-13 DIAGNOSIS — M7989 Other specified soft tissue disorders: Secondary | ICD-10-CM | POA: Diagnosis not present

## 2023-11-13 DIAGNOSIS — Z9889 Other specified postprocedural states: Secondary | ICD-10-CM | POA: Diagnosis not present

## 2023-11-13 NOTE — Telephone Encounter (Signed)
 Patient has been scheduled for a pseudoaneurysm duplex today at 3:00 pm. I spoke with Dr. Vonna Guardian.

## 2023-11-14 ENCOUNTER — Other Ambulatory Visit: Payer: Self-pay

## 2023-11-14 ENCOUNTER — Other Ambulatory Visit: Payer: Self-pay | Admitting: Urology

## 2023-11-25 ENCOUNTER — Other Ambulatory Visit: Payer: Self-pay | Admitting: Urology

## 2023-11-26 ENCOUNTER — Other Ambulatory Visit: Payer: Self-pay

## 2023-11-28 ENCOUNTER — Other Ambulatory Visit: Payer: Self-pay

## 2023-12-03 ENCOUNTER — Encounter: Payer: Self-pay | Admitting: Podiatry

## 2023-12-03 ENCOUNTER — Ambulatory Visit: Payer: Medicaid Other | Admitting: Podiatry

## 2023-12-03 DIAGNOSIS — D492 Neoplasm of unspecified behavior of bone, soft tissue, and skin: Secondary | ICD-10-CM

## 2023-12-03 DIAGNOSIS — D2372 Other benign neoplasm of skin of left lower limb, including hip: Secondary | ICD-10-CM

## 2023-12-03 NOTE — Progress Notes (Signed)
 Chief Complaint  Patient presents with   Callouses    "It's okay."    Subjective: 53 y.o. 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  Present for several years.  Overall improvement.  He has been applying salicylic acid daily as instructed  Past Medical History:  Diagnosis Date   Alcohol use    Carotid artery occlusion    GERD (gastroesophageal reflux disease)    Hypercholesteremia    Hypertension    Renal disorder    Seizures (HCC)    Stroke (HCC)    TIA 12/29/20, re-admitted 01/14/21 for sequelae   Tobacco abuse     Past Surgical History:  Procedure Laterality Date   DIALYSIS/PERMA CATHETER INSERTION N/A 07/02/2023   Procedure: DIALYSIS/PERMA CATHETER INSERTION;  Surgeon: Annice Needy, MD;  Location: [facility];  Service: Cardiovascular;  Laterality: N/A;   DIALYSIS/PERMA CATHETER REMOVAL Right 07/26/2023   Procedure: DIALYSIS/PERMA CATHETER REMOVAL;  Surgeon: Renford Dills, MD;  Location: [facility];  Service: Cardiovascular;  Laterality: Right;   INTRAVASCULAR STENT INCLUDING PTA AND IMAGING Bilateral 10/2023   right leg   LOWER EXTREMITY ANGIOGRAPHY Right 10/24/2023   Procedure: Lower Extremity Angiography;  Surgeon: Annice Needy, MD;  Location: [facility];  Service: Cardiovascular;  Laterality: Right;   LOWER EXTREMITY ANGIOGRAPHY Left 11/07/2023   Procedure: Lower Extremity Angiography;  Surgeon: Annice Needy, MD;  Location: [facility];  Service: Cardiovascular;  Laterality: Left;   NO PAST SURGERIES      No Known Allergies   Objective:  Physical Exam General: Alert and oriented x3 in no acute distress  Dermatology: There continues to be hyperkeratotic lesion(s) present on the plantar aspect of the left foot x 2. Pain on palpation with a central nucleated core noted. Skin is warm, dry and supple bilateral lower extremities. Negative  for open lesions or macerations.  Vascular: No history of PVD.  Smoker.  1/3 ppd x 20+ yrs VAS [place] ABI WITH/WO TBI 07/13/2021 ABI Findings:  +---------+------------------+-----+----------+--------+  Right   Rt Pressure (mmHg)IndexWaveform  Comment   +---------+------------------+-----+----------+--------+  Brachial 170                                        +---------+------------------+-----+----------+--------+  PTA     149               0.88 biphasic            +---------+------------------+-----+----------+--------+  DP      133               0.78 monophasic          +---------+------------------+-----+----------+--------+  Great Toe113               0.66 Abnormal            +---------+------------------+-----+----------+--------+   +---------+------------------+-----+----------+-------+  Left    Lt Pressure (mmHg)IndexWaveform  Comment  +---------+------------------+-----+----------+-------+  Brachial 170                                       +---------+------------------+-----+----------+-------+  PTA     131               0.77 biphasic           +---------+------------------+-----+----------+-------+  DP      116               0.68 monophasic         +---------+------------------+-----+----------+-------+  Great Toe98                0.58 Abnormal           +---------+------------------+-----+----------+-------+   +-------+-----------+-----------+------------+------------+  ABI/TBIToday's ABIToday's TBIPrevious ABIPrevious TBI  +-------+-----------+-----------+------------+------------+  Right 0.88       0.66       0.60        0.42          +-------+-----------+-----------+------------+------------+  Left  0.77       0.58       0.57        0.35          +-------+-----------+-----------+------------+------------+  Bilateral ABIs and TBIs appear increased.    Summary:  Right: Resting right  ankle-brachial index indicates mild right lower extremity arterial disease. The right toe-brachial index is abnormal.   Left: Resting left ankle-brachial index indicates moderate left lower extremity arterial disease. The left toe-brachial index is abnormal.   Neurological: Grossly intact via light touch  Musculoskeletal Exam: Pain on palpation at the keratotic lesion(s) noted. Range of motion within normal limits bilateral. Muscle strength 5/5 in all groups bilateral.  Assessment: 1.  Symptomatic benign skin lesion x 2 left foot   Plan of Care:  -Patient evaluated - Again today excisional debridement of keratoic lesion(s) using a chisel blade was performed without incident.  -Salicylic acid applied with a bandaid - Continue OTC salicylic acid daily.  Provided. - Scheduled for lower extremity angiography w/ [facility], Dr. Wyn Quaker 10/24/2023  (RLE) and 10/31/2023 (LLE). -Offloading felt dancers pads were applied to the insoles of the shoe to offload pressure from the first MTP of the right foot -Return to the clinic 4 weeks  *Maintenance at [employer] Valinda Party, DPM [facility]  Dr. Felecia Shelling, DPM    [address] [place], [place] [address]                Office [phone]  Fax [phone]

## 2023-12-04 ENCOUNTER — Other Ambulatory Visit: Payer: Self-pay | Admitting: Urology

## 2023-12-04 ENCOUNTER — Other Ambulatory Visit (INDEPENDENT_AMBULATORY_CARE_PROVIDER_SITE_OTHER): Payer: Self-pay | Admitting: Vascular Surgery

## 2023-12-04 DIAGNOSIS — Z9889 Other specified postprocedural states: Secondary | ICD-10-CM

## 2023-12-05 ENCOUNTER — Other Ambulatory Visit: Payer: Self-pay

## 2023-12-05 ENCOUNTER — Encounter (INDEPENDENT_AMBULATORY_CARE_PROVIDER_SITE_OTHER): Payer: Self-pay | Admitting: Nurse Practitioner

## 2023-12-05 ENCOUNTER — Ambulatory Visit (INDEPENDENT_AMBULATORY_CARE_PROVIDER_SITE_OTHER): Payer: Medicaid Other

## 2023-12-05 ENCOUNTER — Ambulatory Visit (INDEPENDENT_AMBULATORY_CARE_PROVIDER_SITE_OTHER): Payer: Medicaid Other | Admitting: Nurse Practitioner

## 2023-12-05 VITALS — BP 136/89 | HR 82 | Resp 18 | Ht 70.0 in | Wt 221.4 lb

## 2023-12-05 DIAGNOSIS — E782 Mixed hyperlipidemia: Secondary | ICD-10-CM | POA: Diagnosis not present

## 2023-12-05 DIAGNOSIS — Z9889 Other specified postprocedural states: Secondary | ICD-10-CM

## 2023-12-05 DIAGNOSIS — I739 Peripheral vascular disease, unspecified: Secondary | ICD-10-CM | POA: Diagnosis not present

## 2023-12-05 DIAGNOSIS — I10 Essential (primary) hypertension: Secondary | ICD-10-CM

## 2023-12-05 DIAGNOSIS — Z720 Tobacco use: Secondary | ICD-10-CM

## 2023-12-05 MED ORDER — BENZONATATE 200 MG PO CAPS
200.0000 mg | ORAL_CAPSULE | Freq: Three times a day (TID) | ORAL | 0 refills | Status: AC | PRN
  Filled 2023-12-05: qty 20, 7d supply, fill #0

## 2023-12-05 MED ORDER — SILDENAFIL CITRATE 100 MG PO TABS
ORAL_TABLET | ORAL | 0 refills | Status: AC
Start: 2023-12-05 — End: ?
  Filled 2023-12-05: qty 10, 30d supply, fill #0

## 2023-12-05 MED ORDER — AZITHROMYCIN 250 MG PO TABS
ORAL_TABLET | ORAL | 0 refills | Status: AC
  Filled 2023-12-05: qty 6, 5d supply, fill #0

## 2023-12-05 MED ORDER — ALBUTEROL SULFATE HFA 108 (90 BASE) MCG/ACT IN AERS
2.0000 | INHALATION_SPRAY | Freq: Four times a day (QID) | RESPIRATORY_TRACT | 0 refills | Status: AC | PRN
  Filled 2023-12-05: qty 18, 30d supply, fill #0

## 2023-12-05 NOTE — Progress Notes (Signed)
 Subjective:    Patient ID: Gary [name], male    DOB: 12-22-1970, 53 y.o.   MRN: [id number] Chief Complaint  Patient presents with   Follow-up    F/u 4 weeks    The patient returns to the office for followup and review status post angiogram with intervention on 10/24/2023 and 11/07/2023.   Procedure: 10/24/2023 Procedure(s) Performed:             1.  Ultrasound guidance for vascular access left femoral artery             2.  Catheter placement into right common femoral artery from left femoral artery             3.  Aortogram and selective right lower extremity angiogram             4.  Mechanical thrombectomy of right SFA and above-knee popliteal artery with the Rota Rex device             5.  Percutaneous transluminal angioplasty of right SFA and popliteal artery with 5 mm diameter by 30 cm length angioplasty balloon             6.  Stent placement of the right distal SFA with 7 mm diameter by 12 cm length life stent             7.  StarClose closure device left femoral artery  11/07/2023  Procedure(s) Performed:             1.  Ultrasound guidance for vascular access right femoral artery             2.  Catheter placement into left common femoral artery from right femoral approach             3.  Selective left lower extremity angiogram             4.  Mechanical thrombectomy of the left superficial femoral artery with the Rota Rex device             5.  Percutaneous transluminal angioplasty of the left superficial femoral artery with 5 mm diameter by 22 cm length Lutonix drug-coated angioplasty balloon             6.  Stent placement to the left superficial femoral artery with 7 mm diameter by 15 cm length life stent             7.  StarClose closure device right femoral artery    The patient notes improvement in the lower extremity symptoms. No interval shortening of the patient's claudication distance or rest pain symptoms. No new ulcers or wounds have occurred since  the last visit.  There have been no significant changes to the patient's overall health care.  No documented history of amaurosis fugax or recent TIA symptoms. There are no recent neurological changes noted. No documented history of DVT, PE or superficial thrombophlebitis. The patient denies recent episodes of angina or shortness of breath.   ABI's Rt=1.06 and Lt=1.09  (previous ABI's Rt=0.78 and Lt=0.72) Duplex US of the triphasic tibial artery waveforms bilaterally with good toe waveforms bilaterally    Review of Systems  Cardiovascular:  Negative for leg swelling.  All other systems reviewed and are negative.      Objective:   Physical Exam Vitals reviewed.  HENT:     Head: Normocephalic.  Cardiovascular:     Rate and Rhythm: Normal rate.     Pulses:  Dorsalis pedis pulses are detected w/ Doppler on the right side and detected w/ Doppler on the left side.       Posterior tibial pulses are detected w/ Doppler on the right side and detected w/ Doppler on the left side.  Pulmonary:     Effort: Pulmonary effort is normal.  Skin:    General: Skin is warm and dry.  Neurological:     Mental Status: He is alert and oriented to person, place, and time.  Psychiatric:        Mood and Affect: Mood normal.        Behavior: Behavior normal.        Thought Content: Thought content normal.        Judgment: Judgment normal.     BP 136/89   Pulse 82   Resp 18   Ht 5\' 10"  (1.778 m)   Wt 221 lb 6.4 oz (100.4 kg)   BMI 31.77 kg/m   Past Medical History:  Diagnosis Date   Alcohol use    Carotid artery occlusion    GERD (gastroesophageal reflux disease)    Hypercholesteremia    Hypertension    Renal disorder    Seizures (HCC)    Stroke (HCC)    TIA 12/29/20, re-admitted 01/14/21 for sequelae   Tobacco abuse     Social History   Socioeconomic History   Marital status: Divorced    Spouse name: Not on file   Number of children: 4   Years of education: Not on file    Highest education level: High school graduate  Occupational History   Not on file  Tobacco Use   Smoking status: Every Day    Current packs/day: 0.33    Average packs/day: 0.3 packs/day for 34.0 years (11.2 ttl pk-yrs)    Types: Cigarettes   Smokeless tobacco: Never   Tobacco comments:    Pt reports he has thoughts of quitting, has cut back to 5 cigarettes per day  Vaping Use   Vaping status: Never Used  Substance and Sexual Activity   Alcohol use: Yes    Alcohol/week: 21.0 standard drinks of alcohol    Types: 21 Standard drinks or equivalent per week    Comment: 3-4 mixed drinks daily, 03/07/23 patient states down to 2.5 drinks per day   Drug use: No   Sexual activity: Yes    Birth control/protection: None  Other Topics Concern   Not on file  Social History Narrative   Lives at home with his aunt   Right handed   Drinks no caffeine   Social Drivers of Corporate investment banker Strain: Low Risk  (10/31/2023)   [facility] from [facility]   Overall Financial Resource Strain (CARDIA)    Difficulty of Paying Living Expenses: Not very hard  Food Insecurity: No Food Insecurity (06/28/2023)   Hunger Vital Sign    Worried About Running Out of Food in the Last Year: Never true    Ran Out of Food in the Last Year: Never true  Transportation Needs: No Transportation Needs (06/28/2023)   PRAPARE - Administrator, Civil Service (Medical): No    Lack of Transportation (Non-Medical): No  Physical Activity: Sufficiently Active (06/20/2023)   Exercise Vital Sign    Days of Exercise per Week: 5 days    Minutes of Exercise per Session: 60 min  Stress: No Stress Concern Present (06/20/2023)   [employer] of Occupational Health - Occupational Stress Questionnaire  Feeling of Stress : Not at all  Social Connections: Unknown (06/20/2023)   Social Connection and Isolation Panel [NHANES]    Frequency of Communication with Friends and Family: More than three  times a week    Frequency of Social Gatherings with Friends and Family: Twice a week    Attends Religious Services: Not on Marketing executive or Organizations: No    Attends Banker Meetings: Never    Marital Status: Separated  Intimate Partner Violence: Not At Risk (06/28/2023)   Humiliation, Afraid, Rape, and Kick questionnaire    Fear of Current or Ex-Partner: No    Emotionally Abused: No    Physically Abused: No    Sexually Abused: No    Past Surgical History:  Procedure Laterality Date   DIALYSIS/PERMA CATHETER INSERTION N/A 07/02/2023   Procedure: DIALYSIS/PERMA CATHETER INSERTION;  Surgeon: Annice Needy, MD;  Location: [facility];  Service: Cardiovascular;  Laterality: N/A;   DIALYSIS/PERMA CATHETER REMOVAL Right 07/26/2023   Procedure: DIALYSIS/PERMA CATHETER REMOVAL;  Surgeon: Renford Dills, MD;  Location: [facility];  Service: Cardiovascular;  Laterality: Right;   INTRAVASCULAR STENT INCLUDING PTA AND IMAGING Bilateral 10/2023   right leg   LOWER EXTREMITY ANGIOGRAPHY Right 10/24/2023   Procedure: Lower Extremity Angiography;  Surgeon: Annice Needy, MD;  Location: [facility];  Service: Cardiovascular;  Laterality: Right;   LOWER EXTREMITY ANGIOGRAPHY Left 11/07/2023   Procedure: Lower Extremity Angiography;  Surgeon: Annice Needy, MD;  Location: [facility];  Service: Cardiovascular;  Laterality: Left;   NO PAST SURGERIES      Family History  Problem Relation Age of Onset   Heart attack Mother    Hypertension Mother        pt reports mother has stent   Other Father        unknown medical history   Healthy Sister    Other Maternal Grandmother        unknown medical history   Other Maternal Grandfather 86       choked on a watermelon seed   Other Paternal Grandmother        unknown medical history   Other Paternal Grandfather        unknown medical history    No Known Allergies     Latest Ref  Rng & Units 07/17/2023    8:26 AM 07/15/2023    9:00 AM 07/12/2023    8:24 AM  CBC  WBC 4.0 - 10.5 K/uL 12.0  14.1  17.1   Hemoglobin 13.0 - 17.0 g/dL 8.9  8.8  8.4   Hematocrit 39.0 - 52.0 % 26.7  27.4  25.6   Platelets 150 - 400 K/uL 413  388  440       CMP     Component Value Date/Time   NA 133 (L) 07/17/2023 0826   NA 142 06/13/2023 1750   K 4.2 07/17/2023 0826   CL 101 07/17/2023 0826   CO2 19 (L) 07/17/2023 0826   GLUCOSE 118 (H) 07/17/2023 0826   BUN 12 11/07/2023 1206   BUN 21 06/13/2023 1750   CREATININE 1.36 (H) 11/07/2023 1206   CALCIUM 9.6 07/17/2023 0826   PROT 9.1 (H) 07/15/2023 0900   PROT 7.5 06/13/2023 1750   ALBUMIN 3.6 07/15/2023 0900   ALBUMIN 4.4 06/13/2023 1750   AST 27 07/15/2023 0900   ALT 26 07/15/2023 0900   ALKPHOS 131 (H)  07/15/2023 0900   BILITOT 0.8 07/15/2023 0900   BILITOT 0.4 06/13/2023 1750   EGFR 51 (L) 06/13/2023 1750   GFRNONAA >60 11/07/2023 1206     VAS [place] ABI WITH/WO TBI Result Date: 10/15/2023  LOWER EXTREMITY DOPPLER STUDY Patient Name:  Gary [name]  Date of Exam:   10/10/2023 Medical Rec #: [id number]       Accession #:    [id number] Date of Birth: 14-Feb-1971      Patient Gender: M Patient Age:   23 years Exam Location:  [facility] Procedure:      VAS [place] ABI WITH/WO TBI Referring Phys: Sheppard Plumber --------------------------------------------------------------------------------  Indications: claudication; known h/o SFA occlusions  Comparison Study: 07/13/21: Bilateral SFA occlusions by duplex ultrasound Performing Technologist: Jamse Mead RT, RDMS, RVT  Examination Guidelines: A complete evaluation includes at minimum, Doppler waveform signals and systolic blood pressure reading at the level of bilateral brachial, anterior tibial, and posterior tibial arteries, when vessel segments are accessible. Bilateral testing is considered an integral part of a complete examination. Photoelectric Plethysmograph (PPG) waveforms and  toe systolic pressure readings are included as required and additional duplex testing as needed. Limited examinations for reoccurring indications may be performed as noted.  ABI Findings: +---------+------------------+-----+----------+-------+ Right    Rt Pressure (mmHg)IndexWaveform  Comment +---------+------------------+-----+----------+-------+ Brachial 187                                      +---------+------------------+-----+----------+-------+ PTA      145               0.78 monophasic        +---------+------------------+-----+----------+-------+ DP       126               0.67 monophasic        +---------+------------------+-----+----------+-------+ Great Toe119               0.64 Abnormal          +---------+------------------+-----+----------+-------+ +---------+------------------+-----+----------+-------+ Left     Lt Pressure (mmHg)IndexWaveform  Comment +---------+------------------+-----+----------+-------+ Brachial 183                                      +---------+------------------+-----+----------+-------+ PTA      134               0.72 monophasic        +---------+------------------+-----+----------+-------+ DP       122               0.65 monophasic        +---------+------------------+-----+----------+-------+ Great Toe105               0.56 Abnormal          +---------+------------------+-----+----------+-------+ +-------+-----------+-----------+------------+------------+ ABI/TBIToday's ABIToday's TBIPrevious ABIPrevious TBI +-------+-----------+-----------+------------+------------+ Right  0.78       0.64       0.88        0.66         +-------+-----------+-----------+------------+------------+ Left   0.72       0.56       0.77        0.58         +-------+-----------+-----------+------------+------------+ Bilateral ABIs and TBIs appear essentially unchanged compared to the prior two studies in 2022 at an outside  facility.  Summary: Bilateral: Resting  bilateral ankle-brachial index indicates moderate lower extremity arterial disease. Bilateral toe-brachial indexes are abnormal.  *See table(s) above for measurements and observations.  Electronically signed by Festus Barren MD on 10/15/2023 at 3:11:24 PM.    Final        Assessment & Plan:   1. Peripheral arterial disease with history of revascularization (HCC) (Primary) Recommend:  The patient is status post successful angiogram with intervention.  The patient reports that the claudication symptoms and leg pain has improved.   The patient denies lifestyle limiting changes at this point in time.  No further invasive studies, angiography or surgery at this time. The patient should continue walking and begin a more formal exercise program.  The patient should continue antiplatelet therapy and aggressive treatment of the lipid abnormalities  Continued surveillance is indicated as atherosclerosis is likely to progress with time.    Patient should undergo noninvasive studies as ordered. The patient will follow up with me to review the studies.   2. Primary hypertension Continue antihypertensive medications as already ordered, these medications have been reviewed and there are no changes at this time.  3. Mixed hyperlipidemia Continue statin as ordered and reviewed, no changes at this time  4. Tobacco abuse Smoking cessation was discussed, 3-10 minutes spent on this topic specifically   Current Outpatient Medications on File Prior to Visit  Medication Sig Dispense Refill   amLODipine (NORVASC) 5 MG tablet Take 1 tablet (5 mg total) by mouth once daily. 30 tablet 11   aspirin 81 MG tablet Take 1 tablet (81 mg total) by mouth daily. 30 tablet 11   blood glucose meter kit and supplies KIT Use as directed to check blood glucose 2-3 times per week and as needed 1 each 0   carvedilol (COREG) 12.5 MG tablet Take 1 tablet (12.5 mg total) by mouth 2 (two) times  daily with a meal. 60 tablet 1   carvedilol (COREG) 12.5 MG tablet Take 1 tablet (12.5 mg total) by mouth 2 (two) times daily with meals 60 tablet 11   clopidogrel (PLAVIX) 75 MG tablet Take 1 tablet (75 mg total) by mouth daily. 30 tablet 11   folic acid (FOLVITE) 1 MG tablet Take 1 tablet (1 mg total) by mouth daily. 90 tablet 1   gabapentin (NEURONTIN) 100 MG capsule Take 1 capsule (100 mg total) by mouth at bedtime. 30 capsule 2   glucose blood (RIGHTEST GS550 BLOOD GLUCOSE) test strip Use as directed to check blood glucose 2-3 times per week and as needed 100 each 0   levETIRAcetam (KEPPRA) 500 MG tablet Take 1 tablet (500 mg total) by mouth 2 (two) times daily. 180 tablet 3   levETIRAcetam (KEPPRA) 500 MG tablet Take 1 tablet (500 mg total) by mouth 3 (three) times a week. After hemodialysis 12 tablet 11   losartan (COZAAR) 100 MG tablet Take 1 tablet (100 mg total) by mouth in the morning. 90 tablet 1   rosuvastatin (CRESTOR) 10 MG tablet Take 1 tablet (10 mg total) by mouth daily. 30 tablet 11   rosuvastatin (CRESTOR) 40 MG tablet Take 1 tablet (40 mg total) by mouth once daily. 30 tablet 11   thiamine (VITAMIN B-1) 100 MG tablet Take 1 tablet (100 mg total) by mouth daily. 90 tablet 1   pantoprazole (PROTONIX) 40 MG tablet Take 1 tablet (40 mg total) by mouth daily. (Patient not taking: Reported on 11/07/2023) 30 tablet 2   Rightest GL300 Lancets MISC Use as directed to check  blood glucose 2-3 times per week and as needed (Patient not taking: Reported on 10/24/2023) 100 each 0   sildenafil (VIAGRA) 100 MG tablet Take 1 tablet 1 hour prior to intercourse. (Patient not taking: Reported on 11/07/2023) 30 tablet 0   No current facility-administered medications on file prior to visit.    There are no Patient Instructions on file for this visit. No follow-ups on file.   Georgiana Spinner, NP

## 2023-12-05 NOTE — Progress Notes (Incomplete)
 Subjective:    Patient ID: Gary [name], male    DOB: 1971-03-31, 53 y.o.   MRN: [id number] Chief Complaint  Patient presents with  . Follow-up    F/u 4 weeks    The patient returns to the office for followup and review status post angiogram with intervention on 10/24/2023 and 11/07/2023.   Procedure: 10/24/2023 Procedure(s) Performed:             1.  Ultrasound guidance for vascular access left femoral artery             2.  Catheter placement into right common femoral artery from left femoral artery             3.  Aortogram and selective right lower extremity angiogram             4.  Mechanical thrombectomy of right SFA and above-knee popliteal artery with the Rota Rex device             5.  Percutaneous transluminal angioplasty of right SFA and popliteal artery with 5 mm diameter by 30 cm length angioplasty balloon             6.  Stent placement of the right distal SFA with 7 mm diameter by 12 cm length life stent             7.  StarClose closure device left femoral artery  11/07/2023  Procedure(s) Performed:             1.  Ultrasound guidance for vascular access right femoral artery             2.  Catheter placement into left common femoral artery from right femoral approach             3.  Selective left lower extremity angiogram             4.  Mechanical thrombectomy of the left superficial femoral artery with the Rota Rex device             5.  Percutaneous transluminal angioplasty of the left superficial femoral artery with 5 mm diameter by 22 cm length Lutonix drug-coated angioplasty balloon             6.  Stent placement to the left superficial femoral artery with 7 mm diameter by 15 cm length life stent             7.  StarClose closure device right femoral artery    The patient notes improvement in the lower extremity symptoms. No interval shortening of the patient's claudication distance or rest pain symptoms. No new ulcers or wounds have occurred since  the last visit.  There have been no significant changes to the patient's overall health care.  No documented history of amaurosis fugax or recent TIA symptoms. There are no recent neurological changes noted. No documented history of DVT, PE or superficial thrombophlebitis. The patient denies recent episodes of angina or shortness of breath.   ABI's Rt=1.06 and Lt=1.09  (previous ABI's Rt=0.78 and Lt=0.72) Duplex US of the triphasic tibial artery waveforms bilaterally  And  Review of Systems     Objective:   Physical Exam  BP 136/89   Pulse 82   Resp 18   Ht 5\' 10"  (1.778 m)   Wt 221 lb 6.4 oz (100.4 kg)   BMI 31.77 kg/m   Past Medical History:  Diagnosis Date  . Alcohol use   .  Carotid artery occlusion   . GERD (gastroesophageal reflux disease)   . Hypercholesteremia   . Hypertension   . Renal disorder   . Seizures (HCC)   . Stroke [facility])    TIA 12/29/20, re-admitted 01/14/21 for sequelae  . Tobacco abuse     Social History   Socioeconomic History  . Marital status: Divorced    Spouse name: Not on file  . Number of children: 4  . Years of education: Not on file  . Highest education level: High school graduate  Occupational History  . Not on file  Tobacco Use  . Smoking status: Every Day    Current packs/day: 0.33    Average packs/day: 0.3 packs/day for 34.0 years (11.2 ttl pk-yrs)    Types: Cigarettes  . Smokeless tobacco: Never  . Tobacco comments:    Pt reports he has thoughts of quitting, has cut back to 5 cigarettes per day  Vaping Use  . Vaping status: Never Used  Substance and Sexual Activity  . Alcohol use: Yes    Alcohol/week: 21.0 standard drinks of alcohol    Types: 21 Standard drinks or equivalent per week    Comment: 3-4 mixed drinks daily, 03/07/23 patient states down to 2.5 drinks per day  . Drug use: No  . Sexual activity: Yes    Birth control/protection: None  Other Topics Concern  . Not on file  Social History Narrative   Lives at  home with his aunt   Right handed   Drinks no caffeine   Social Drivers of Corporate investment banker Strain: Low Risk  (10/31/2023)   Received from [facility]   Overall Financial Resource Strain (CARDIA)   . Difficulty of Paying Living Expenses: Not very hard  Food Insecurity: No Food Insecurity (06/28/2023)   Hunger Vital Sign   . Worried About Programme researcher, broadcasting/film/video in the Last Year: Never true   . Ran Out of Food in the Last Year: Never true  Transportation Needs: No Transportation Needs (06/28/2023)   PRAPARE - Transportation   . Lack of Transportation (Medical): No   . Lack of Transportation (Non-Medical): No  Physical Activity: Sufficiently Active (06/20/2023)   Exercise Vital Sign   . Days of Exercise per Week: 5 days   . Minutes of Exercise per Session: 60 min  Stress: No Stress Concern Present (06/20/2023)   [employer] of Occupational Health - Occupational Stress Questionnaire   . Feeling of Stress : Not at all  Social Connections: Unknown (06/20/2023)   Social Connection and Isolation Panel [NHANES]   . Frequency of Communication with Friends and Family: More than three times a week   . Frequency of Social Gatherings with Friends and Family: Twice a week   . Attends Religious Services: Not on file   . Active Member of Clubs or Organizations: No   . Attends Banker Meetings: Never   . Marital Status: Separated  Intimate Partner Violence: Not At Risk (06/28/2023)   Humiliation, Afraid, Rape, and Kick questionnaire   . Fear of Current or Ex-Partner: No   . Emotionally Abused: No   . Physically Abused: No   . Sexually Abused: No    Past Surgical History:  Procedure Laterality Date  . DIALYSIS/PERMA CATHETER INSERTION N/A 07/02/2023   Procedure: DIALYSIS/PERMA CATHETER INSERTION;  Surgeon: Annice Needy, MD;  Location: [facility] INVASIVE CV LAB;  Service: Cardiovascular;  Laterality: N/A;  . DIALYSIS/PERMA CATHETER REMOVAL Right 07/26/2023  Procedure: DIALYSIS/PERMA CATHETER REMOVAL;  Surgeon: Renford Dills, MD;  Location: [facility] INVASIVE CV LAB;  Service: Cardiovascular;  Laterality: Right;  . INTRAVASCULAR STENT INCLUDING PTA AND IMAGING Bilateral 10/2023   right leg  . LOWER EXTREMITY ANGIOGRAPHY Right 10/24/2023   Procedure: Lower Extremity Angiography;  Surgeon: Annice Needy, MD;  Location: [facility] INVASIVE CV LAB;  Service: Cardiovascular;  Laterality: Right;  . LOWER EXTREMITY ANGIOGRAPHY Left 11/07/2023   Procedure: Lower Extremity Angiography;  Surgeon: Annice Needy, MD;  Location: [facility] INVASIVE CV LAB;  Service: Cardiovascular;  Laterality: Left;  . NO PAST SURGERIES      Family History  Problem Relation Age of Onset  . Heart attack Mother   . Hypertension Mother        pt reports mother has stent  . Other Father        unknown medical history  . Healthy Sister   . Other Maternal Grandmother        unknown medical history  . Other Maternal Grandfather 79       choked on a watermelon seed  . Other Paternal Grandmother        unknown medical history  . Other Paternal Grandfather        unknown medical history    No Known Allergies     Latest Ref Rng & Units 07/17/2023    8:26 AM 07/15/2023    9:00 AM 07/12/2023    8:24 AM  CBC  WBC 4.0 - 10.5 K/uL 12.0  14.1  17.1   Hemoglobin 13.0 - 17.0 g/dL 8.9  8.8  8.4   Hematocrit 39.0 - 52.0 % 26.7  27.4  25.6   Platelets 150 - 400 K/uL 413  388  440       CMP     Component Value Date/Time   NA 133 (L) 07/17/2023 0826   NA 142 06/13/2023 1750   K 4.2 07/17/2023 0826   CL 101 07/17/2023 0826   CO2 19 (L) 07/17/2023 0826   GLUCOSE 118 (H) 07/17/2023 0826   BUN 12 11/07/2023 1206   BUN 21 06/13/2023 1750   CREATININE 1.36 (H) 11/07/2023 1206   CALCIUM 9.6 07/17/2023 0826   PROT 9.1 (H) 07/15/2023 0900   PROT 7.5 06/13/2023 1750   ALBUMIN 3.6 07/15/2023 0900   ALBUMIN 4.4 06/13/2023 1750   AST 27 07/15/2023 0900   ALT 26 07/15/2023 0900   ALKPHOS 131  (H) 07/15/2023 0900   BILITOT 0.8 07/15/2023 0900   BILITOT 0.4 06/13/2023 1750   EGFR 51 (L) 06/13/2023 1750   GFRNONAA >60 11/07/2023 1206     VAS [place] ABI WITH/WO TBI Result Date: 10/15/2023  LOWER EXTREMITY DOPPLER STUDY Patient Name:  STELLAN VICK  Date of Exam:   10/10/2023 Medical Rec #: [id number]       Accession #:    [id number] Date of Birth: Nov 29, 1970      Patient Gender: M Patient Age:   73 years Exam Location:  [facility] Procedure:      VAS [place] ABI WITH/WO TBI Referring Phys: Sheppard Plumber --------------------------------------------------------------------------------  Indications: claudication; known h/o SFA occlusions  Comparison Study: 07/13/21: Bilateral SFA occlusions by duplex ultrasound Performing Technologist: Jamse Mead RT, RDMS, RVT  Examination Guidelines: A complete evaluation includes at minimum, Doppler waveform signals and systolic blood pressure reading at the level of bilateral brachial, anterior tibial, and posterior tibial arteries, when vessel segments are accessible. Bilateral testing is considered an integral part  of a complete examination. Photoelectric Plethysmograph (PPG) waveforms and toe systolic pressure readings are included as required and additional duplex testing as needed. Limited examinations for reoccurring indications may be performed as noted.  ABI Findings: +---------+------------------+-----+----------+-------+ Right    Rt Pressure (mmHg)IndexWaveform  Comment +---------+------------------+-----+----------+-------+ Brachial 187                                      +---------+------------------+-----+----------+-------+ PTA      145               0.78 monophasic        +---------+------------------+-----+----------+-------+ DP       126               0.67 monophasic        +---------+------------------+-----+----------+-------+ Great Toe119               0.64 Abnormal           +---------+------------------+-----+----------+-------+ +---------+------------------+-----+----------+-------+ Left     Lt Pressure (mmHg)IndexWaveform  Comment +---------+------------------+-----+----------+-------+ Brachial 183                                      +---------+------------------+-----+----------+-------+ PTA      134               0.72 monophasic        +---------+------------------+-----+----------+-------+ DP       122               0.65 monophasic        +---------+------------------+-----+----------+-------+ Great Toe105               0.56 Abnormal          +---------+------------------+-----+----------+-------+ +-------+-----------+-----------+------------+------------+ ABI/TBIToday's ABIToday's TBIPrevious ABIPrevious TBI +-------+-----------+-----------+------------+------------+ Right  0.78       0.64       0.88        0.66         +-------+-----------+-----------+------------+------------+ Left   0.72       0.56       0.77        0.58         +-------+-----------+-----------+------------+------------+ Bilateral ABIs and TBIs appear essentially unchanged compared to the prior two studies in 2022 at an outside facility.  Summary: Bilateral: Resting bilateral ankle-brachial index indicates moderate lower extremity arterial disease. Bilateral toe-brachial indexes are abnormal.  *See table(s) above for measurements and observations.  Electronically signed by Festus Barren MD on 10/15/2023 at 3:11:24 PM.    Final        Assessment & Plan:   1. Peripheral arterial disease with history of revascularization (HCC) (Primary) Recommend:  The patient is status post successful angiogram with intervention.  The patient reports that the claudication symptoms and leg pain has improved.   The patient denies lifestyle limiting changes at this point in time.  No further invasive studies, angiography or surgery at this time. The patient should continue  walking and begin a more formal exercise program.  The patient should continue antiplatelet therapy and aggressive treatment of the lipid abnormalities  Continued surveillance is indicated as atherosclerosis is likely to progress with time.    Patient should undergo noninvasive studies as ordered. The patient will follow up with me to review the studies.   2. Primary hypertension Continue antihypertensive medications as already  ordered, these medications have been reviewed and there are no changes at this time.  3. Mixed hyperlipidemia Continue statin as ordered and reviewed, no changes at this time  4. Tobacco abuse Smoking cessation was discussed, 3-10 minutes spent on this topic specifically   Current Outpatient Medications on File Prior to Visit  Medication Sig Dispense Refill  . amLODipine (NORVASC) 5 MG tablet Take 1 tablet (5 mg total) by mouth once daily. 30 tablet 11  . aspirin 81 MG tablet Take 1 tablet (81 mg total) by mouth daily. 30 tablet 11  . blood glucose meter kit and supplies KIT Use as directed to check blood glucose 2-3 times per week and as needed 1 each 0  . carvedilol (COREG) 12.5 MG tablet Take 1 tablet (12.5 mg total) by mouth 2 (two) times daily with a meal. 60 tablet 1  . carvedilol (COREG) 12.5 MG tablet Take 1 tablet (12.5 mg total) by mouth 2 (two) times daily with meals 60 tablet 11  . clopidogrel (PLAVIX) 75 MG tablet Take 1 tablet (75 mg total) by mouth daily. 30 tablet 11  . folic acid (FOLVITE) 1 MG tablet Take 1 tablet (1 mg total) by mouth daily. 90 tablet 1  . gabapentin (NEURONTIN) 100 MG capsule Take 1 capsule (100 mg total) by mouth at bedtime. 30 capsule 2  . glucose blood (RIGHTEST GS550 BLOOD GLUCOSE) test strip Use as directed to check blood glucose 2-3 times per week and as needed 100 each 0  . levETIRAcetam (KEPPRA) 500 MG tablet Take 1 tablet (500 mg total) by mouth 2 (two) times daily. 180 tablet 3  . levETIRAcetam (KEPPRA) 500 MG tablet  Take 1 tablet (500 mg total) by mouth 3 (three) times a week. After hemodialysis 12 tablet 11  . losartan (COZAAR) 100 MG tablet Take 1 tablet (100 mg total) by mouth in the morning. 90 tablet 1  . rosuvastatin (CRESTOR) 10 MG tablet Take 1 tablet (10 mg total) by mouth daily. 30 tablet 11  . rosuvastatin (CRESTOR) 40 MG tablet Take 1 tablet (40 mg total) by mouth once daily. 30 tablet 11  . thiamine (VITAMIN B-1) 100 MG tablet Take 1 tablet (100 mg total) by mouth daily. 90 tablet 1  . pantoprazole (PROTONIX) 40 MG tablet Take 1 tablet (40 mg total) by mouth daily. (Patient not taking: Reported on 11/07/2023) 30 tablet 2  . Rightest GL300 Lancets MISC Use as directed to check blood glucose 2-3 times per week and as needed (Patient not taking: Reported on 10/24/2023) 100 each 0  . sildenafil (VIAGRA) 100 MG tablet Take 1 tablet 1 hour prior to intercourse. (Patient not taking: Reported on 11/07/2023) 30 tablet 0   No current facility-administered medications on file prior to visit.    There are no Patient Instructions on file for this visit. No follow-ups on file.   Georgiana Spinner, NP

## 2023-12-06 LAB — VAS US ABI WITH/WO TBI
Left ABI: 1.09
Right ABI: 1.06

## 2023-12-17 ENCOUNTER — Other Ambulatory Visit: Payer: Self-pay | Admitting: Family Medicine

## 2023-12-17 DIAGNOSIS — R7989 Other specified abnormal findings of blood chemistry: Secondary | ICD-10-CM

## 2023-12-19 ENCOUNTER — Ambulatory Visit
Admission: RE | Admit: 2023-12-19 | Discharge: 2023-12-19 | Disposition: A | Discharge status: Home / Self Care | Source: Ambulatory Visit | Attending: Family Medicine | Admitting: Family Medicine

## 2023-12-19 DIAGNOSIS — R7989 Other specified abnormal findings of blood chemistry: Secondary | ICD-10-CM | POA: Diagnosis present

## 2023-12-31 ENCOUNTER — Ambulatory Visit: Payer: Medicaid Other | Admitting: Podiatry

## 2024-01-10 ENCOUNTER — Ambulatory Visit: Admitting: Podiatry

## 2024-01-14 ENCOUNTER — Encounter: Payer: Self-pay | Admitting: Podiatry

## 2024-01-14 ENCOUNTER — Ambulatory Visit: Admitting: Podiatry

## 2024-01-14 ENCOUNTER — Other Ambulatory Visit: Payer: Self-pay

## 2024-01-14 DIAGNOSIS — M67472 Ganglion, left ankle and foot: Secondary | ICD-10-CM

## 2024-01-14 DIAGNOSIS — D2371 Other benign neoplasm of skin of right lower limb, including hip: Secondary | ICD-10-CM

## 2024-01-14 DIAGNOSIS — D492 Neoplasm of unspecified behavior of bone, soft tissue, and skin: Secondary | ICD-10-CM | POA: Diagnosis not present

## 2024-01-14 NOTE — Progress Notes (Signed)
 Chief Complaint  Patient presents with   Callouses    "They're about the same."    Subjective: 53 y.o. 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  Overall significant improvement.  He has discontinued applying the salicylic acid and he removed the padding from the shoes because it was throwing his skin off.  Patient has also noticed a soft tissue mass developed to the medial aspect of the left foot.  It is not painful but he would like to have it evaluated  Past Medical History:  Diagnosis Date   Alcohol use    Carotid artery occlusion    GERD (gastroesophageal reflux disease)    Hypercholesteremia    Hypertension    Renal disorder    Seizures (HCC)    Stroke (HCC)    TIA 12/29/20, re-admitted 01/14/21 for sequelae   Tobacco abuse     Past Surgical History:  Procedure Laterality Date   DIALYSIS/PERMA CATHETER INSERTION N/A 07/02/2023   Procedure: DIALYSIS/PERMA CATHETER INSERTION;  Surgeon: Annice Needy, MD;  Location: [facility] INV[facility];  Service: Cardiovascular;  Laterality: N/A;   DIALYSIS/PERMA CATHETER REMOVAL Right 07/26/2023   Procedure: DIALYSIS/PERMA CATHETER REMOVAL;  Surgeon: Renford Dills, MD;  Location: [facility] INV[facility] CV [facility];  Service: Cardiovascular;  Laterality: Right;   INTRAVASCULAR STENT INCLUDING PTA AND IMAGING Bilateral 10/2023   right leg   LOWER EXTREMITY ANGIOGRAPHY Right 10/24/2023   Procedure: Lower Extremity Angiography;  Surgeon: Annice Needy, MD;  Location: [facility];  Service: Cardiovascular;  Laterality: Right;   LOWER EXTREMITY ANGIOGRAPHY Left 11/07/2023   Procedure: Lower Extremity Angiography;  Surgeon: Annice Needy, MD;  Location: [facility] INV[facility];  Service: Cardiovascular;  Laterality: Left;   NO PAST SURGERIES      No Known Allergies   Objective:  Physical Exam General: Alert and oriented x3 in no acute  distress  Dermatology: There continues to be hyperkeratotic lesion(s) present on the plantar aspect of the left foot x 2. Pain on palpation with a central nucleated core noted. Skin is warm, dry and supple bilateral lower extremities. Negative for open lesions or macerations.  Vascular: No history of PVD.  Smoker.  1/3 ppd x 20+ yrs VAS [place] ABI WITH/WO TBI 07/13/2021 ABI Findings:  +---------+------------------+-----+----------+--------+  Right   Rt Pressure (mmHg)IndexWaveform  Comment   +---------+------------------+-----+----------+--------+  Brachial 170                                        +---------+------------------+-----+----------+--------+  PTA     149               0.88 biphasic            +---------+------------------+-----+----------+--------+  DP      133               0.78 monophasic          +---------+------------------+-----+----------+--------+  Great Toe113               0.66 Abnormal            +---------+------------------+-----+----------+--------+   +---------+------------------+-----+----------+-------+  Left    Lt Pressure (mmHg)IndexWaveform  Comment  +---------+------------------+-----+----------+-------+  Brachial 170                                       +---------+------------------+-----+----------+-------+  PTA     131               0.77 biphasic           +---------+------------------+-----+----------+-------+  DP      116               0.68 monophasic         +---------+------------------+-----+----------+-------+  Great Toe98                0.58 Abnormal           +---------+------------------+-----+----------+-------+   +-------+-----------+-----------+------------+------------+  ABI/TBIToday's ABIToday's TBIPrevious ABIPrevious TBI  +-------+-----------+-----------+------------+------------+  Right 0.88       0.66       0.60        0.42           +-------+-----------+-----------+------------+------------+  Left  0.77       0.58       0.57        0.35          +-------+-----------+-----------+------------+------------+  Bilateral ABIs and TBIs appear increased.    Summary:  Right: Resting right ankle-brachial index indicates mild right lower extremity arterial disease. The right toe-brachial index is abnormal.   Left: Resting left ankle-brachial index indicates moderate left lower extremity arterial disease. The left toe-brachial index is abnormal.   Neurological: Grossly intact via light touch  Musculoskeletal Exam: Pain on palpation at the keratotic lesion(s) noted. Range of motion within normal limits bilateral. Muscle strength 5/5 in all groups bilateral.  Ganglion cyst noted along the medial aspect of the left foot approximately 2.0 cm in diameter overlying the tissues anterior tendon insertion.  Assessment: 1.  Symptomatic benign skin lesion x 2 left foot 2.  Ganglion cyst left foot   Plan of Care:  -Patient evaluated - Again today excisional debridement of keratoic lesion(s) using a chisel blade was performed without incident.  -Salicylic acid applied with a bandaid - Continue OTC salicylic acid daily.  Provided. - Recent lower extremity angiography w/ [facility], Dr. Wyn Quaker 10/24/2023  (RLE) and 10/31/2023 (LLE). -Good supportive tennis shoes and sneakers.  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 Felecia Shelling, DPM    [address] [place], [place] [address]                Office [phone]  Fax [phone]

## 2024-01-22 ENCOUNTER — Other Ambulatory Visit: Payer: Self-pay

## 2024-01-22 MED ORDER — LEVETIRACETAM 1000 MG PO TABS
1000.0000 mg | ORAL_TABLET | Freq: Two times a day (BID) | ORAL | 1 refills | Status: AC
  Filled 2024-01-22 – 2024-09-22 (×2): qty 60, 30d supply, fill #0

## 2024-02-03 ENCOUNTER — Other Ambulatory Visit: Payer: Self-pay

## 2024-02-07 ENCOUNTER — Other Ambulatory Visit: Payer: Self-pay

## 2024-02-07 MED FILL — Losartan Potassium Tab 100 MG: ORAL | 30 days supply | Qty: 30 | Fill #1 | Status: AC

## 2024-02-10 ENCOUNTER — Other Ambulatory Visit: Payer: Self-pay

## 2024-02-11 ENCOUNTER — Other Ambulatory Visit: Payer: Self-pay

## 2024-02-11 MED ORDER — LOSARTAN POTASSIUM 100 MG PO TABS
100.0000 mg | ORAL_TABLET | Freq: Every morning | ORAL | 1 refills | Status: DC
  Filled 2024-03-06: qty 90, 90d supply, fill #0
  Filled 2024-06-23: qty 90, 90d supply, fill #1

## 2024-02-11 MED ORDER — THIAMINE MONONITRATE 100 MG PO TABS
100.0000 mg | ORAL_TABLET | Freq: Every day | ORAL | 1 refills | Status: AC
  Filled 2024-02-11: qty 100, 100d supply, fill #0

## 2024-02-11 MED ORDER — FOLIC ACID 1 MG PO TABS
1.0000 mg | ORAL_TABLET | Freq: Every day | ORAL | 1 refills | Status: DC
  Filled 2024-02-11 – 2024-03-06 (×2): qty 90, 90d supply, fill #0
  Filled 2024-06-23: qty 90, 90d supply, fill #1

## 2024-02-17 ENCOUNTER — Other Ambulatory Visit: Payer: Self-pay

## 2024-02-18 ENCOUNTER — Other Ambulatory Visit: Payer: Self-pay

## 2024-02-24 ENCOUNTER — Other Ambulatory Visit: Payer: Self-pay

## 2024-02-25 ENCOUNTER — Encounter (INDEPENDENT_AMBULATORY_CARE_PROVIDER_SITE_OTHER): Payer: Self-pay

## 2024-03-05 ENCOUNTER — Ambulatory Visit (INDEPENDENT_AMBULATORY_CARE_PROVIDER_SITE_OTHER): Payer: Medicaid Other | Admitting: Nurse Practitioner

## 2024-03-05 ENCOUNTER — Other Ambulatory Visit (INDEPENDENT_AMBULATORY_CARE_PROVIDER_SITE_OTHER): Payer: Medicaid Other

## 2024-03-06 ENCOUNTER — Other Ambulatory Visit: Payer: Self-pay

## 2024-03-10 ENCOUNTER — Other Ambulatory Visit (INDEPENDENT_AMBULATORY_CARE_PROVIDER_SITE_OTHER): Payer: Self-pay | Admitting: Vascular Surgery

## 2024-03-10 DIAGNOSIS — I739 Peripheral vascular disease, unspecified: Secondary | ICD-10-CM

## 2024-03-13 ENCOUNTER — Ambulatory Visit (INDEPENDENT_AMBULATORY_CARE_PROVIDER_SITE_OTHER): Admitting: Nurse Practitioner

## 2024-03-13 ENCOUNTER — Encounter (INDEPENDENT_AMBULATORY_CARE_PROVIDER_SITE_OTHER)

## 2024-03-18 ENCOUNTER — Other Ambulatory Visit: Payer: Self-pay

## 2024-03-18 MED ORDER — AMLODIPINE BESYLATE 10 MG PO TABS
10.0000 mg | ORAL_TABLET | Freq: Every day | ORAL | 3 refills | Status: AC
  Filled 2024-03-18: qty 90, 90d supply, fill #0
  Filled 2024-06-23 (×2): qty 90, 90d supply, fill #1
  Filled 2024-10-02: qty 90, 90d supply, fill #2

## 2024-03-23 ENCOUNTER — Other Ambulatory Visit: Payer: Self-pay

## 2024-04-20 ENCOUNTER — Other Ambulatory Visit: Payer: Self-pay

## 2024-04-20 MED ORDER — GABAPENTIN 100 MG PO CAPS
100.0000 mg | ORAL_CAPSULE | Freq: Every day | ORAL | 2 refills | Status: AC
  Filled 2024-04-20: qty 30, 30d supply, fill #0
  Filled 2024-06-23: qty 30, 30d supply, fill #1

## 2024-04-28 ENCOUNTER — Ambulatory Visit: Payer: MEDICAID

## 2024-04-28 ENCOUNTER — Other Ambulatory Visit: Payer: Self-pay

## 2024-04-28 ENCOUNTER — Encounter: Payer: Self-pay | Admitting: Podiatry

## 2024-04-28 ENCOUNTER — Other Ambulatory Visit: Payer: Self-pay | Admitting: Podiatry

## 2024-04-28 ENCOUNTER — Ambulatory Visit (INDEPENDENT_AMBULATORY_CARE_PROVIDER_SITE_OTHER): Payer: MEDICAID | Admitting: Podiatry

## 2024-04-28 VITALS — Ht 70.0 in | Wt 221.4 lb

## 2024-04-28 DIAGNOSIS — L0889 Other specified local infections of the skin and subcutaneous tissue: Secondary | ICD-10-CM

## 2024-04-28 DIAGNOSIS — M21619 Bunion of unspecified foot: Secondary | ICD-10-CM

## 2024-04-28 DIAGNOSIS — B353 Tinea pedis: Secondary | ICD-10-CM

## 2024-04-28 DIAGNOSIS — D492 Neoplasm of unspecified behavior of bone, soft tissue, and skin: Secondary | ICD-10-CM | POA: Diagnosis not present

## 2024-04-28 DIAGNOSIS — D2372 Other benign neoplasm of skin of left lower limb, including hip: Secondary | ICD-10-CM

## 2024-04-28 MED ORDER — CLOTRIMAZOLE-BETAMETHASONE 1-0.05 % EX CREA
1.0000 | TOPICAL_CREAM | Freq: Every day | CUTANEOUS | 2 refills | Status: DC
Start: 2024-04-28 — End: 2024-07-14
  Filled 2024-04-28 – 2024-05-15 (×2): qty 45, 45d supply, fill #0

## 2024-04-28 NOTE — Progress Notes (Signed)
 Chief Complaint  Patient presents with   Callouses    Pt is here due to bilateral callous on the bottom of his feet he states they are very painful and would like to have them shaved off.    Subjective: 53 y.o. 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  Overall significant improvement.  He has discontinued applying the salicylic acid and he removed the padding from the shoes because it was sloughing his skin off.  Past Medical History:  Diagnosis Date   Alcohol use    Carotid artery occlusion    GERD (gastroesophageal reflux disease)    Hypercholesteremia    Hypertension    Renal disorder    Seizures (HCC)    Stroke (HCC)    TIA 12/29/20, re-admitted 01/14/21 for sequelae   Tobacco abuse     Past Surgical History:  Procedure Laterality Date   DIALYSIS/PERMA CATHETER INSERTION N/A 07/02/2023   Procedure: DIALYSIS/PERMA CATHETER INSERTION;  Surgeon: Marea Selinda RAMAN, MD;  Location: [facility];  Service: Cardiovascular;  Laterality: N/A;   DIALYSIS/PERMA CATHETER REMOVAL Right 07/26/2023   Procedure: DIALYSIS/PERMA CATHETER REMOVAL;  Surgeon: Jama Cordella MATSU, MD;  Location: [facility];  Service: Cardiovascular;  Laterality: Right;   INTRAVASCULAR STENT INCLUDING PTA AND IMAGING Bilateral 10/2023   right leg   LOWER EXTREMITY ANGIOGRAPHY Right 10/24/2023   Procedure: Lower Extremity Angiography;  Surgeon: Marea Selinda RAMAN, MD;  Location: [facility];  Service: Cardiovascular;  Laterality: Right;   LOWER EXTREMITY ANGIOGRAPHY Left 11/07/2023   Procedure: Lower Extremity Angiography;  Surgeon: Marea Selinda RAMAN, MD;  Location: [facility];  Service: Cardiovascular;  Laterality: Left;   NO PAST SURGERIES      No Known Allergies   Objective:  Physical Exam General: Alert and oriented x3 in no acute distress  Dermatology: There continues to be hyperkeratotic  lesion(s) present on the plantar aspect of the left foot x 2. Pain on palpation with a central nucleated core noted. Skin is warm, dry and supple bilateral lower extremities. Negative for open lesions or macerations.  Vascular: No history of PVD.  Smoker.  1/3 ppd x 20+ yrs VAS US  ABI WITH/WO TBI 07/13/2021 ABI Findings:  +---------+------------------+-----+----------+--------+  Right   Rt Pressure (mmHg)IndexWaveform  Comment   +---------+------------------+-----+----------+--------+  Brachial 170                                        +---------+------------------+-----+----------+--------+  PTA     149               0.88 biphasic            +---------+------------------+-----+----------+--------+  DP      133               0.78 monophasic          +---------+------------------+-----+----------+--------+  Great Toe113               0.66 Abnormal            +---------+------------------+-----+----------+--------+   +---------+------------------+-----+----------+-------+  Left    Lt Pressure (mmHg)IndexWaveform  Comment  +---------+------------------+-----+----------+-------+  Brachial 170                                       +---------+------------------+-----+----------+-------+  PTA     131               0.77 biphasic           +---------+------------------+-----+----------+-------+  DP      116               0.68 monophasic         +---------+------------------+-----+----------+-------+  Great Toe98                0.58 Abnormal           +---------+------------------+-----+----------+-------+   +-------+-----------+-----------+------------+------------+  ABI/TBIToday's ABIToday's TBIPrevious ABIPrevious TBI  +-------+-----------+-----------+------------+------------+  Right 0.88       0.66       0.60        0.42          +-------+-----------+-----------+------------+------------+  Left  0.77       0.58        0.57        0.35          +-------+-----------+-----------+------------+------------+  Bilateral ABIs and TBIs appear increased.    Summary:  Right: Resting right ankle-brachial index indicates mild right lower extremity arterial disease. The right toe-brachial index is abnormal.   Left: Resting left ankle-brachial index indicates moderate left lower extremity arterial disease. The left toe-brachial index is abnormal.   Neurological: Grossly intact via light touch  Musculoskeletal Exam: Pain on palpation at the keratotic lesion(s) noted. Range of motion within normal limits bilateral. Muscle strength 5/5 in all groups bilateral.   Assessment: 1.  Symptomatic benign skin lesion x 2 left foot 2.  Suspicion for possible tinea pedis bilateral feet  Plan of Care:  -Patient evaluated - Again today excisional debridement of keratoic lesion(s) using a chisel blade was performed without incident.  -Salicylic acid applied with a bandaid - Continue OTC salicylic acid daily.  Provided. -Prescription for Lotrisone  cream apply twice daily to address any fungal element to the feet - Recent lower extremity angiography w/ [facility], Dr. Marea 10/24/2023  (RLE) and 10/31/2023 (LLE). -Good supportive tennis shoes and sneakers.  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 Thresa EMERSON Janit, DPM    [address] [place], [place] [address]                Office [phone]  Fax [phone]

## 2024-04-30 ENCOUNTER — Other Ambulatory Visit: Payer: Self-pay

## 2024-04-30 MED ORDER — LINZESS 145 MCG PO CAPS
145.0000 ug | ORAL_CAPSULE | Freq: Every day | ORAL | 1 refills | Status: DC
  Filled 2024-04-30 – 2024-05-15 (×2): qty 30, 30d supply, fill #0

## 2024-05-11 ENCOUNTER — Other Ambulatory Visit: Payer: Self-pay

## 2024-05-15 ENCOUNTER — Other Ambulatory Visit: Payer: Self-pay

## 2024-05-25 ENCOUNTER — Other Ambulatory Visit: Payer: Self-pay

## 2024-06-18 ENCOUNTER — Other Ambulatory Visit: Payer: Self-pay

## 2024-06-23 ENCOUNTER — Other Ambulatory Visit: Payer: Self-pay

## 2024-06-25 ENCOUNTER — Other Ambulatory Visit: Payer: Self-pay

## 2024-06-25 MED ORDER — SILDENAFIL CITRATE 100 MG PO TABS
100.0000 mg | ORAL_TABLET | Freq: Every day | ORAL | 0 refills | Status: AC | PRN
  Filled 2024-06-25: qty 10, 10d supply, fill #0

## 2024-07-06 ENCOUNTER — Other Ambulatory Visit: Payer: Self-pay

## 2024-07-06 MED ORDER — ONDANSETRON HCL 4 MG PO TABS
4.0000 mg | ORAL_TABLET | Freq: Three times a day (TID) | ORAL | 0 refills | Status: AC | PRN
  Filled 2024-07-06: qty 20, 7d supply, fill #0

## 2024-07-06 MED ORDER — PANTOPRAZOLE SODIUM 40 MG PO TBEC
40.0000 mg | DELAYED_RELEASE_TABLET | Freq: Every day | ORAL | 2 refills | Status: AC
  Filled 2024-07-06 – 2024-07-29 (×2): qty 30, 30d supply, fill #0
  Filled 2024-10-02: qty 30, 30d supply, fill #1
  Filled 2024-11-11: qty 30, 30d supply, fill #2

## 2024-07-14 ENCOUNTER — Ambulatory Visit: Payer: MEDICAID | Admitting: Podiatry

## 2024-07-14 ENCOUNTER — Other Ambulatory Visit: Payer: Self-pay

## 2024-07-14 DIAGNOSIS — D2372 Other benign neoplasm of skin of left lower limb, including hip: Secondary | ICD-10-CM

## 2024-07-14 DIAGNOSIS — D492 Neoplasm of unspecified behavior of bone, soft tissue, and skin: Secondary | ICD-10-CM | POA: Diagnosis not present

## 2024-07-14 MED ORDER — CLOTRIMAZOLE-BETAMETHASONE 1-0.05 % EX CREA
1.0000 | TOPICAL_CREAM | Freq: Every day | CUTANEOUS | 2 refills | Status: AC
  Filled 2024-07-14: qty 45, 45d supply, fill #0

## 2024-07-14 NOTE — Progress Notes (Signed)
 Chief Complaint  Patient presents with   Callouses    Bilateral callouses. Uncomfortable to walk    Subjective: 53 y.o. 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  Overall significant improvement.  He has discontinued applying the salicylic acid and he removed the padding from the shoes because it was sloughing his skin off.  Past Medical History:  Diagnosis Date   Alcohol use    Carotid artery occlusion    GERD (gastroesophageal reflux disease)    Hypercholesteremia    Hypertension    Renal disorder    Seizures (HCC)    Stroke (HCC)    TIA 12/29/20, re-admitted 01/14/21 for sequelae   Tobacco abuse     Past Surgical History:  Procedure Laterality Date   DIALYSIS/PERMA CATHETER INSERTION N/A 07/02/2023   Procedure: DIALYSIS/PERMA CATHETER INSERTION;  Surgeon: Marea Selinda RAMAN, MD;  Location: [facility];  Service: Cardiovascular;  Laterality: N/A;   DIALYSIS/PERMA CATHETER REMOVAL Right 07/26/2023   Procedure: DIALYSIS/PERMA CATHETER REMOVAL;  Surgeon: Jama Cordella MATSU, MD;  Location: [facility];  Service: Cardiovascular;  Laterality: Right;   INTRAVASCULAR STENT INCLUDING PTA AND IMAGING Bilateral 10/2023   right leg   LOWER EXTREMITY ANGIOGRAPHY Right 10/24/2023   Procedure: Lower Extremity Angiography;  Surgeon: Marea Selinda RAMAN, MD;  Location: [facility];  Service: Cardiovascular;  Laterality: Right;   LOWER EXTREMITY ANGIOGRAPHY Left 11/07/2023   Procedure: Lower Extremity Angiography;  Surgeon: Marea Selinda RAMAN, MD;  Location: [facility];  Service: Cardiovascular;  Laterality: Left;   NO PAST SURGERIES      No Known Allergies   Objective:  Physical Exam General: Alert and oriented x3 in no acute distress  Dermatology: There continues to be hyperkeratotic lesion(s) present on the plantar aspect of the left foot x 2. Pain on palpation with a central  nucleated core noted. Skin is warm, dry and supple bilateral lower extremities. Negative for open lesions or macerations.  Vascular: No history of PVD.  Smoker.  1/3 ppd x 20+ yrs VAS US  ABI WITH/WO TBI 07/13/2021 ABI Findings:  +---------+------------------+-----+----------+--------+  Right   Rt Pressure (mmHg)IndexWaveform  Comment   +---------+------------------+-----+----------+--------+  Brachial 170                                        +---------+------------------+-----+----------+--------+  PTA     149               0.88 biphasic            +---------+------------------+-----+----------+--------+  DP      133               0.78 monophasic          +---------+------------------+-----+----------+--------+  Great Toe113               0.66 Abnormal            +---------+------------------+-----+----------+--------+   +---------+------------------+-----+----------+-------+  Left    Lt Pressure (mmHg)IndexWaveform  Comment  +---------+------------------+-----+----------+-------+  Brachial 170                                       +---------+------------------+-----+----------+-------+  PTA     131  0.77 biphasic           +---------+------------------+-----+----------+-------+  DP      116               0.68 monophasic         +---------+------------------+-----+----------+-------+  Great Toe98                0.58 Abnormal           +---------+------------------+-----+----------+-------+   +-------+-----------+-----------+------------+------------+  ABI/TBIToday's ABIToday's TBIPrevious ABIPrevious TBI  +-------+-----------+-----------+------------+------------+  Right 0.88       0.66       0.60        0.42          +-------+-----------+-----------+------------+------------+  Left  0.77       0.58       0.57        0.35          +-------+-----------+-----------+------------+------------+   Bilateral ABIs and TBIs appear increased.    Summary:  Right: Resting right ankle-brachial index indicates mild right lower extremity arterial disease. The right toe-brachial index is abnormal.   Left: Resting left ankle-brachial index indicates moderate left lower extremity arterial disease. The left toe-brachial index is abnormal.   Neurological: Grossly intact via light touch  Musculoskeletal Exam: Pain on palpation at the keratotic lesion(s) noted. Range of motion within normal limits bilateral. Muscle strength 5/5 in all groups bilateral.   Assessment: 1.  Symptomatic benign skin lesion x 2 left foot 2.  Suspicion for possible tinea pedis bilateral feet  Plan of Care:  -Patient evaluated - Again today excisional debridement of keratoic lesion(s) using a chisel blade was performed without incident.  -Salicylic acid applied with a bandaid - Continue OTC salicylic acid daily.  Provided. -Prescription for Lotrisone  cream apply twice daily to address any fungal element to the feet - Recent lower extremity angiography w/ [facility], Dr. Marea 10/24/2023  (RLE) and 10/31/2023 (LLE). -Good supportive tennis shoes and sneakers.  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 Thresa EMERSON Janit, DPM    [address] [place], [place] [address]                Office [phone]  Fax [phone]

## 2024-07-16 ENCOUNTER — Other Ambulatory Visit: Payer: Self-pay

## 2024-07-27 ENCOUNTER — Other Ambulatory Visit: Payer: Self-pay

## 2024-07-28 ENCOUNTER — Other Ambulatory Visit: Payer: Self-pay

## 2024-07-29 ENCOUNTER — Other Ambulatory Visit: Payer: Self-pay

## 2024-07-30 ENCOUNTER — Other Ambulatory Visit: Payer: Self-pay

## 2024-07-30 MED ORDER — CLOPIDOGREL BISULFATE 75 MG PO TABS
75.0000 mg | ORAL_TABLET | Freq: Every day | ORAL | 1 refills | Status: AC
  Filled 2024-07-30: qty 90, 90d supply, fill #0
  Filled 2024-11-11: qty 90, 90d supply, fill #1

## 2024-09-22 ENCOUNTER — Other Ambulatory Visit: Payer: Self-pay

## 2024-10-02 ENCOUNTER — Other Ambulatory Visit: Payer: Self-pay

## 2024-10-02 MED ORDER — LOSARTAN POTASSIUM 100 MG PO TABS
100.0000 mg | ORAL_TABLET | Freq: Every morning | ORAL | 1 refills | Status: AC
  Filled 2024-10-02: qty 90, 90d supply, fill #0

## 2024-10-02 MED ORDER — FOLIC ACID 1 MG PO TABS
1.0000 mg | ORAL_TABLET | Freq: Every day | ORAL | 1 refills | Status: AC
  Filled 2024-10-02: qty 90, 90d supply, fill #0

## 2024-10-30 ENCOUNTER — Ambulatory Visit: Payer: MEDICAID | Admitting: Podiatry

## 2024-10-30 DIAGNOSIS — D2372 Other benign neoplasm of skin of left lower limb, including hip: Secondary | ICD-10-CM

## 2024-10-30 NOTE — Progress Notes (Signed)
 "  Chief Complaint  Patient presents with   Callouses    Bilateral callous removal.    Subjective: 54 y.o. 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  Overall significant improvement.  He has discontinued applying the salicylic acid and he removed the padding from the shoes because it was sloughing his skin off.  Past Medical History:  Diagnosis Date   Alcohol use    Carotid artery occlusion    GERD (gastroesophageal reflux disease)    Hypercholesteremia    Hypertension    Renal disorder    Seizures (HCC)    Stroke (HCC)    TIA 12/29/20, re-admitted 01/14/21 for sequelae   Tobacco abuse     Past Surgical History:  Procedure Laterality Date   DIALYSIS/PERMA CATHETER INSERTION N/A 07/02/2023   Procedure: DIALYSIS/PERMA CATHETER INSERTION;  Surgeon: Marea Selinda RAMAN, MD;  Location: [facility];  Service: Cardiovascular;  Laterality: N/A;   DIALYSIS/PERMA CATHETER REMOVAL Right 07/26/2023   Procedure: DIALYSIS/PERMA CATHETER REMOVAL;  Surgeon: Jama Cordella MATSU, MD;  Location: [facility];  Service: Cardiovascular;  Laterality: Right;   INTRAVASCULAR STENT INCLUDING PTA AND IMAGING Bilateral 10/2023   right leg   LOWER EXTREMITY ANGIOGRAPHY Right 10/24/2023   Procedure: Lower Extremity Angiography;  Surgeon: Marea Selinda RAMAN, MD;  Location: [facility];  Service: Cardiovascular;  Laterality: Right;   LOWER EXTREMITY ANGIOGRAPHY Left 11/07/2023   Procedure: Lower Extremity Angiography;  Surgeon: Marea Selinda RAMAN, MD;  Location: [facility];  Service: Cardiovascular;  Laterality: Left;   NO PAST SURGERIES      No Known Allergies   Objective:  Physical Exam General: Alert and oriented x3 in no acute distress  Dermatology: There continues to be hyperkeratotic lesion(s) present on the plantar aspect of the left foot x 2. Pain on palpation with a central nucleated core  noted. Skin is warm, dry and supple bilateral lower extremities. Negative for open lesions or macerations.  Vascular: No history of PVD.  Smoker.  1/3 ppd x 20+ yrs VAS US  ABI WITH/WO TBI 07/13/2021 ABI Findings:  +---------+------------------+-----+----------+--------+  Right   Rt Pressure (mmHg)IndexWaveform  Comment   +---------+------------------+-----+----------+--------+  Brachial 170                                        +---------+------------------+-----+----------+--------+  PTA     149               0.88 biphasic            +---------+------------------+-----+----------+--------+  DP      133               0.78 monophasic          +---------+------------------+-----+----------+--------+  Great Toe113               0.66 Abnormal            +---------+------------------+-----+----------+--------+   +---------+------------------+-----+----------+-------+  Left    Lt Pressure (mmHg)IndexWaveform  Comment  +---------+------------------+-----+----------+-------+  Brachial 170                                       +---------+------------------+-----+----------+-------+  PTA     131  0.77 biphasic           +---------+------------------+-----+----------+-------+  DP      116               0.68 monophasic         +---------+------------------+-----+----------+-------+  Great Toe98                0.58 Abnormal           +---------+------------------+-----+----------+-------+   +-------+-----------+-----------+------------+------------+  ABI/TBIToday's ABIToday's TBIPrevious ABIPrevious TBI  +-------+-----------+-----------+------------+------------+  Right 0.88       0.66       0.60        0.42          +-------+-----------+-----------+------------+------------+  Left  0.77       0.58       0.57        0.35          +-------+-----------+-----------+------------+------------+  Bilateral ABIs  and TBIs appear increased.    Summary:  Right: Resting right ankle-brachial index indicates mild right lower extremity arterial disease. The right toe-brachial index is abnormal.   Left: Resting left ankle-brachial index indicates moderate left lower extremity arterial disease. The left toe-brachial index is abnormal.   Neurological: Grossly intact via light touch  Musculoskeletal Exam: Pain on palpation at the keratotic lesion(s) noted. Range of motion within normal limits bilateral. Muscle strength 5/5 in all groups bilateral.   Assessment: 1.  Symptomatic benign skin lesion x 2 left foot 2.  Suspicion for possible tinea pedis bilateral feet  Plan of Care:  -Patient evaluated - Again today excisional debridement of keratoic lesion(s) using a chisel blade was performed without incident.  -Salicylic acid applied with a bandaid - Continue OTC salicylic acid daily.  Provided. -Prescription for Lotrisone  cream apply twice daily to address any fungal element to the feet - Recent lower extremity angiography w/ [facility], Dr. Marea 10/24/2023  (RLE) and 10/31/2023 (LLE). -Good supportive tennis shoes and sneakers.  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 Thresa EMERSON Janit, DPM    [address] [place], [place] [address]                Office [phone]  Fax [phone]     "

## 2024-11-11 ENCOUNTER — Other Ambulatory Visit: Payer: Self-pay

## 2024-12-01 ENCOUNTER — Ambulatory Visit: Payer: MEDICAID | Admitting: Podiatry
